# Patient Record
Sex: Female | Born: 1985 | Race: Black or African American | Hispanic: No | Smoking: Current some day smoker
Health system: Southern US, Community
[De-identification: ages and names within clinical notes are randomized; demographics above are authoritative.]

## PROBLEM LIST (undated history)

## (undated) ENCOUNTER — Inpatient Hospital Stay (HOSPITAL_COMMUNITY): Payer: Self-pay

## (undated) DIAGNOSIS — F32A Depression, unspecified: Secondary | ICD-10-CM

## (undated) DIAGNOSIS — K08409 Partial loss of teeth, unspecified cause, unspecified class: Secondary | ICD-10-CM

## (undated) DIAGNOSIS — K8020 Calculus of gallbladder without cholecystitis without obstruction: Secondary | ICD-10-CM

## (undated) DIAGNOSIS — I10 Essential (primary) hypertension: Secondary | ICD-10-CM

## (undated) DIAGNOSIS — G43909 Migraine, unspecified, not intractable, without status migrainosus: Secondary | ICD-10-CM

## (undated) DIAGNOSIS — T8859XA Other complications of anesthesia, initial encounter: Secondary | ICD-10-CM

## (undated) DIAGNOSIS — G932 Benign intracranial hypertension: Secondary | ICD-10-CM

## (undated) DIAGNOSIS — F419 Anxiety disorder, unspecified: Secondary | ICD-10-CM

## (undated) DIAGNOSIS — D649 Anemia, unspecified: Secondary | ICD-10-CM

## (undated) DIAGNOSIS — Z862 Personal history of diseases of the blood and blood-forming organs and certain disorders involving the immune mechanism: Secondary | ICD-10-CM

## (undated) HISTORY — PX: CHOLECYSTECTOMY: SHX55

## (undated) HISTORY — DX: Calculus of gallbladder without cholecystitis without obstruction: K80.20

## (undated) HISTORY — PX: WISDOM TOOTH EXTRACTION: SHX21

---
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
## 2006-10-22 ENCOUNTER — Inpatient Hospital Stay (HOSPITAL_COMMUNITY): Admission: AD | Admit: 2006-10-22 | Discharge: 2006-10-22 | Payer: Self-pay | Admitting: Obstetrics and Gynecology

## 2006-10-25 ENCOUNTER — Inpatient Hospital Stay (HOSPITAL_COMMUNITY): Admission: AD | Admit: 2006-10-25 | Discharge: 2006-10-25 | Payer: Self-pay | Admitting: Obstetrics and Gynecology

## 2006-12-11 ENCOUNTER — Ambulatory Visit (HOSPITAL_COMMUNITY): Admission: RE | Admit: 2006-12-11 | Discharge: 2006-12-11 | Payer: Self-pay | Admitting: Obstetrics & Gynecology

## 2007-02-20 ENCOUNTER — Inpatient Hospital Stay (HOSPITAL_COMMUNITY): Admission: AD | Admit: 2007-02-20 | Discharge: 2007-02-20 | Payer: Self-pay | Admitting: Obstetrics and Gynecology

## 2007-02-27 ENCOUNTER — Ambulatory Visit (HOSPITAL_COMMUNITY): Admission: RE | Admit: 2007-02-27 | Discharge: 2007-02-27 | Payer: Self-pay | Admitting: Obstetrics & Gynecology

## 2007-04-01 ENCOUNTER — Inpatient Hospital Stay (HOSPITAL_COMMUNITY): Admission: AD | Admit: 2007-04-01 | Discharge: 2007-04-01 | Payer: Self-pay | Admitting: Obstetrics & Gynecology

## 2007-04-18 ENCOUNTER — Inpatient Hospital Stay (HOSPITAL_COMMUNITY): Admission: AD | Admit: 2007-04-18 | Discharge: 2007-04-18 | Payer: Self-pay | Admitting: Obstetrics & Gynecology

## 2007-05-09 ENCOUNTER — Inpatient Hospital Stay (HOSPITAL_COMMUNITY): Admission: AD | Admit: 2007-05-09 | Discharge: 2007-05-12 | Payer: Self-pay | Admitting: Obstetrics

## 2007-12-25 ENCOUNTER — Emergency Department (HOSPITAL_COMMUNITY): Admission: EM | Admit: 2007-12-25 | Discharge: 2007-12-25 | Payer: Self-pay | Admitting: Emergency Medicine

## 2008-11-03 ENCOUNTER — Inpatient Hospital Stay (HOSPITAL_COMMUNITY): Admission: AD | Admit: 2008-11-03 | Discharge: 2008-11-03 | Payer: Self-pay | Admitting: Obstetrics & Gynecology

## 2009-03-13 ENCOUNTER — Inpatient Hospital Stay (HOSPITAL_COMMUNITY): Admission: AD | Admit: 2009-03-13 | Discharge: 2009-03-15 | Payer: Self-pay | Admitting: Obstetrics & Gynecology

## 2009-03-13 ENCOUNTER — Ambulatory Visit: Payer: Self-pay | Admitting: Obstetrics & Gynecology

## 2010-03-06 ENCOUNTER — Emergency Department (HOSPITAL_COMMUNITY): Admission: EM | Admit: 2010-03-06 | Discharge: 2010-03-07 | Payer: Self-pay | Admitting: Emergency Medicine

## 2010-04-07 ENCOUNTER — Emergency Department (HOSPITAL_COMMUNITY)
Admission: EM | Admit: 2010-04-07 | Discharge: 2010-04-07 | Payer: Self-pay | Source: Home / Self Care | Admitting: Emergency Medicine

## 2010-07-12 LAB — URINE CULTURE

## 2010-07-12 LAB — URINALYSIS, ROUTINE W REFLEX MICROSCOPIC
Ketones, ur: NEGATIVE mg/dL
Nitrite: NEGATIVE
Protein, ur: NEGATIVE mg/dL
Specific Gravity, Urine: 1.035 — ABNORMAL HIGH (ref 1.005–1.030)
pH: 6 (ref 5.0–8.0)

## 2010-07-12 LAB — URINE MICROSCOPIC-ADD ON

## 2010-07-19 ENCOUNTER — Emergency Department (HOSPITAL_COMMUNITY): Payer: Medicaid Other

## 2010-07-19 ENCOUNTER — Emergency Department (HOSPITAL_COMMUNITY)
Admission: EM | Admit: 2010-07-19 | Discharge: 2010-07-20 | Disposition: A | Discharge status: Home / Self Care | Payer: Medicaid Other | Attending: Emergency Medicine | Admitting: Emergency Medicine

## 2010-07-19 DIAGNOSIS — S81009A Unspecified open wound, unspecified knee, initial encounter: Secondary | ICD-10-CM | POA: Insufficient documentation

## 2010-07-19 DIAGNOSIS — L02419 Cutaneous abscess of limb, unspecified: Secondary | ICD-10-CM | POA: Insufficient documentation

## 2010-07-19 DIAGNOSIS — IMO0001 Reserved for inherently not codable concepts without codable children: Secondary | ICD-10-CM | POA: Insufficient documentation

## 2010-07-19 DIAGNOSIS — L03119 Cellulitis of unspecified part of limb: Secondary | ICD-10-CM | POA: Insufficient documentation

## 2010-07-19 DIAGNOSIS — Y9289 Other specified places as the place of occurrence of the external cause: Secondary | ICD-10-CM | POA: Insufficient documentation

## 2010-08-03 LAB — CBC
HCT: 30 % — ABNORMAL LOW (ref 36.0–46.0)
HCT: 32.4 % — ABNORMAL LOW (ref 36.0–46.0)
Hemoglobin: 9.9 g/dL — ABNORMAL LOW (ref 12.0–15.0)
MCV: 82 fL (ref 78.0–100.0)
MCV: 83 fL (ref 78.0–100.0)
RBC: 3.62 MIL/uL — ABNORMAL LOW (ref 3.87–5.11)
WBC: 10.7 10*3/uL — ABNORMAL HIGH (ref 4.0–10.5)
WBC: 9.1 10*3/uL (ref 4.0–10.5)

## 2010-08-03 LAB — CARDIAC PANEL(CRET KIN+CKTOT+MB+TROPI)
CK, MB: 2 ng/mL (ref 0.3–4.0)
Relative Index: 1.7 (ref 0.0–2.5)
Relative Index: 2.5 (ref 0.0–2.5)
Total CK: 117 U/L (ref 7–177)
Troponin I: 0.03 ng/mL (ref 0.00–0.06)
Troponin I: 0.06 ng/mL (ref 0.00–0.06)

## 2010-08-03 LAB — RPR: RPR Ser Ql: NONREACTIVE

## 2010-08-07 LAB — POCT PREGNANCY, URINE: Preg Test, Ur: POSITIVE

## 2010-08-07 LAB — WET PREP, GENITAL: Yeast Wet Prep HPF POC: NONE SEEN

## 2010-08-07 LAB — GC/CHLAMYDIA PROBE AMP, GENITAL: Chlamydia, DNA Probe: POSITIVE — AB

## 2010-08-26 ENCOUNTER — Observation Stay (HOSPITAL_COMMUNITY)
Admission: EM | Admit: 2010-08-26 | Discharge: 2010-08-27 | Disposition: A | Discharge status: Home / Self Care | Payer: Medicaid Other | Source: Ambulatory Visit | Attending: Emergency Medicine | Admitting: Emergency Medicine

## 2010-08-26 DIAGNOSIS — IMO0001 Reserved for inherently not codable concepts without codable children: Secondary | ICD-10-CM | POA: Insufficient documentation

## 2010-08-26 DIAGNOSIS — F329 Major depressive disorder, single episode, unspecified: Secondary | ICD-10-CM | POA: Insufficient documentation

## 2010-08-26 DIAGNOSIS — L02419 Cutaneous abscess of limb, unspecified: Principal | ICD-10-CM | POA: Insufficient documentation

## 2010-08-26 DIAGNOSIS — I10 Essential (primary) hypertension: Secondary | ICD-10-CM | POA: Insufficient documentation

## 2010-08-26 DIAGNOSIS — L03119 Cellulitis of unspecified part of limb: Secondary | ICD-10-CM | POA: Insufficient documentation

## 2010-08-26 DIAGNOSIS — F1010 Alcohol abuse, uncomplicated: Secondary | ICD-10-CM | POA: Insufficient documentation

## 2010-08-26 DIAGNOSIS — F3289 Other specified depressive episodes: Secondary | ICD-10-CM | POA: Insufficient documentation

## 2010-08-26 LAB — DIFFERENTIAL
Lymphs Abs: 3.7 10*3/uL (ref 0.7–4.0)
Monocytes Absolute: 0.6 10*3/uL (ref 0.1–1.0)
Neutro Abs: 6.5 10*3/uL (ref 1.7–7.7)
Neutrophils Relative %: 59 % (ref 43–77)

## 2010-08-26 LAB — POCT I-STAT, CHEM 8
Creatinine, Ser: 0.8 mg/dL (ref 0.4–1.2)
HCT: 34 % — ABNORMAL LOW (ref 36.0–46.0)
Hemoglobin: 11.6 g/dL — ABNORMAL LOW (ref 12.0–15.0)
Potassium: 3.6 mEq/L (ref 3.5–5.1)
TCO2: 25 mmol/L (ref 0–100)

## 2010-08-26 LAB — CBC
MCH: 27 pg (ref 26.0–34.0)
MCHC: 33.5 g/dL (ref 30.0–36.0)
MCV: 80.6 fL (ref 78.0–100.0)
Platelets: 254 10*3/uL (ref 150–400)
RDW: 13.9 % (ref 11.5–15.5)
WBC: 10.9 10*3/uL — ABNORMAL HIGH (ref 4.0–10.5)

## 2010-08-29 LAB — WOUND CULTURE: Gram Stain: NONE SEEN

## 2010-08-30 ENCOUNTER — Emergency Department (HOSPITAL_COMMUNITY)
Admission: EM | Admit: 2010-08-30 | Discharge: 2010-08-30 | Disposition: A | Discharge status: Home / Self Care | Payer: Medicaid Other | Attending: Emergency Medicine | Admitting: Emergency Medicine

## 2010-08-30 DIAGNOSIS — IMO0001 Reserved for inherently not codable concepts without codable children: Secondary | ICD-10-CM | POA: Insufficient documentation

## 2010-08-30 DIAGNOSIS — L089 Local infection of the skin and subcutaneous tissue, unspecified: Secondary | ICD-10-CM | POA: Insufficient documentation

## 2010-08-30 DIAGNOSIS — T148XXA Other injury of unspecified body region, initial encounter: Secondary | ICD-10-CM | POA: Insufficient documentation

## 2010-09-16 NOTE — Discharge Summary (Signed)
NAME:  Tabitha Holland, Tabitha Holland                      ACCOUNT NO.:  [id number]   MEDICAL RECORD NO.:  [id number]                   PATIENT TYPE:  INP   LOCATION:  0104                                 FACILITY:  [facility]   PHYSICIAN:  Cindie Crumbly, M.D.               DATE OF BIRTH:  January 28, 1986   DATE OF ADMISSION:  08/29/2002  DATE OF DISCHARGE:  09/05/2002                                 DISCHARGE SUMMARY   REASON FOR ADMISSION:  This 25 year old African-American female was admitted  for inpatient psychiatric stabilization because of increasing symptoms of  depression and threats to kill herself.  For further history of present  illness, please the patient's psychiatric admission assessment.   PHYSICAL EXAMINATION:  At the time of admission was significant for obesity.  She also has a history of being status post cholecystectomy.  She had an  otherwise unremarkable physical examination.   LABORATORY DATA:  The patient underwent a laboratory workup to rule out any  medical problems contributing to her symptomatology.  Hepatic panel showed  albumin 3.2 and was otherwise unremarkable.  A urine probe for gonorrhea and  chlamydia were negative.  Basic metabolic panel was within normal limits.  CBC showed RDW of 14.8% and was otherwise unremarkable.  Free T4 and TSH  were within normal limits and RPR was nonreactive.  GGT was within normal  limits.  Urine drug screen was positive for barbiturates.  Urine pregnancy  test was negative.  The patient received no x-rays, no special procedures,  no additional consultations.  She sustained no complications during the  course of this hospitalization.   HOSPITAL COURSE:  On admission, the patient was psychomotor agitated.  Affect and mood were depressed, irritable and angry.  She was oppositional  and defiant with poor impulse control.  She was begun on a trial of Effexor  XR and titrated up to a therapeutic dose.  She had shown no improvement on  Zoloft.  This was downward and discontinued.  At the time of discharge, she  denies any suicidal or homicidal ideation.  Affect and mood have improved.  Her concentration is increased.  She is actively participating in all  aspects of the therapeutic treatment program.  Consequently, it is felt she  has reached her maximum benefits of hospitalization and is ready for  discharge to a less restrictive alternative setting.   CONDITION ON DISCHARGE:  Improved.  She has displayed no evidence of thought  disorder throughout her hospital course.   DIAGNOSES (ACCORDING TO DSM-IV):   AXIS I:  Major depression, recurrent-type, severe without psychosis.   AXIS II:  None.   AXIS III:  Morbid obesity.   AXIS IV:  Current psychosocial stressors are severe.   AXIS V:  20 on admission; 30 on discharge.   FURTHER EVALUATION AND TREATMENT RECOMMENDATIONS:  1. The patient is discharged home.  2. She is discharged on an unrestricted level of activity  and a regular     diet.  3. She is discharged on Effexor XR 150 mg p.o. q.a.m.  4. She will follow up at the [facility] with her outpatient     psychiatrist, Dr. Kirtland Bouchard, for all further aspects of her psychiatric     care and, consequently, I will sign off on the case at this time.  She     will follow up with her primary care physician for all further aspects of     her medical care.                                               Cindie Crumbly, M.D.    TS/MEDQ  D:  09/05/2002  T:  09/05/2002  Job:  [id number]
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
## 2010-09-16 NOTE — Op Note (Signed)
[facility]  Patient:    Tabitha Holland, Tabitha Holland                   MRN: [id number] Proc. Date: 11/27/00 Adm. Date:  [id number] Attending:  Tempie Donning                           Operative Report  PREOPERATIVE DIAGNOSIS:  Cholecystitis, sludge, no stones on ultrasound.  POSTOPERATIVE DIAGNOSIS:  Cholecystitis, sludge, no stones on ultrasound.  OPERATION PERFORMED:  Laparoscopic cholecystectomy.  SURGEON:  Gita Kudo, M.D.  ASSISTANT:  Earna Coder, M.D.  ANESTHESIA:  General endotracheal.  INDICATIONS FOR PROCEDURE:  The patient is a 25 year old female with bouts of abdominal pain.  Gallbladder ultrasound showed stones and possible polyps. Her liver function studies are normal.  They do not know her family history because she is adopted.  OPERATIVE FINDINGS:  The gallbladder was thin-walled, had no palpable stones. Cystic duct and artery were normal in size and anatomy and cholangiogram not taken.  DESCRIPTION OF PROCEDURE:  Under satisfactory general endotracheal anesthesia, having received 1.0 gm Ancef preop, the patients abdomen was prepped and draped in standard fashion.  A transverse infraumbilical incision was made and carried down to the midline into the peritoneum.  Controlled with a figure-of-eight 0 Vicryl suture and operating Hasson inserted and secured with CO2 pneumoperitoneum established and camera placed.  Under direct vision, with Marcaine infiltrated skin sites, two #5 ports placed laterally and a second #10 port medially.  Graspers through the lateral port gave excellent exposure and operating through the medial port, I carefully dissected the gallbladder cystic duct junction.  When certain of the anatomy, having dissected with a wide angle clamp circumferentially, the duct was controlled with multiple metal clips and divided between the distal two.  The same procedure done on the cystic artery.  The gallbladder  then removed from below upward using coagulating spatula for hemostasis and dissection.  After the gallbladder was amputated, the liver bed was checked for hemostasis which was good and it was lavaged with saline and suctioned dry.  The camera moved to the upper port and large grasper placed through the umbilical port and used to retrieve the gallbladder intact, without spillage or complication.  The operative site was again checked and then trocars removed under direct vision and CO2 released.  Following this, the midline incision was infiltrated with Marcaine and then closed with a previously placed figure-of-eight suture as well as two extra interrupted 0 Vicryl sutures.  The subcutaneous was reapproximated with 4-0 Vicryl and skin with Steri-Strips.  Sterile absorbent dressings applied.  The patient was taken to the recovery room from the operating room in good condition without complication. DD:  11/27/00 TD:  11/27/00 Job: [id number]

## 2010-10-15 ENCOUNTER — Emergency Department (HOSPITAL_COMMUNITY)
Admission: EM | Admit: 2010-10-15 | Discharge: 2010-10-15 | Disposition: A | Discharge status: Home / Self Care | Payer: No Typology Code available for payment source | Attending: Emergency Medicine | Admitting: Emergency Medicine

## 2010-10-15 DIAGNOSIS — IMO0002 Reserved for concepts with insufficient information to code with codable children: Secondary | ICD-10-CM | POA: Insufficient documentation

## 2010-10-15 DIAGNOSIS — I10 Essential (primary) hypertension: Secondary | ICD-10-CM | POA: Insufficient documentation

## 2011-01-19 LAB — CBC
HCT: 32.7 — ABNORMAL LOW
Hemoglobin: 10.3 — ABNORMAL LOW
MCHC: 34.9
MCV: 86.8
Platelets: 212
RDW: 14.9
RDW: 15.2

## 2011-01-19 LAB — COMPREHENSIVE METABOLIC PANEL
Alkaline Phosphatase: 177 — ABNORMAL HIGH
BUN: 6
Calcium: 8.8
Creatinine, Ser: 0.52
Glucose, Bld: 89
Potassium: 3.3 — ABNORMAL LOW
Total Protein: 5.9 — ABNORMAL LOW

## 2011-01-19 LAB — LACTATE DEHYDROGENASE: LDH: 130

## 2011-01-19 LAB — URIC ACID: Uric Acid, Serum: 4.4

## 2011-01-28 ENCOUNTER — Emergency Department (HOSPITAL_COMMUNITY)
Admission: EM | Admit: 2011-01-28 | Discharge: 2011-01-28 | Disposition: A | Discharge status: Home / Self Care | Payer: Medicaid Other | Attending: Emergency Medicine | Admitting: Emergency Medicine

## 2011-01-28 ENCOUNTER — Emergency Department (HOSPITAL_COMMUNITY): Payer: Medicaid Other

## 2011-01-28 DIAGNOSIS — R0609 Other forms of dyspnea: Secondary | ICD-10-CM | POA: Insufficient documentation

## 2011-01-28 DIAGNOSIS — R079 Chest pain, unspecified: Secondary | ICD-10-CM | POA: Insufficient documentation

## 2011-01-28 DIAGNOSIS — J40 Bronchitis, not specified as acute or chronic: Secondary | ICD-10-CM | POA: Insufficient documentation

## 2011-01-28 DIAGNOSIS — R0989 Other specified symptoms and signs involving the circulatory and respiratory systems: Secondary | ICD-10-CM | POA: Insufficient documentation

## 2011-01-28 DIAGNOSIS — R05 Cough: Secondary | ICD-10-CM | POA: Insufficient documentation

## 2011-01-28 DIAGNOSIS — R197 Diarrhea, unspecified: Secondary | ICD-10-CM | POA: Insufficient documentation

## 2011-01-28 DIAGNOSIS — M940 Chondrocostal junction syndrome [Tietze]: Secondary | ICD-10-CM | POA: Insufficient documentation

## 2011-01-28 DIAGNOSIS — R509 Fever, unspecified: Secondary | ICD-10-CM | POA: Insufficient documentation

## 2011-01-28 DIAGNOSIS — R059 Cough, unspecified: Secondary | ICD-10-CM | POA: Insufficient documentation

## 2011-01-28 DIAGNOSIS — E669 Obesity, unspecified: Secondary | ICD-10-CM | POA: Insufficient documentation

## 2011-01-28 LAB — DIFFERENTIAL
Lymphocytes Relative: 33 % (ref 12–46)
Lymphs Abs: 2.8 10*3/uL (ref 0.7–4.0)
Neutrophils Relative %: 60 % (ref 43–77)

## 2011-01-28 LAB — POCT I-STAT, CHEM 8
Chloride: 103 mEq/L (ref 96–112)
Glucose, Bld: 91 mg/dL (ref 70–99)
HCT: 32 % — ABNORMAL LOW (ref 36.0–46.0)
Potassium: 3.6 mEq/L (ref 3.5–5.1)

## 2011-01-28 LAB — CBC
HCT: 30.6 % — ABNORMAL LOW (ref 36.0–46.0)
Hemoglobin: 10.2 g/dL — ABNORMAL LOW (ref 12.0–15.0)
MCV: 81.8 fL (ref 78.0–100.0)
RBC: 3.74 MIL/uL — ABNORMAL LOW (ref 3.87–5.11)
WBC: 8.3 10*3/uL (ref 4.0–10.5)

## 2011-02-06 LAB — URINE MICROSCOPIC-ADD ON

## 2011-02-06 LAB — URINALYSIS, ROUTINE W REFLEX MICROSCOPIC
Glucose, UA: NEGATIVE
Hgb urine dipstick: NEGATIVE
Specific Gravity, Urine: 1.02

## 2011-02-08 LAB — URINE CULTURE

## 2011-02-08 LAB — URINE MICROSCOPIC-ADD ON

## 2011-02-08 LAB — URINALYSIS, ROUTINE W REFLEX MICROSCOPIC
Bilirubin Urine: NEGATIVE
Glucose, UA: NEGATIVE
Ketones, ur: NEGATIVE
Nitrite: NEGATIVE
Protein, ur: NEGATIVE

## 2011-02-15 LAB — WET PREP, GENITAL: Trich, Wet Prep: NONE SEEN

## 2011-02-15 LAB — CBC
HCT: 34.8 — ABNORMAL LOW
Hemoglobin: 11.8 — ABNORMAL LOW
MCHC: 33.9
MCV: 83.1
Platelets: 230
RDW: 15.6 — ABNORMAL HIGH

## 2011-02-15 LAB — URINALYSIS, ROUTINE W REFLEX MICROSCOPIC
Bilirubin Urine: NEGATIVE
Glucose, UA: NEGATIVE
Ketones, ur: NEGATIVE
Protein, ur: NEGATIVE

## 2011-02-15 LAB — GC/CHLAMYDIA PROBE AMP, GENITAL
Chlamydia, DNA Probe: POSITIVE — AB
GC Probe Amp, Genital: NEGATIVE

## 2011-02-15 LAB — URINE MICROSCOPIC-ADD ON

## 2011-02-15 LAB — HCG, QUANTITATIVE, PREGNANCY: hCG, Beta Chain, Quant, S: 44382 — ABNORMAL HIGH

## 2011-08-27 ENCOUNTER — Emergency Department (HOSPITAL_COMMUNITY): Payer: No Typology Code available for payment source

## 2011-08-27 ENCOUNTER — Emergency Department (HOSPITAL_COMMUNITY)
Admission: EM | Admit: 2011-08-27 | Discharge: 2011-08-28 | Disposition: A | Discharge status: Home / Self Care | Payer: No Typology Code available for payment source | Attending: Emergency Medicine | Admitting: Emergency Medicine

## 2011-08-27 ENCOUNTER — Encounter (HOSPITAL_COMMUNITY): Payer: Self-pay

## 2011-08-27 DIAGNOSIS — M542 Cervicalgia: Secondary | ICD-10-CM | POA: Insufficient documentation

## 2011-08-27 DIAGNOSIS — T07XXXA Unspecified multiple injuries, initial encounter: Secondary | ICD-10-CM | POA: Insufficient documentation

## 2011-08-27 DIAGNOSIS — R079 Chest pain, unspecified: Secondary | ICD-10-CM | POA: Insufficient documentation

## 2011-08-27 DIAGNOSIS — M25552 Pain in left hip: Secondary | ICD-10-CM

## 2011-08-27 DIAGNOSIS — R109 Unspecified abdominal pain: Secondary | ICD-10-CM | POA: Insufficient documentation

## 2011-08-27 DIAGNOSIS — R0789 Other chest pain: Secondary | ICD-10-CM

## 2011-08-27 DIAGNOSIS — M25559 Pain in unspecified hip: Secondary | ICD-10-CM | POA: Insufficient documentation

## 2011-08-27 DIAGNOSIS — I10 Essential (primary) hypertension: Secondary | ICD-10-CM | POA: Insufficient documentation

## 2011-08-27 HISTORY — DX: Essential (primary) hypertension: I10

## 2011-08-27 LAB — COMPREHENSIVE METABOLIC PANEL
ALT: 29 U/L (ref 0–35)
AST: 20 U/L (ref 0–37)
Albumin: 3.4 g/dL — ABNORMAL LOW (ref 3.5–5.2)
Alkaline Phosphatase: 103 U/L (ref 39–117)
Calcium: 9.5 mg/dL (ref 8.4–10.5)
GFR calc Af Amer: >90 mL/min (ref >90)
Potassium: 3.6 mEq/L (ref 3.5–5.1)
Sodium: 137 mEq/L (ref 135–145)
Total Protein: 7.4 g/dL (ref 6.0–8.3)

## 2011-08-27 LAB — CBC
MCH: 27.6 pg (ref 26.0–34.0)
MCV: 80.6 fL (ref 78.0–100.0)
Platelets: 216 10*3/uL (ref 150–400)
RBC: 4.38 MIL/uL (ref 3.87–5.11)
RDW: 14.4 % (ref 11.5–15.5)
WBC: 11.8 10*3/uL — ABNORMAL HIGH (ref 4.0–10.5)

## 2011-08-27 LAB — DIFFERENTIAL
Basophils Absolute: 0 10*3/uL (ref 0.0–0.1)
Eosinophils Absolute: 0.1 10*3/uL (ref 0.0–0.7)
Eosinophils Relative: 1 % (ref 0–5)
Neutrophils Relative %: 68 % (ref 43–77)

## 2011-08-27 MED ORDER — HYDROMORPHONE HCL PF 1 MG/ML IJ SOLN
1.0000 mg | Freq: Once | INTRAMUSCULAR | Status: AC
  Administered 2011-08-27: 1 mg via INTRAVENOUS
  Filled 2011-08-27: qty 1

## 2011-08-27 MED ORDER — OXYCODONE-ACETAMINOPHEN 5-325 MG PO TABS
2.0000 | ORAL_TABLET | Freq: Once | ORAL | Status: AC
  Administered 2011-08-28: 2 via ORAL
  Filled 2011-08-27: qty 2

## 2011-08-27 MED ORDER — CYCLOBENZAPRINE HCL 10 MG PO TABS
5.0000 mg | ORAL_TABLET | Freq: Once | ORAL | Status: AC
  Administered 2011-08-28: 5 mg via ORAL
  Filled 2011-08-27: qty 1
  Filled 2011-08-27: qty 2

## 2011-08-27 MED ORDER — KETOROLAC TROMETHAMINE 30 MG/ML IJ SOLN
30.0000 mg | Freq: Once | INTRAMUSCULAR | Status: AC
  Administered 2011-08-28: 30 mg via INTRAVENOUS
  Filled 2011-08-27: qty 1

## 2011-08-27 MED ORDER — ONDANSETRON HCL 4 MG/2ML IJ SOLN
4.0000 mg | Freq: Once | INTRAMUSCULAR | Status: AC
  Administered 2011-08-27: 4 mg via INTRAVENOUS
  Filled 2011-08-27: qty 2

## 2011-08-27 MED ORDER — SODIUM CHLORIDE 0.9 % IV SOLN
Freq: Once | INTRAVENOUS | Status: AC
  Administered 2011-08-27: 23:00:00 via INTRAVENOUS
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
## 2011-08-28 MED ORDER — OXYCODONE-ACETAMINOPHEN 5-325 MG PO TABS: 2.0000 | ORAL_TABLET | Freq: Four times a day (QID) | ORAL | Status: DC | PRN

## 2011-08-28 MED ORDER — CYCLOBENZAPRINE HCL 5 MG PO TABS: 5.0000 mg | ORAL_TABLET | Freq: Once | ORAL | Status: DC

## 2011-08-28 NOTE — ED Notes (Signed)
Rx x2 given

## 2011-08-28 NOTE — ED Provider Notes (Signed)
Medical screening examination/treatment/procedure(s) were performed by non-physician practitioner and as supervising physician I was immediately available for consultation/collaboration.   Celene Kras, MD 08/28/11 [phone]

## 2011-08-28 NOTE — Discharge Instructions (Signed)
As discussed on your x-rays are negative.  Please try to ambulate and move around as much as possible over the next couple days

## 2011-08-31 ENCOUNTER — Emergency Department (HOSPITAL_BASED_OUTPATIENT_CLINIC_OR_DEPARTMENT_OTHER)
Admission: EM | Admit: 2011-08-31 | Discharge: 2011-08-31 | Disposition: A | Discharge status: Home / Self Care | Payer: No Typology Code available for payment source | Attending: Emergency Medicine | Admitting: Emergency Medicine

## 2011-08-31 ENCOUNTER — Emergency Department (INDEPENDENT_AMBULATORY_CARE_PROVIDER_SITE_OTHER): Payer: No Typology Code available for payment source

## 2011-08-31 ENCOUNTER — Encounter (HOSPITAL_BASED_OUTPATIENT_CLINIC_OR_DEPARTMENT_OTHER): Payer: Self-pay | Admitting: Emergency Medicine

## 2011-08-31 DIAGNOSIS — R42 Dizziness and giddiness: Secondary | ICD-10-CM | POA: Insufficient documentation

## 2011-08-31 DIAGNOSIS — R110 Nausea: Secondary | ICD-10-CM | POA: Insufficient documentation

## 2011-08-31 DIAGNOSIS — R55 Syncope and collapse: Secondary | ICD-10-CM | POA: Insufficient documentation

## 2011-08-31 DIAGNOSIS — S060X9A Concussion with loss of consciousness of unspecified duration, initial encounter: Secondary | ICD-10-CM | POA: Insufficient documentation

## 2011-08-31 DIAGNOSIS — I10 Essential (primary) hypertension: Secondary | ICD-10-CM | POA: Insufficient documentation

## 2011-08-31 DIAGNOSIS — S060XAA Concussion with loss of consciousness status unknown, initial encounter: Secondary | ICD-10-CM | POA: Insufficient documentation

## 2011-08-31 DIAGNOSIS — Y92410 Unspecified street and highway as the place of occurrence of the external cause: Secondary | ICD-10-CM | POA: Insufficient documentation

## 2011-08-31 DIAGNOSIS — R404 Transient alteration of awareness: Secondary | ICD-10-CM
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
## 2011-08-31 NOTE — Discharge Instructions (Signed)
Concussion and Brain Injury A blow or jolt to the head can disrupt the normal function of the brain. This type of brain injury is often called a "concussion" or a "closed head injury." Concussions are usually not life-threatening. Even so, the effects of a concussion can be serious.  CAUSES  A concussion is caused by a blunt blow to the head. The blow might be direct or indirect as described below.  Direct blow (running into another player during a soccer game, being hit in a fight, or hitting your head on a hard surface).   Indirect blow (when your head moves rapidly and violently back and forth like in a car crash).  SYMPTOMS  The brain is very complex. Every head injury is different. Some symptoms may appear right away. Other symptoms may not show up for days or weeks after the concussion. The signs of concussion can be hard to notice. Early on, problems may be missed by patients, family members, and caregivers. You may look fine even though you are acting or feeling differently.  These symptoms are usually temporary, but may last for days, weeks, or even longer. Symptoms include:  Mild headaches that will not go away.   Having more trouble than usual with:   Remembering things.   Paying attention or concentrating.   Organizing daily tasks.   Making decisions and solving problems.   Slowness in thinking, acting, speaking, or reading.   Getting lost or easily confused.   Feeling tired all the time or lacking energy (fatigue).   Feeling drowsy.   Sleep disturbances.   Sleeping more than usual.   Sleeping less than usual.   Trouble falling asleep.   Trouble sleeping (insomnia).   Loss of balance or feeling lightheaded or dizzy.   Nausea or vomiting.   Numbness or tingling.   Increased sensitivity to:   Sounds.   Lights.   Distractions.  Other symptoms might include:  Vision problems or eyes that tire easily.   Diminished sense of taste or smell.   Ringing  in the ears.   Mood changes such as feeling sad, anxious, or listless.   Becoming easily irritated or angry for little or no reason.   Lack of motivation.  DIAGNOSIS  Your caregiver can usually diagnose a concussion or mild brain injury based on your description of your injury and your symptoms.  Your evaluation might include:  A brain scan to look for signs of injury to the brain. Even if the test shows no injury, you may still have a concussion.   Blood tests to be sure other problems are not present.  TREATMENT   People with a concussion need to be examined and evaluated. Most people with concussions are treated in an emergency department, urgent care, or clinic. Some people must stay in the hospital overnight for further treatment.   Your caregiver will send you home with important instructions to follow. Be sure to carefully follow them.   Tell your caregiver if you are already taking any medicines (prescription, over-the-counter, or natural remedies), or if you are drinking alcohol or taking illegal drugs. Also, talk with your caregiver if you are taking blood thinners (anticoagulants) or aspirin. These drugs may increase your chances of complications. All of this is important information that may affect treatment.   Only take over-the-counter or prescription medicines for pain, discomfort, or fever as directed by your caregiver.  PROGNOSIS  How fast people recover from brain injury varies from person to person.   Although most people have a good recovery, how quickly they improve depends on many factors. These factors include how severe their concussion was, what part of the brain was injured, their age, and how healthy they were before the concussion.  Because all head injuries are different, so is recovery. Most people with mild injuries recover fully. Recovery can take time. In general, recovery is slower in older persons. Also, persons who have had a concussion in the past or have  other medical problems may find that it takes longer to recover from their current injury. Anxiety and depression may also make it harder to adjust to the symptoms of brain injury. HOME CARE INSTRUCTIONS  Return to your normal activities slowly, not all at once. You must give your body and brain enough time for recovery.  Get plenty of sleep at night, and rest during the day. Rest helps the brain to heal.   Avoid staying up late at night.   Keep the same bedtime hours on weekends and weekdays.   Take daytime naps or rest breaks when you feel tired.   Limit activities that require a lot of thought or concentration (brain or cognitive rest). This includes:   Homework or job-related work.   Watching TV.   Computer work.   Avoid activities that could lead to a second brain injury, such as contact or recreational sports, until your caregiver says it is okay. Even after your brain injury has healed, you should protect yourself from having another concussion.   Ask your caregiver when you can return to your normal activities such as driving, bicycling, or operating heavy equipment. Your ability to react may be slower after a brain injury.   Talk with your caregiver about when you can return to work or school.   Inform your teachers, school nurse, school counselor, coach, athletic trainer, or work manager about your injury, symptoms, and restrictions. They should be instructed to report:   Increased problems with attention or concentration.   Increased problems remembering or learning new information.   Increased time needed to complete tasks or assignments.   Increased irritability or decreased ability to cope with stress.   Increased symptoms.   Take only those medicines that your caregiver has approved.   Do not drink alcohol until your caregiver says you are well enough to do so. Alcohol and certain other drugs may slow your recovery and can put you at risk of further injury.    If it is harder than usual to remember things, write them down.   If you are easily distracted, try to do one thing at a time. For example, do not try to watch TV while fixing dinner.   Talk with family members or close friends when making important decisions.   Keep all follow-up appointments. Repeated evaluation of your symptoms is recommended for your recovery.  PREVENTION  Protect your head from future injury. It is very important to avoid another head or brain injury before you have recovered. In rare cases, another injury has lead to permanent brain damage, brain swelling, or death. Avoid injuries by using:  Seatbelts when riding in a car.   Alcohol only in moderation.   A helmet when biking, skiing, skateboarding, skating, or doing similar activities.   Safety measures in your home.   Remove clutter and tripping hazards from floors and stairways.   Use grab bars in bathrooms and handrails by stairs.   Place non-slip mats on floors and in bathtubs.     Improve lighting in dim areas.  SEEK MEDICAL CARE IF:  A head injury can cause lingering symptoms. You should seek medical care if you have any of the following symptoms for more than 3 weeks after your injury or are planning to return to sports:  Chronic headaches.   Dizziness or balance problems.   Nausea.   Vision problems.   Increased sensitivity to noise or light.   Depression or mood swings.   Anxiety or irritability.   Memory problems.   Difficulty concentrating or paying attention.   Sleep problems.   Feeling tired all the time.  SEEK IMMEDIATE MEDICAL CARE IF:  You have had a blow or jolt to the head and you (or your family or friends) notice:  Severe or worsening headaches.   Weakness (even if only in one hand or one leg or one part of the face), numbness, or decreased coordination.   Repeated vomiting.   Increased sleepiness or passing out.   One black center of the eye (pupil) is larger  than the other.   Convulsions (seizures).   Slurred speech.   Increasing confusion, restlessness, agitation, or irritability.   Lack of ability to recognize people or places.   Neck pain.   Difficulty being awakened.   Unusual behavior changes.   Loss of consciousness.  Older adults with a brain injury may have a higher risk of serious complications such as a blood clot on the brain. Headaches that get worse or an increase in confusion are signs of this complication. If these signs occur, see a caregiver right away. MAKE SURE YOU:   Understand these instructions.   Will watch your condition.   Will get help right away if you are not doing well or get worse.  FOR MORE INFORMATION  Several groups help people with brain injury and their families. They provide information and put people in touch with local resources. These include support groups, rehabilitation services, and a variety of health care professionals. Among these groups, the Brain Injury Association (BIA, www.biausa.org) has a national office that gathers scientific and educational information and works on a national level to help people with brain injury.  Document Released: 07/08/2003 Document Revised: 04/06/2011 Document Reviewed: 12/04/2007 [employer] Patient Information 2012 [employer]. 
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
## 2011-09-05 ENCOUNTER — Emergency Department (HOSPITAL_COMMUNITY)
Admission: EM | Admit: 2011-09-05 | Discharge: 2011-09-05 | Disposition: A | Discharge status: Home / Self Care | Payer: No Typology Code available for payment source | Attending: Emergency Medicine | Admitting: Emergency Medicine

## 2011-09-05 ENCOUNTER — Encounter (HOSPITAL_COMMUNITY): Payer: Self-pay | Admitting: *Deleted

## 2011-09-05 DIAGNOSIS — F17200 Nicotine dependence, unspecified, uncomplicated: Secondary | ICD-10-CM | POA: Insufficient documentation

## 2011-09-05 DIAGNOSIS — I10 Essential (primary) hypertension: Secondary | ICD-10-CM | POA: Insufficient documentation

## 2011-09-05 DIAGNOSIS — F0781 Postconcussional syndrome: Secondary | ICD-10-CM

## 2011-09-05 MED ORDER — HYDROCODONE-ACETAMINOPHEN 5-325 MG PO TABS: 1.0000 | ORAL_TABLET | ORAL | Status: AC | PRN

## 2011-09-05 MED ORDER — LORAZEPAM 1 MG PO TABS: 1.0000 mg | ORAL_TABLET | Freq: Three times a day (TID) | ORAL | Status: AC | PRN

## 2011-09-05 MED ORDER — MECLIZINE HCL 25 MG PO TABS: 25.0000 mg | ORAL_TABLET | Freq: Three times a day (TID) | ORAL | Status: AC | PRN
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
## 2011-09-05 NOTE — Discharge Instructions (Signed)
Concussion and Brain Injury A blow or jolt to the head can disrupt the normal function of the brain. This type of brain injury is often called a "concussion" or a "closed head injury." Concussions are usually not life-threatening. Even so, the effects of a concussion can be serious.  CAUSES  A concussion is caused by a blunt blow to the head. The blow might be direct or indirect as described below.  Direct blow (running into another player during a soccer game, being hit in a fight, or hitting your head on a hard surface).   Indirect blow (when your head moves rapidly and violently back and forth like in a car crash).  SYMPTOMS  The brain is very complex. Every head injury is different. Some symptoms may appear right away. Other symptoms may not show up for days or weeks after the concussion. The signs of concussion can be hard to notice. Early on, problems may be missed by patients, family members, and caregivers. You may look fine even though you are acting or feeling differently.  These symptoms are usually temporary, but may last for days, weeks, or even longer. Symptoms include:  Mild headaches that will not go away.   Having more trouble than usual with:   Remembering things.   Paying attention or concentrating.   Organizing daily tasks.   Making decisions and solving problems.   Slowness in thinking, acting, speaking, or reading.   Getting lost or easily confused.   Feeling tired all the time or lacking energy (fatigue).   Feeling drowsy.   Sleep disturbances.   Sleeping more than usual.   Sleeping less than usual.   Trouble falling asleep.   Trouble sleeping (insomnia).   Loss of balance or feeling lightheaded or dizzy.   Nausea or vomiting.   Numbness or tingling.   Increased sensitivity to:   Sounds.   Lights.   Distractions.  Other symptoms might include:  Vision problems or eyes that tire easily.   Diminished sense of taste or smell.   Ringing  in the ears.   Mood changes such as feeling sad, anxious, or listless.   Becoming easily irritated or angry for little or no reason.   Lack of motivation.  DIAGNOSIS  Your caregiver can usually diagnose a concussion or mild brain injury based on your description of your injury and your symptoms.  Your evaluation might include:  A brain scan to look for signs of injury to the brain. Even if the test shows no injury, you may still have a concussion.   Blood tests to be sure other problems are not present.  TREATMENT   People with a concussion need to be examined and evaluated. Most people with concussions are treated in an emergency department, urgent care, or clinic. Some people must stay in the hospital overnight for further treatment.   Your caregiver will send you home with important instructions to follow. Be sure to carefully follow them.   Tell your caregiver if you are already taking any medicines (prescription, over-the-counter, or natural remedies), or if you are drinking alcohol or taking illegal drugs. Also, talk with your caregiver if you are taking blood thinners (anticoagulants) or aspirin. These drugs may increase your chances of complications. All of this is important information that may affect treatment.   Only take over-the-counter or prescription medicines for pain, discomfort, or fever as directed by your caregiver.  PROGNOSIS  How fast people recover from brain injury varies from person to person.   Although most people have a good recovery, how quickly they improve depends on many factors. These factors include how severe their concussion was, what part of the brain was injured, their age, and how healthy they were before the concussion.  Because all head injuries are different, so is recovery. Most people with mild injuries recover fully. Recovery can take time. In general, recovery is slower in older persons. Also, persons who have had a concussion in the past or have  other medical problems may find that it takes longer to recover from their current injury. Anxiety and depression may also make it harder to adjust to the symptoms of brain injury. HOME CARE INSTRUCTIONS  Return to your normal activities slowly, not all at once. You must give your body and brain enough time for recovery.  Get plenty of sleep at night, and rest during the day. Rest helps the brain to heal.   Avoid staying up late at night.   Keep the same bedtime hours on weekends and weekdays.   Take daytime naps or rest breaks when you feel tired.   Limit activities that require a lot of thought or concentration (brain or cognitive rest). This includes:   Homework or job-related work.   Watching TV.   Computer work.   Avoid activities that could lead to a second brain injury, such as contact or recreational sports, until your caregiver says it is okay. Even after your brain injury has healed, you should protect yourself from having another concussion.   Ask your caregiver when you can return to your normal activities such as driving, bicycling, or operating heavy equipment. Your ability to react may be slower after a brain injury.   Talk with your caregiver about when you can return to work or school.   Inform your teachers, school nurse, school counselor, coach, athletic trainer, or work manager about your injury, symptoms, and restrictions. They should be instructed to report:   Increased problems with attention or concentration.   Increased problems remembering or learning new information.   Increased time needed to complete tasks or assignments.   Increased irritability or decreased ability to cope with stress.   Increased symptoms.   Take only those medicines that your caregiver has approved.   Do not drink alcohol until your caregiver says you are well enough to do so. Alcohol and certain other drugs may slow your recovery and can put you at risk of further injury.    If it is harder than usual to remember things, write them down.   If you are easily distracted, try to do one thing at a time. For example, do not try to watch TV while fixing dinner.   Talk with family members or close friends when making important decisions.   Keep all follow-up appointments. Repeated evaluation of your symptoms is recommended for your recovery.  PREVENTION  Protect your head from future injury. It is very important to avoid another head or brain injury before you have recovered. In rare cases, another injury has lead to permanent brain damage, brain swelling, or death. Avoid injuries by using:  Seatbelts when riding in a car.   Alcohol only in moderation.   A helmet when biking, skiing, skateboarding, skating, or doing similar activities.   Safety measures in your home.   Remove clutter and tripping hazards from floors and stairways.   Use grab bars in bathrooms and handrails by stairs.   Place non-slip mats on floors and in bathtubs.     Improve lighting in dim areas.  SEEK MEDICAL CARE IF:  A head injury can cause lingering symptoms. You should seek medical care if you have any of the following symptoms for more than 3 weeks after your injury or are planning to return to sports:  Chronic headaches.   Dizziness or balance problems.   Nausea.   Vision problems.   Increased sensitivity to noise or light.   Depression or mood swings.   Anxiety or irritability.   Memory problems.   Difficulty concentrating or paying attention.   Sleep problems.   Feeling tired all the time.  SEEK IMMEDIATE MEDICAL CARE IF:  You have had a blow or jolt to the head and you (or your family or friends) notice:  Severe or worsening headaches.   Weakness (even if only in one hand or one leg or one part of the face), numbness, or decreased coordination.   Repeated vomiting.   Increased sleepiness or passing out.   One black center of the eye (pupil) is larger  than the other.   Convulsions (seizures).   Slurred speech.   Increasing confusion, restlessness, agitation, or irritability.   Lack of ability to recognize people or places.   Neck pain.   Difficulty being awakened.   Unusual behavior changes.   Loss of consciousness.  Older adults with a brain injury may have a higher risk of serious complications such as a blood clot on the brain. Headaches that get worse or an increase in confusion are signs of this complication. If these signs occur, see a caregiver right away. MAKE SURE YOU:   Understand these instructions.   Will watch your condition.   Will get help right away if you are not doing well or get worse.  FOR MORE INFORMATION  Several groups help people with brain injury and their families. They provide information and put people in touch with local resources. These include support groups, rehabilitation services, and a variety of health care professionals. Among these groups, the Brain Injury Association (BIA, www.biausa.org) has a national office that gathers scientific and educational information and works on a national level to help people with brain injury.  Document Released: 07/08/2003 Document Revised: 04/06/2011 Document Reviewed: 12/04/2007 ExitCare Patient Information 2012 [employer].Head Injury, Adult You have had a head injury that does not appear serious at this time. A concussion is a state of changed mental ability, usually from a blow to the head. You should take clear liquids for the rest of the day and then resume your regular diet. You should not take sedatives or alcoholic beverages for as long as directed by your caregiver after discharge. After injuries such as yours, most problems occur within the first 24 hours. SYMPTOMS These minor symptoms may be experienced after discharge:  Memory difficulties.   Dizziness.   Headaches.   Double vision.   Hearing difficulties.   Depression.    Tiredness.   Weakness.   Difficulty with concentration.  If you experience any of these problems, you should not be alarmed. A concussion requires a few days for recovery. Many patients with head injuries frequently experience such symptoms. Usually, these problems disappear without medical care. If symptoms last for more than one day, notify your caregiver. See your caregiver sooner if symptoms are becoming worse rather than better. HOME CARE INSTRUCTIONS   During the next 24 hours you must stay with someone who can watch you for the warning signs listed below.  Although it is unlikely that   serious side effects will occur, you should be aware of signs and symptoms which may necessitate your return to this location. Side effects may occur up to 7 - 10 days following the injury. It is important for you to carefully monitor your condition and contact your caregiver or seek immediate medical attention if there is a change in your condition. SEEK IMMEDIATE MEDICAL CARE IF:   There is confusion or drowsiness.   You can not awaken the injured person.   There is nausea (feeling sick to your stomach) or continued, forceful vomiting.   You notice dizziness or unsteadiness which is getting worse, or inability to walk.   You have convulsions or unconsciousness.   You experience severe, persistent headaches not relieved by over-the-counter or prescription medicines for pain. (Do not take aspirin as this impairs clotting abilities). Take other pain medications only as directed.   You can not use arms or legs normally.   There is clear or bloody discharge from the nose or ears.  MAKE SURE YOU:   Understand these instructions.   Will watch your condition.   Will get help right away if you are not doing well or get worse.  Document Released: 04/17/2005 Document Revised: 04/06/2011 Document Reviewed: 03/05/2009 ExitCare Patient Information 2012 [employer]. 
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
## 2011-09-06 NOTE — ED Provider Notes (Signed)
Medical screening examination/treatment/procedure(s) were performed by non-physician practitioner and as supervising physician I was immediately available for consultation/collaboration.  Samadhi Mahurin, MD 09/06/11 0029 

## 2011-11-20 ENCOUNTER — Emergency Department (HOSPITAL_COMMUNITY): Payer: Worker's Compensation

## 2011-11-20 ENCOUNTER — Emergency Department (HOSPITAL_COMMUNITY)
Admission: EM | Admit: 2011-11-20 | Discharge: 2011-11-20 | Disposition: A | Discharge status: Home / Self Care | Payer: Worker's Compensation | Attending: Emergency Medicine | Admitting: Emergency Medicine

## 2011-11-20 ENCOUNTER — Encounter (HOSPITAL_COMMUNITY): Payer: Self-pay | Admitting: *Deleted

## 2011-11-20 DIAGNOSIS — I10 Essential (primary) hypertension: Secondary | ICD-10-CM | POA: Insufficient documentation

## 2011-11-20 DIAGNOSIS — Y9289 Other specified places as the place of occurrence of the external cause: Secondary | ICD-10-CM | POA: Insufficient documentation

## 2011-11-20 DIAGNOSIS — F17200 Nicotine dependence, unspecified, uncomplicated: Secondary | ICD-10-CM | POA: Insufficient documentation

## 2011-11-20 DIAGNOSIS — W010XXA Fall on same level from slipping, tripping and stumbling without subsequent striking against object, initial encounter: Secondary | ICD-10-CM | POA: Insufficient documentation

## 2011-11-20 DIAGNOSIS — S93409A Sprain of unspecified ligament of unspecified ankle, initial encounter: Secondary | ICD-10-CM | POA: Insufficient documentation

## 2011-11-20 MED ORDER — ONDANSETRON 8 MG PO TBDP
8.0000 mg | ORAL_TABLET | Freq: Once | ORAL | Status: AC
  Administered 2011-11-20: 8 mg via ORAL
  Filled 2011-11-20: qty 1

## 2011-11-20 MED ORDER — HYDROCODONE-ACETAMINOPHEN 5-325 MG PO TABS
2.0000 | ORAL_TABLET | Freq: Once | ORAL | Status: AC
  Administered 2011-11-20: 2 via ORAL
  Filled 2011-11-20: qty 2

## 2011-11-20 MED ORDER — IBUPROFEN 600 MG PO TABS: 600.0000 mg | ORAL_TABLET | Freq: Three times a day (TID) | ORAL | Status: AC | PRN

## 2011-11-20 NOTE — ED Notes (Signed)
Pt reports that she works at [employer] and was chasing a loose dog and slipped in mud. Felt a pop in her L ankle. Swelling present. No deformity noted.
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
## 2011-11-20 NOTE — ED Notes (Signed)
Patient transported to X-ray 

## 2011-11-20 NOTE — ED Notes (Signed)
Tabitha Holland, at bedside.

## 2011-11-20 NOTE — ED Notes (Signed)
Ortho tech notified of need for crutches and ASO ankle

## 2011-11-29 ENCOUNTER — Encounter (HOSPITAL_COMMUNITY): Payer: Self-pay | Admitting: Family Medicine

## 2011-11-29 ENCOUNTER — Emergency Department (HOSPITAL_COMMUNITY)
Admission: EM | Admit: 2011-11-29 | Discharge: 2011-11-29 | Disposition: A | Discharge status: Home / Self Care | Payer: Worker's Compensation | Attending: Emergency Medicine | Admitting: Emergency Medicine

## 2011-11-29 DIAGNOSIS — S93402A Sprain of unspecified ligament of left ankle, initial encounter: Secondary | ICD-10-CM

## 2011-11-29 DIAGNOSIS — I10 Essential (primary) hypertension: Secondary | ICD-10-CM | POA: Insufficient documentation

## 2011-11-29 DIAGNOSIS — X58XXXA Exposure to other specified factors, initial encounter: Secondary | ICD-10-CM | POA: Insufficient documentation

## 2011-11-29 DIAGNOSIS — S93409A Sprain of unspecified ligament of unspecified ankle, initial encounter: Secondary | ICD-10-CM | POA: Insufficient documentation

## 2011-11-29 DIAGNOSIS — F17200 Nicotine dependence, unspecified, uncomplicated: Secondary | ICD-10-CM | POA: Insufficient documentation

## 2011-11-29 NOTE — ED Provider Notes (Signed)
History     CSN: [id number]  Arrival date & time 11/29/11  1844   First MD Initiated Contact with Patient 11/29/11 2103      Chief Complaint  Patient presents with  . Ankle Pain    (Consider location/radiation/quality/duration/timing/severity/associated sxs/prior treatment) HPI Comments: Tabitha Holland is a 26 y.o. Female who presents for left ankle recheck. Pt was seen here 9 days ago, diagnosed with left ankle sprain. Pt was given crutches and ASO brace and had restricted at work. Pt is here because she needs her ankle re evaluated per job's request to determine if she can be off restrictions. 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 Pt states ankle is still swollen, still painful, but is able to walk on it without crutches. Denies any other complaints.    Past Medical History  Diagnosis Date  . Hypertension     History reviewed. No pertinent past surgical history.  No family history on file.  History  Substance Use Topics  . Smoking status: Current Everyday Smoker -- 0.2 packs/day    Types: Cigarettes  . Smokeless tobacco: Not on file  . Alcohol Use: Yes     ocassional    OB History    Grav Para Term Preterm Abortions TAB SAB Ect Mult Living                  Review of Systems  Constitutional: Negative for fever and chills.  Respiratory: Negative.   Cardiovascular: Negative.   Musculoskeletal: Positive for joint swelling and arthralgias.  Skin: Negative.   Neurological: Negative for weakness and numbness.    Allergies  Percocet  Home Medications   Current Outpatient Rx  Name Route Sig Dispense Refill  . IBUPROFEN 600 MG PO TABS Oral Take 1 tablet (600 mg total) by mouth every 8 (eight) hours as needed for pain. Take with food. 20 tablet 0    BP 153/115  Pulse 70  Temp 98.1 F (36.7 C) (Oral)  Resp 20  SpO2 100%  LMP 11/17/2011  Physical Exam  Nursing note and vitals  reviewed. Constitutional: She is oriented to person, place, and time. She appears well-developed and well-nourished. No distress.  HENT:  Head: Normocephalic.  Eyes: Conjunctivae are normal.  Musculoskeletal:       Left ankle swelling noted. Tender over lateral melleolus. Pain with dorsiflexion, plantarflexion, inversion. Joint stable. No bruising or erythema. Normal foot.  Neurological: She is alert and oriented to person, place, and time.  Skin: Skin is warm and dry.  Psychiatric: She has a normal mood and affect.    ED Course  Procedures (including critical care time)  Negative x-ray on prior visit. Ankle is healing but pt states she is still unable to be up on her feet for more than several hours. Pt unable to see orthopedist due to no insurance. Will d/c home with more restrictions for work, will restrict to few hrs up on her feet at a time for one week.   1. Sprain of ankle, left       MDM          Lottie Mussel, PA 11/30/11 [phone]

## 2011-11-29 NOTE — ED Notes (Signed)
Patient states she is here for ankle recheck. Was seen here last Monday for ankle injury.

## 2011-12-01 NOTE — ED Provider Notes (Signed)
Medical screening examination/treatment/procedure(s) were performed by non-physician practitioner and as supervising physician I was immediately available for consultation/collaboration.  Carline Dura, MD 12/01/11 1321 

## 2011-12-10 ENCOUNTER — Emergency Department (HOSPITAL_COMMUNITY)
Admission: EM | Admit: 2011-12-10 | Discharge: 2011-12-10 | Disposition: A | Discharge status: Home / Self Care | Payer: Self-pay | Attending: Emergency Medicine | Admitting: Emergency Medicine

## 2011-12-10 ENCOUNTER — Encounter (HOSPITAL_COMMUNITY): Payer: Self-pay | Admitting: Emergency Medicine

## 2011-12-10 ENCOUNTER — Emergency Department (HOSPITAL_COMMUNITY): Payer: Self-pay

## 2011-12-10 DIAGNOSIS — I10 Essential (primary) hypertension: Secondary | ICD-10-CM | POA: Insufficient documentation

## 2011-12-10 DIAGNOSIS — S93409A Sprain of unspecified ligament of unspecified ankle, initial encounter: Secondary | ICD-10-CM

## 2011-12-10 DIAGNOSIS — F17200 Nicotine dependence, unspecified, uncomplicated: Secondary | ICD-10-CM | POA: Insufficient documentation

## 2011-12-10 DIAGNOSIS — S93499A Sprain of other ligament of unspecified ankle, initial encounter: Secondary | ICD-10-CM | POA: Insufficient documentation

## 2011-12-10 DIAGNOSIS — X500XXA Overexertion from strenuous movement or load, initial encounter: Secondary | ICD-10-CM | POA: Insufficient documentation

## 2011-12-10 MED ORDER — IBUPROFEN 800 MG PO TABS: 800.0000 mg | ORAL_TABLET | Freq: Three times a day (TID) | ORAL | Status: AC

## 2011-12-10 NOTE — ED Provider Notes (Signed)
History     CSN: [id number]  Arrival date & time 12/10/11  1510   First MD Initiated Contact with Patient 12/10/11 1547      Chief Complaint  Patient presents with  . Ankle Pain    left ankle (workers comp)    (Consider location/radiation/quality/duration/timing/severity/associated sxs/prior treatment) HPI [name] is a 26 y.o. Female who presents for left ankle recheck. Pt was seen here on 7/22, diagnosed with left ankle sprain. Pt was given crutches and ASO brace and had restrictions at work. Pt states ankle is still swollen, still painful, but is able to walk on it without crutches. Continues to wear ASO for comfort. Ankle had mostly been painful over lat aspect but now painful over medial aspect over past few days. She states that she did hear a small "pop" over the area before at the medial pain started. She is still able to weight-bear. Denies any other complaints.   Past Medical History  Diagnosis Date  . Hypertension     No past surgical history on file.  No family history on file.  History  Substance Use Topics  . Smoking status: Current Everyday Smoker -- 0.2 packs/day    Types: Cigarettes  . Smokeless tobacco: Not on file  . Alcohol Use: Yes     ocassional    OB History    Grav Para Term Preterm Abortions TAB SAB Ect Mult Living                  Review of Systems  Constitutional: Negative.   Musculoskeletal: Positive for arthralgias and gait problem.  Skin: Negative for rash and wound.  Neurological: Negative for weakness and numbness.    Allergies  Percocet  Home Medications  No current outpatient prescriptions on file.  LMP 11/17/2011  Physical Exam  Nursing note and vitals reviewed. Constitutional: She appears well-developed and well-nourished. No distress.  HENT:  Head: Normocephalic and atraumatic.  Neck: Normal range of motion.  Cardiovascular: Normal rate.   Pulmonary/Chest: Effort normal.  Musculoskeletal: Normal range of  motion.       L ankle: mild diffuse swelling. No ttp to lat malleolus. Mildly ttp with small bruise seen to med malleolus. Pain w dorsi/plantar flex, inversion. Foot normal. Neurovascularly intact with sensory intact to light touch. DP/PT pulses 2+. Capillary refill less than 3 seconds.  Neurological: She is alert.  Skin: Skin is warm and dry. She is not diaphoretic.  Psychiatric: She has a normal mood and affect.    ED Course  Procedures (including critical care time)  Labs Reviewed - No data to display Dg Ankle Complete Left  12/10/2011  *RADIOLOGY REPORT*  Clinical Data: Ankle inversion injury.  Medial pain.  LEFT ANKLE COMPLETE - 3+ VIEW  Comparison: 11/20/2011  Findings: Abnormal soft tissue swelling overlies both the lateral and medial malleoli.  No malleolar fracture is observed.  The plafond and talar dome appear intact.  Plantar and Achilles calcaneal spurs are present.  IMPRESSION:  1.  Plantar and Achilles calcaneal spurs. 2.  Soft tissue swelling overlying both malleoli.  No underlying fracture observed.  Original Report Authenticated By: Dellia Cloud, M.D.   I personally reviewed the plain films   1. Ankle sprain       MDM  Patient presents for recheck of her left ankle pain. She states that she heard a "pop" yesterday and ankle and is now experiencing pain to the medial aspect which is new for her. On exam, she  has minimal tenderness over the medial malleolus with a small bruise. Pain with movement of the foot. Neurovascularly intact. Repeat x-rays are unremarkable. Likely sprain. Advised continued use of ASO and ibuprofen. RICE discussed. Patient advised of the importance of ortho followup should this not improve. Reasons to return discussed.        Grant Fontana, PA-C 12/11/11 2255

## 2011-12-10 NOTE — ED Notes (Signed)
Pt states that on July 22, she was at work and a dog got out and she was chasing it.  Pt stepped in a mud hole and rolled her lt ankle.  Ankle is swollen and painful.

## 2011-12-11 NOTE — ED Provider Notes (Signed)
Medical screening examination/treatment/procedure(s) were performed by non-physician practitioner and as supervising physician I was immediately available for consultation/collaboration. Devoria Albe, MD, [facility]   Ward Givens, MD 12/11/11 [phone]

## 2012-01-16 ENCOUNTER — Emergency Department (HOSPITAL_COMMUNITY)
Admission: EM | Admit: 2012-01-16 | Discharge: 2012-01-17 | Disposition: A | Discharge status: Home / Self Care | Payer: Self-pay | Attending: Emergency Medicine | Admitting: Emergency Medicine

## 2012-01-16 ENCOUNTER — Encounter (HOSPITAL_COMMUNITY): Payer: Self-pay | Admitting: *Deleted

## 2012-01-16 DIAGNOSIS — G43909 Migraine, unspecified, not intractable, without status migrainosus: Secondary | ICD-10-CM

## 2012-01-16 DIAGNOSIS — F17200 Nicotine dependence, unspecified, uncomplicated: Secondary | ICD-10-CM | POA: Insufficient documentation

## 2012-01-16 DIAGNOSIS — I10 Essential (primary) hypertension: Secondary | ICD-10-CM | POA: Insufficient documentation

## 2012-01-16 LAB — CBC WITH DIFFERENTIAL/PLATELET
Basophils Absolute: 0 10*3/uL (ref 0.0–0.1)
Eosinophils Relative: 1 % (ref 0–5)
HCT: 34.6 % — ABNORMAL LOW (ref 36.0–46.0)
Hemoglobin: 11.6 g/dL — ABNORMAL LOW (ref 12.0–15.0)
Lymphocytes Relative: 26 % (ref 12–46)
Lymphs Abs: 2.2 10*3/uL (ref 0.7–4.0)
MCV: 81 fL (ref 78.0–100.0)
Monocytes Absolute: 0.3 10*3/uL (ref 0.1–1.0)
Monocytes Relative: 4 % (ref 3–12)
Neutro Abs: 5.7 10*3/uL (ref 1.7–7.7)
RBC: 4.27 MIL/uL (ref 3.87–5.11)
RDW: 13.6 % (ref 11.5–15.5)
WBC: 8.4 10*3/uL (ref 4.0–10.5)

## 2012-01-16 LAB — URINALYSIS, ROUTINE W REFLEX MICROSCOPIC
Bilirubin Urine: NEGATIVE
Glucose, UA: NEGATIVE mg/dL
Nitrite: NEGATIVE
Specific Gravity, Urine: 1.02 (ref 1.005–1.030)
pH: 7 (ref 5.0–8.0)

## 2012-01-16 LAB — COMPREHENSIVE METABOLIC PANEL
AST: 13 U/L (ref 0–37)
BUN: 6 mg/dL (ref 6–23)
CO2: 27 mEq/L (ref 19–32)
Chloride: 100 mEq/L (ref 96–112)
Creatinine, Ser: 0.69 mg/dL (ref 0.50–1.10)
GFR calc Af Amer: >90 mL/min (ref >90)
GFR calc non Af Amer: >90 mL/min (ref >90)
Glucose, Bld: 95 mg/dL (ref 70–99)
Total Bilirubin: 0.4 mg/dL (ref 0.3–1.2)

## 2012-01-16 LAB — URINE MICROSCOPIC-ADD ON

## 2012-01-16 MED ORDER — ONDANSETRON 8 MG PO TBDP
8.0000 mg | ORAL_TABLET | Freq: Once | ORAL | Status: AC
  Administered 2012-01-16: 8 mg via ORAL
  Filled 2012-01-16: qty 1

## 2012-01-16 NOTE — ED Notes (Signed)
Per EMS pt c/o n/v/d since Friday; headache; denies abd pain

## 2012-01-16 NOTE — ED Notes (Signed)
Concepcion Living, RN notified of BP

## 2012-01-17 MED ORDER — DIPHENHYDRAMINE HCL 50 MG/ML IJ SOLN
12.5000 mg | Freq: Once | INTRAMUSCULAR | Status: AC
  Administered 2012-01-17: 12.5 mg via INTRAVENOUS
  Filled 2012-01-17: qty 1

## 2012-01-17 MED ORDER — KETOROLAC TROMETHAMINE 30 MG/ML IJ SOLN
30.0000 mg | Freq: Once | INTRAMUSCULAR | Status: DC
  Filled 2012-01-17: qty 1

## 2012-01-17 MED ORDER — KETOROLAC TROMETHAMINE 30 MG/ML IJ SOLN
30.0000 mg | Freq: Once | INTRAMUSCULAR | Status: AC
  Administered 2012-01-17: 30 mg via INTRAVENOUS

## 2012-01-17 MED ORDER — METOCLOPRAMIDE HCL 5 MG/ML IJ SOLN
10.0000 mg | Freq: Once | INTRAMUSCULAR | Status: AC
  Administered 2012-01-17: 10 mg via INTRAVENOUS
  Filled 2012-01-17: qty 2

## 2012-01-17 MED ORDER — METOCLOPRAMIDE HCL 10 MG PO TABS: 10.0000 mg | ORAL_TABLET | Freq: Four times a day (QID) | ORAL | Status: DC

## 2012-01-17 MED ORDER — SODIUM CHLORIDE 0.9 % IV BOLUS (SEPSIS)
1000.0000 mL | Freq: Once | INTRAVENOUS | Status: AC
  Administered 2012-01-17: 1000 mL via INTRAVENOUS

## 2012-01-17 NOTE — ED Notes (Signed)
Pt c/o migraine, headache, pain from back shooting down to legs.

## 2012-01-17 NOTE — ED Provider Notes (Signed)
History     CSN: [id number]  Arrival date & time 01/16/12  2044   First MD Initiated Contact with Patient 01/17/12 0121      Chief Complaint  Patient presents with  . Nausea  . Diarrhea    (Consider location/radiation/quality/duration/timing/severity/associated sxs/prior treatment) HPI Comments: Patient reports, that for the past 5-7, days.  She's had URI symptoms with nasal congestion, facial pressure, and headache, that encompasses her entire head and radiates down her spine into her pelvic region and down both legs.  This is typical for her migraine headaches, although her migraines are normally treated with over-the-counter Tylenol, and last 2-3 hours.  This headache has been consistent for the past week, associated with nasal congestion, and facial pressure.  She does report nausea, and photophobia, no blurry vision.  She has not taken anything for headache.  She is taking over-the-counter Claritin thinking that this was allergies  Patient is a 26 y.o. female presenting with diarrhea. The history is provided by the patient.  Diarrhea The primary symptoms include nausea. Primary symptoms do not include fever, abdominal pain, vomiting, diarrhea or dysuria. The illness began 6 to 7 days ago. The onset was gradual.  Nausea began 6 to 7 days ago.  The illness does not include chills.    Past Medical History  Diagnosis Date  . Hypertension     History reviewed. No pertinent past surgical history.  No family history on file.  History  Substance Use Topics  . Smoking status: Current Every Day Smoker -- 0.2 packs/day    Types: Cigarettes  . Smokeless tobacco: Not on file  . Alcohol Use: Yes     ocassional    OB History    Grav Para Term Preterm Abortions TAB SAB Ect Mult Living                  Review of Systems  Constitutional: Negative for fever and chills.  HENT: Positive for congestion. Negative for rhinorrhea, neck pain and ear discharge.   Eyes: Positive for  photophobia. Negative for visual disturbance.  Cardiovascular: Negative for chest pain.  Gastrointestinal: Positive for nausea. Negative for vomiting, abdominal pain and diarrhea.  Genitourinary: Negative for dysuria.  Neurological: Positive for headaches. Negative for dizziness and weakness.    Allergies  Percocet  Home Medications   Current Outpatient Rx  Name Route Sig Dispense Refill  . IBUPROFEN 200 MG PO TABS Oral Take 400 mg by mouth every 6 (six) hours as needed.    Marland Kitchen LORATADINE 10 MG PO TABS Oral Take 10 mg by mouth daily.    Marland Kitchen METOCLOPRAMIDE HCL 10 MG PO TABS Oral Take 1 tablet (10 mg total) by mouth every 6 (six) hours. 30 tablet 0    As needed for headache/nausea    BP 125/68  Pulse 65  Temp 98.6 F (37 C) (Oral)  Resp 18  SpO2 99%  LMP 01/16/2012  Physical Exam  Constitutional: She is oriented to person, place, and time. She appears well-developed and well-nourished.       Morbidly obese  HENT:  Head: Normocephalic.  Eyes: Pupils are equal, round, and reactive to light.  Neck: Normal range of motion.  Cardiovascular: Normal rate.   Pulmonary/Chest: Effort normal.  Musculoskeletal: Normal range of motion. She exhibits no tenderness.  Neurological: She is alert and oriented to person, place, and time.  Skin: Skin is warm. No rash noted. No erythema.    ED Course  Procedures (including critical care time)  Labs Reviewed  COMPREHENSIVE METABOLIC PANEL - Abnormal; Notable for the following:    Albumin 3.4 (*)     All other components within normal limits  CBC WITH DIFFERENTIAL - Abnormal; Notable for the following:    Hemoglobin 11.6 (*)     HCT 34.6 (*)     All other components within normal limits  URINALYSIS, ROUTINE W REFLEX MICROSCOPIC - Abnormal; Notable for the following:    Hgb urine dipstick LARGE (*)     Leukocytes, UA SMALL (*)     All other components within normal limits  URINE MICROSCOPIC-ADD ON - Abnormal; Notable for the following:     Squamous Epithelial / LPF FEW (*)     Bacteria, UA FEW (*)     All other components within normal limits  PREGNANCY, URINE   No results found.   1. Migraine headache       MDM  Will treat for migrain and reassess 4:35 AM.  Patient awakened to reassess headache, pain.  She states it is better.  Labs were reviewed all within normal range.  We'll discharge him with prescription for Reglan, and encourage her to follow up with her primary care provider        Arman Filter, NP 01/17/12 0438  Arman Filter, NP 01/17/12 9147  Arman Filter, NP 01/17/12 [phone]

## 2012-01-17 NOTE — ED Provider Notes (Signed)
Medical screening examination/treatment/procedure(s) were performed by non-physician practitioner and as supervising physician I was immediately available for consultation/collaboration.   Winton Offord, MD 01/17/12 0718 

## 2012-07-23 ENCOUNTER — Inpatient Hospital Stay (HOSPITAL_COMMUNITY)
Admission: AD | Admit: 2012-07-23 | Discharge: 2012-07-23 | Disposition: A | Discharge status: Home / Self Care | Payer: Self-pay | Source: Ambulatory Visit | Attending: Family Medicine | Admitting: Family Medicine

## 2012-07-23 ENCOUNTER — Encounter (HOSPITAL_COMMUNITY): Payer: Self-pay | Admitting: *Deleted

## 2012-07-23 DIAGNOSIS — Z3202 Encounter for pregnancy test, result negative: Secondary | ICD-10-CM | POA: Insufficient documentation

## 2012-07-23 DIAGNOSIS — R109 Unspecified abdominal pain: Secondary | ICD-10-CM | POA: Insufficient documentation

## 2012-07-23 DIAGNOSIS — N912 Amenorrhea, unspecified: Secondary | ICD-10-CM | POA: Insufficient documentation

## 2012-07-23 DIAGNOSIS — I10 Essential (primary) hypertension: Secondary | ICD-10-CM | POA: Insufficient documentation

## 2012-07-23 LAB — WET PREP, GENITAL
Trich, Wet Prep: NONE SEEN
Yeast Wet Prep HPF POC: NONE SEEN

## 2012-07-23 LAB — URINALYSIS, ROUTINE W REFLEX MICROSCOPIC
Glucose, UA: NEGATIVE mg/dL
Hgb urine dipstick: NEGATIVE
Ketones, ur: NEGATIVE mg/dL
Protein, ur: NEGATIVE mg/dL
Urobilinogen, UA: 0.2 mg/dL (ref 0.0–1.0)

## 2012-07-23 LAB — URINE MICROSCOPIC-ADD ON

## 2012-07-23 LAB — CBC
MCH: 27.1 pg (ref 26.0–34.0)
MCHC: 33.2 g/dL (ref 30.0–36.0)
Platelets: 188 10*3/uL (ref 150–400)

## 2012-07-23 LAB — HCG, QUANTITATIVE, PREGNANCY: hCG, Beta Chain, Quant, S: <1 m[IU]/mL (ref <5)

## 2012-07-23 LAB — ABO/RH: ABO/RH(D): O POS

## 2012-07-23 NOTE — MAU Note (Signed)
Pt states menstrual cycle usually comes every month "like clockwork" on the 8th. Had one positive and one negative upt at home.

## 2012-07-23 NOTE — MAU Provider Note (Signed)
History     CSN: [id number]  Arrival date and time: 07/23/12 6578   First Provider Initiated Contact with Patient 07/23/12 [phone]      Chief Complaint  Patient presents with  . Abdominal Cramping   Abdominal Cramping Associated symptoms include headaches (intermittent, occurring pre-pregnancy) and nausea (in the morning).  Pt presnts for pregnancy test. She reports that her period consistently on the 8th of every month and she has never missed a period before. She has been trying to get pregnant with her partner and did not have a period this month (today is March 25th). She has had two negative HPTs and one positive HPTs. She reports some intermittent abdominal cramping, not occurring right now. She would really like to know definitively if she is pregnant as she and her partner are very anxious.    Past Medical History  Diagnosis Date  . Hypertension     Past Surgical History  Procedure Laterality Date  . Cholecystectomy    . Wisdom tooth extraction      History reviewed. No pertinent family history.  History  Substance Use Topics  . Smoking status: Current Every Day Smoker -- 0.25 packs/day    Types: Cigarettes  . Smokeless tobacco: Not on file  . Alcohol Use: Yes     Comment: ocassional    Allergies:  Allergies  Allergen Reactions  . Percocet (Oxycodone-Acetaminophen)     migraines    No prescriptions prior to admission    Review of Systems  Constitutional: Positive for malaise/fatigue.  Gastrointestinal: Positive for nausea (in the morning) and abdominal pain (intermittent cramping).  Neurological: Positive for dizziness (intermittent, occurring before pregnancy) and headaches (intermittent, occurring pre-pregnancy).     Physical Exam   Blood pressure 144/84, pulse 58, temperature 97.9 F (36.6 C), temperature source Oral, resp. rate 18, height 5\' 10"  (1.778 m), weight 178.774 kg (394 lb 2 oz), last menstrual period 06/08/2012.  Physical Exam   Constitutional: She appears well-developed and well-nourished.  Obese  Neck: Normal range of motion.  Respiratory: Effort normal.  GI: Soft. Bowel sounds are normal.  Genitourinary: Cervix exhibits no motion tenderness, no discharge and no friability. Right adnexum displays no tenderness. Left adnexum displays no tenderness. Vaginal discharge (white) found.   Results for orders placed during the hospital encounter of 07/23/12 (from the past 24 hour(s))  URINALYSIS, ROUTINE W REFLEX MICROSCOPIC     Status: Abnormal   Collection Time    07/23/12  8:45 AM      Result Value Range   Color, Urine YELLOW  YELLOW   APPearance CLEAR  CLEAR   Specific Gravity, Urine 1.010  1.005 - 1.030   pH 6.5  5.0 - 8.0   Glucose, UA NEGATIVE  NEGATIVE mg/dL   Hgb urine dipstick NEGATIVE  NEGATIVE   Bilirubin Urine NEGATIVE  NEGATIVE   Ketones, ur NEGATIVE  NEGATIVE mg/dL   Protein, ur NEGATIVE  NEGATIVE mg/dL   Urobilinogen, UA 0.2  0.0 - 1.0 mg/dL   Nitrite NEGATIVE  NEGATIVE   Leukocytes, UA TRACE (*) NEGATIVE  URINE MICROSCOPIC-ADD ON     Status: None   Collection Time    07/23/12  8:45 AM      Result Value Range   Squamous Epithelial / LPF RARE  RARE   WBC, UA 3-6  <3 WBC/hpf   RBC / HPF 0-2  <3 RBC/hpf  POCT PREGNANCY, URINE     Status: None   Collection Time    07/23/12  8:51 AM      Result Value Range   Preg Test, Ur NEGATIVE  NEGATIVE  HCG, QUANTITATIVE, PREGNANCY     Status: None   Collection Time    07/23/12  9:15 AM      Result Value Range   hCG, Beta Chain, Quant, S <1  <5 mIU/mL  ABO/RH     Status: None   Collection Time    07/23/12  9:15 AM      Result Value Range   ABO/RH(D) O POS    CBC     Status: Abnormal   Collection Time    07/23/12  9:15 AM      Result Value Range   WBC 7.9  4.0 - 10.5 K/uL   RBC 4.13  3.87 - 5.11 MIL/uL   Hemoglobin 11.2 (*) 12.0 - 15.0 g/dL   HCT 09.8 (*) 11.9 - 14.7 %   MCV 81.6  78.0 - 100.0 fL   MCH 27.1  26.0 - 34.0 pg   MCHC 33.2  30.0 -  36.0 g/dL   RDW 82.9  56.2 - 13.0 %   Platelets 188  150 - 400 K/uL  WET PREP, GENITAL     Status: Abnormal   Collection Time    07/23/12  9:40 AM      Result Value Range   Yeast Wet Prep HPF POC NONE SEEN  NONE SEEN   Trich, Wet Prep NONE SEEN  NONE SEEN   Clue Cells Wet Prep HPF POC NONE SEEN  NONE SEEN   WBC, Wet Prep HPF POC MODERATE (*) NONE SEEN   MAU Course  Procedures   Assessment and Plan  27 y/o female with missed period, currently trying to get pregnant - UPT negative - serum quant negative - CG/Chlamydia - ABO/Rh  - Refer to GYN clinic for further evaluation  Lorna Dibble, PA-C 07/23/2012, 10:16 AM   MDM Tabitha Holland is a 27 y.o. morbidly obese A/A female with amenorrhea. She is ambulatory to MAU without difficulty. She appears comfortable. Her urine pregnancy test today is negative and her Bhcg is <1. She is having some cramping that may be dysmenorrhea.  She denies abdominal pain, only occasional cramping, fever, chills, vaginal bleeding or other problems today.  Abdomen soft without tenderness.   Pelvic exam = external genitalia without lesions, white discharge vaginal vault. Cervix long closed, no CMT, no adnexal tenderness, uterus without palpable enlargement.   Assessment: Amenorrhea    Plan:  Follow up in GYN clinic Discussed with the patient and all questioned fully answered.

## 2012-07-23 NOTE — MAU Provider Note (Signed)
Chart reviewed and agree with management and plan.  

## 2012-07-27 ENCOUNTER — Inpatient Hospital Stay (HOSPITAL_COMMUNITY)
Admission: AD | Admit: 2012-07-27 | Discharge: 2012-07-27 | Disposition: A | Discharge status: Home / Self Care | Payer: Self-pay | Source: Ambulatory Visit | Attending: Obstetrics & Gynecology | Admitting: Obstetrics & Gynecology

## 2012-07-27 LAB — URINE MICROSCOPIC-ADD ON

## 2012-07-27 LAB — URINALYSIS, ROUTINE W REFLEX MICROSCOPIC
Bilirubin Urine: NEGATIVE
Glucose, UA: NEGATIVE mg/dL
Hgb urine dipstick: NEGATIVE
Ketones, ur: NEGATIVE mg/dL
Protein, ur: NEGATIVE mg/dL

## 2012-07-27 NOTE — ED Notes (Signed)
Pt was called that she needed treatment for an STD came to [facility]. Informed her she needed to go to [facility]. Pt agreered.

## 2012-07-28 LAB — URINE CULTURE: Colony Count: 95000

## 2012-07-29 ENCOUNTER — Telehealth (HOSPITAL_COMMUNITY): Payer: Self-pay | Admitting: *Deleted

## 2013-04-10 ENCOUNTER — Encounter (HOSPITAL_COMMUNITY): Payer: Self-pay | Admitting: *Deleted

## 2013-04-10 ENCOUNTER — Inpatient Hospital Stay (HOSPITAL_COMMUNITY)
Admission: AD | Admit: 2013-04-10 | Discharge: 2013-04-10 | Disposition: A | Discharge status: Home / Self Care | Payer: BC Managed Care – PPO | Source: Ambulatory Visit | Attending: Obstetrics & Gynecology | Admitting: Obstetrics & Gynecology

## 2013-04-10 ENCOUNTER — Inpatient Hospital Stay (HOSPITAL_COMMUNITY): Payer: BC Managed Care – PPO

## 2013-04-10 DIAGNOSIS — O34519 Maternal care for incarceration of gravid uterus, unspecified trimester: Secondary | ICD-10-CM | POA: Insufficient documentation

## 2013-04-10 DIAGNOSIS — Z3200 Encounter for pregnancy test, result unknown: Secondary | ICD-10-CM

## 2013-04-10 DIAGNOSIS — O208 Other hemorrhage in early pregnancy: Secondary | ICD-10-CM | POA: Insufficient documentation

## 2013-04-10 HISTORY — DX: Morbid (severe) obesity due to excess calories: E66.01

## 2013-04-10 LAB — WET PREP, GENITAL
Clue Cells Wet Prep HPF POC: NONE SEEN
Trich, Wet Prep: NONE SEEN
Yeast Wet Prep HPF POC: NONE SEEN

## 2013-04-10 LAB — OB RESULTS CONSOLE GC/CHLAMYDIA
Chlamydia: NEGATIVE
Gonorrhea: NEGATIVE

## 2013-04-10 LAB — HCG, QUANTITATIVE, PREGNANCY: hCG, Beta Chain, Quant, S: 54214 m[IU]/mL — ABNORMAL HIGH (ref <5)

## 2013-04-10 MED ORDER — PROMETHAZINE HCL 25 MG PO TABS: 25.0000 mg | ORAL_TABLET | Freq: Four times a day (QID) | ORAL | Status: DC | PRN

## 2013-04-10 NOTE — MAU Note (Signed)
Sent from [facility] care center to confirm viability. Has retroverted uterus making it hard to see on abd [place].

## 2013-04-10 NOTE — MAU Provider Note (Signed)
History     CSN: [id number]  Arrival date and time: 04/10/13 1440   First Provider Initiated Contact with Patient 04/10/13 1543      Chief Complaint  Patient presents with  . confirm viability    HPI  Pt is [redacted]w[redacted]d G3P2002 who presents for confirmation of viability. Pt's LMP was 02/01/2013. Pt had positive HPT 03/24/2013. [facility] and unable confirm if embryo with abd ultrasound.  Pt denies spotting or bleeding or pain.  Pt has hx of chlamydia dx in [facility] 2014- pt and partner treated.    Past Medical History  Diagnosis Date  . Hypertension   . Morbid obesity     Past Surgical History  Procedure Laterality Date  . Cholecystectomy    . Wisdom tooth extraction      History reviewed. No pertinent family history.  History  Substance Use Topics  . Smoking status: Former Smoker -- 0.25 packs/day    Types: Cigarettes    Quit date: 03/27/2013  . Smokeless tobacco: Not on file  . Alcohol Use: Yes     Comment: ocassional    Allergies:  Allergies  Allergen Reactions  . Codeine Other (See Comments)    migraines  . Hydrocodone Other (See Comments)    migraines  . Percocet [Oxycodone-Acetaminophen] Other (See Comments)    migraines    Prescriptions prior to admission  Medication Sig Dispense Refill  . Prenatal Vit-Fe Fumarate-FA (PRENATAL MULTIVITAMIN) TABS tablet Take 1 tablet by mouth every morning.        Review of Systems  Constitutional: Negative for fever and chills.  Gastrointestinal: Negative for nausea, vomiting, abdominal pain, diarrhea and constipation.  Genitourinary: Negative for dysuria and urgency.   Physical Exam   Blood pressure 132/94, pulse 76, temperature 98.1 F (36.7 C), temperature source Oral, resp. rate 18, last menstrual period 02/01/2013.  Physical Exam  Nursing note and vitals reviewed. Constitutional: She is oriented to person, place, and time. She appears well-developed and well-nourished. No  distress.  HENT:  Head: Normocephalic.  Eyes: Pupils are equal, round, and reactive to light.  Neck: Normal range of motion. Neck supple.  Cardiovascular: Normal rate.   GI: Soft. She exhibits no distension. There is no tenderness. There is no rebound and no guarding.  Genitourinary:  Small amount of creamy yellow discharge in vault; cervix parous, NT; bimanual difficult to assess due to habitus- nontender  Musculoskeletal: Normal range of motion.  Neurological: She is alert and oriented to person, place, and time.  Skin: Skin is warm and dry.  Psychiatric: She has a normal mood and affect.    MAU Course  Procedures  Results for orders placed during the hospital encounter of 04/10/13 (from the past 24 hour(s))  POCT PREGNANCY, URINE     Status: Abnormal   Collection Time    04/10/13  3:13 PM      Result Value Range   Preg Test, Ur POSITIVE (*) NEGATIVE  HCG, QUANTITATIVE, PREGNANCY     Status: Abnormal   Collection Time    04/10/13  4:08 PM      Result Value Range   hCG, Beta Chain, Quant, [place] [address] (*) <5 mIU/mL  WET PREP, GENITAL     Status: Abnormal   Collection Time    04/10/13  4:20 PM      Result Value Range   Yeast Wet Prep HPF POC NONE SEEN  NONE SEEN   Trich, Wet Prep NONE SEEN  NONE SEEN   Clue Cells Wet Prep HPF POC NONE SEEN  NONE SEEN   WBC, Wet Prep HPF POC MANY (*) NONE SEEN   US Ob Comp Less 14 Wks  04/10/2013   CLINICAL DATA:  Inconclusive fetal viability. By LMP, the patient is 9 weeks 5 days. EDC by LMP is 11/08/2013. Known LMP to be 02/01/2013. Gravida 3 para 2.  EXAM: OBSTETRIC <14 WK ULTRASOUND  TECHNIQUE: Transabdominal ultrasound was performed for evaluation of the gestation as well as the maternal uterus and adnexal regions.  COMPARISON:  None.  FINDINGS: Intrauterine gestational sac: Present  Yolk sac:  Present  Embryo:  Present  Cardiac Activity: Present  Heart Rate: 170 bpm  CRL:   25.2  mm   9 w 2d                  [place] EDC: 11/11/2013  Maternal  uterus/adnexae: Small subchorionic hemorrhage is identified. The ovaries have a normal appearance. No free pelvic fluid identified.  IMPRESSION: 1. Single living intrauterine embryo at 9 weeks 2 days by ultrasound. The ultrasound dating correlates well if clinical dating. Confirmed EDC 11/08/2013. 2. Small subchorionic hemorrhage identified. 3. No evidence for adnexal mass.   Electronically Signed   By: Rosalie Gums M.D.   On: 04/10/2013 17:21   Assessment and Plan  Single living IUP [redacted]w[redacted]d  F/u with OB care- pt desires to go to [facility] clinic since has not applied for Medicaid Confirmation of pregnancy  Tabitha Holland 04/10/2013, 3:44 PM

## 2013-04-11 NOTE — MAU Provider Note (Signed)
Attestation of Attending Supervision of Advanced Practitioner (CNM/NP): Evaluation and management procedures were performed by the Advanced Practitioner under my supervision and collaboration. I have reviewed the Advanced Practitioner's note and chart, and I agree with the management and plan.  Lucia Mccreadie H. 7:38 AM   

## 2013-05-07 ENCOUNTER — Encounter: Payer: Self-pay | Admitting: Obstetrics and Gynecology

## 2013-05-12 ENCOUNTER — Encounter: Payer: Self-pay | Admitting: Obstetrics and Gynecology

## 2013-05-12 ENCOUNTER — Ambulatory Visit (INDEPENDENT_AMBULATORY_CARE_PROVIDER_SITE_OTHER): Payer: BC Managed Care – PPO | Admitting: Obstetrics and Gynecology

## 2013-05-12 VITALS — BP 145/92 | Temp 98.6°F | Wt 359.4 lb

## 2013-05-12 DIAGNOSIS — Z8759 Personal history of other complications of pregnancy, childbirth and the puerperium: Secondary | ICD-10-CM | POA: Insufficient documentation

## 2013-05-12 DIAGNOSIS — O99330 Smoking (tobacco) complicating pregnancy, unspecified trimester: Secondary | ICD-10-CM

## 2013-05-12 DIAGNOSIS — F17200 Nicotine dependence, unspecified, uncomplicated: Secondary | ICD-10-CM

## 2013-05-12 DIAGNOSIS — O99332 Smoking (tobacco) complicating pregnancy, second trimester: Secondary | ICD-10-CM

## 2013-05-12 DIAGNOSIS — O139 Gestational [pregnancy-induced] hypertension without significant proteinuria, unspecified trimester: Secondary | ICD-10-CM

## 2013-05-12 DIAGNOSIS — Z23 Encounter for immunization: Secondary | ICD-10-CM

## 2013-05-12 DIAGNOSIS — I10 Essential (primary) hypertension: Secondary | ICD-10-CM

## 2013-05-12 DIAGNOSIS — Z720 Tobacco use: Secondary | ICD-10-CM | POA: Insufficient documentation

## 2013-05-12 DIAGNOSIS — O162 Unspecified maternal hypertension, second trimester: Secondary | ICD-10-CM

## 2013-05-12 DIAGNOSIS — Z3482 Encounter for supervision of other normal pregnancy, second trimester: Secondary | ICD-10-CM

## 2013-05-12 DIAGNOSIS — Z3480 Encounter for supervision of other normal pregnancy, unspecified trimester: Secondary | ICD-10-CM | POA: Insufficient documentation

## 2013-05-12 LAB — POCT URINALYSIS DIP (DEVICE)
BILIRUBIN URINE: NEGATIVE
GLUCOSE, UA: NEGATIVE mg/dL
Hgb urine dipstick: NEGATIVE
Ketones, ur: NEGATIVE mg/dL
NITRITE: NEGATIVE
Protein, ur: NEGATIVE mg/dL
Specific Gravity, Urine: 1.02 (ref 1.005–1.030)
Urobilinogen, UA: 0.2 mg/dL (ref 0.0–1.0)
pH: 7 (ref 5.0–8.0)

## 2013-05-12 LAB — GLUCOSE TOLERANCE, 1 HOUR (50G) W/O FASTING: Glucose, 1 Hour GTT: 86 mg/dL (ref 70–140)

## 2013-05-12 LAB — HIV ANTIBODY (ROUTINE TESTING W REFLEX): HIV: NONREACTIVE

## 2013-05-12 NOTE — Progress Notes (Signed)
U/S scheduled 06/17/12 at 945 am.

## 2013-05-12 NOTE — Patient Instructions (Signed)
Second Trimester of Pregnancy The second trimester is from week 13 through week 28, months 4 through 6. The second trimester is often a time when you feel your best. Your body has also adjusted to being pregnant, and you begin to feel better physically. Usually, morning sickness has lessened or quit completely, you may have more energy, and you may have an increase in appetite. The second trimester is also a time when the fetus is growing rapidly. At the end of the sixth month, the fetus is about 9 inches long and weighs about 1 pounds. You will likely begin to feel the baby move (quickening) between 18 and 20 weeks of the pregnancy. BODY CHANGES Your body goes through many changes during pregnancy. The changes vary from woman to woman.   Your weight will continue to increase. You will notice your lower abdomen bulging out.  You may begin to get stretch marks on your hips, abdomen, and breasts.  You may develop headaches that can be relieved by medicines approved by your caregiver.  You may urinate more often because the fetus is pressing on your bladder.  You may develop or continue to have heartburn as a result of your pregnancy.  You may develop constipation because certain hormones are causing the muscles that push waste through your intestines to slow down.  You may develop hemorrhoids or swollen, bulging veins (varicose veins).  You may have back pain because of the weight gain and pregnancy hormones relaxing your joints between the bones in your pelvis and as a result of a shift in weight and the muscles that support your balance.  Your breasts will continue to grow and be tender.  Your gums may bleed and may be sensitive to brushing and flossing.  Dark spots or blotches (chloasma, mask of pregnancy) may develop on your face. This will likely fade after the baby is born.  A dark line from your belly button to the pubic area (linea nigra) may appear. This will likely fade after  the baby is born. WHAT TO EXPECT AT YOUR PRENATAL VISITS During a routine prenatal visit:  You will be weighed to make sure you and the fetus are growing normally.  Your blood pressure will be taken.  Your abdomen will be measured to track your baby's growth.  The fetal heartbeat will be listened to.  Any test results from the previous visit will be discussed. Your caregiver may ask you:  How you are feeling.  If you are feeling the baby move.  If you have had any abnormal symptoms, such as leaking fluid, bleeding, severe headaches, or abdominal cramping.  If you have any questions. Other tests that may be performed during your second trimester include:  Blood tests that check for:  Low iron levels (anemia).  Gestational diabetes (between 24 and 28 weeks).  Rh antibodies.  Urine tests to check for infections, diabetes, or protein in the urine.  An ultrasound to confirm the proper growth and development of the baby.  An amniocentesis to check for possible genetic problems.  Fetal screens for spina bifida and Down syndrome. HOME CARE INSTRUCTIONS   Avoid all smoking, herbs, alcohol, and unprescribed drugs. These chemicals affect the formation and growth of the baby.  Follow your caregiver's instructions regarding medicine use. There are medicines that are either safe or unsafe to take during pregnancy.  Exercise only as directed by your caregiver. Experiencing uterine cramps is a good sign to stop exercising.  Continue to eat regular,  healthy meals.  Wear a good support bra for breast tenderness.  Do not use hot tubs, steam rooms, or saunas.  Wear your seat belt at all times when driving.  Avoid raw meat, uncooked cheese, cat litter boxes, and soil used by cats. These carry germs that can cause birth defects in the baby.  Take your prenatal vitamins.  Try taking a stool softener (if your caregiver approves) if you develop constipation. Eat more high-fiber  foods, such as fresh vegetables or fruit and whole grains. Drink plenty of fluids to keep your urine clear or pale yellow.  Take warm sitz baths to soothe any pain or discomfort caused by hemorrhoids. Use hemorrhoid cream if your caregiver approves.  If you develop varicose veins, wear support hose. Elevate your feet for 15 minutes, 3 4 times a day. Limit salt in your diet.  Avoid heavy lifting, wear low heel shoes, and practice good posture.  Rest with your legs elevated if you have leg cramps or low back pain.  Visit your dentist if you have not gone yet during your pregnancy. Use a soft toothbrush to brush your teeth and be gentle when you floss.  A sexual relationship may be continued unless your caregiver directs you otherwise.  Continue to go to all your prenatal visits as directed by your caregiver. SEEK MEDICAL CARE IF:   You have dizziness.  You have mild pelvic cramps, pelvic pressure, or nagging pain in the abdominal area.  You have persistent nausea, vomiting, or diarrhea.  You have a bad smelling vaginal discharge.  You have pain with urination. SEEK IMMEDIATE MEDICAL CARE IF:   You have a fever.  You are leaking fluid from your vagina.  You have spotting or bleeding from your vagina.  You have severe abdominal cramping or pain.  You have rapid weight gain or loss.  You have shortness of breath with chest pain.  You notice sudden or extreme swelling of your face, hands, ankles, feet, or legs.  You have not felt your baby move in over an hour.  You have severe headaches that do not go away with medicine.  You have vision changes. Document Released: 04/11/2001 Document Revised: 12/18/2012 Document Reviewed: 06/18/2012 [facility] Patient Information 2014 [place].  Pregnancy and Smoking Smoking during pregnancy is very unhealthy for the mother and the developing fetus. The addictive drug in cigarettes (nicotine), carbon monoxide, and many other  poisons are inhaled from a cigarette and are carried through your bloodstream to your fetus. Cigarette smoke contains more than 2,500 chemicals. It is not known which of these chemicals are harmful to the developing fetus. However, both nicotine and carbon monoxide play a role in causing health problems in pregnancy. Effects on the fetus of smoking during pregnancy:  Decrease in blood flow and oxygen to the uterus, placenta, and your fetus.  Increased heart rate of the fetus.  Slowing of your fetus's growth in the uterus (intrauterine growth retardation).  Placental problems. Placenta may partially cover or completely cover the opening to the cervix (placenta previa), or the placenta may partially or completely separate from the uterus (placental abruption).  Increase risk of pregnancy outside of the uterus (tubal pregnancy).  Premature rupture membranes, causing the sac that holds the fetus to break too early, resulting in premature birth and increased health risks to the newborn.  Increased risk of birth defects, including heart defects.  Increased risk of miscarriage. Newborns born to women who smoke during pregnancy:  Are more likely  to be born too early (prematurely).  Are more likely to be at a low birth weight.  Are at risk for serious health problems, chronic or lifelong disabilities (cerebral palsy, mental retardation, learning problems), and possibly even death  Are at risk of Sudden Infant Death Syndrome (SIDS).  Have higher rates of miscarriage and stillbirth.  Have more lung and breathing (respiratory) problems. Long-term effects on a child's behavior: Some of the following trends are seen with children of smoking mothers:  Increased risk for drug abuse, behavior, and conduct disorders.  Increased risk for smoking in adolescent girls.  Increased risk for negative behavior in 25-year-olds.  Increase risk for asthma, colic, and childhood obesity, which can lead to  diabetes.  Increased risk for finger and toe disorders. Resources to stop smoking during pregnancy:  Counseling.  Psychological treatment.  Acupuncture.  Family intervention.  Hypnosis.  Medicines that are safe to take during pregnancy. Nicotine supplements have not been studied enough. They should only be considered when all other methods fail.  Telephone QUIT lines. Smoking and Breastfeeding:  Nicotine gets passed to the infant through a mother's breastmilk. This can cause nausea, colic, cramping, and diarrhea in the infant.  Smoking may reduce milk supply and interfere with the let-down response.  Even formula-fed infants of mothers who smoke have nicotine and cotinine (nicotine by-product) in their urine. Other resources to help stop smoking:  American Cancer Society: www.cancer.org  American Heart Association: www.americanheart.[place]: www.cancer.gov  Smoke Free Families: www.smokefreefamilies.[address] [place] Line): (365)682-2787 START Document Released: 08/29/2004 Document Revised: 07/10/2011 Document Reviewed: 01/27/2009 [facility] Patient Information 2014 [place], [place].  Contraception Choices Contraception (birth control) is the use of any methods or devices to prevent pregnancy. Below are some methods to help avoid pregnancy. HORMONAL METHODS   Contraceptive implant This is a thin, plastic tube containing progesterone hormone. It does not contain estrogen hormone. Your health care provider inserts the tube in the inner part of the upper arm. The tube can remain in place for up to 3 years. After 3 years, the implant must be removed. The implant prevents the ovaries from releasing an egg (ovulation), thickens the cervical mucus to prevent sperm from entering the uterus, and thins the lining of the inside of the uterus.  Progesterone-only injections These injections are given every 3 months by your health care provider to prevent pregnancy.  This synthetic progesterone hormone stops the ovaries from releasing eggs. It also thickens cervical mucus and changes the uterine lining. This makes it harder for sperm to survive in the uterus.  Birth control pills These pills contain estrogen and progesterone hormone. They work by preventing the ovaries from releasing eggs (ovulation). They also cause the cervical mucus to thicken, preventing the sperm from entering the uterus. Birth control pills are prescribed by a health care provider.Birth control pills can also be used to treat heavy periods.  Minipill This type of birth control pill contains only the progesterone hormone. They are taken every day of each month and must be prescribed by your health care provider.  Birth control patch The patch contains hormones similar to those in birth control pills. It must be changed once a week and is prescribed by a health care provider.  Vaginal ring The ring contains hormones similar to those in birth control pills. It is left in the vagina for 3 weeks, removed for 1 week, and then a new one is put back in place. The patient must be  comfortable inserting and removing the ring from the vagina.A health care provider's prescription is necessary.  Emergency contraception Emergency contraceptives prevent pregnancy after unprotected sexual intercourse. This pill can be taken right after sex or up to 5 days after unprotected sex. It is most effective the sooner you take the pills after having sexual intercourse. Most emergency contraceptive pills are available without a prescription. Check with your pharmacist. Do not use emergency contraception as your only form of birth control. BARRIER METHODS   Female condom This is a thin sheath (latex or rubber) that is worn over the penis during sexual intercourse. It can be used with spermicide to increase effectiveness.  Female condom. This is a soft, loose-fitting sheath that is put into the vagina before sexual  intercourse.  Diaphragm This is a soft, latex, dome-shaped barrier that must be fitted by a health care provider. It is inserted into the vagina, along with a spermicidal jelly. It is inserted before intercourse. The diaphragm should be left in the vagina for 6 to 8 hours after intercourse.  Cervical cap This is a round, soft, latex or plastic cup that fits over the cervix and must be fitted by a health care provider. The cap can be left in place for up to 48 hours after intercourse.  Sponge This is a soft, circular piece of polyurethane foam. The sponge has spermicide in it. It is inserted into the vagina after wetting it and before sexual intercourse.  Spermicides These are chemicals that kill or block sperm from entering the cervix and uterus. They come in the form of creams, jellies, suppositories, foam, or tablets. They do not require a prescription. They are inserted into the vagina with an applicator before having sexual intercourse. The process must be repeated every time you have sexual intercourse. INTRAUTERINE CONTRACEPTION  Intrauterine device (IUD) This is a T-shaped device that is put in a woman's uterus during a menstrual period to prevent pregnancy. There are 2 types:  Copper IUD This type of IUD is wrapped in copper wire and is placed inside the uterus. Copper makes the uterus and fallopian tubes produce a fluid that kills sperm. It can stay in place for 10 years.  Hormone IUD This type of IUD contains the hormone progestin (synthetic progesterone). The hormone thickens the cervical mucus and prevents sperm from entering the uterus, and it also thins the uterine lining to prevent implantation of a fertilized egg. The hormone can weaken or kill the sperm that get into the uterus. It can stay in place for 3 5 years, depending on which type of IUD is used. PERMANENT METHODS OF CONTRACEPTION  Female tubal ligation This is when the woman's fallopian tubes are surgically sealed, tied, or  blocked to prevent the egg from traveling to the uterus.  Hysteroscopic sterilization This involves placing a small coil or insert into each fallopian tube. Your doctor uses a technique called hysteroscopy to do the procedure. The device causes scar tissue to form. This results in permanent blockage of the fallopian tubes, so the sperm cannot fertilize the egg. It takes about 3 months after the procedure for the tubes to become blocked. You must use another form of birth control for these 3 months.  Female sterilization This is when the female has the tubes that carry sperm tied off (vasectomy).This blocks sperm from entering the vagina during sexual intercourse. After the procedure, the man can still ejaculate fluid (semen). NATURAL PLANNING METHODS  Natural family planning This is not having  sexual intercourse or using a barrier method (condom, diaphragm, cervical cap) on days the woman could become pregnant.  Calendar method This is keeping track of the length of each menstrual cycle and identifying when you are fertile.  Ovulation method This is avoiding sexual intercourse during ovulation.  Symptothermal method This is avoiding sexual intercourse during ovulation, using a thermometer and ovulation symptoms.  Post ovulation method This is timing sexual intercourse after you have ovulated. Regardless of which type or method of contraception you choose, it is important that you use condoms to protect against the transmission of sexually transmitted infections (STIs). Talk with your health care provider about which form of contraception is most appropriate for you. Document Released: 04/17/2005 Document Revised: 12/18/2012 Document Reviewed: 10/10/2012 [facility] Patient Information 2014 [place].  Breastfeeding Deciding to breastfeed is one of the best choices you can make for you and your baby. A change in hormones during pregnancy causes your breast tissue to grow and increases the number  and size of your milk ducts. These hormones also allow proteins, sugars, and fats from your blood supply to make breast milk in your milk-producing glands. Hormones prevent breast milk from being released before your baby is born as well as prompt milk flow after birth. Once breastfeeding has begun, thoughts of your baby, as well as his or her sucking or crying, can stimulate the release of milk from your milk-producing glands.  BENEFITS OF BREASTFEEDING For Your Baby  Your first milk (colostrum) helps your baby's digestive system function better.   There are antibodies in your milk that help your baby fight off infections.   Your baby has a lower incidence of asthma, allergies, and sudden infant death syndrome.   The nutrients in breast milk are better for your baby than infant formulas and are designed uniquely for your baby's needs.   Breast milk improves your baby's brain development.   Your baby is less likely to develop other conditions, such as childhood obesity, asthma, or type 2 diabetes mellitus.  For You   Breastfeeding helps to create a very special bond between you and your baby.   Breastfeeding is convenient. Breast milk is always available at the correct temperature and costs nothing.   Breastfeeding helps to burn calories and helps you lose the weight gained during pregnancy.   Breastfeeding makes your uterus contract to its prepregnancy size faster and slows bleeding (lochia) after you give birth.   Breastfeeding helps to lower your risk of developing type 2 diabetes mellitus, osteoporosis, and breast or ovarian cancer later in life. SIGNS THAT YOUR BABY IS HUNGRY Early Signs of Hunger  Increased alertness or activity.  Stretching.  Movement of the head from side to side.  Movement of the head and opening of the mouth when the corner of the mouth or cheek is stroked (rooting).  Increased sucking sounds, smacking lips, cooing, sighing, or  squeaking.  Hand-to-mouth movements.  Increased sucking of fingers or hands. Late Signs of Hunger  Fussing.  Intermittent crying. Extreme Signs of Hunger Signs of extreme hunger will require calming and consoling before your baby will be able to breastfeed successfully. Do not wait for the following signs of extreme hunger to occur before you initiate breastfeeding:   Restlessness.  A loud, strong cry.   Screaming. BREASTFEEDING BASICS Breastfeeding Initiation  Find a comfortable place to sit or lie down, with your neck and back well supported.  Place a pillow or rolled up blanket under your baby to  bring him or her to the level of your breast (if you are seated). Nursing pillows are specially designed to help support your arms and your baby while you breastfeed.  Make sure that your baby's abdomen is facing your abdomen.   Gently massage your breast. With your fingertips, massage from your chest wall toward your nipple in a circular motion. This encourages milk flow. You may need to continue this action during the feeding if your milk flows slowly.  Support your breast with 4 fingers underneath and your thumb above your nipple. Make sure your fingers are well away from your nipple and your baby's mouth.   Stroke your baby's lips gently with your finger or nipple.   When your baby's mouth is open wide enough, quickly bring your baby to your breast, placing your entire nipple and as much of the colored area around your nipple (areola) as possible into your baby's mouth.   More areola should be visible above your baby's upper lip than below the lower lip.   Your baby's tongue should be between his or her lower gum and your breast.   Ensure that your baby's mouth is correctly positioned around your nipple (latched). Your baby's lips should create a seal on your breast and be turned out (everted).  It is common for your baby to suck about 2 3 minutes in order to start the  flow of breast milk. Latching Teaching your baby how to latch on to your breast properly is very important. An improper latch can cause nipple pain and decreased milk supply for you and poor weight gain in your baby. Also, if your baby is not latched onto your nipple properly, he or she may swallow some air during feeding. This can make your baby fussy. Burping your baby when you switch breasts during the feeding can help to get rid of the air. However, teaching your baby to latch on properly is still the best way to prevent fussiness from swallowing air while breastfeeding. Signs that your baby has successfully latched on to your nipple:    Silent tugging or silent sucking, without causing you pain.   Swallowing heard between every 3 4 sucks.    Muscle movement above and in front of his or her ears while sucking.  Signs that your baby has not successfully latched on to nipple:   Sucking sounds or smacking sounds from your baby while breastfeeding.  Nipple pain. If you think your baby has not latched on correctly, slip your finger into the corner of your baby's mouth to break the suction and place it between your baby's gums. Attempt breastfeeding initiation again. Signs of Successful Breastfeeding Signs from your baby:   A gradual decrease in the number of sucks or complete cessation of sucking.   Falling asleep.   Relaxation of his or her body.   Retention of a small amount of milk in his or her mouth.   Letting go of your breast by himself or herself. Signs from you:  Breasts that have increased in firmness, weight, and size 1 3 hours after feeding.   Breasts that are softer immediately after breastfeeding.  Increased milk volume, as well as a change in milk consistency and color by the 5th day of breastfeeding.   Nipples that are not sore, cracked, or bleeding. Signs That Your [employer]el Books is Getting Enough Milk  Wetting at least 3 diapers in a 24-hour period. The urine  should be clear and pale yellow by age  5 days.  At least 3 stools in a 24-hour period by age 65 days. The stool should be soft and yellow.  At least 3 stools in a 24-hour period by age 61 days. The stool should be seedy and yellow.  No loss of weight greater than 10% of birth weight during the first 22 days of age.  Average weight gain of 4 7 ounces (120 210 mL) per week after age 54 days.  Consistent daily weight gain by age 613 days, without weight loss after the age of 2 weeks. After a feeding, your baby may spit up a small amount. This is common. BREASTFEEDING FREQUENCY AND DURATION Frequent feeding will help you make more milk and can prevent sore nipples and breast engorgement. Breastfeed when you feel the need to reduce the fullness of your breasts or when your baby shows signs of hunger. This is called "breastfeeding on demand." Avoid introducing a pacifier to your baby while you are working to establish breastfeeding (the first 4 6 weeks after your baby is born). After this time you may choose to use a pacifier. Research has shown that pacifier use during the first year of a baby's life decreases the risk of sudden infant death syndrome (SIDS). Allow your baby to feed on each breast as long as he or she wants. Breastfeed until your baby is finished feeding. When your baby unlatches or falls asleep while feeding from the first breast, offer the second breast. Because newborns are often sleepy in the first few weeks of life, you may need to awaken your baby to get him or her to feed. Breastfeeding times will vary from baby to baby. However, the following rules can serve as a guide to help you ensure that your baby is properly fed:  Newborns (babies 13 weeks of age or younger) may breastfeed every 1 3 hours.  Newborns should not go longer than 3 hours during the day or 5 hours during the night without breastfeeding.  You should breastfeed your baby a minimum of 8 times in a 24-hour period until  you begin to introduce solid foods to your baby at around 42 months of age. BREAST MILK PUMPING Pumping and storing breast milk allows you to ensure that your baby is exclusively fed your breast milk, even at times when you are unable to breastfeed. This is especially important if you are going back to work while you are still breastfeeding or when you are not able to be present during feedings. Your lactation consultant can give you guidelines on how long it is safe to store breast milk.  A breast pump is a machine that allows you to pump milk from your breast into a sterile bottle. The pumped breast milk can then be stored in a refrigerator or freezer. Some breast pumps are operated by hand, while others use electricity. Ask your lactation consultant which type will work best for you. Breast pumps can be purchased, but some hospitals and breastfeeding support groups lease breast pumps on a monthly basis. A lactation consultant can teach you how to hand express breast milk, if you prefer not to use a pump.  CARING FOR YOUR BREASTS WHILE YOU BREASTFEED Nipples can become dry, cracked, and sore while breastfeeding. The following recommendations can help keep your breasts moisturized and healthy:  Avoid using soap on your nipples.   Wear a supportive bra. Although not required, special nursing bras and tank tops are designed to allow access to your breasts for breastfeeding  without taking off your entire bra or top. Avoid wearing underwire style bras or extremely tight bras.  Air dry your nipples for 3 69minutes after each feeding.   Use only cotton bra pads to absorb leaked breast milk. Leaking of breast milk between feedings is normal.   Use lanolin on your nipples after breastfeeding. Lanolin helps to maintain your skin's normal moisture barrier. If you use pure lanolin you do not need to wash it off before feeding your baby again. Pure lanolin is not toxic to your baby. You may also hand express a  few drops of breast milk and gently massage that milk into your nipples and allow the milk to air dry. In the first few weeks after giving birth, some women experience extremely full breasts (engorgement). Engorgement can make your breasts feel heavy, warm, and tender to the touch. Engorgement peaks within 3 5 days after you give birth. The following recommendations can help ease engorgement:  Completely empty your breasts while breastfeeding or pumping. You may want to start by applying warm, moist heat (in the shower or with warm water-soaked hand towels) just before feeding or pumping. This increases circulation and helps the milk flow. If your baby does not completely empty your breasts while breastfeeding, pump any extra milk after he or she is finished.  Wear a snug bra (nursing or regular) or tank top for 1 2 days to signal your body to slightly decrease milk production.  Apply ice packs to your breasts, unless this is too uncomfortable for you.  Make sure that your baby is latched on and positioned properly while breastfeeding. If engorgement persists after 48 hours of following these recommendations, contact your health care provider or a Science writer. OVERALL HEALTH CARE RECOMMENDATIONS WHILE BREASTFEEDING  Eat healthy foods. Alternate between meals and snacks, eating 3 of each per day. Because what you eat affects your breast milk, some of the foods may make your baby more irritable than usual. Avoid eating these foods if you are sure that they are negatively affecting your baby.  Drink milk, fruit juice, and water to satisfy your thirst (about 10 glasses a day).   Rest often, relax, and continue to take your prenatal vitamins to prevent fatigue, stress, and anemia.  Continue breast self-awareness checks.  Avoid chewing and smoking tobacco.  Avoid alcohol and drug use. Some medicines that may be harmful to your baby can pass through breast milk. It is important to ask your  health care provider before taking any medicine, including all over-the-counter and prescription medicine as well as vitamin and herbal supplements. It is possible to become pregnant while breastfeeding. If birth control is desired, ask your health care provider about options that will be safe for your baby. SEEK MEDICAL CARE IF:   You feel like you want to stop breastfeeding or have become frustrated with breastfeeding.  You have painful breasts or nipples.  Your nipples are cracked or bleeding.  Your breasts are red, tender, or warm.  You have a swollen area on either breast.  You have a fever or chills.  You have nausea or vomiting.  You have drainage other than breast milk from your nipples.  Your breasts do not become full before feedings by the 5th day after you give birth.  You feel sad and depressed.  Your baby is too sleepy to eat well.  Your baby is having trouble sleeping.   Your baby is wetting less than 3 diapers in a 24-hour period.  Your baby has less than 3 stools in a 24-hour period.  Your baby's skin or the white part of his or her eyes becomes yellow.   Your baby is not gaining weight by 48 days of age. SEEK IMMEDIATE MEDICAL CARE IF:   Your baby is overly tired (lethargic) and does not want to wake up and feed.  Your baby develops an unexplained fever. Document Released: 04/17/2005 Document Revised: 12/18/2012 Document Reviewed: 10/09/2012 [facility] Patient Information 2014 [place].

## 2013-05-12 NOTE — Addendum Note (Signed)
Addended by: Michel Harrow on: 05/12/2013 10:21 AM   Modules accepted: Orders

## 2013-05-12 NOTE — Progress Notes (Signed)
   Subjective:    Tabitha Holland is a [id number] [redacted]w[redacted]d being seen today for her first obstetrical visit.  Her obstetrical history is significant for hypertension and former smoker. Patient denies ever taking antihypertensive. Patient does intend to breast feed. Pregnancy history fully reviewed.  Patient reports rectal pressure.  Filed Vitals:   05/12/13 0821  BP: 145/92  Temp: 98.6 F (37 C)  Weight: 359 lb 6.4 oz (163.023 kg)    HISTORY: OB History  Gravida Para Term Preterm AB SAB TAB Ectopic Multiple Living  3 2 2  0 0 0 0 0 0 2    # Outcome Date GA Lbr Len/2nd Weight Sex Delivery Anes PTL Lv  3 CUR           2 TRM 03/13/09 [redacted]w[redacted]d   F SVD EPI  Y     Comments: mom unsure of weight; child adopted   1 TRM 05/10/07 [redacted]w[redacted]d  6 lb 15 oz (3.147 kg) M SVD EPI  Y     Past Medical History  Diagnosis Date  . Hypertension   . Morbid obesity    Past Surgical History  Procedure Laterality Date  . Cholecystectomy    . Wisdom tooth extraction     History reviewed. No pertinent family history.   Exam    Uterus:     Pelvic Exam:    Perineum: Normal Perineum   Vulva: normal   Vagina:  normal mucosa, normal discharge   pH:    Cervix: closed and long   Adnexa: no mass, fullness, tenderness   Bony Pelvis: android  System: Breast:  normal appearance, no masses or tenderness   Skin: normal coloration and turgor, no rashes    Neurologic: oriented, no focal deficits   Extremities: normal strength, tone, and muscle mass   HEENT extra ocular movement intact   Mouth/Teeth mucous membranes moist, pharynx normal without lesions   Neck supple and no masses   Cardiovascular: regular rate and rhythm   Respiratory:  chest clear, no wheezing, crepitations, rhonchi, normal symmetric air entry   Abdomen: soft, non-tender; bowel sounds normal; no masses,  no organomegaly   Urinary:       Assessment:    Pregnancy: [id number] Patient Active Problem List   Diagnosis Date Noted  .  Supervision of other normal pregnancy 05/12/2013  . Active smoker 05/12/2013  . Tobacco smoking complicating pregnancy in second trimester 05/12/2013        Plan:     Initial labs drawn. Prenatal vitamins. Problem list reviewed and updated. Genetic Screening discussed Quad Screen: ordered.  Ultrasound discussed; fetal survey: requested. Baseline labs ordered  Follow up in 3 weeks. 50% of 30 min visit spent on counseling and coordination of care.     Koni Kannan 05/12/2013

## 2013-05-12 NOTE — Progress Notes (Signed)
Pulse- 71 Patient reports rectal pain/pressure, denies hemorrhoids

## 2013-05-13 ENCOUNTER — Encounter: Payer: Self-pay | Admitting: Obstetrics and Gynecology

## 2013-05-13 DIAGNOSIS — O99210 Obesity complicating pregnancy, unspecified trimester: Secondary | ICD-10-CM | POA: Insufficient documentation

## 2013-05-13 LAB — PRESCRIPTION MONITORING PROFILE (19 PANEL)
Amphetamine/Meth: NEGATIVE ng/mL
Barbiturate Screen, Urine: NEGATIVE ng/mL
Benzodiazepine Screen, Urine: NEGATIVE ng/mL
Buprenorphine, Urine: NEGATIVE ng/mL
CANNABINOID SCRN UR: NEGATIVE ng/mL
COCAINE METABOLITES: NEGATIVE ng/mL
CREATININE, URINE: 99.85 mg/dL (ref >20.0)
Carisoprodol, Urine: NEGATIVE ng/mL
ECSTASY: NEGATIVE ng/mL
FENTANYL URINE: NEGATIVE ng/mL
MEPERIDINE UR: NEGATIVE ng/mL
METHADONE SCREEN, URINE: NEGATIVE ng/mL
Methaqualone: NEGATIVE ng/mL
Nitrites, Initial: NEGATIVE ug/mL
Opiate Screen, Urine: NEGATIVE ng/mL
Oxycodone Screen, Ur: NEGATIVE ng/mL
Phencyclidine, Ur: NEGATIVE ng/mL
Propoxyphene: NEGATIVE ng/mL
Tapentadol, urine: NEGATIVE ng/mL
Tramadol Scrn, Ur: NEGATIVE ng/mL
Zolpidem, Urine: NEGATIVE ng/mL
pH, Initial: 7.7 pH (ref 4.5–8.9)

## 2013-05-13 LAB — OBSTETRIC PANEL
Antibody Screen: NEGATIVE
BASOS PCT: 0 % (ref 0–1)
Basophils Absolute: 0 10*3/uL (ref 0.0–0.1)
Eosinophils Absolute: 0 10*3/uL (ref 0.0–0.7)
Eosinophils Relative: 1 % (ref 0–5)
HCT: 32.1 % — ABNORMAL LOW (ref 36.0–46.0)
HEMOGLOBIN: 11.1 g/dL — AB (ref 12.0–15.0)
HEP B S AG: NEGATIVE
Lymphocytes Relative: 23 % (ref 12–46)
Lymphs Abs: 1.7 10*3/uL (ref 0.7–4.0)
MCH: 28.9 pg (ref 26.0–34.0)
MCHC: 34.6 g/dL (ref 30.0–36.0)
MCV: 83.6 fL (ref 78.0–100.0)
MONOS PCT: 6 % (ref 3–12)
Monocytes Absolute: 0.4 10*3/uL (ref 0.1–1.0)
NEUTROS ABS: 5.2 10*3/uL (ref 1.7–7.7)
NEUTROS PCT: 70 % (ref 43–77)
Platelets: 214 10*3/uL (ref 150–400)
RBC: 3.84 MIL/uL — AB (ref 3.87–5.11)
RDW: 14.3 % (ref 11.5–15.5)
Rh Type: POSITIVE
Rubella: 0.69 Index (ref <0.90)
WBC: 7.3 10*3/uL (ref 4.0–10.5)

## 2013-05-14 LAB — HEMOGLOBINOPATHY EVALUATION
HEMOGLOBIN OTHER: 0 %
HGB A2 QUANT: 2.6 % (ref 2.2–3.2)
Hgb A: 97.4 % (ref 96.8–97.8)
Hgb F Quant: 0 % (ref 0.0–2.0)
Hgb S Quant: 0 %

## 2013-05-14 LAB — AFP, QUAD SCREEN
AFP: 9.3 IU/mL
Curr Gest Age: 14.2 wks.days
Down Syndrome Scr Risk Est: 1:328 {titer}
HCG TOTAL: 32296 m[IU]/mL
INH: 162.6 pg/mL
Interpretation-AFP: NEGATIVE
MOM FOR HCG: 1.52
MOM FOR INH: 1.29
MoM for AFP: 0.73
OPEN SPINA BIFIDA: NEGATIVE
Osb Risk: <1:54600 {titer}
TRI 18 SCR RISK EST: NEGATIVE
Trisomy 18 (Edward) Syndrome Interp.: 1:10900 {titer}
uE3 Mom: 0.56
uE3 Value: <0.1 ng/mL

## 2013-05-15 ENCOUNTER — Encounter: Payer: Self-pay | Admitting: Obstetrics and Gynecology

## 2013-05-17 LAB — CULTURE, OB URINE: Colony Count: >=100000

## 2013-05-19 ENCOUNTER — Telehealth: Payer: Self-pay

## 2013-05-19 ENCOUNTER — Other Ambulatory Visit: Payer: Self-pay | Admitting: Obstetrics and Gynecology

## 2013-05-19 MED ORDER — CEPHALEXIN 500 MG PO CAPS: 500.0000 mg | ORAL_CAPSULE | Freq: Four times a day (QID) | ORAL | Status: DC

## 2013-05-19 NOTE — Telephone Encounter (Signed)
Called pt. And informed her of UTI and medication waiting for her at her pharmacy. Pt. Verbalized understanding and had no further questions or concerns.

## 2013-05-19 NOTE — Telephone Encounter (Signed)
Message copied by Geanie Logan on Mon May 19, 2013 12:58 PM ------      Message from: CONSTANT, [place]      Created: Mon May 19, 2013 12:14 PM       Please inform patient of positive UTI. Rx keflex e-prescribed            Thanks            Peggy ------

## 2013-05-30 ENCOUNTER — Inpatient Hospital Stay (HOSPITAL_COMMUNITY)
Admission: AD | Admit: 2013-05-30 | Discharge: 2013-05-30 | Disposition: A | Discharge status: Home / Self Care | Payer: BC Managed Care – PPO | Source: Ambulatory Visit | Attending: Obstetrics & Gynecology | Admitting: Obstetrics & Gynecology

## 2013-05-30 ENCOUNTER — Encounter (HOSPITAL_COMMUNITY): Payer: Self-pay

## 2013-05-30 DIAGNOSIS — O99330 Smoking (tobacco) complicating pregnancy, unspecified trimester: Secondary | ICD-10-CM | POA: Insufficient documentation

## 2013-05-30 DIAGNOSIS — O99891 Other specified diseases and conditions complicating pregnancy: Secondary | ICD-10-CM | POA: Insufficient documentation

## 2013-05-30 DIAGNOSIS — O9989 Other specified diseases and conditions complicating pregnancy, childbirth and the puerperium: Principal | ICD-10-CM

## 2013-05-30 DIAGNOSIS — E669 Obesity, unspecified: Secondary | ICD-10-CM | POA: Insufficient documentation

## 2013-05-30 DIAGNOSIS — N949 Unspecified condition associated with female genital organs and menstrual cycle: Secondary | ICD-10-CM

## 2013-05-30 DIAGNOSIS — O99210 Obesity complicating pregnancy, unspecified trimester: Secondary | ICD-10-CM

## 2013-05-30 DIAGNOSIS — E860 Dehydration: Secondary | ICD-10-CM

## 2013-05-30 DIAGNOSIS — O10019 Pre-existing essential hypertension complicating pregnancy, unspecified trimester: Secondary | ICD-10-CM | POA: Insufficient documentation

## 2013-05-30 DIAGNOSIS — R109 Unspecified abdominal pain: Secondary | ICD-10-CM | POA: Insufficient documentation

## 2013-05-30 LAB — URINALYSIS, ROUTINE W REFLEX MICROSCOPIC
Bilirubin Urine: NEGATIVE
GLUCOSE, UA: NEGATIVE mg/dL
Hgb urine dipstick: NEGATIVE
Nitrite: NEGATIVE
PROTEIN: 30 mg/dL — AB
Specific Gravity, Urine: >1.03 — ABNORMAL HIGH (ref 1.005–1.030)
Urobilinogen, UA: 1 mg/dL (ref 0.0–1.0)
pH: 6 (ref 5.0–8.0)

## 2013-05-30 LAB — URINE MICROSCOPIC-ADD ON

## 2013-05-30 MED ORDER — ACETAMINOPHEN 500 MG PO TABS
1000.0000 mg | ORAL_TABLET | Freq: Once | ORAL | Status: AC
  Administered 2013-05-30: 1000 mg via ORAL
  Filled 2013-05-30: qty 2

## 2013-05-30 NOTE — MAU Provider Note (Signed)
No chief complaint on file.   S: Tabitha Holland is a 28 y.o. G3P2002 at [redacted]w[redacted]d weeks presenting with several day hx of sharp crampy pain in bilateral lower abdomen and groin. The pain is exacerbated by walking and changing positions. Usually gets some relief from Tylenol but today at work in [employer] it was more persistent and associated with pelvic pressure. No dysuria, urgency or frequency. She denies contractions, vaginal bleeding or leakage of fluid. Fetus is active. No N/V. Last food was breakfast 8 hr ago.  States she drinks a lot.  ROS: Negative except as noted above.  Pregnancy course: CHTN, smoker, morbid obesity; care at [facility]; early OGTT nl  Problem list, past medical history, Ob/Gyn history, surgical history, family history and social history reviewed.  O: Filed Vitals:   05/30/13 1633  BP: 122/79  Pulse: 63  Temp: 98.1 F (36.7 C)  Resp: 18    Gen: NAD Abd: soft, obese, fundus at lower U, mildly tender in lower abd and groin Rt>Lt Back: neg CVAT VE: Cx L/C/H FHR: DT 158   Results for orders placed during the hospital encounter of 05/30/13 (from the past 24 hour(s))  URINALYSIS, ROUTINE W REFLEX MICROSCOPIC     Status: Abnormal   Collection Time    05/30/13  4:40 PM      Result Value Range   Color, Urine ORANGE (*) YELLOW   APPearance HAZY (*) CLEAR   Specific Gravity, Urine >1.030 (*) 1.005 - 1.030   pH 6.0  5.0 - 8.0   Glucose, UA NEGATIVE  NEGATIVE mg/dL   Hgb urine dipstick NEGATIVE  NEGATIVE   Bilirubin Urine NEGATIVE  NEGATIVE   Ketones, ur >80 (*) NEGATIVE mg/dL   Protein, ur 30 (*) NEGATIVE mg/dL   Urobilinogen, UA 1.0  0.0 - 1.0 mg/dL   Nitrite NEGATIVE  NEGATIVE   Leukocytes, UA SMALL (*) NEGATIVE  URINE MICROSCOPIC-ADD ON     Status: Abnormal   Collection Time    05/30/13  4:40 PM      Result Value Range   Squamous Epithelial / LPF MANY (*) RARE   WBC, UA 11-20  <3 WBC/hpf   Bacteria, UA FEW (*) RARE   Urine-Other MUCOUS PRESENT       MAU course: Acetaminophen 1000mg  po given with some relief Urine C&S sent  A: Round Ligament Pain Dehydration due to skipped meal G3P2002 at [redacted]w[redacted]d  P:  Reassurance given and general relief measures reviewed: avoidance of precipitating movements, instructions on abdominal tightening/pelvic rock exercises, abdominal binder, rest with hip flexion. Acetaminophen for pain not to exceed 4Gm/day Increase fluids and do not skip meals Follow-up Information   Follow up with [facility] On 06/17/2013.    Note for light work x 1 wk

## 2013-05-30 NOTE — MAU Note (Signed)
Pt states here for lower abdominal pain and vaginal pressure. Left work early and took warm shower which didn't help. Denies abnormal vaginal discharge or bleeding. Denies uti s/s. Pain is constant and feels differently from usual round ligament pain.

## 2013-06-01 LAB — URINE CULTURE

## 2013-06-02 ENCOUNTER — Encounter: Payer: Self-pay | Admitting: Obstetrics & Gynecology

## 2013-06-02 NOTE — MAU Provider Note (Signed)
Attestation of Attending Supervision of Advanced Practitioner (CNM/NP): Evaluation and management procedures were performed by the Advanced Practitioner under my supervision and collaboration. I have reviewed the Advanced Practitioner's note and chart, and I agree with the management and plan.  Cecilee Rosner H. 12:50 PM

## 2013-06-17 ENCOUNTER — Ambulatory Visit (HOSPITAL_COMMUNITY)
Admission: RE | Admit: 2013-06-17 | Discharge: 2013-06-17 | Disposition: A | Discharge status: Home / Self Care | Payer: BC Managed Care – PPO | Source: Ambulatory Visit | Attending: Obstetrics and Gynecology | Admitting: Obstetrics and Gynecology

## 2013-06-17 ENCOUNTER — Ambulatory Visit (INDEPENDENT_AMBULATORY_CARE_PROVIDER_SITE_OTHER): Payer: BC Managed Care – PPO | Admitting: Obstetrics and Gynecology

## 2013-06-17 ENCOUNTER — Encounter: Payer: Self-pay | Admitting: Obstetrics and Gynecology

## 2013-06-17 ENCOUNTER — Other Ambulatory Visit: Payer: Self-pay | Admitting: Obstetrics and Gynecology

## 2013-06-17 VITALS — BP 128/74 | Temp 96.9°F | Wt 354.1 lb

## 2013-06-17 DIAGNOSIS — O99212 Obesity complicating pregnancy, second trimester: Secondary | ICD-10-CM

## 2013-06-17 DIAGNOSIS — Z1389 Encounter for screening for other disorder: Secondary | ICD-10-CM | POA: Insufficient documentation

## 2013-06-17 DIAGNOSIS — E669 Obesity, unspecified: Secondary | ICD-10-CM

## 2013-06-17 DIAGNOSIS — O358XX0 Maternal care for other (suspected) fetal abnormality and damage, not applicable or unspecified: Secondary | ICD-10-CM | POA: Insufficient documentation

## 2013-06-17 DIAGNOSIS — Z363 Encounter for antenatal screening for malformations: Secondary | ICD-10-CM | POA: Insufficient documentation

## 2013-06-17 DIAGNOSIS — O99210 Obesity complicating pregnancy, unspecified trimester: Secondary | ICD-10-CM

## 2013-06-17 DIAGNOSIS — Z3480 Encounter for supervision of other normal pregnancy, unspecified trimester: Secondary | ICD-10-CM

## 2013-06-17 DIAGNOSIS — I10 Essential (primary) hypertension: Secondary | ICD-10-CM

## 2013-06-17 DIAGNOSIS — O169 Unspecified maternal hypertension, unspecified trimester: Secondary | ICD-10-CM

## 2013-06-17 DIAGNOSIS — O09299 Supervision of pregnancy with other poor reproductive or obstetric history, unspecified trimester: Secondary | ICD-10-CM | POA: Insufficient documentation

## 2013-06-17 LAB — POCT URINALYSIS DIP (DEVICE)
Bilirubin Urine: NEGATIVE
Glucose, UA: NEGATIVE mg/dL
HGB URINE DIPSTICK: NEGATIVE
KETONES UR: NEGATIVE mg/dL
Nitrite: NEGATIVE
PH: 7 (ref 5.0–8.0)
PROTEIN: NEGATIVE mg/dL
SPECIFIC GRAVITY, URINE: 1.025 (ref 1.005–1.030)
Urobilinogen, UA: 0.2 mg/dL (ref 0.0–1.0)

## 2013-06-17 NOTE — Progress Notes (Signed)
P=79 

## 2013-06-17 NOTE — Progress Notes (Signed)
Patient is doing well without complaints. Scheduled for anatomy ultrasound today. History again reviewed and patient denies h/o HTN or taking any medications for HTN. She was induced at 40 weeks for elevated BP but denies receiving Magnesium sulfate. Will obtain baseline labs today.

## 2013-06-19 ENCOUNTER — Encounter: Payer: Self-pay | Admitting: Obstetrics and Gynecology

## 2013-07-05 ENCOUNTER — Encounter: Payer: Self-pay | Admitting: *Deleted

## 2013-07-18 ENCOUNTER — Encounter: Payer: Self-pay | Admitting: Obstetrics and Gynecology

## 2013-07-18 ENCOUNTER — Ambulatory Visit (INDEPENDENT_AMBULATORY_CARE_PROVIDER_SITE_OTHER): Payer: BC Managed Care – PPO | Admitting: Obstetrics and Gynecology

## 2013-07-18 VITALS — BP 132/83 | Temp 98.2°F | Wt 358.7 lb

## 2013-07-18 DIAGNOSIS — E669 Obesity, unspecified: Secondary | ICD-10-CM

## 2013-07-18 DIAGNOSIS — IMO0002 Reserved for concepts with insufficient information to code with codable children: Secondary | ICD-10-CM

## 2013-07-18 DIAGNOSIS — O99210 Obesity complicating pregnancy, unspecified trimester: Secondary | ICD-10-CM

## 2013-07-18 DIAGNOSIS — O169 Unspecified maternal hypertension, unspecified trimester: Secondary | ICD-10-CM

## 2013-07-18 LAB — POCT URINALYSIS DIP (DEVICE)
Bilirubin Urine: NEGATIVE
Glucose, UA: NEGATIVE mg/dL
KETONES UR: NEGATIVE mg/dL
Nitrite: NEGATIVE
PH: 7 (ref 5.0–8.0)
PROTEIN: 30 mg/dL — AB
UROBILINOGEN UA: 0.2 mg/dL (ref 0.0–1.0)

## 2013-07-18 LAB — CBC
HCT: 29.7 % — ABNORMAL LOW (ref 36.0–46.0)
HEMOGLOBIN: 10.5 g/dL — AB (ref 12.0–15.0)
MCH: 29.8 pg (ref 26.0–34.0)
MCHC: 35.4 g/dL (ref 30.0–36.0)
MCV: 84.4 fL (ref 78.0–100.0)
Platelets: 218 10*3/uL (ref 150–400)
RBC: 3.52 MIL/uL — AB (ref 3.87–5.11)
RDW: 14.8 % (ref 11.5–15.5)
WBC: 8 10*3/uL (ref 4.0–10.5)

## 2013-07-18 LAB — COMPREHENSIVE METABOLIC PANEL
ALK PHOS: 90 U/L (ref 39–117)
ALT: 11 U/L (ref 0–35)
AST: 13 U/L (ref 0–37)
Albumin: 3.2 g/dL — ABNORMAL LOW (ref 3.5–5.2)
BILIRUBIN TOTAL: 0.4 mg/dL (ref 0.2–1.2)
BUN: 4 mg/dL — AB (ref 6–23)
CO2: 25 meq/L (ref 19–32)
CREATININE: 0.54 mg/dL (ref 0.50–1.10)
Calcium: 8.8 mg/dL (ref 8.4–10.5)
Chloride: 105 mEq/L (ref 96–112)
GLUCOSE: 76 mg/dL (ref 70–99)
Potassium: 3.8 mEq/L (ref 3.5–5.3)
Sodium: 137 mEq/L (ref 135–145)
Total Protein: 6.2 g/dL (ref 6.0–8.3)

## 2013-07-18 NOTE — Progress Notes (Signed)
Still smokes some> partner very motivated to have her quit. Tired. Works Medical laboratory scientific officer at [employer]. USfor F/U anatomy scheduled. MIld BP elevations pre pregnancy but never on antiHTN med. Reviewed CHTN protocols.

## 2013-07-18 NOTE — Patient Instructions (Signed)
Smoking Cessation Quitting smoking is important to your health and has many advantages. However, it is not always easy to quit since nicotine is a very addictive drug. Often times, people try 3 times or more before being able to quit. This document explains the best ways for you to prepare to quit smoking. Quitting takes hard work and a lot of effort, but you can do it. ADVANTAGES OF QUITTING SMOKING  You will live longer, feel better, and live better.  Your body will feel the impact of quitting smoking almost immediately.  Within 20 minutes, blood pressure decreases. Your pulse returns to its normal level.  After 8 hours, carbon monoxide levels in the blood return to normal. Your oxygen level increases.  After 24 hours, the chance of having a heart attack starts to decrease. Your breath, hair, and body stop smelling like smoke.  After 48 hours, damaged nerve endings begin to recover. Your sense of taste and smell improve.  After 72 hours, the body is virtually free of nicotine. Your bronchial tubes relax and breathing becomes easier.  After 2 to 12 weeks, lungs can hold more air. Exercise becomes easier and circulation improves.  The risk of having a heart attack, stroke, cancer, or lung disease is greatly reduced.  After 1 year, the risk of coronary heart disease is cut in half.  After 5 years, the risk of stroke falls to the same as a nonsmoker.  After 10 years, the risk of lung cancer is cut in half and the risk of other cancers decreases significantly.  After 15 years, the risk of coronary heart disease drops, usually to the level of a nonsmoker.  If you are pregnant, quitting smoking will improve your chances of having a healthy baby.  The people you live with, especially any children, will be healthier.  You will have extra money to spend on things other than cigarettes. QUESTIONS TO THINK ABOUT BEFORE ATTEMPTING TO QUIT You may want to talk about your answers with your  caregiver.  Why do you want to quit?  If you tried to quit in the past, what helped and what did not?  What will be the most difficult situations for you after you quit? How will you plan to handle them?  Who can help you through the tough times? Your family? Friends? A caregiver?  What pleasures do you get from smoking? What ways can you still get pleasure if you quit? Here are some questions to ask your caregiver:  How can you help me to be successful at quitting?  What medicine do you think would be best for me and how should I take it?  What should I do if I need more help?  What is smoking withdrawal like? How can I get information on withdrawal? GET READY  Set a quit date.  Change your environment by getting rid of all cigarettes, ashtrays, matches, and lighters in your home, car, or work. Do not let people smoke in your home.  Review your past attempts to quit. Think about what worked and what did not. GET SUPPORT AND ENCOURAGEMENT You have a better chance of being successful if you have help. You can get support in many ways.  Tell your family, friends, and co-workers that you are going to quit and need their support. Ask them not to smoke around you.  Get individual, group, or telephone counseling and support. Programs are available at local hospitals and health centers. Call your local health department for   information about programs in your area.  Spiritual beliefs and practices may help some smokers quit.  Download a "quit meter" on your computer to keep track of quit statistics, such as how long you have gone without smoking, cigarettes not smoked, and money saved.  Get a self-help book about quitting smoking and staying off of tobacco. LEARN NEW SKILLS AND BEHAVIORS  Distract yourself from urges to smoke. Talk to someone, go for a walk, or occupy your time with a task.  Change your normal routine. Take a different route to work. Drink tea instead of coffee.  Eat breakfast in a different place.  Reduce your stress. Take a hot bath, exercise, or read a book.  Plan something enjoyable to do every day. Reward yourself for not smoking.  Explore interactive web-based programs that specialize in helping you quit. GET MEDICINE AND USE IT CORRECTLY Medicines can help you stop smoking and decrease the urge to smoke. Combining medicine with the above behavioral methods and support can greatly increase your chances of successfully quitting smoking.  Nicotine replacement therapy helps deliver nicotine to your body without the negative effects and risks of smoking. Nicotine replacement therapy includes nicotine gum, lozenges, inhalers, nasal sprays, and skin patches. Some may be available over-the-counter and others require a prescription.  Antidepressant medicine helps people abstain from smoking, but how this works is unknown. This medicine is available by prescription.  Nicotinic receptor partial agonist medicine simulates the effect of nicotine in your brain. This medicine is available by prescription. Ask your caregiver for advice about which medicines to use and how to use them based on your health history. Your caregiver will tell you what side effects to look out for if you choose to be on a medicine or therapy. Carefully read the information on the package. Do not use any other product containing nicotine while using a nicotine replacement product.  RELAPSE OR DIFFICULT SITUATIONS Most relapses occur within the first 3 months after quitting. Do not be discouraged if you start smoking again. Remember, most people try several times before finally quitting. You may have symptoms of withdrawal because your body is used to nicotine. You may crave cigarettes, be irritable, feel very hungry, cough often, get headaches, or have difficulty concentrating. The withdrawal symptoms are only temporary. They are strongest when you first quit, but they will go away within  10 14 days. To reduce the chances of relapse, try to:  Avoid drinking alcohol. Drinking lowers your chances of successfully quitting.  Reduce the amount of caffeine you consume. Once you quit smoking, the amount of caffeine in your body increases and can give you symptoms, such as a rapid heartbeat, sweating, and anxiety.  Avoid smokers because they can make you want to smoke.  Do not let weight gain distract you. Many smokers will gain weight when they quit, usually less than 10 pounds. Eat a healthy diet and stay active. You can always lose the weight gained after you quit.  Find ways to improve your mood other than smoking. FOR MORE INFORMATION  www.smokefree.gov  Document Released: 04/11/2001 Document Revised: 10/17/2011 Document Reviewed: 07/27/2011 [employer] Patient Information 2014 [employer].  

## 2013-07-18 NOTE — Progress Notes (Signed)
P= 74 Pt. Dropped off 24hr urine. CBC and metabolic panel to be drawn.

## 2013-07-19 LAB — PROTEIN, URINE, 24 HOUR
PROTEIN, URINE: 6 mg/dL
Protein, 24H Urine: 57 mg/d (ref 50–100)

## 2013-08-08 ENCOUNTER — Encounter (HOSPITAL_COMMUNITY): Payer: Self-pay | Admitting: *Deleted

## 2013-08-08 ENCOUNTER — Encounter: Payer: Self-pay | Admitting: Family Medicine

## 2013-08-08 ENCOUNTER — Inpatient Hospital Stay (HOSPITAL_COMMUNITY)
Admission: AD | Admit: 2013-08-08 | Discharge: 2013-08-08 | Disposition: A | Discharge status: Home / Self Care | Payer: BC Managed Care – PPO | Source: Ambulatory Visit | Attending: Family Medicine | Admitting: Family Medicine

## 2013-08-08 DIAGNOSIS — R109 Unspecified abdominal pain: Secondary | ICD-10-CM

## 2013-08-08 DIAGNOSIS — M549 Dorsalgia, unspecified: Secondary | ICD-10-CM

## 2013-08-08 DIAGNOSIS — S29012A Strain of muscle and tendon of back wall of thorax, initial encounter: Secondary | ICD-10-CM

## 2013-08-08 DIAGNOSIS — Y93K9 Activity, other involving animal care: Secondary | ICD-10-CM | POA: Insufficient documentation

## 2013-08-08 DIAGNOSIS — X500XXA Overexertion from strenuous movement or load, initial encounter: Secondary | ICD-10-CM | POA: Insufficient documentation

## 2013-08-08 DIAGNOSIS — Y9229 Other specified public building as the place of occurrence of the external cause: Secondary | ICD-10-CM | POA: Insufficient documentation

## 2013-08-08 DIAGNOSIS — Z87891 Personal history of nicotine dependence: Secondary | ICD-10-CM

## 2013-08-08 DIAGNOSIS — O99891 Other specified diseases and conditions complicating pregnancy: Secondary | ICD-10-CM

## 2013-08-08 DIAGNOSIS — O9989 Other specified diseases and conditions complicating pregnancy, childbirth and the puerperium: Principal | ICD-10-CM

## 2013-08-08 LAB — URINALYSIS, ROUTINE W REFLEX MICROSCOPIC
BILIRUBIN URINE: NEGATIVE
Glucose, UA: NEGATIVE mg/dL
HGB URINE DIPSTICK: NEGATIVE
Ketones, ur: NEGATIVE mg/dL
NITRITE: NEGATIVE
PROTEIN: NEGATIVE mg/dL
SPECIFIC GRAVITY, URINE: 1.02 (ref 1.005–1.030)
Urobilinogen, UA: 1 mg/dL (ref 0.0–1.0)
pH: 6.5 (ref 5.0–8.0)

## 2013-08-08 LAB — URINE MICROSCOPIC-ADD ON

## 2013-08-08 MED ORDER — CYCLOBENZAPRINE HCL 5 MG PO TABS: 2.5000 mg | ORAL_TABLET | Freq: Two times a day (BID) | ORAL | Status: DC | PRN

## 2013-08-08 MED ORDER — ACETAMINOPHEN 500 MG PO TABS
1000.0000 mg | ORAL_TABLET | Freq: Once | ORAL | Status: AC
  Administered 2013-08-08: 1000 mg via ORAL
  Filled 2013-08-08: qty 2

## 2013-08-08 NOTE — MAU Provider Note (Signed)
@[email or web address] @  Chief Complaint:  Abdominal Pain   Tabitha Holland is  28 y.o. 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 No pain immediately, but felt some pain 15 min after incident, Right mid-back radiating across ribcage to RUQ abd, severe sharp pain 9/10, since pain has been mostly constant but does ease for about 2-3 hours, worsened by most movements (lifting, turning), some relief with Tylenol (1000mg  per dose x 1 daily, yesterday and today 12 hours ago), also improved with bed rest. Currently 5-6/10 pain.  Note allergy/interolance - Percocet, Hydrocodone - HA with upset stomach   She states none contractions are associated with none vaginal bleeding, intact membranes (denies LOF), along with active fetal movement.  ROS: Denies fever/chills, nausea/vomiting.  Obstetrical/Gynecological History: OB History   Grav Para Term Preterm Abortions TAB SAB Ect Mult Living   3 2 2  0 0 0 0 0 0 2     Past Medical History: Past Medical History  Diagnosis Date  . Hypertension   . Morbid obesity     Past Surgical History: Past Surgical History  Procedure Laterality Date  . Cholecystectomy    . Wisdom tooth extraction      Family History: History reviewed. No pertinent family history.  Social History: History  Substance Use Topics  . Smoking status: Former Smoker -- 0.25 packs/day    Types: Cigarettes    Quit date: 08/08/2013  . Smokeless tobacco: Not on file  . Alcohol Use: Yes     Comment: ocassional    Allergies:  Allergies  Allergen Reactions  . Codeine Other (See Comments)    migraines  . Hydrocodone Other (See Comments)    migraines  . Percocet [Oxycodone-Acetaminophen] Other (See Comments)    migraines    Meds:  Prescriptions prior to admission  Medication Sig Dispense Refill  . acetaminophen (TYLENOL) 500 MG tablet  Take 1,000 mg by mouth every 6 (six) hours as needed for mild pain or headache.       . Prenatal Vit-Fe Fumarate-FA (PRENATAL MULTIVITAMIN) TABS tablet Take 1 tablet by mouth every morning.        Physical Exam  Blood pressure 124/85, pulse 68, temperature 97.9 F (36.6 C), temperature source Oral, resp. rate 22, height 5\' 11"  (1.803 m), weight 163.386 kg (360 lb 3.2 oz), last menstrual period 02/01/2013, SpO2 100.00%. GENERAL: morbid obesity, well-appearing, conversational, comfortable, NAD  LUNGS: Clear to auscultation bilaterally.  HEART: Regular rate and rhythm. ABDOMEN: Soft, mild TTP RUQ, nondistended, gravid. MSK: No TTP midline spinous processes, right mid thoracic paraspinal and R-lateral intercostal muscles mild TTP, appropriate ROM with mild pain on rotation / extension   EXTREMITIES: Nontender, no edema, 2+ distal pulses. DTR's 2+ Pelvic Exam: Not performed FHT:  Baseline rate 140 bpm   Variability moderate  Accelerations absent   Decelerations none Contractions: none  Labs: Results for orders placed during the hospital encounter of 08/08/13 (from the past 24 hour(s))  URINALYSIS, ROUTINE W REFLEX MICROSCOPIC   Collection Time    08/08/13  8:19 AM      Result Value Ref Range   Color, Urine YELLOW  YELLOW   APPearance CLOUDY (*) CLEAR   Specific Gravity, Urine 1.020  1.005 - 1.030   pH 6.5  5.0 - 8.0   Glucose, UA NEGATIVE  NEGATIVE mg/dL   Hgb urine dipstick NEGATIVE  NEGATIVE   Bilirubin Urine NEGATIVE  NEGATIVE   Ketones, ur NEGATIVE  NEGATIVE mg/dL   Protein, ur NEGATIVE  NEGATIVE mg/dL   Urobilinogen, UA 1.0  0.0 - 1.0 mg/dL   Nitrite NEGATIVE  NEGATIVE   Leukocytes, UA LARGE (*) NEGATIVE  URINE MICROSCOPIC-ADD ON   Collection Time    08/08/13  8:19 AM      Result Value Ref Range   Squamous Epithelial / LPF MANY (*) RARE   WBC, UA 21-50  <3 WBC/hpf   Bacteria, UA FEW (*) RARE   Imaging Studies:  No results found.  Assessment: Tabitha Holland is  28  y.o. G3P2002 at 108w6d presented with acute onset R-mid back / flank pain following heavy lifting injury at work (4 days ago). Most likely MSK etiology given improvement with Tylenol and bedrest, exacerbated by turning / lifting. FHT reactive and reassuring, history not concerning for pregnancy concerns. Reviewed UA (suspect contamination, non-cath specimen), patient is asymptomatic.  UPDATE: 1100 - Pain improved s/p Tylenol to 3/10.  Plan: - Tylenol 1000mg  x 1 dose - Continue Tylenol on discharge 650 q 6 hr or 1000mg  TID, scheduled for 3 days then PRN - Rx Flexeril 2.5mg  (half 5mg  tabs) BID PRN - Try heating pad, relative rest, avoid heavy lifting, discussed proper lift techniques - Out of work today, return Monday - no antibiotics at this time, re-check UA at f/u clinic visit next Friday - Discharge to home  Nobie Putnam 4/10/201511:15 AM  Evaluation and management procedures were performed by Resident physician under my supervision/collaboration. Chart reviewed, patient examined by me and I agree with management and plan. Lorene Dy, CNM 08/08/2013 11:17 AM

## 2013-08-08 NOTE — MAU Note (Signed)
Patient states she works at the [employer] and lifted a heavy dog several days ago and had been having mid abdominal pain. Denies bleeding or leaking and reports feeling fetal movement.

## 2013-08-08 NOTE — Discharge Instructions (Signed)
Back Pain, Adult Low back pain is very common. About 1 in 5 people have back pain.The cause of low back pain is rarely dangerous. The pain often gets better over time.About half of people with a sudden onset of back pain feel better in just 2 weeks. About 8 in 10 people feel better by 6 weeks.  CAUSES Some common causes of back pain include:  Strain of the muscles or ligaments supporting the spine.  Wear and tear (degeneration) of the spinal discs.  Arthritis.  Direct injury to the back. DIAGNOSIS Most of the time, the direct cause of low back pain is not known.However, back pain can be treated effectively even when the exact cause of the pain is unknown.Answering your caregiver's questions about your overall health and symptoms is one of the most accurate ways to make sure the cause of your pain is not dangerous. If your caregiver needs more information, he or she may order lab work or imaging tests (X-rays or MRIs).However, even if imaging tests show changes in your back, this usually does not require surgery. HOME CARE INSTRUCTIONS For many people, back pain returns.Since low back pain is rarely dangerous, it is often a condition that people can learn to manageon their own.   Remain active. It is stressful on the back to sit or stand in one place. Do not sit, drive, or stand in one place for more than 30 minutes at a time. Take short walks on level surfaces as soon as pain allows.Try to increase the length of time you walk each day.  Do not stay in bed.Resting more than 1 or 2 days can delay your recovery.  Do not avoid exercise or work.Your body is made to move.It is not dangerous to be active, even though your back may hurt.Your back will likely heal faster if you return to being active before your pain is gone.  Pay attention to your body when you bend and lift. Many people have less discomfortwhen lifting if they bend their knees, keep the load close to their bodies,and  avoid twisting. Often, the most comfortable positions are those that put less stress on your recovering back.  Find a comfortable position to sleep. Use a firm mattress and lie on your side with your knees slightly bent. If you lie on your back, put a pillow under your knees.  Only take over-the-counter or prescription medicines as directed by your caregiver. Over-the-counter medicines to reduce pain and inflammation are often the most helpful.Your caregiver may prescribe muscle relaxant drugs.These medicines help dull your pain so you can more quickly return to your normal activities and healthy exercise.  Put ice on the injured area.  Put ice in a plastic bag.  Place a towel between your skin and the bag.  Leave the ice on for 15-20 minutes, 03-04 times a day for the first 2 to 3 days. After that, ice and heat may be alternated to reduce pain and spasms.  Ask your caregiver about trying back exercises and gentle massage. This may be of some benefit.  Avoid feeling anxious or stressed.Stress increases muscle tension and can worsen back pain.It is important to recognize when you are anxious or stressed and learn ways to manage it.Exercise is a great option. SEEK MEDICAL CARE IF:  You have pain that is not relieved with rest or medicine.  You have pain that does not improve in 1 week.  You have new symptoms.  You are generally not feeling well. SEEK   IMMEDIATE MEDICAL CARE IF:   You have pain that radiates from your back into your legs.  You develop new bowel or bladder control problems.  You have unusual weakness or numbness in your arms or legs.  You develop nausea or vomiting.  You develop abdominal pain.  You feel faint. Document Released: 04/17/2005 Document Revised: 10/17/2011 Document Reviewed: 09/05/2010 [employer] Patient Information 2014 [employer].  

## 2013-08-08 NOTE — MAU Provider Note (Signed)
Attestation of Attending Supervision of Advanced Practitioner (PA/CNM/NP): Evaluation and management procedures were performed by the Advanced Practitioner under my supervision and collaboration.  I have reviewed the Advanced Practitioner's note and chart, and I agree with the management and plan.  Cassaundra Rasch, DO Attending Physician Faculty Practice, [facility]  

## 2013-08-15 ENCOUNTER — Ambulatory Visit (HOSPITAL_COMMUNITY)
Admission: RE | Admit: 2013-08-15 | Discharge: 2013-08-15 | Disposition: A | Discharge status: Home / Self Care | Payer: BC Managed Care – PPO | Source: Ambulatory Visit | Attending: Obstetrics and Gynecology | Admitting: Obstetrics and Gynecology

## 2013-08-15 ENCOUNTER — Ambulatory Visit (INDEPENDENT_AMBULATORY_CARE_PROVIDER_SITE_OTHER): Payer: BC Managed Care – PPO | Admitting: Obstetrics and Gynecology

## 2013-08-15 VITALS — BP 128/84 | Temp 98.2°F | Wt 358.1 lb

## 2013-08-15 DIAGNOSIS — IMO0002 Reserved for concepts with insufficient information to code with codable children: Secondary | ICD-10-CM

## 2013-08-15 DIAGNOSIS — I10 Essential (primary) hypertension: Secondary | ICD-10-CM

## 2013-08-15 DIAGNOSIS — E669 Obesity, unspecified: Secondary | ICD-10-CM | POA: Insufficient documentation

## 2013-08-15 DIAGNOSIS — O09299 Supervision of pregnancy with other poor reproductive or obstetric history, unspecified trimester: Secondary | ICD-10-CM | POA: Insufficient documentation

## 2013-08-15 DIAGNOSIS — O169 Unspecified maternal hypertension, unspecified trimester: Secondary | ICD-10-CM

## 2013-08-15 DIAGNOSIS — Z3480 Encounter for supervision of other normal pregnancy, unspecified trimester: Secondary | ICD-10-CM

## 2013-08-15 DIAGNOSIS — Z3689 Encounter for other specified antenatal screening: Secondary | ICD-10-CM | POA: Insufficient documentation

## 2013-08-15 DIAGNOSIS — O99210 Obesity complicating pregnancy, unspecified trimester: Principal | ICD-10-CM

## 2013-08-15 LAB — CBC
HEMATOCRIT: 29.5 % — AB (ref 36.0–46.0)
HEMOGLOBIN: 10.4 g/dL — AB (ref 12.0–15.0)
MCH: 29.5 pg (ref 26.0–34.0)
MCHC: 35.3 g/dL (ref 30.0–36.0)
MCV: 83.8 fL (ref 78.0–100.0)
Platelets: 220 10*3/uL (ref 150–400)
RBC: 3.52 MIL/uL — AB (ref 3.87–5.11)
RDW: 14.3 % (ref 11.5–15.5)
WBC: 9.1 10*3/uL (ref 4.0–10.5)

## 2013-08-15 LAB — RPR

## 2013-08-15 LAB — POCT URINALYSIS DIP (DEVICE)
Bilirubin Urine: NEGATIVE
Glucose, UA: NEGATIVE mg/dL
HGB URINE DIPSTICK: NEGATIVE
Ketones, ur: 15 mg/dL — AB
Nitrite: NEGATIVE
Protein, ur: NEGATIVE mg/dL
SPECIFIC GRAVITY, URINE: 1.015 (ref 1.005–1.030)
Urobilinogen, UA: 0.2 mg/dL (ref 0.0–1.0)
pH: 7 (ref 5.0–8.0)

## 2013-08-15 LAB — HIV ANTIBODY (ROUTINE TESTING W REFLEX): HIV 1&2 Ab, 4th Generation: NONREACTIVE

## 2013-08-15 NOTE — Progress Notes (Signed)
P= 76 1hr GTT and 28 weeks labs today at 1131.

## 2013-08-15 NOTE — Progress Notes (Signed)
Just had US>result pending. If normal and BPs normal will repeat US at 34 weeks or sooner if MFM recommends. S>D d/t body habitus. Has job interview today. Doing well.

## 2013-08-15 NOTE — Patient Instructions (Signed)
Third Trimester of Pregnancy The third trimester is from week 29 through week 42, months 7 through 9. The third trimester is a time when the fetus is growing rapidly. At the end of the ninth month, the fetus is about 20 inches in length and weighs 6 10 pounds.  BODY CHANGES Your body goes through many changes during pregnancy. The changes vary from woman to woman.   Your weight will continue to increase. You can expect to gain 25 35 pounds (11 16 kg) by the end of the pregnancy.  You may begin to get stretch marks on your hips, abdomen, and breasts.  You may urinate more often because the fetus is moving lower into your pelvis and pressing on your bladder.  You may develop or continue to have heartburn as a result of your pregnancy.  You may develop constipation because certain hormones are causing the muscles that push waste through your intestines to slow down.  You may develop hemorrhoids or swollen, bulging veins (varicose veins).  You may have pelvic pain because of the weight gain and pregnancy hormones relaxing your joints between the bones in your pelvis. Back aches may result from over exertion of the muscles supporting your posture.  Your breasts will continue to grow and be tender. A yellow discharge may leak from your breasts called colostrum.  Your belly button may stick out.  You may feel short of breath because of your expanding uterus.  You may notice the fetus "dropping," or moving lower in your abdomen.  You may have a bloody mucus discharge. This usually occurs a few days to a week before labor begins.  Your cervix becomes thin and soft (effaced) near your due date. WHAT TO EXPECT AT YOUR PRENATAL EXAMS  You will have prenatal exams every 2 weeks until week 36. Then, you will have weekly prenatal exams. During a routine prenatal visit:  You will be weighed to make sure you and the fetus are growing normally.  Your blood pressure is taken.  Your abdomen will be  measured to track your baby's growth.  The fetal heartbeat will be listened to.  Any test results from the previous visit will be discussed.  You may have a cervical check near your due date to see if you have effaced. At around 36 weeks, your caregiver will check your cervix. At the same time, your caregiver will also perform a test on the secretions of the vaginal tissue. This test is to determine if a type of bacteria, Group B streptococcus, is present. Your caregiver will explain this further. Your caregiver may ask you:  What your birth plan is.  How you are feeling.  If you are feeling the baby move.  If you have had any abnormal symptoms, such as leaking fluid, bleeding, severe headaches, or abdominal cramping.  If you have any questions. Other tests or screenings that may be performed during your third trimester include:  Blood tests that check for low iron levels (anemia).  Fetal testing to check the health, activity level, and growth of the fetus. Testing is done if you have certain medical conditions or if there are problems during the pregnancy. FALSE LABOR You may feel small, irregular contractions that eventually go away. These are called Braxton Hicks contractions, or false labor. Contractions may last for hours, days, or even weeks before true labor sets in. If contractions come at regular intervals, intensify, or become painful, it is best to be seen by your caregiver.  SIGNS OF LABOR   Menstrual-like cramps.  Contractions that are 5 minutes apart or less.  Contractions that start on the top of the uterus and spread down to the lower abdomen and back.  A sense of increased pelvic pressure or back pain.  A watery or bloody mucus discharge that comes from the vagina. If you have any of these signs before the 37th week of pregnancy, call your caregiver right away. You need to go to the hospital to get checked immediately. HOME CARE INSTRUCTIONS   Avoid all  smoking, herbs, alcohol, and unprescribed drugs. These chemicals affect the formation and growth of the baby.  Follow your caregiver's instructions regarding medicine use. There are medicines that are either safe or unsafe to take during pregnancy.  Exercise only as directed by your caregiver. Experiencing uterine cramps is a good sign to stop exercising.  Continue to eat regular, healthy meals.  Wear a good support bra for breast tenderness.  Do not use hot tubs, steam rooms, or saunas.  Wear your seat belt at all times when driving.  Avoid raw meat, uncooked cheese, cat litter boxes, and soil used by cats. These carry germs that can cause birth defects in the baby.  Take your prenatal vitamins.  Try taking a stool softener (if your caregiver approves) if you develop constipation. Eat more high-fiber foods, such as fresh vegetables or fruit and whole grains. Drink plenty of fluids to keep your urine clear or pale yellow.  Take warm sitz baths to soothe any pain or discomfort caused by hemorrhoids. Use hemorrhoid cream if your caregiver approves.  If you develop varicose veins, wear support hose. Elevate your feet for 15 minutes, 3 4 times a day. Limit salt in your diet.  Avoid heavy lifting, wear low heal shoes, and practice good posture.  Rest a lot with your legs elevated if you have leg cramps or low back pain.  Visit your dentist if you have not gone during your pregnancy. Use a soft toothbrush to brush your teeth and be gentle when you floss.  A sexual relationship may be continued unless your caregiver directs you otherwise.  Do not travel far distances unless it is absolutely necessary and only with the approval of your caregiver.  Take prenatal classes to understand, practice, and ask questions about the labor and delivery.  Make a trial run to the hospital.  Pack your hospital bag.  Prepare the baby's nursery.  Continue to go to all your prenatal visits as directed  by your caregiver. SEEK MEDICAL CARE IF:  You are unsure if you are in labor or if your water has broken.  You have dizziness.  You have mild pelvic cramps, pelvic pressure, or nagging pain in your abdominal area.  You have persistent nausea, vomiting, or diarrhea.  You have a bad smelling vaginal discharge.  You have pain with urination. SEEK IMMEDIATE MEDICAL CARE IF:   You have a fever.  You are leaking fluid from your vagina.  You have spotting or bleeding from your vagina.  You have severe abdominal cramping or pain.  You have rapid weight loss or gain.  You have shortness of breath with chest pain.  You notice sudden or extreme swelling of your face, hands, ankles, feet, or legs.  You have not felt your baby move in over an hour.  You have severe headaches that do not go away with medicine.  You have vision changes. Document Released: 04/11/2001 Document Revised: 12/18/2012 Document Reviewed:   You have severe abdominal cramping or pain.   You have rapid weight loss or gain.   You have shortness of breath with chest pain.   You notice sudden or extreme swelling of your face, hands, ankles, feet, or legs.   You have not felt your baby move in over an hour.   You have severe headaches that do not go away with medicine.   You have vision changes.  Document Released: 04/11/2001 Document Revised: 12/18/2012 Document Reviewed: 06/18/2012  [employer] Patient Information 2014 [employer].

## 2013-08-16 LAB — GLUCOSE TOLERANCE, 1 HOUR (50G) W/O FASTING: GLUCOSE 1 HOUR GTT: 76 mg/dL (ref 70–140)

## 2013-09-12 ENCOUNTER — Ambulatory Visit (INDEPENDENT_AMBULATORY_CARE_PROVIDER_SITE_OTHER): Payer: BC Managed Care – PPO | Admitting: Obstetrics and Gynecology

## 2013-09-12 ENCOUNTER — Encounter: Payer: Self-pay | Admitting: Obstetrics and Gynecology

## 2013-09-12 VITALS — BP 137/90 | Temp 97.5°F | Wt 358.7 lb

## 2013-09-12 DIAGNOSIS — O169 Unspecified maternal hypertension, unspecified trimester: Secondary | ICD-10-CM

## 2013-09-12 DIAGNOSIS — IMO0002 Reserved for concepts with insufficient information to code with codable children: Secondary | ICD-10-CM

## 2013-09-12 LAB — POCT URINALYSIS DIP (DEVICE)
GLUCOSE, UA: NEGATIVE mg/dL
Nitrite: NEGATIVE
Protein, ur: 30 mg/dL — AB
Specific Gravity, Urine: >=1.03 (ref 1.005–1.030)
UROBILINOGEN UA: 1 mg/dL (ref 0.0–1.0)
pH: 6 (ref 5.0–8.0)

## 2013-09-12 NOTE — Progress Notes (Signed)
[place] from 28wks reviewed: AFI 9.08 and <4th %ile, 75th%ile growth, rec F/U 6-8 wks. Pt states again no hx CHTN, only GHTN/preE> essential HTN removed from PL. Good FM. No H/A visual disturbance. Has decreased her smoking. BP recheck: 124/84. F/U US 1 wk.

## 2013-09-12 NOTE — Progress Notes (Signed)
C/o of back strain and whole left side of body swollen due to heavy lifting at work; requests note to be put on light duty.

## 2013-09-12 NOTE — Patient Instructions (Signed)
Smoking Cessation Quitting smoking is important to your health and has many advantages. However, it is not always easy to quit since nicotine is a very addictive drug. Often times, people try 3 times or more before being able to quit. This document explains the best ways for you to prepare to quit smoking. Quitting takes hard work and a lot of effort, but you can do it. ADVANTAGES OF QUITTING SMOKING  You will live longer, feel better, and live better.  Your body will feel the impact of quitting smoking almost immediately.  Within 20 minutes, blood pressure decreases. Your pulse returns to its normal level.  After 8 hours, carbon monoxide levels in the blood return to normal. Your oxygen level increases.  After 24 hours, the chance of having a heart attack starts to decrease. Your breath, hair, and body stop smelling like smoke.  After 48 hours, damaged nerve endings begin to recover. Your sense of taste and smell improve.  After 72 hours, the body is virtually free of nicotine. Your bronchial tubes relax and breathing becomes easier.  After 2 to 12 weeks, lungs can hold more air. Exercise becomes easier and circulation improves.  The risk of having a heart attack, stroke, cancer, or lung disease is greatly reduced.  After 1 year, the risk of coronary heart disease is cut in half.  After 5 years, the risk of stroke falls to the same as a nonsmoker.  After 10 years, the risk of lung cancer is cut in half and the risk of other cancers decreases significantly.  After 15 years, the risk of coronary heart disease drops, usually to the level of a nonsmoker.  If you are pregnant, quitting smoking will improve your chances of having a healthy baby.  The people you live with, especially any children, will be healthier.  You will have extra money to spend on things other than cigarettes. QUESTIONS TO THINK ABOUT BEFORE ATTEMPTING TO QUIT You may want to talk about your answers with your  caregiver.  Why do you want to quit?  If you tried to quit in the past, what helped and what did not?  What will be the most difficult situations for you after you quit? How will you plan to handle them?  Who can help you through the tough times? Your family? Friends? A caregiver?  What pleasures do you get from smoking? What ways can you still get pleasure if you quit? Here are some questions to ask your caregiver:  How can you help me to be successful at quitting?  What medicine do you think would be best for me and how should I take it?  What should I do if I need more help?  What is smoking withdrawal like? How can I get information on withdrawal? GET READY  Set a quit date.  Change your environment by getting rid of all cigarettes, ashtrays, matches, and lighters in your home, car, or work. Do not let people smoke in your home.  Review your past attempts to quit. Think about what worked and what did not. GET SUPPORT AND ENCOURAGEMENT You have a better chance of being successful if you have help. You can get support in many ways.  Tell your family, friends, and co-workers that you are going to quit and need their support. Ask them not to smoke around you.  Get individual, group, or telephone counseling and support. Programs are available at local hospitals and health centers. Call your local health department for   information about programs in your area.  Spiritual beliefs and practices may help some smokers quit.  Download a "quit meter" on your computer to keep track of quit statistics, such as how long you have gone without smoking, cigarettes not smoked, and money saved.  Get a self-help book about quitting smoking and staying off of tobacco. LEARN NEW SKILLS AND BEHAVIORS  Distract yourself from urges to smoke. Talk to someone, go for a walk, or occupy your time with a task.  Change your normal routine. Take a different route to work. Drink tea instead of coffee.  Eat breakfast in a different place.  Reduce your stress. Take a hot bath, exercise, or read a book.  Plan something enjoyable to do every day. Reward yourself for not smoking.  Explore interactive web-based programs that specialize in helping you quit. GET MEDICINE AND USE IT CORRECTLY Medicines can help you stop smoking and decrease the urge to smoke. Combining medicine with the above behavioral methods and support can greatly increase your chances of successfully quitting smoking.  Nicotine replacement therapy helps deliver nicotine to your body without the negative effects and risks of smoking. Nicotine replacement therapy includes nicotine gum, lozenges, inhalers, nasal sprays, and skin patches. Some may be available over-the-counter and others require a prescription.  Antidepressant medicine helps people abstain from smoking, but how this works is unknown. This medicine is available by prescription.  Nicotinic receptor partial agonist medicine simulates the effect of nicotine in your brain. This medicine is available by prescription. Ask your caregiver for advice about which medicines to use and how to use them based on your health history. Your caregiver will tell you what side effects to look out for if you choose to be on a medicine or therapy. Carefully read the information on the package. Do not use any other product containing nicotine while using a nicotine replacement product.  RELAPSE OR DIFFICULT SITUATIONS Most relapses occur within the first 3 months after quitting. Do not be discouraged if you start smoking again. Remember, most people try several times before finally quitting. You may have symptoms of withdrawal because your body is used to nicotine. You may crave cigarettes, be irritable, feel very hungry, cough often, get headaches, or have difficulty concentrating. The withdrawal symptoms are only temporary. They are strongest when you first quit, but they will go away within  10 14 days. To reduce the chances of relapse, try to:  Avoid drinking alcohol. Drinking lowers your chances of successfully quitting.  Reduce the amount of caffeine you consume. Once you quit smoking, the amount of caffeine in your body increases and can give you symptoms, such as a rapid heartbeat, sweating, and anxiety.  Avoid smokers because they can make you want to smoke.  Do not let weight gain distract you. Many smokers will gain weight when they quit, usually less than 10 pounds. Eat a healthy diet and stay active. You can always lose the weight gained after you quit.  Find ways to improve your mood other than smoking. FOR MORE INFORMATION  www.smokefree.gov  Document Released: 04/11/2001 Document Revised: 10/17/2011 Document Reviewed: 07/27/2011 [employer] Patient Information 2014 [employer].  

## 2013-09-17 ENCOUNTER — Ambulatory Visit (HOSPITAL_COMMUNITY): Payer: MEDICAID

## 2013-09-17 ENCOUNTER — Ambulatory Visit (HOSPITAL_COMMUNITY)
Admission: RE | Admit: 2013-09-17 | Discharge: 2013-09-17 | Disposition: A | Discharge status: Home / Self Care | Payer: BC Managed Care – PPO | Source: Ambulatory Visit | Attending: Obstetrics and Gynecology | Admitting: Obstetrics and Gynecology

## 2013-09-17 DIAGNOSIS — Z3689 Encounter for other specified antenatal screening: Secondary | ICD-10-CM | POA: Insufficient documentation

## 2013-09-17 DIAGNOSIS — E669 Obesity, unspecified: Secondary | ICD-10-CM | POA: Insufficient documentation

## 2013-09-17 DIAGNOSIS — O99210 Obesity complicating pregnancy, unspecified trimester: Secondary | ICD-10-CM

## 2013-09-17 DIAGNOSIS — O169 Unspecified maternal hypertension, unspecified trimester: Secondary | ICD-10-CM

## 2013-09-17 DIAGNOSIS — O10019 Pre-existing essential hypertension complicating pregnancy, unspecified trimester: Secondary | ICD-10-CM | POA: Insufficient documentation

## 2013-09-25 ENCOUNTER — Ambulatory Visit (INDEPENDENT_AMBULATORY_CARE_PROVIDER_SITE_OTHER): Payer: BC Managed Care – PPO | Admitting: Obstetrics & Gynecology

## 2013-09-25 ENCOUNTER — Encounter: Payer: Self-pay | Admitting: Obstetrics & Gynecology

## 2013-09-25 VITALS — BP 123/71 | HR 55 | Temp 97.2°F | Wt 351.7 lb

## 2013-09-25 DIAGNOSIS — E669 Obesity, unspecified: Secondary | ICD-10-CM

## 2013-09-25 DIAGNOSIS — Z8742 Personal history of other diseases of the female genital tract: Secondary | ICD-10-CM

## 2013-09-25 DIAGNOSIS — O99210 Obesity complicating pregnancy, unspecified trimester: Secondary | ICD-10-CM

## 2013-09-25 DIAGNOSIS — Z3480 Encounter for supervision of other normal pregnancy, unspecified trimester: Secondary | ICD-10-CM

## 2013-09-25 DIAGNOSIS — R82998 Other abnormal findings in urine: Secondary | ICD-10-CM

## 2013-09-25 DIAGNOSIS — R8290 Unspecified abnormal findings in urine: Secondary | ICD-10-CM

## 2013-09-25 DIAGNOSIS — IMO0002 Reserved for concepts with insufficient information to code with codable children: Secondary | ICD-10-CM

## 2013-09-25 DIAGNOSIS — Z8759 Personal history of other complications of pregnancy, childbirth and the puerperium: Secondary | ICD-10-CM

## 2013-09-25 DIAGNOSIS — Z23 Encounter for immunization: Secondary | ICD-10-CM

## 2013-09-25 DIAGNOSIS — O99332 Smoking (tobacco) complicating pregnancy, second trimester: Secondary | ICD-10-CM

## 2013-09-25 DIAGNOSIS — O99330 Smoking (tobacco) complicating pregnancy, unspecified trimester: Secondary | ICD-10-CM

## 2013-09-25 LAB — POCT URINALYSIS DIP (DEVICE)
GLUCOSE, UA: NEGATIVE mg/dL
Hgb urine dipstick: NEGATIVE
Ketones, ur: NEGATIVE mg/dL
NITRITE: NEGATIVE
Protein, ur: 30 mg/dL — AB
Specific Gravity, Urine: 1.02 (ref 1.005–1.030)
Urobilinogen, UA: 1 mg/dL (ref 0.0–1.0)
pH: 7 (ref 5.0–8.0)

## 2013-09-25 MED ORDER — TETANUS-DIPHTH-ACELL PERTUSSIS 5-2.5-18.5 LF-MCG/0.5 IM SUSP: 0.5000 mL | Freq: Once | INTRAMUSCULAR | Status: DC

## 2013-09-25 NOTE — Progress Notes (Signed)
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  Tdap today.

## 2013-09-25 NOTE — Progress Notes (Signed)
Work restrictions letter given.  1+ protein and mod LE on UA, culture sent. No urinary symptoms. 5/20 EFW 5 lb /77%.  No other complaints or concerns.  Fetal movement and labor precautions reviewed.

## 2013-09-25 NOTE — Patient Instructions (Signed)
Return to clinic for any obstetric concerns or go to MAU for evaluation  

## 2013-09-27 LAB — CULTURE, OB URINE: Colony Count: >=100000

## 2013-09-29 ENCOUNTER — Encounter: Payer: Self-pay | Admitting: *Deleted

## 2013-09-29 ENCOUNTER — Telehealth: Payer: Self-pay | Admitting: *Deleted

## 2013-09-29 NOTE — Telephone Encounter (Signed)
Tabitha Holland left a message she was given a note for work restrictions for rest of pregnancy, but her job needs specific dates.  I called her and informed her I would correct letter- she will pick up in am.

## 2013-09-30 ENCOUNTER — Encounter: Payer: Self-pay | Admitting: Obstetrics & Gynecology

## 2013-10-09 ENCOUNTER — Ambulatory Visit (INDEPENDENT_AMBULATORY_CARE_PROVIDER_SITE_OTHER): Payer: BC Managed Care – PPO | Admitting: Obstetrics & Gynecology

## 2013-10-09 VITALS — BP 133/84 | HR 72 | Temp 97.8°F | Wt 353.7 lb

## 2013-10-09 DIAGNOSIS — Z3480 Encounter for supervision of other normal pregnancy, unspecified trimester: Secondary | ICD-10-CM

## 2013-10-09 LAB — POCT URINALYSIS DIP (DEVICE)
BILIRUBIN URINE: NEGATIVE
Glucose, UA: NEGATIVE mg/dL
Hgb urine dipstick: NEGATIVE
Ketones, ur: NEGATIVE mg/dL
NITRITE: NEGATIVE
PH: 7 (ref 5.0–8.0)
PROTEIN: NEGATIVE mg/dL
Specific Gravity, Urine: 1.015 (ref 1.005–1.030)
Urobilinogen, UA: 0.2 mg/dL (ref 0.0–1.0)

## 2013-10-09 LAB — OB RESULTS CONSOLE GBS: GBS: NEGATIVE

## 2013-10-09 NOTE — Progress Notes (Signed)
Reports pelvic pain, especially on right side-- reports when baby moves and presses down it is very painful (thinks a cyst may be present like in last pregnancy). Braxton hicks.  GBS and cultures today.  Educational packet with information for delivery/circumcision/postaprtum care given.

## 2013-10-09 NOTE — Patient Instructions (Signed)
Return to clinic for any obstetric concerns or go to MAU for evaluation  

## 2013-10-09 NOTE — Progress Notes (Signed)
Pelvic cultures done today.  No other complaints or concerns.  Fetal movement and labor precautions reviewed. May need repeat growth scan closer to 38 weeks, continue to follow.  09/17/13 scan was 5 lbs (77%).

## 2013-10-10 LAB — GC/CHLAMYDIA PROBE AMP
CT Probe RNA: NEGATIVE
GC PROBE AMP APTIMA: NEGATIVE

## 2013-10-13 LAB — CULTURE, BETA STREP (GROUP B ONLY)

## 2013-10-14 ENCOUNTER — Encounter: Payer: Self-pay | Admitting: Obstetrics & Gynecology

## 2013-10-15 ENCOUNTER — Encounter: Payer: Self-pay | Admitting: *Deleted

## 2013-10-27 ENCOUNTER — Inpatient Hospital Stay (HOSPITAL_COMMUNITY): Payer: BC Managed Care – PPO

## 2013-10-27 ENCOUNTER — Inpatient Hospital Stay (HOSPITAL_COMMUNITY)
Admission: AD | Admit: 2013-10-27 | Discharge: 2013-10-31 | DRG: 765 | Disposition: A | Discharge status: Home / Self Care | Payer: BC Managed Care – PPO | Source: Ambulatory Visit | Attending: Obstetrics & Gynecology | Admitting: Obstetrics & Gynecology

## 2013-10-27 ENCOUNTER — Encounter (HOSPITAL_COMMUNITY): Payer: Self-pay | Admitting: *Deleted

## 2013-10-27 DIAGNOSIS — Z6841 Body Mass Index (BMI) 40.0 and over, adult: Secondary | ICD-10-CM

## 2013-10-27 DIAGNOSIS — IMO0002 Reserved for concepts with insufficient information to code with codable children: Secondary | ICD-10-CM | POA: Diagnosis present

## 2013-10-27 DIAGNOSIS — O99334 Smoking (tobacco) complicating childbirth: Secondary | ICD-10-CM | POA: Diagnosis present

## 2013-10-27 DIAGNOSIS — Z8249 Family history of ischemic heart disease and other diseases of the circulatory system: Secondary | ICD-10-CM

## 2013-10-27 DIAGNOSIS — O1002 Pre-existing essential hypertension complicating childbirth: Secondary | ICD-10-CM | POA: Diagnosis present

## 2013-10-27 DIAGNOSIS — E669 Obesity, unspecified: Secondary | ICD-10-CM | POA: Diagnosis present

## 2013-10-27 DIAGNOSIS — Z98891 History of uterine scar from previous surgery: Secondary | ICD-10-CM

## 2013-10-27 DIAGNOSIS — Z8759 Personal history of other complications of pregnancy, childbirth and the puerperium: Secondary | ICD-10-CM

## 2013-10-27 DIAGNOSIS — O99214 Obesity complicating childbirth: Secondary | ICD-10-CM

## 2013-10-27 DIAGNOSIS — O99332 Smoking (tobacco) complicating pregnancy, second trimester: Secondary | ICD-10-CM

## 2013-10-27 LAB — URINALYSIS, ROUTINE W REFLEX MICROSCOPIC
BILIRUBIN URINE: NEGATIVE
GLUCOSE, UA: NEGATIVE mg/dL
Hgb urine dipstick: NEGATIVE
KETONES UR: NEGATIVE mg/dL
Leukocytes, UA: NEGATIVE
Nitrite: NEGATIVE
Protein, ur: NEGATIVE mg/dL
Specific Gravity, Urine: 1.01 (ref 1.005–1.030)
Urobilinogen, UA: 0.2 mg/dL (ref 0.0–1.0)
pH: 5.5 (ref 5.0–8.0)

## 2013-10-27 LAB — POCT PREGNANCY, URINE: Preg Test, Ur: POSITIVE — AB

## 2013-10-27 MED ORDER — DOCUSATE SODIUM 100 MG PO CAPS
100.0000 mg | ORAL_CAPSULE | Freq: Every day | ORAL | Status: DC
  Filled 2013-10-27: qty 1

## 2013-10-27 MED ORDER — CALCIUM CARBONATE ANTACID 500 MG PO CHEW
2.0000 | CHEWABLE_TABLET | ORAL | Status: DC | PRN
  Filled 2013-10-27: qty 2

## 2013-10-27 MED ORDER — LACTATED RINGERS IV SOLN
INTRAVENOUS | Status: DC
  Administered 2013-10-28 (×2): via INTRAVENOUS

## 2013-10-27 MED ORDER — ZOLPIDEM TARTRATE 5 MG PO TABS: 5.0000 mg | ORAL_TABLET | Freq: Every evening | ORAL | Status: DC | PRN

## 2013-10-27 MED ORDER — ACETAMINOPHEN 325 MG PO TABS
650.0000 mg | ORAL_TABLET | ORAL | Status: DC | PRN
  Administered 2013-10-28: 650 mg via ORAL
  Filled 2013-10-27: qty 2

## 2013-10-27 MED ORDER — PRENATAL MULTIVITAMIN CH
1.0000 | ORAL_TABLET | Freq: Every day | ORAL | Status: DC
  Filled 2013-10-27: qty 1

## 2013-10-27 NOTE — MAU Provider Note (Signed)
None     Chief Complaint:  Leg Swelling and Back Pain   NICHOL ATOR is  28 y.o. G3P2002 at [redacted]w[redacted]d presents complaining of Leg Swelling and Back Pain She states that she has been having swelling in her lower extremities that is equal, and resolves with elevation. Pt states that the right is more tender than the left. Pt also is having lower abdominal pressure that is worse when she stands. Pt denies contractions, states normal movement, no loss of fluid or blood. Pt has an appt on Thursday.   No other complaints at todays visit. No headaches, vision changes, no RUQ pain. No urinary or bowel symptoms.  Obstetrical/Gynecological History: OB History   Grav Para Term Preterm Abortions TAB SAB Ect Mult Living   3 2 2  0 0 0 0 0 0 2     Past Medical History: Past Medical History  Diagnosis Date  . Hypertension   . Morbid obesity     Past Surgical History: Past Surgical History  Procedure Laterality Date  . Cholecystectomy    . Wisdom tooth extraction      Family History: Family History  Problem Relation Age of Onset  . Hypertension Sister     Social History: History  Substance Use Topics  . Smoking status: Current Some Day Smoker -- 0.25 packs/day    Types: Cigarettes    Last Attempt to Quit: 08/08/2013  . Smokeless tobacco: Not on file  . Alcohol Use: Yes     Comment: ocassional    Allergies:  Allergies  Allergen Reactions  . Codeine Other (See Comments)    migraines  . Hydrocodone Other (See Comments)    migraines  . Percocet [Oxycodone-Acetaminophen] Other (See Comments)    migraines    Meds:  Facility-administered medications prior to admission  Medication Dose Route Frequency Tahje Borawski Last Rate Last Dose  . Tdap (BOOSTRIX) injection 0.5 mL  0.5 mL Intramuscular Once Osborne Oman, MD       Prescriptions prior to admission  Medication Sig Dispense Refill  . Prenatal Vit-Fe Fumarate-FA (PRENATAL MULTIVITAMIN) TABS tablet Take 1 tablet by mouth  every morning.        Review of Systems -   Review of Systems  See HPI   Physical Exam  Blood pressure 143/78, pulse 65, temperature 97.9 F (36.6 C), temperature source Oral, resp. rate 20, height 5\' 11"  (1.803 m), weight 165.223 kg (364 lb 4 oz), last menstrual period 02/01/2013. GENERAL: Well-developed, well-nourished female in no acute distress.  ABDOMEN: Soft, nontender, nondistended, gravid.  EXTREMITIES: Minimally tender RLQ, BIl 1+ edema, 50.5 and 50cm R/L 2cm below knee. 2+ distal pulses. DTR's 2+ Dilation: 1.5 Effacement (%): Thick Station: -3 Presentation: Undeterminable Exam by:: DCALLAWAY, RN  Presentation: cephalic FHT:  Baseline rate 130s bpm   Variability moderate  Accelerations multiple >15x15 accels   Decelerations 1 prolonged decel to 60s with slow recovery over 4 minutes Contractions: none   Labs: Results for orders placed during the hospital encounter of 10/27/13 (from the past 24 hour(s))  URINALYSIS, ROUTINE W REFLEX MICROSCOPIC   Collection Time    10/27/13  7:30 PM      Result Value Ref Range   Color, Urine YELLOW  YELLOW   APPearance CLEAR  CLEAR   Specific Gravity, Urine 1.010  1.005 - 1.030   pH 5.5  5.0 - 8.0   Glucose, UA NEGATIVE  NEGATIVE mg/dL   Hgb urine dipstick NEGATIVE  NEGATIVE   Bilirubin Urine  NEGATIVE  NEGATIVE   Ketones, ur NEGATIVE  NEGATIVE mg/dL   Protein, ur NEGATIVE  NEGATIVE mg/dL   Urobilinogen, UA 0.2  0.0 - 1.0 mg/dL   Nitrite NEGATIVE  NEGATIVE   Leukocytes, UA NEGATIVE  NEGATIVE  POCT PREGNANCY, URINE   Collection Time    10/27/13  7:42 PM      Result Value Ref Range   Preg Test, Ur POSITIVE (*) NEGATIVE   Imaging Studies:  No results found.  Assessment: SELEENA REIMERS is  28 y.o. G3P2002 at [redacted]w[redacted]d presents with swelling and lower abdominal pressure.  #Swelling: normotensive, neg protein in urine, Neg PreX symptoms. Resolves spontaneously, equal bilateral, low concern for DVT, Likely normal swelling of  pregnancy  #Abdominal Pressure: no contractions, no UTI on UA, likely pressure related to late term pregnancy  #Reoccurring Spontaneous Decel: BPP shows 8/10.  Discussed with Dr. Roselie Awkward. Will admit for observation overnight and consider IOL vs discharge after prolonged monitoring.   Fredrik Rigger 6/29/20159:16 PM

## 2013-10-27 NOTE — MAU Note (Signed)
C/o lower abdominal pressure for past 3 days; c/o swelling in lower legs and feet for past 2 weeks; c/o back pain for past 2 days;

## 2013-10-27 NOTE — H&P (Signed)
None     Chief Complaint:  Leg Swelling and Back Pain   JOZETTE CASTRELLON is  28 y.o. G3P2002 at [redacted]w[redacted]d presents complaining of Leg Swelling and Back Pain She states that she has been having swelling in her lower extremities that is equal, and resolves with elevation. Pt states that the right is more tender than the left. Pt also is having lower abdominal pressure that is worse when she stands. Pt denies contractions, states normal movement, no loss of fluid or blood. Pt has an appt on Thursday.   No other complaints at todays visit. No headaches, vision changes, no RUQ pain. No urinary or bowel symptoms.  Obstetrical/Gynecological History: OB History   Grav Para Term Preterm Abortions TAB SAB Ect Mult Living   3 2 2  0 0 0 0 0 0 2     Past Medical History: Past Medical History  Diagnosis Date  . Hypertension   . Morbid obesity     Past Surgical History: Past Surgical History  Procedure Laterality Date  . Cholecystectomy    . Wisdom tooth extraction      Family History: Family History  Problem Relation Age of Onset  . Hypertension Sister     Social History: History  Substance Use Topics  . Smoking status: Current Some Day Smoker -- 0.25 packs/day    Types: Cigarettes    Last Attempt to Quit: 08/08/2013  . Smokeless tobacco: Not on file  . Alcohol Use: Yes     Comment: ocassional    Allergies:  Allergies  Allergen Reactions  . Codeine Other (See Comments)    migraines  . Hydrocodone Other (See Comments)    migraines  . Percocet [Oxycodone-Acetaminophen] Other (See Comments)    migraines    Meds:  Facility-administered medications prior to admission  Medication Dose Route Frequency Provider Last Rate Last Dose  . Tdap (BOOSTRIX) injection 0.5 mL  0.5 mL Intramuscular Once Osborne Oman, MD       Prescriptions prior to admission  Medication Sig Dispense Refill  . Prenatal Vit-Fe Fumarate-FA (PRENATAL MULTIVITAMIN) TABS tablet Take 1 tablet by mouth  every morning.        Review of Systems -   Review of Systems  See HPI   Physical Exam  Blood pressure 143/78, pulse 65, temperature 97.9 F (36.6 C), temperature source Oral, resp. rate 20, height 5\' 11"  (1.803 m), weight 165.223 kg (364 lb 4 oz), last menstrual period 02/01/2013. GENERAL: Well-developed, well-nourished female in no acute distress.  ABDOMEN: Soft, nontender, nondistended, gravid.  EXTREMITIES: Minimally tender RLQ, BIl 1+ edema, 50.5 and 50cm R/L 2cm below knee. 2+ distal pulses. DTR's 2+ Dilation: 1.5 Effacement (%): Thick Station: -3 Presentation: Undeterminable Exam by:: DCALLAWAY, RN  Presentation: cephalic FHT:  Baseline rate 130s bpm   Variability moderate  Accelerations multiple >15x15 accels   Decelerations 1 prolonged decel to 60s with slow recovery over 4 minutes Contractions: none   Labs: Results for orders placed during the hospital encounter of 10/27/13 (from the past 24 hour(s))  URINALYSIS, ROUTINE W REFLEX MICROSCOPIC   Collection Time    10/27/13  7:30 PM      Result Value Ref Range   Color, Urine YELLOW  YELLOW   APPearance CLEAR  CLEAR   Specific Gravity, Urine 1.010  1.005 - 1.030   pH 5.5  5.0 - 8.0   Glucose, UA NEGATIVE  NEGATIVE mg/dL   Hgb urine dipstick NEGATIVE  NEGATIVE   Bilirubin Urine  NEGATIVE  NEGATIVE   Ketones, ur NEGATIVE  NEGATIVE mg/dL   Protein, ur NEGATIVE  NEGATIVE mg/dL   Urobilinogen, UA 0.2  0.0 - 1.0 mg/dL   Nitrite NEGATIVE  NEGATIVE   Leukocytes, UA NEGATIVE  NEGATIVE  POCT PREGNANCY, URINE   Collection Time    10/27/13  7:42 PM      Result Value Ref Range   Preg Test, Ur POSITIVE (*) NEGATIVE   Imaging Studies:  No results found.  Assessment: DEAIRA LECKEY is  28 y.o. G3P2002 at [redacted]w[redacted]d presents with swelling and lower abdominal pressure.  #Swelling: normotensive, neg protein in urine, Neg PreX symptoms. Resolves spontaneously, equal bilateral, low concern for DVT, Likely normal swelling of  pregnancy  #Abdominal Pressure: no contractions, no UTI on UA, likely pressure related to late term pregnancy  #Reoccurring Spontaneous Decel: BPP shows 8/10.  Discussed with Dr. Roselie Awkward. Will admit for observation overnight and consider IOL vs discharge after prolonged monitoring.   Fredrik Rigger 6/29/20159:16 PM

## 2013-10-27 NOTE — MAU Note (Signed)
PT  SAYS SHE HAS SWELLING IN FEET AND LEGS-  NOTICED MORE  SINCE LAST MON-  GOES DOWN SOME AT NIGHT   SHE TOLD CLINIC AT HER APPOINTMENT  2 WEEKS AGO-   HER NEXT APPOINTMENT - IS Thursday-  .  SHE FEELS LOWER ABD PRESSURE.    DENIES HSV AND MRSA.Marland Kitchen    VE- 1 CM.  GBS- NEG

## 2013-10-28 ENCOUNTER — Encounter (HOSPITAL_COMMUNITY)
Admission: AD | Disposition: A | Discharge status: Home / Self Care | Payer: Self-pay | Source: Ambulatory Visit | Attending: Obstetrics & Gynecology

## 2013-10-28 ENCOUNTER — Encounter (HOSPITAL_COMMUNITY): Payer: BC Managed Care – PPO | Admitting: Anesthesiology

## 2013-10-28 ENCOUNTER — Inpatient Hospital Stay (HOSPITAL_COMMUNITY): Payer: BC Managed Care – PPO | Admitting: Anesthesiology

## 2013-10-28 ENCOUNTER — Encounter (HOSPITAL_COMMUNITY): Payer: Self-pay

## 2013-10-28 DIAGNOSIS — Z6841 Body Mass Index (BMI) 40.0 and over, adult: Secondary | ICD-10-CM

## 2013-10-28 DIAGNOSIS — Z98891 History of uterine scar from previous surgery: Secondary | ICD-10-CM

## 2013-10-28 DIAGNOSIS — O1002 Pre-existing essential hypertension complicating childbirth: Secondary | ICD-10-CM

## 2013-10-28 DIAGNOSIS — O99214 Obesity complicating childbirth: Secondary | ICD-10-CM

## 2013-10-28 DIAGNOSIS — E669 Obesity, unspecified: Secondary | ICD-10-CM

## 2013-10-28 LAB — COMPREHENSIVE METABOLIC PANEL
ALT: 10 U/L (ref 0–35)
AST: 13 U/L (ref 0–37)
Albumin: 2.3 g/dL — ABNORMAL LOW (ref 3.5–5.2)
Alkaline Phosphatase: 144 U/L — ABNORMAL HIGH (ref 39–117)
BILIRUBIN TOTAL: 0.3 mg/dL (ref 0.3–1.2)
BUN: 5 mg/dL — ABNORMAL LOW (ref 6–23)
CHLORIDE: 102 meq/L (ref 96–112)
CO2: 22 meq/L (ref 19–32)
CREATININE: 0.53 mg/dL (ref 0.50–1.10)
Calcium: 8.6 mg/dL (ref 8.4–10.5)
GLUCOSE: 75 mg/dL (ref 70–99)
Potassium: 3.5 mEq/L — ABNORMAL LOW (ref 3.7–5.3)
Sodium: 137 mEq/L (ref 137–147)
Total Protein: 5.7 g/dL — ABNORMAL LOW (ref 6.0–8.3)

## 2013-10-28 LAB — TYPE AND SCREEN
ABO/RH(D): O POS
Antibody Screen: NEGATIVE

## 2013-10-28 LAB — CBC
HCT: 29.9 % — ABNORMAL LOW (ref 36.0–46.0)
HEMATOCRIT: 29.3 % — AB (ref 36.0–46.0)
HEMOGLOBIN: 10 g/dL — AB (ref 12.0–15.0)
Hemoglobin: 9.7 g/dL — ABNORMAL LOW (ref 12.0–15.0)
MCH: 27.9 pg (ref 26.0–34.0)
MCH: 28.2 pg (ref 26.0–34.0)
MCHC: 33.1 g/dL (ref 30.0–36.0)
MCHC: 33.4 g/dL (ref 30.0–36.0)
MCV: 84.2 fL (ref 78.0–100.0)
MCV: 84.5 fL (ref 78.0–100.0)
PLATELETS: 181 10*3/uL (ref 150–400)
PLATELETS: 175 10*3/uL (ref 150–400)
RBC: 3.48 MIL/uL — ABNORMAL LOW (ref 3.87–5.11)
RBC: 3.54 MIL/uL — AB (ref 3.87–5.11)
RDW: 13.8 % (ref 11.5–15.5)
RDW: 13.7 % (ref 11.5–15.5)
WBC: 9.7 10*3/uL (ref 4.0–10.5)
WBC: 9.8 10*3/uL (ref 4.0–10.5)

## 2013-10-28 LAB — PROTEIN / CREATININE RATIO, URINE
CREATININE, URINE: 91.92 mg/dL
Protein Creatinine Ratio: 0.1 (ref 0.00–0.15)
Total Protein, Urine: 8.8 mg/dL

## 2013-10-28 SURGERY — Surgical Case
Anesthesia: Epidural

## 2013-10-28 MED ORDER — KETOROLAC TROMETHAMINE 30 MG/ML IJ SOLN: 30.0000 mg | Freq: Four times a day (QID) | INTRAMUSCULAR | Status: AC | PRN

## 2013-10-28 MED ORDER — DIPHENHYDRAMINE HCL 25 MG PO CAPS
25.0000 mg | ORAL_CAPSULE | ORAL | Status: DC | PRN
  Administered 2013-10-28 – 2013-10-29 (×2): 25 mg via ORAL
  Filled 2013-10-28 (×3): qty 1

## 2013-10-28 MED ORDER — ONDANSETRON HCL 4 MG PO TABS: 4.0000 mg | ORAL_TABLET | ORAL | Status: DC | PRN

## 2013-10-28 MED ORDER — MEPERIDINE HCL 25 MG/ML IJ SOLN: 6.2500 mg | INTRAMUSCULAR | Status: DC | PRN

## 2013-10-28 MED ORDER — PHENYLEPHRINE 8 MG IN D5W 100 ML (0.08MG/ML) PREMIX OPTIME
INJECTION | INTRAVENOUS | Status: DC | PRN
  Administered 2013-10-28: 60 ug/min via INTRAVENOUS

## 2013-10-28 MED ORDER — METOCLOPRAMIDE HCL 5 MG/ML IJ SOLN
INTRAMUSCULAR | Status: AC
  Filled 2013-10-28: qty 2

## 2013-10-28 MED ORDER — FENTANYL CITRATE 0.05 MG/ML IJ SOLN
INTRAMUSCULAR | Status: AC
  Filled 2013-10-28: qty 2

## 2013-10-28 MED ORDER — FENTANYL CITRATE 0.05 MG/ML IJ SOLN
INTRAMUSCULAR | Status: AC
  Administered 2013-10-28: 30 ug via INTRAVENOUS
  Filled 2013-10-28: qty 2

## 2013-10-28 MED ORDER — WITCH HAZEL-GLYCERIN EX PADS: 1.0000 "application " | MEDICATED_PAD | CUTANEOUS | Status: DC | PRN

## 2013-10-28 MED ORDER — IBUPROFEN 600 MG PO TABS
600.0000 mg | ORAL_TABLET | Freq: Four times a day (QID) | ORAL | Status: DC | PRN
  Administered 2013-10-31: 600 mg via ORAL
  Filled 2013-10-28: qty 1

## 2013-10-28 MED ORDER — MISOPROSTOL 25 MCG QUARTER TABLET
25.0000 ug | ORAL_TABLET | ORAL | Status: DC | PRN
  Administered 2013-10-28: 25 ug via VAGINAL
  Filled 2013-10-28: qty 0.25

## 2013-10-28 MED ORDER — LACTATED RINGERS IV SOLN
INTRAVENOUS | Status: DC
  Administered 2013-10-28: 23:00:00 via INTRAVENOUS

## 2013-10-28 MED ORDER — SENNOSIDES-DOCUSATE SODIUM 8.6-50 MG PO TABS
2.0000 | ORAL_TABLET | ORAL | Status: DC
  Administered 2013-10-29 – 2013-10-30 (×3): 2 via ORAL
  Filled 2013-10-28 (×3): qty 2

## 2013-10-28 MED ORDER — DEXTROSE 5 % IV SOLN
3.0000 g | INTRAVENOUS | Status: DC | PRN
  Administered 2013-10-28: 3 g via INTRAVENOUS

## 2013-10-28 MED ORDER — LANOLIN HYDROUS EX OINT: 1.0000 "application " | TOPICAL_OINTMENT | CUTANEOUS | Status: DC | PRN

## 2013-10-28 MED ORDER — PRENATAL MULTIVITAMIN CH
1.0000 | ORAL_TABLET | Freq: Every day | ORAL | Status: DC
  Administered 2013-10-29 – 2013-10-31 (×3): 1 via ORAL
  Filled 2013-10-28 (×3): qty 1

## 2013-10-28 MED ORDER — ONDANSETRON HCL 4 MG/2ML IJ SOLN: 4.0000 mg | Freq: Four times a day (QID) | INTRAMUSCULAR | Status: DC | PRN

## 2013-10-28 MED ORDER — DEXTROSE 5 % IV SOLN
1.0000 ug/kg/h | INTRAVENOUS | Status: DC | PRN
Start: 2013-10-28 — End: 2013-10-31
  Filled 2013-10-28: qty 2

## 2013-10-28 MED ORDER — ZOLPIDEM TARTRATE 5 MG PO TABS: 5.0000 mg | ORAL_TABLET | Freq: Every evening | ORAL | Status: DC | PRN

## 2013-10-28 MED ORDER — BUPIVACAINE IN DEXTROSE 0.75-8.25 % IT SOLN
INTRATHECAL | Status: DC | PRN
  Administered 2013-10-28: 1.6 mL via INTRATHECAL

## 2013-10-28 MED ORDER — MAGNESIUM HYDROXIDE 400 MG/5ML PO SUSP
30.0000 mL | ORAL | Status: DC | PRN
  Administered 2013-10-31: 30 mL via ORAL
  Filled 2013-10-28: qty 30

## 2013-10-28 MED ORDER — MORPHINE SULFATE (PF) 0.5 MG/ML IJ SOLN
INTRAMUSCULAR | Status: DC | PRN
  Administered 2013-10-28: .15 mg via INTRATHECAL

## 2013-10-28 MED ORDER — NALBUPHINE HCL 10 MG/ML IJ SOLN
INTRAMUSCULAR | Status: AC
  Administered 2013-10-28: 10 mg via SUBCUTANEOUS
  Filled 2013-10-28: qty 1

## 2013-10-28 MED ORDER — PHENYLEPHRINE HCL 10 MG/ML IJ SOLN
INTRAMUSCULAR | Status: AC
  Filled 2013-10-28: qty 1

## 2013-10-28 MED ORDER — NALOXONE HCL 0.4 MG/ML IJ SOLN: 0.4000 mg | INTRAMUSCULAR | Status: DC | PRN

## 2013-10-28 MED ORDER — MIDAZOLAM HCL 2 MG/2ML IJ SOLN: 0.5000 mg | Freq: Once | INTRAMUSCULAR | Status: DC | PRN

## 2013-10-28 MED ORDER — OXYTOCIN 40 UNITS IN LACTATED RINGERS INFUSION - SIMPLE MED
INTRAVENOUS | Status: DC | PRN
  Administered 2013-10-28: 40 [IU] via INTRAVENOUS

## 2013-10-28 MED ORDER — SCOPOLAMINE 1 MG/3DAYS TD PT72
MEDICATED_PATCH | TRANSDERMAL | Status: AC
  Administered 2013-10-28: 1.5 mg via TRANSDERMAL
  Filled 2013-10-28: qty 1

## 2013-10-28 MED ORDER — SIMETHICONE 80 MG PO CHEW
80.0000 mg | CHEWABLE_TABLET | ORAL | Status: DC
  Administered 2013-10-29 – 2013-10-30 (×3): 80 mg via ORAL
  Filled 2013-10-28 (×3): qty 1

## 2013-10-28 MED ORDER — SIMETHICONE 80 MG PO CHEW
80.0000 mg | CHEWABLE_TABLET | Freq: Three times a day (TID) | ORAL | Status: DC
  Administered 2013-10-28 – 2013-10-31 (×7): 80 mg via ORAL
  Filled 2013-10-28 (×5): qty 1

## 2013-10-28 MED ORDER — LACTATED RINGERS IV SOLN: INTRAVENOUS | Status: DC

## 2013-10-28 MED ORDER — DIPHENHYDRAMINE HCL 25 MG PO CAPS: 25.0000 mg | ORAL_CAPSULE | Freq: Four times a day (QID) | ORAL | Status: DC | PRN

## 2013-10-28 MED ORDER — MENTHOL 3 MG MT LOZG: 1.0000 | LOZENGE | OROMUCOSAL | Status: DC | PRN

## 2013-10-28 MED ORDER — NALBUPHINE HCL 10 MG/ML IJ SOLN
5.0000 mg | INTRAMUSCULAR | Status: DC | PRN
  Administered 2013-10-28: 10 mg via SUBCUTANEOUS
  Filled 2013-10-28: qty 1

## 2013-10-28 MED ORDER — ONDANSETRON HCL 4 MG/2ML IJ SOLN: 4.0000 mg | INTRAMUSCULAR | Status: DC | PRN

## 2013-10-28 MED ORDER — DIBUCAINE 1 % RE OINT: 1.0000 "application " | TOPICAL_OINTMENT | RECTAL | Status: DC | PRN

## 2013-10-28 MED ORDER — SODIUM CHLORIDE 0.9 % IR SOLN
Status: DC | PRN
  Administered 2013-10-28: 1000 mL

## 2013-10-28 MED ORDER — OXYTOCIN BOLUS FROM INFUSION: 500.0000 mL | INTRAVENOUS | Status: DC

## 2013-10-28 MED ORDER — FENTANYL CITRATE 0.05 MG/ML IJ SOLN: 100.0000 ug | INTRAMUSCULAR | Status: DC | PRN

## 2013-10-28 MED ORDER — PROMETHAZINE HCL 25 MG/ML IJ SOLN
6.2500 mg | INTRAMUSCULAR | Status: DC | PRN
  Administered 2013-10-28: 12.5 mg via INTRAVENOUS

## 2013-10-28 MED ORDER — OXYTOCIN 40 UNITS IN LACTATED RINGERS INFUSION - SIMPLE MED: 62.5000 mL/h | INTRAVENOUS | Status: DC

## 2013-10-28 MED ORDER — DIPHENHYDRAMINE HCL 50 MG/ML IJ SOLN: 25.0000 mg | INTRAMUSCULAR | Status: DC | PRN

## 2013-10-28 MED ORDER — DIPHENHYDRAMINE HCL 50 MG/ML IJ SOLN: 12.5000 mg | INTRAMUSCULAR | Status: DC | PRN

## 2013-10-28 MED ORDER — TERBUTALINE SULFATE 1 MG/ML IJ SOLN: 0.2500 mg | Freq: Once | INTRAMUSCULAR | Status: DC | PRN

## 2013-10-28 MED ORDER — IBUPROFEN 600 MG PO TABS: 600.0000 mg | ORAL_TABLET | Freq: Four times a day (QID) | ORAL | Status: DC | PRN

## 2013-10-28 MED ORDER — KETOROLAC TROMETHAMINE 30 MG/ML IJ SOLN
INTRAMUSCULAR | Status: AC
  Administered 2013-10-28: 30 mg via INTRAMUSCULAR
  Filled 2013-10-28: qty 1

## 2013-10-28 MED ORDER — FENTANYL CITRATE 0.05 MG/ML IJ SOLN
INTRAMUSCULAR | Status: DC | PRN
  Administered 2013-10-28: 25 ug via INTRATHECAL

## 2013-10-28 MED ORDER — METOCLOPRAMIDE HCL 5 MG/ML IJ SOLN
INTRAMUSCULAR | Status: DC | PRN
  Administered 2013-10-28 (×2): 5 mg via INTRAVENOUS

## 2013-10-28 MED ORDER — ACETAMINOPHEN 325 MG PO TABS: 650.0000 mg | ORAL_TABLET | ORAL | Status: DC | PRN

## 2013-10-28 MED ORDER — MORPHINE SULFATE 0.5 MG/ML IJ SOLN
INTRAMUSCULAR | Status: AC
  Filled 2013-10-28: qty 10

## 2013-10-28 MED ORDER — SIMETHICONE 80 MG PO CHEW
80.0000 mg | CHEWABLE_TABLET | ORAL | Status: DC | PRN
  Filled 2013-10-28: qty 1

## 2013-10-28 MED ORDER — LACTATED RINGERS IV SOLN
INTRAVENOUS | Status: DC | PRN
  Administered 2013-10-28: 10:00:00 via INTRAVENOUS

## 2013-10-28 MED ORDER — SCOPOLAMINE 1 MG/3DAYS TD PT72
1.0000 | MEDICATED_PATCH | Freq: Once | TRANSDERMAL | Status: AC
  Administered 2013-10-28: 1.5 mg via TRANSDERMAL

## 2013-10-28 MED ORDER — FENTANYL CITRATE 0.05 MG/ML IJ SOLN
25.0000 ug | INTRAMUSCULAR | Status: DC | PRN
  Administered 2013-10-28: 30 ug via INTRAVENOUS

## 2013-10-28 MED ORDER — LIDOCAINE HCL (PF) 1 % IJ SOLN: 30.0000 mL | INTRAMUSCULAR | Status: DC | PRN

## 2013-10-28 MED ORDER — PROMETHAZINE HCL 25 MG/ML IJ SOLN
INTRAMUSCULAR | Status: AC
  Administered 2013-10-28: 12.5 mg via INTRAVENOUS
  Filled 2013-10-28: qty 1

## 2013-10-28 MED ORDER — ONDANSETRON HCL 4 MG/2ML IJ SOLN
4.0000 mg | Freq: Three times a day (TID) | INTRAMUSCULAR | Status: DC | PRN
  Filled 2013-10-28: qty 2

## 2013-10-28 MED ORDER — OXYTOCIN 40 UNITS IN LACTATED RINGERS INFUSION - SIMPLE MED: 62.5000 mL/h | INTRAVENOUS | Status: AC

## 2013-10-28 MED ORDER — OXYTOCIN 10 UNIT/ML IJ SOLN
INTRAMUSCULAR | Status: AC
  Filled 2013-10-28: qty 4

## 2013-10-28 MED ORDER — LACTATED RINGERS IV SOLN
INTRAVENOUS | Status: DC | PRN
  Administered 2013-10-28 (×2): via INTRAVENOUS

## 2013-10-28 MED ORDER — CITRIC ACID-SODIUM CITRATE 334-500 MG/5ML PO SOLN
30.0000 mL | ORAL | Status: DC | PRN
  Administered 2013-10-28: 30 mL via ORAL
  Filled 2013-10-28: qty 15

## 2013-10-28 MED ORDER — KETOROLAC TROMETHAMINE 30 MG/ML IJ SOLN
30.0000 mg | Freq: Four times a day (QID) | INTRAMUSCULAR | Status: AC | PRN
  Administered 2013-10-28: 30 mg via INTRAMUSCULAR

## 2013-10-28 MED ORDER — HYDRALAZINE HCL 20 MG/ML IJ SOLN
10.0000 mg | Freq: Once | INTRAMUSCULAR | Status: AC
  Administered 2013-10-28: 13:00:00 via INTRAVENOUS
  Filled 2013-10-28: qty 1

## 2013-10-28 MED ORDER — SODIUM CHLORIDE 0.9 % IJ SOLN: 3.0000 mL | INTRAMUSCULAR | Status: DC | PRN

## 2013-10-28 MED ORDER — ONDANSETRON HCL 4 MG/2ML IJ SOLN
INTRAMUSCULAR | Status: AC
  Filled 2013-10-28: qty 2

## 2013-10-28 MED ORDER — TETANUS-DIPHTH-ACELL PERTUSSIS 5-2.5-18.5 LF-MCG/0.5 IM SUSP: 0.5000 mL | Freq: Once | INTRAMUSCULAR | Status: DC

## 2013-10-28 MED ORDER — BUPIVACAINE HCL (PF) 0.5 % IJ SOLN
INTRAMUSCULAR | Status: AC
  Filled 2013-10-28: qty 30

## 2013-10-28 MED ORDER — NALBUPHINE HCL 10 MG/ML IJ SOLN: 5.0000 mg | INTRAMUSCULAR | Status: DC | PRN

## 2013-10-28 MED ORDER — MEASLES, MUMPS & RUBELLA VAC ~~LOC~~ INJ
0.5000 mL | INJECTION | Freq: Once | SUBCUTANEOUS | Status: AC
  Administered 2013-10-31: 0.5 mL via SUBCUTANEOUS
  Filled 2013-10-28 (×2): qty 0.5

## 2013-10-28 MED ORDER — IBUPROFEN 600 MG PO TABS
600.0000 mg | ORAL_TABLET | Freq: Four times a day (QID) | ORAL | Status: DC
  Administered 2013-10-28 – 2013-10-31 (×11): 600 mg via ORAL
  Filled 2013-10-28 (×12): qty 1

## 2013-10-28 MED ORDER — METOCLOPRAMIDE HCL 5 MG/ML IJ SOLN: 10.0000 mg | Freq: Three times a day (TID) | INTRAMUSCULAR | Status: DC | PRN

## 2013-10-28 MED ORDER — TRAMADOL HCL 50 MG PO TABS
50.0000 mg | ORAL_TABLET | Freq: Four times a day (QID) | ORAL | Status: DC | PRN
  Administered 2013-10-29 (×4): 50 mg via ORAL
  Filled 2013-10-28 (×4): qty 1

## 2013-10-28 MED ORDER — ACETAMINOPHEN 500 MG PO TABS
1000.0000 mg | ORAL_TABLET | Freq: Four times a day (QID) | ORAL | Status: AC
  Administered 2013-10-28: 1000 mg via ORAL
  Filled 2013-10-28 (×2): qty 2

## 2013-10-28 MED ORDER — BUPIVACAINE HCL (PF) 0.5 % IJ SOLN
INTRAMUSCULAR | Status: DC | PRN
  Administered 2013-10-28: 30 mL

## 2013-10-28 MED ORDER — LACTATED RINGERS IV SOLN: 500.0000 mL | INTRAVENOUS | Status: DC | PRN

## 2013-10-28 SURGICAL SUPPLY — 38 items
BARRIER ADHS 3X4 INTERCEED (GAUZE/BANDAGES/DRESSINGS) IMPLANT
CLAMP CORD UMBIL (MISCELLANEOUS) IMPLANT
CLOTH BEACON ORANGE TIMEOUT ST (SAFETY) ×3 IMPLANT
CONTAINER PREFILL 10% NBF 15ML (MISCELLANEOUS) IMPLANT
DRAPE LG THREE QUARTER DISP (DRAPES) IMPLANT
DRSG OPSITE POSTOP 4X10 (GAUZE/BANDAGES/DRESSINGS) ×3 IMPLANT
DURAPREP 26ML APPLICATOR (WOUND CARE) ×3 IMPLANT
ELECT REM PT RETURN 9FT ADLT (ELECTROSURGICAL) ×3
ELECTRODE REM PT RTRN 9FT ADLT (ELECTROSURGICAL) ×1 IMPLANT
EXTRACTOR VACUUM KIWI (MISCELLANEOUS) IMPLANT
GLOVE BIO SURGEON STRL SZ 6.5 (GLOVE) ×2 IMPLANT
GLOVE BIO SURGEONS STRL SZ 6.5 (GLOVE) ×1
GOWN STRL REUS W/TWL LRG LVL3 (GOWN DISPOSABLE) ×6 IMPLANT
KIT ABG SYR 3ML LUER SLIP (SYRINGE) IMPLANT
NEEDLE HYPO 25X5/8 SAFETYGLIDE (NEEDLE) IMPLANT
NEEDLE SPNL 18GX3.5 QUINCKE PK (NEEDLE) ×3 IMPLANT
NS IRRIG 1000ML POUR BTL (IV SOLUTION) ×3 IMPLANT
PACK C SECTION WH (CUSTOM PROCEDURE TRAY) ×3 IMPLANT
PAD ABD 7.5X8 STRL (GAUZE/BANDAGES/DRESSINGS) ×3 IMPLANT
PAD OB MATERNITY 4.3X12.25 (PERSONAL CARE ITEMS) ×3 IMPLANT
SPONGE SURGIFOAM ABS GEL 12-7 (HEMOSTASIS) ×3 IMPLANT
SUT PDS AB 0 CTX 60 (SUTURE) IMPLANT
SUT VIC AB 0 CT1 27 (SUTURE)
SUT VIC AB 0 CT1 27XBRD ANBCTR (SUTURE) IMPLANT
SUT VIC AB 0 CT1 36 (SUTURE) IMPLANT
SUT VIC AB 2-0 CT1 27 (SUTURE) ×2
SUT VIC AB 2-0 CT1 TAPERPNT 27 (SUTURE) ×1 IMPLANT
SUT VIC AB 2-0 CTX 36 (SUTURE) ×6 IMPLANT
SUT VIC AB 3-0 CT1 27 (SUTURE) ×2
SUT VIC AB 3-0 CT1 TAPERPNT 27 (SUTURE) ×1 IMPLANT
SUT VIC AB 3-0 SH 27 (SUTURE)
SUT VIC AB 3-0 SH 27X BRD (SUTURE) IMPLANT
SUT VIC AB 4-0 KS 27 (SUTURE) ×6 IMPLANT
SYR 30ML LL (SYRINGE) ×3 IMPLANT
TAPE CLOTH SURG 4X10 WHT LF (GAUZE/BANDAGES/DRESSINGS) ×3 IMPLANT
TOWEL OR 17X24 6PK STRL BLUE (TOWEL DISPOSABLE) ×3 IMPLANT
TRAY FOLEY CATH 14FR (SET/KITS/TRAYS/PACK) ×3 IMPLANT
WATER STERILE IRR 1000ML POUR (IV SOLUTION) ×3 IMPLANT

## 2013-10-28 NOTE — Progress Notes (Addendum)
Tabitha Holland is a 28 y.o. G3P2002 at [redacted]w[redacted]d by 9wk admitted for non reassuring fetal heart tracing (recurrent spontaneous prolonged decels - multiple during overnight observation)  Subjective:   Objective: BP 161/92  Pulse 43  Temp(Src) 97.6 F (36.4 C) (Oral)  Resp 16  Ht 5\' 11"  (1.803 m)  Wt 165.109 kg (364 lb)  BMI 50.79 kg/m2  SpO2 100%  LMP 02/01/2013      FHT:  FHR: 140s bpm, variability: moderate,  accelerations:  Present,  decelerations:  Present occassional prolonged decels UC:   none SVE:   Dilation: 1.5 Effacement (%): Thick Station: -3 Exam by:: DCALLAWAY, RN  Labs: Lab Results  Component Value Date   WBC 9.7 10/28/2013   HGB 9.7* 10/28/2013   HCT 29.3* 10/28/2013   MCV 84.2 10/28/2013   PLT 181 10/28/2013    Clinic [place]  Dating LMP/Ultrasound: 9  weeks        Ultrasound consistent with LMP: Yes  Genetic Screen  Quad: negative                 NIPS:  Anatomic [place]  Normal but limited view of face and heart- follow up recommended in mid to end of March  GTT Early:  86             Third trimester: 76  TDaP vaccine 09/25/2013  GBS Negative  Baby Food  breast  Contraception  undecided  Circumcision  Yes  Pediatrician [facility]  Support Person:FOB  Assessment / Plan: Given recurrent prolonged decel will start induction of labor.  Labor: Unable to place foley bulb, start with cytotec 25 PV Preeclampsia:  no signs or symptoms of toxicity Fetal Wellbeing:  Category I Pain Control:  Epidural and Fentanyl PRN I/D:  GBS neg Anticipated MOD:  NSVD  ODOM, MICHAEL RYAN 10/28/2013, 7:02 AM

## 2013-10-28 NOTE — Lactation Note (Signed)
This note was copied from the chart of [place]. Lactation Consultation Note Initial visit at 34 hours of age.  [facility] LC resources given and discussed.  Encouraged to feed with early cues on demand.  Early newborn behavior discussed.  Hand expression demonstrated with colostrum visible.  Assisted with football hold on right breast.  Mom has very large pendulous breasts.  Baby latches well with wide flanged mouth.  Mom to call for assist as needed.   Patient Name: Tabitha Holland RSWNI'O Date: 10/28/2013 Reason for consult: Initial assessment   Maternal Data Has patient been taught Hand Expression?: Yes Does the patient have breastfeeding experience prior to this delivery?: Yes  Feeding Feeding Type: Breast Fed Length of feed:  (few mintues)  LATCH Score/Interventions Latch: Grasps breast easily, tongue down, lips flanged, rhythmical sucking.  Audible Swallowing: A few with stimulation  Type of Nipple: Everted at rest and after stimulation  Comfort (Breast/Nipple): Soft / non-tender     Hold (Positioning): Assistance needed to correctly position infant at breast and maintain latch. Intervention(s): Breastfeeding basics reviewed;Support Pillows;Position options;Skin to skin  LATCH Score: 8  Lactation Tools Discussed/Used     Consult Status Consult Status: Follow-up Date: 10/29/13 Follow-up type: In-patient    Shoptaw, Justine Null 10/28/2013, 11:22 PM

## 2013-10-28 NOTE — Anesthesia Preprocedure Evaluation (Signed)
Anesthesia Evaluation  Patient identified by MRN, date of birth, ID band Patient awake    Reviewed: Allergy & Precautions, H&P , Patient's Chart, lab work & pertinent test results  Airway Mallampati: III TM Distance: >3 FB Neck ROM: full    Dental   Pulmonary Current Smoker,  breath sounds clear to auscultation        Cardiovascular hypertension, Rhythm:regular Rate:Normal     Neuro/Psych    GI/Hepatic   Endo/Other  Morbid obesity  Renal/GU      Musculoskeletal   Abdominal   Peds  Hematology   Anesthesia Other Findings   Reproductive/Obstetrics (+) Pregnancy                           Anesthesia Physical Anesthesia Plan  ASA: III  Anesthesia Plan: Epidural   Post-op Pain Management:    Induction:   Airway Management Planned:   Additional Equipment:   Intra-op Plan:   Post-operative Plan:   Informed Consent: I have reviewed the patients History and Physical, chart, labs and discussed the procedure including the risks, benefits and alternatives for the proposed anesthesia with the patient or authorized representative who has indicated his/her understanding and acceptance.     Plan Discussed with:   Anesthesia Plan Comments:         Anesthesia Quick Evaluation

## 2013-10-28 NOTE — Anesthesia Postprocedure Evaluation (Signed)
Anesthesia Post Note  Patient: Tabitha Holland  Procedure(s) Performed: Procedure(s) (LRB): CESAREAN SECTION (N/A)  Anesthesia type: Spinal  Patient location: PACU  Post pain: Pain level controlled  Post assessment: Post-op Vital signs reviewed  Last Vitals:  Filed Vitals:   10/28/13 1130  BP: 144/76  Pulse: 60  Temp:   Resp: 24    Post vital signs: Reviewed  Level of consciousness: awake  Complications: No apparent anesthesia complications

## 2013-10-28 NOTE — Consult Note (Signed)
Neonatology Note:   Attendance at C-section:    I was asked by Dr. Hulan Fray to attend this Stat primary C/S at term due to fetal bradycardia. The mother is a G3P2 O pos, Rubella non-immune, GBS neg with morbid obesity and HTN. ROM at delivery, fluid with moderate meconium. CAN times 2 loosely. Infant vigorous with good spontaneous cry and tone. Needed only bulb suctioning for a small amount of thin, green fluid. Ap 9/9. Lungs clear to ausc in DR. To CN to care of Pediatrician.   Real Cons, MD

## 2013-10-28 NOTE — Anesthesia Procedure Notes (Signed)
Spinal  Patient location during procedure: OR Start time: 10/28/2013 10:08 AM Staffing Anesthesiologist: Rudean Curt Performed by: anesthesiologist  Preanesthetic Checklist Completed: patient identified, site marked, surgical consent, pre-op evaluation, timeout performed, IV checked, risks and benefits discussed and monitors and equipment checked Spinal Block Patient position: sitting Prep: DuraPrep Patient monitoring: heart rate, cardiac monitor, continuous pulse ox and blood pressure Approach: midline Location: L3-4 Injection technique: single-shot Needle Needle type: Sprotte  Needle gauge: 24 G Needle length: 9 cm Assessment Sensory level: T4 Additional Notes Patient identified.  Risk benefits discussed including failed block, incomplete pain control, headache, nerve damage, paralysis, blood pressure changes, nausea, vomiting, reactions to medication both toxic or allergic, and postpartum back pain.  Patient expressed understanding and wished to proceed.  All questions were answered.  Sterile technique used throughout procedure.  CSF was clear.  No parasthesia or other complications.  Please see nursing notes for vital signs.

## 2013-10-28 NOTE — Op Note (Signed)
[name] PROCEDURE DATE: 10/27/2013 - 10/28/2013  PREOPERATIVE DIAGNOSES: Intrauterine pregnancy at  [redacted]w[redacted]d weeks gestation; non-reassuring fetal status; fetal intolerance to labor  POSTOPERATIVE DIAGNOSES: The same  PROCEDURE: Primary Low Transverse Cesarean Section  SURGEON:  Dr. Clovia Cuff  ASSISTANT:  Dr. Ebbie Latus  ANESTHESIOLOGIST: Dr. Rudean Curt  INDICATIONS: Tabitha Holland is a 28 y.o. [id number] at [redacted]w[redacted]d here for cesarean section secondary to the indications listed under preoperative diagnoses; please see preoperative note for further details.  Pt was admitted for several prolonged decels overnight and started induction this AM with cytotec.  Within 2 hours, baby's HR dropped to the 60s for 6 min again. Being that the pt was closed and a long way from vaginal delivery, the decision was made to proceed with cesarean section. By the time we were in the OR, the HR had improved to 130 so we proceeded in urgent fashion. The risks of cesarean section were discussed with the patient including but were not limited to: bleeding which may require transfusion or reoperation; infection which may require antibiotics; injury to bowel, bladder, ureters or other surrounding organs; injury to the fetus; need for additional procedures including hysterectomy in the event of a life-threatening hemorrhage; placental abnormalities wth subsequent pregnancies, incisional problems, thromboembolic phenomenon and other postoperative/anesthesia complications.   The patient concurred with the proposed plan, giving informed written consent for the procedure.    FINDINGS:  Viable female infant in cephalic presentation.  Apgars 9 and 9.  Meconium stained amniotic fluid and stained membranes.  Tight nuchal cord x2.  Intact placenta, three vessel cord.  Normal uterus, fallopian tubes and ovaries bilaterally.  ANESTHESIA: Spinal INTRAVENOUS FLUIDS: 1600 ml ESTIMATED BLOOD LOSS: 800 ml URINE OUTPUT:  150  ml SPECIMENS: Placenta sent to L&D COMPLICATIONS: None immediate  PROCEDURE IN DETAIL:  The patient preoperatively received intravenous antibiotics and had sequential compression devices applied to her lower extremities.  She was then taken to the operating room where spinal anesthesia was administered and was found to be adequate. She was then placed in a dorsal supine position with a leftward tilt, and prepped and draped in a sterile manner.  A foley catheter was placed into her bladder and attached to constant gravity.   Timeout procedure was done. After adequate anesthesia was assured 30 mL for 0.5% Marcaine was injected into the subcutaneous tissue about 2 cm above the symphysis pubis. An incision was made there. The incision was carried down through the subcutaneous tissue to the fascia. The fascia was scored the midline and extended bilaterally. The middle 20% of the rectus muscles were separated in a transverse fashion using electrosurgical technique. Excellent hemostasis was maintained. The peritoneum was entered with hemostats. Peritoneal incision was extended bilaterally with the Bovie. The bladder blade was placed. A transverse incision was made on the well-developed lower uterine segment. The uterine incision was extended with traction on each side. Amniotomy was performed with a hemostat. Meconium stained fluid was noted and membranes were stained with meconium. The baby was delivered from a vertex presentation with a tight nuchal cord x2. The baby's cord was clamped and cut, and he was transferred to the NICU personnel for routine care. The placenta was delivered intact with traction. The uterus was left in situ and the interior was cleaned with a dry lap sponge. The uterine incision was closed with 2-0 Vicryl running locking suture in two layers, the second imbricating the first. Excellent hemostasis was noted. By tilting the uterus each  side was able to visualize the adnexa, and they were  normal. The rectus fascia rectus muscles were noted be hemostatic as well. The fascia was closed with a 0 Vicryl suture in a running nonlocking fashion. No defects were palpable. The subcutaneous tissue was irrigated, clean, and dried. 2-0 plain was used to close the subcutaneous space. A subcuticular closure was done with a 3-0 Vicryl suture. Steri-Strips are placed. Excellent cosmetic results were obtained. She was taken to the recovery room in stable condition. She tolerated the procedure well. Sponge and instrument counts correct x2.   Tabitha Holland, Tabitha Pancoast, MD

## 2013-10-28 NOTE — Progress Notes (Signed)
Patient ID: Tabitha Holland, female   DOB: 13-Feb-1986, 28 y.o.   MRN: [id number] HR down for now 6 min and pt still closed on cytotec. Discussed due to fetal intolerance to labor need for c/s.    The risks of cesarean section discussed with the patient included but were not limited to: bleeding which may require transfusion or reoperation; infection which may require antibiotics; injury to bowel, bladder, ureters or other surrounding organs; injury to the fetus; need for additional procedures including hysterectomy in the event of a life-threatening hemorrhage; placental abnormalities wth subsequent pregnancies, incisional problems, thromboembolic phenomenon and other postoperative/anesthesia complications. The patient concurred with the proposed plan, giving informed written consent for the procedure.   Patient has been NPO since last night  she will remain NPO for procedure. Anesthesia and OR aware. Preoperative prophylactic antibiotics and SCDs ordered on call to the OR.  To OR when ready.

## 2013-10-28 NOTE — Anesthesia Postprocedure Evaluation (Signed)
  Anesthesia Post-op Note  Patient: Tabitha Holland  Procedure(s) Performed: Procedure(s): CESAREAN SECTION (N/A)  Patient Location: Mother/Baby  Anesthesia Type:Spinal  Level of Consciousness: awake, alert  and oriented  Airway and Oxygen Therapy: Patient Spontanous Breathing  Post-op Pain: none  Post-op Assessment: Post-op Vital signs reviewed, Patient's Cardiovascular Status Stable, Respiratory Function Stable, No headache, No backache, No residual numbness and No residual motor weakness  Post-op Vital Signs: Reviewed and stable  Last Vitals:  Filed Vitals:   10/28/13 1533  BP: 157/89  Pulse: 59  Temp: 36.8 C  Resp:     Complications: No apparent anesthesia complications

## 2013-10-28 NOTE — Addendum Note (Signed)
Addendum created 10/28/13 1746 by Jonna Munro, CRNA   Modules edited: Notes Section   Notes Section:  File: [id number]

## 2013-10-28 NOTE — Transfer of Care (Signed)
Immediate Anesthesia Transfer of Care Note  Patient: Tabitha Holland  Procedure(s) Performed: Procedure(s): CESAREAN SECTION (N/A)  Patient Location: PACU  Anesthesia Type:Spinal  Level of Consciousness: awake, alert  and oriented  Airway & Oxygen Therapy: Patient Spontanous Breathing  Post-op Assessment: Report given to PACU RN and Post -op Vital signs reviewed and stable  Post vital signs: Reviewed and stable  Complications: No apparent anesthesia complications

## 2013-10-29 ENCOUNTER — Encounter (HOSPITAL_COMMUNITY): Payer: Self-pay | Admitting: Obstetrics & Gynecology

## 2013-10-29 LAB — BIRTH TISSUE RECOVERY COLLECTION (PLACENTA DONATION)

## 2013-10-29 LAB — CBC
HEMATOCRIT: 26.9 % — AB (ref 36.0–46.0)
HEMOGLOBIN: 9 g/dL — AB (ref 12.0–15.0)
MCH: 28.5 pg (ref 26.0–34.0)
MCHC: 33.5 g/dL (ref 30.0–36.0)
MCV: 85.1 fL (ref 78.0–100.0)
Platelets: 175 10*3/uL (ref 150–400)
RBC: 3.16 MIL/uL — ABNORMAL LOW (ref 3.87–5.11)
RDW: 13.9 % (ref 11.5–15.5)
WBC: 8.9 10*3/uL (ref 4.0–10.5)

## 2013-10-29 LAB — RPR

## 2013-10-29 NOTE — Progress Notes (Signed)
Subjective: Postpartum Day 1: Cesarean Delivery Patient reports tolerating PO and no problems voiding.  Has not passed flatus yet. Pain controlled with current regimen. Breastfeeding is going well. Discussed birth control this am. Patient states that she will consider her options and let us know her decision prior to discharge. She reports minimal vaginal discharg.  No other complaints.   Objective: Vital signs in last 24 hours: Temp:  [97.9 F (36.6 C)-98.8 F (37.1 C)] 97.9 F (36.6 C) (07/01 0556) Pulse Rate:  [42-85] 56 (07/01 0556) Resp:  [17-29] 20 (07/01 0556) BP: (123-186)/(65-103) 142/74 mmHg (07/01 0556) SpO2:  [96 %-100 %] 98 % (07/01 0556)  Physical Exam:  General: cooperative Lochia: appropriate Uterine Fundus: firm Incision: no significant drainage, no dehiscence, no significant erythema DVT Evaluation: No cords or calf tenderness. No significant calf/ankle edema.   Recent Labs  10/28/13 0934 10/29/13 0605  HGB 10.0* 9.0*  HCT 29.9* 26.9*    Assessment/Plan: Status post Cesarean section. Doing well postoperatively.  Continue current care.  1. Birth control - currently undecided. Pt. Will inform us of her decision prior to discharge.   2. Plan to remove pressure dressing after 48 hours.   3. Lactation today. Pt. Is breastfeeding.   Ambriella Kitt G 10/29/2013, 7:54 AM

## 2013-10-29 NOTE — Progress Notes (Signed)
CSW acknowledges consult for hx of THC use.  CSW reviewed PNR, which does not state THC use.  CSW notes in Nursing Admission Summary that there is a note stating "Marijuana use 1 year ago-not since pregnancy".  CSW is screening out referral at this time.  Please contact CSW if current concerns arise or by patient's request.

## 2013-10-29 NOTE — Anesthesia Postprocedure Evaluation (Signed)
  Anesthesia Post-op Note  Patient: Tabitha Holland  Procedure(s) Performed: Procedure(s): CESAREAN SECTION (N/A)  Patient Location: PACU and Mother/Baby  Anesthesia Type:Spinal  Level of Consciousness: awake, alert  and oriented  Airway and Oxygen Therapy: Patient Spontanous Breathing  Post-op Pain: mild  Post-op Assessment: Post-op Vital signs reviewed, Patient's Cardiovascular Status Stable, Respiratory Function Stable, No signs of Nausea or vomiting, Adequate PO intake and Pain level controlled  Post-op Vital Signs: Reviewed and stable  Last Vitals:  Filed Vitals:   10/29/13 0556  BP: 142/74  Pulse: 56  Temp: 36.6 C  Resp: 20    Complications: No apparent anesthesia complications

## 2013-10-29 NOTE — Progress Notes (Signed)
I have seen and examined this patient and agree with above documentation in the resident's note.   Ebbie Latus, M.D. [facility] Fellow 10/29/2013 8:12 AM

## 2013-10-29 NOTE — Addendum Note (Signed)
Addendum created 10/29/13 0818 by Elenore Paddy, CRNA   Modules edited: Notes Section   Notes Section:  File: [id number]

## 2013-10-29 NOTE — Progress Notes (Signed)
Subjective: Postpartum Day 1: Cesarean Delivery Patient reports tolerating PO and no problems voiding. She is breast feeding and would like her son circumcised.  Objective: Vital signs in last 24 hours: Temp:  [97.9 F (36.6 C)-98.8 F (37.1 C)] 97.9 F (36.6 C) (07/01 0556) Pulse Rate:  [42-85] 56 (07/01 0556) Resp:  [17-29] 20 (07/01 0556) BP: (123-186)/(65-103) 142/74 mmHg (07/01 0556) SpO2:  [96 %-100 %] 98 % (07/01 0556)  Physical Exam:  General: alert Lochia: appropriate Uterine Fundus: firm Incision: dressing c/d/i DVT Evaluation: No evidence of DVT seen on physical exam.   Recent Labs  10/28/13 0934 10/29/13 0605  HGB 10.0* 9.0*  HCT 29.9* 26.9*    Assessment/Plan: Status post Cesarean section. Doing well postoperatively.  Continue current care.  Nikki Glanzer C. 10/29/2013, 7:55 AM

## 2013-10-30 ENCOUNTER — Encounter: Payer: Self-pay | Admitting: Obstetrics & Gynecology

## 2013-10-30 MED ORDER — HYDROCHLOROTHIAZIDE 25 MG PO TABS
25.0000 mg | ORAL_TABLET | Freq: Every day | ORAL | Status: DC
  Administered 2013-10-30 – 2013-10-31 (×2): 25 mg via ORAL
  Filled 2013-10-30 (×4): qty 1

## 2013-10-30 MED ORDER — HYDROMORPHONE HCL 2 MG PO TABS
2.0000 mg | ORAL_TABLET | ORAL | Status: DC | PRN
  Administered 2013-10-30 (×2): 2 mg via ORAL
  Administered 2013-10-30 – 2013-10-31 (×5): 4 mg via ORAL
  Filled 2013-10-30 (×5): qty 2
  Filled 2013-10-30 (×2): qty 1

## 2013-10-30 NOTE — Progress Notes (Signed)
Dr Skeet Simmer called and given report of patients elevated B/P. Patient denies any headache, blurred vision, or any epigastric discomfort at this time. Patient did complain of poor pain control. Orders received to start patient on Dilaudid and to reevaluate B/P. To call Dr Skeet Simmer if pressures remain elevated.

## 2013-10-30 NOTE — Progress Notes (Signed)
Subjective: Postpartum Day 2: Cesarean Delivery Patient reports incisional pain, tolerating PO, + flatus and no problems voiding.  Incisional pain difficult to decrease.  Denies headache, vision changes, or epigastric pain. Desires Mirena for family planning.   Objective: Vital signs in last 24 hours: Temp:  [97.3 F (36.3 C)-98.3 F (36.8 C)] 98.2 F (36.8 C) (07/02 0531) Pulse Rate:  [51-83] 51 (07/02 0531) Resp:  [18] 18 (07/02 0531) BP: (120-169)/(69-105) 169/105 mmHg (07/02 0531) SpO2:  [97 %] 97 % (07/01 0945) Filed Vitals:   10/29/13 0556 10/29/13 0945 10/29/13 1743 10/30/13 0531  BP: 142/74 120/80 139/69 169/105  Pulse: 56 83 60 51  Temp: 97.9 F (36.6 C) 98.3 F (36.8 C) 97.3 F (36.3 C) 98.2 F (36.8 C)  TempSrc: Oral Oral Oral Oral  Resp: 20 18 18 18   Height:      Weight:      SpO2: 98% 97%       Physical Exam:  General: alert, cooperative and appears stated age CVS:  RRR, without murmur, gallops, or rubs Lungs:  CTA bilat ABD:  +BSx4, normal Lochia: appropriate Uterine Fundus: firm Incision: no significant drainage, dressing clean, dry, and intact DVT Evaluation: No evidence of DVT seen on physical exam. Negative Homan's sign.   Recent Labs  10/28/13 0934 10/29/13 0605  HGB 10.0* 9.0*  HCT 29.9* 26.9*    Assessment/Plan: Status post Cesarean section. Postoperative course complicated by elevated blood pressure, may be pain related.  Recheck after meds.  Continue current care. Plan for discharge tomorrow.    [facility] 10/30/2013, 6:44 AM

## 2013-10-31 MED ORDER — HYDROCHLOROTHIAZIDE 25 MG PO TABS: 25.0000 mg | ORAL_TABLET | Freq: Every day | ORAL | Status: DC

## 2013-10-31 MED ORDER — IBUPROFEN 600 MG PO TABS: 600.0000 mg | ORAL_TABLET | Freq: Four times a day (QID) | ORAL | Status: DC | PRN

## 2013-10-31 MED ORDER — HYDROMORPHONE HCL 2 MG PO TABS: 2.0000 mg | ORAL_TABLET | ORAL | Status: DC | PRN

## 2013-10-31 NOTE — Progress Notes (Signed)
CSW acknowledges consult for MOB tearful overnight and feeling overwhelmed.  CSW is not able to meet with MOB at this time and understands that she has a discharge written.  CSW contacted Valetta Fuller C./Chaplain to see if she could meet with MOB for emotional support prior to discharge this morning and contact CSW if there are social issues.  She agreed.

## 2013-10-31 NOTE — Progress Notes (Signed)
Tabitha Holland is facing some difficult family dynamics and some difficult decisions.  She has a history of some trauma and was adopted at the age of 44.  She has some support from her adoptive family, but she still has limited support resources.  I feel that she would benefit from  counseling and helped her find a list of therapists who take her insurance.  She denies suicidal ideation or any thoughts of hurting herself.  I have her permission to follow up by phone next week.  [employer] Pager, [phone] 2:01 PM   10/31/13 1300  Clinical Encounter Type  Visited With Patient  Visit Type Spiritual support  Referral From Social work  Spiritual Encounters  Spiritual Needs Emotional  Stress Factors  Patient Stress Factors Family relationships;Financial concerns;Exhausted

## 2013-10-31 NOTE — Discharge Summary (Signed)
Attestation of Attending Supervision of Advanced Practitioner (PA/CNM/NP): Evaluation and management procedures were performed by the Advanced Practitioner under my supervision and collaboration.  I have reviewed the Advanced Practitioner's note and chart, and I agree with the management and plan.  Skye Plamondon, MD, FACOG Attending Obstetrician & Gynecologist Faculty Practice, [facility]   

## 2013-10-31 NOTE — Discharge Summary (Signed)
Obstetric Discharge Summary Reason for Admission: Hypertension and swelling Prenatal Procedures: NST Intrapartum Procedures: cesarean: low cervical, transverse Postpartum Procedures: none Complications-Operative and Postpartum: none Hemoglobin  Date Value Ref Range Status  10/29/2013 9.0* 12.0 - 15.0 g/dL Final     HCT  Date Value Ref Range Status  10/29/2013 26.9* 36.0 - 46.0 % Final  Hospital Course: None  Chief Complaint: Leg Swelling and Back Pain  Tabitha Holland is 28 y.o. G3P2002 at [redacted]w[redacted]d presents complaining of Leg Swelling and Back Pain  She states that she has been having swelling in her lower extremities that is equal, and resolves with elevation. Pt states that the right is more tender than the left. Pt also is having lower abdominal pressure that is worse when she stands. Pt denies contractions, states normal movement, no loss of fluid or blood. Pt has an appt on Thursday.  PROCEDURE DATE: 10/27/2013 - 10/28/2013  PREOPERATIVE DIAGNOSES: Intrauterine pregnancy at [redacted]w[redacted]d weeks gestation; non-reassuring fetal status; fetal intolerance to labor  POSTOPERATIVE DIAGNOSES: The same  PROCEDURE: Primary Low Transverse Cesarean Section  SURGEON: Dr. Clovia Cuff  ASSISTANT: Dr. Ebbie Latus  ANESTHESIOLOGIST: Dr. Rudean Curt   FINDINGS: Viable female infant in cephalic presentation. Apgars 9 and 9. Meconium stained amniotic fluid and stained membranes. Tight nuchal cord x2. Intact placenta, three vessel cord. Normal uterus, fallopian tubes and ovaries bilaterally.   SHe has done well postoperatively except for labile hypertension. Labs were normal as was Pr/Cr ratrio. She has elevated BP when her partner is in the room. Will send her home on HCTZ.  Physical Exam:  General: alert, cooperative and no distress Lochia: appropriate Uterine Fundus: firm Incision: healing well, no significant drainage DVT Evaluation: No evidence of DVT seen on physical exam.  Discharge Diagnoses: Term  Pregnancy-delivered  Discharge Information: Date: 10/31/2013 Activity: unrestricted and pelvic rest Diet: routine Medications: PNV, Ibuprofen and Dilaudid Condition: stable and improved Instructions: refer to practice specific booklet Discharge to: home Follow-up Information   Follow up with [facility] In 2 weeks. (For BP check, Someone from clinic will call with appointment)    Contact information:   [place] [address] [phone]      Newborn Data: Live born female  Birth Weight: 6 lb 15.6 oz (3165 g) APGAR: 9, 9  Home with mother.  [facility] 10/31/2013, 7:33 AM

## 2013-11-05 ENCOUNTER — Telehealth: Payer: Self-pay | Admitting: *Deleted

## 2013-11-05 NOTE — Telephone Encounter (Signed)
Caller requested information related to date of and method of delivery as well as the time of her restrictions. I called back and left message of the requested information.

## 2013-12-01 ENCOUNTER — Encounter: Payer: Self-pay | Admitting: Obstetrics & Gynecology

## 2013-12-01 ENCOUNTER — Ambulatory Visit (INDEPENDENT_AMBULATORY_CARE_PROVIDER_SITE_OTHER): Payer: BC Managed Care – PPO | Admitting: Obstetrics & Gynecology

## 2013-12-01 NOTE — Patient Instructions (Signed)
Etonogestrel implant What is this medicine? ETONOGESTREL (et oh noe JES trel) is a contraceptive (birth control) device. It is used to prevent pregnancy. It can be used for up to 3 years. This medicine may be used for other purposes; ask your health care provider or pharmacist if you have questions. COMMON BRAND NAME(S): Implanon, Nexplanon What should I tell my health care provider before I take this medicine? They need to know if you have any of these conditions: -abnormal vaginal bleeding -blood vessel disease or blood clots -cancer of the breast, cervix, or liver -depression -diabetes -gallbladder disease -headaches -heart disease or recent heart attack -high blood pressure -high cholesterol -kidney disease -liver disease -renal disease -seizures -tobacco smoker -an unusual or allergic reaction to etonogestrel, other hormones, anesthetics or antiseptics, medicines, foods, dyes, or preservatives -pregnant or trying to get pregnant -breast-feeding How should I use this medicine? This device is inserted just under the skin on the inner side of your upper arm by a health care professional. Talk to your pediatrician regarding the use of this medicine in children. Special care may be needed. Overdosage: If you think you've taken too much of this medicine contact a poison control center or emergency room at once. Overdosage: If you think you have taken too much of this medicine contact a poison control center or emergency room at once. NOTE: This medicine is only for you. Do not share this medicine with others. What if I miss a dose? This does not apply. What may interact with this medicine? Do not take this medicine with any of the following medications: -amprenavir -bosentan -fosamprenavir This medicine may also interact with the following medications: -barbiturate medicines for inducing sleep or treating seizures -certain medicines for fungal infections like ketoconazole and  itraconazole -griseofulvin -medicines to treat seizures like carbamazepine, felbamate, oxcarbazepine, phenytoin, topiramate -modafinil -phenylbutazone -rifampin -some medicines to treat HIV infection like atazanavir, indinavir, lopinavir, nelfinavir, tipranavir, ritonavir -St. John's wort This list may not describe all possible interactions. Give your health care provider a list of all the medicines, herbs, non-prescription drugs, or dietary supplements you use. Also tell them if you smoke, drink alcohol, or use illegal drugs. Some items may interact with your medicine. What should I watch for while using this medicine? This product does not protect you against HIV infection (AIDS) or other sexually transmitted diseases. You should be able to feel the implant by pressing your fingertips over the skin where it was inserted. Tell your doctor if you cannot feel the implant. What side effects may I notice from receiving this medicine? Side effects that you should report to your doctor or health care professional as soon as possible: -allergic reactions like skin rash, itching or hives, swelling of the face, lips, or tongue -breast lumps -changes in vision -confusion, trouble speaking or understanding -dark urine -depressed mood -general ill feeling or flu-like symptoms -light-colored stools -loss of appetite, nausea -right upper belly pain -severe headaches -severe pain, swelling, or tenderness in the abdomen -shortness of breath, chest pain, swelling in a leg -signs of pregnancy -sudden numbness or weakness of the face, arm or leg -trouble walking, dizziness, loss of balance or coordination -unusual vaginal bleeding, discharge -unusually weak or tired -yellowing of the eyes or skin Side effects that usually do not require medical attention (Report these to your doctor or health care professional if they continue or are bothersome.): -acne -breast pain -changes in  weight -cough -fever or chills -headache -irregular menstrual bleeding -itching, burning, and   vaginal discharge -pain or difficulty passing urine -sore throat This list may not describe all possible side effects. Call your doctor for medical advice about side effects. You may report side effects to FDA at 1-800-FDA-1088. Where should I keep my medicine? This drug is given in a hospital or clinic and will not be stored at home. NOTE: This sheet is a summary. It may not cover all possible information. If you have questions about this medicine, talk to your doctor, pharmacist, or health care provider.  2015, Elsevier/Gold Standard. (2011-10-23 15:37:45) Intrauterine Device Information An intrauterine device (IUD) is inserted into your uterus to prevent pregnancy. There are two types of IUDs available:   Copper IUD--This type of IUD is wrapped in copper wire and is placed inside the uterus. Copper makes the uterus and fallopian tubes produce a fluid that kills sperm. The copper IUD can stay in place for 10 years.  Hormone IUD--This type of IUD contains the hormone progestin (synthetic progesterone). The hormone thickens the cervical mucus and prevents sperm from entering the uterus. It also thins the uterine lining to prevent implantation of a fertilized egg. The hormone can weaken or kill the sperm that get into the uterus. One type of hormone IUD can stay in place for 5 years, and another type can stay in place for 3 years. Your health care provider will make sure you are a good candidate for a contraceptive IUD. Discuss with your health care provider the possible side effects.  ADVANTAGES OF AN INTRAUTERINE DEVICE  IUDs are highly effective, reversible, long acting, and low maintenance.   There are no estrogen-related side effects.   An IUD can be used when breastfeeding.   IUDs are not associated with weight gain.   The copper IUD works immediately after insertion.   The hormone  IUD works right away if inserted within 7 days of your period starting. You will need to use a backup method of birth control for 7 days if the hormone IUD is inserted at any other time in your cycle.  The copper IUD does not interfere with your female hormones.   The hormone IUD can make heavy menstrual periods lighter and decrease cramping.   The hormone IUD can be used for 3 or 5 years.   The copper IUD can be used for 10 years. DISADVANTAGES OF AN INTRAUTERINE DEVICE  The hormone IUD can be associated with irregular bleeding patterns.   The copper IUD can make your menstrual flow heavier and more painful.   You may experience cramping and vaginal bleeding after insertion.  Document Released: 03/21/2004 Document Revised: 12/18/2012 Document Reviewed: 10/06/2012 [facility] Patient Information 2015 [place], [place]. This information is not intended to replace advice given to you by your health care provider. Make sure you discuss any questions you have with your health care provider. Levonorgestrel intrauterine device (IUD) What is this medicine? LEVONORGESTREL IUD (LEE voe nor jes trel) is a contraceptive (birth control) device. The device is placed inside the uterus by a healthcare professional. It is used to prevent pregnancy and can also be used to treat heavy bleeding that occurs during your period. Depending on the device, it can be used for 3 to 5 years. This medicine may be used for other purposes; ask your health care provider or pharmacist if you have questions. COMMON BRAND NAME(S): Verda Cumins What should I tell my health care provider before I take this medicine? They need to know if you have any of these  conditions: -abnormal Pap smear -cancer of the breast, uterus, or cervix -diabetes -endometritis -genital or pelvic infection now or in the past -have more than one sexual partner or your partner has more than one partner -heart disease -history of an  ectopic or tubal pregnancy -immune system problems -IUD in place -liver disease or tumor -problems with blood clots or take blood-thinners -use intravenous drugs -uterus of unusual shape -vaginal bleeding that has not been explained -an unusual or allergic reaction to levonorgestrel, other hormones, silicone, or polyethylene, medicines, foods, dyes, or preservatives -pregnant or trying to get pregnant -breast-feeding How should I use this medicine? This device is placed inside the uterus by a health care professional. Talk to your pediatrician regarding the use of this medicine in children. Special care may be needed. Overdosage: If you think you have taken too much of this medicine contact a poison control center or emergency room at once. NOTE: This medicine is only for you. Do not share this medicine with others. What if I miss a dose? This does not apply. What may interact with this medicine? Do not take this medicine with any of the following medications: -amprenavir -bosentan -fosamprenavir This medicine may also interact with the following medications: -aprepitant -barbiturate medicines for inducing sleep or treating seizures -bexarotene -griseofulvin -medicines to treat seizures like carbamazepine, ethotoin, felbamate, oxcarbazepine, phenytoin, topiramate -modafinil -pioglitazone -rifabutin -rifampin -rifapentine -some medicines to treat HIV infection like atazanavir, indinavir, lopinavir, nelfinavir, tipranavir, ritonavir -St. John's wort -warfarin This list may not describe all possible interactions. Give your health care provider a list of all the medicines, herbs, non-prescription drugs, or dietary supplements you use. Also tell them if you smoke, drink alcohol, or use illegal drugs. Some items may interact with your medicine. What should I watch for while using this medicine? Visit your doctor or health care professional for regular check ups. See your doctor if  you or your partner has sexual contact with others, becomes HIV positive, or gets a sexual transmitted disease. This product does not protect you against HIV infection (AIDS) or other sexually transmitted diseases. You can check the placement of the IUD yourself by reaching up to the top of your vagina with clean fingers to feel the threads. Do not pull on the threads. It is a good habit to check placement after each menstrual period. Call your doctor right away if you feel more of the IUD than just the threads or if you cannot feel the threads at all. The IUD may come out by itself. You may become pregnant if the device comes out. If you notice that the IUD has come out use a backup birth control method like condoms and call your health care provider. Using tampons will not change the position of the IUD and are okay to use during your period. What side effects may I notice from receiving this medicine? Side effects that you should report to your doctor or health care professional as soon as possible: -allergic reactions like skin rash, itching or hives, swelling of the face, lips, or tongue -fever, flu-like symptoms -genital sores -high blood pressure -no menstrual period for 6 weeks during use -pain, swelling, warmth in the leg -pelvic pain or tenderness -severe or sudden headache -signs of pregnancy -stomach cramping -sudden shortness of breath -trouble with balance, talking, or walking -unusual vaginal bleeding, discharge -yellowing of the eyes or skin Side effects that usually do not require medical attention (report to your doctor or health care professional if they  continue or are bothersome): -acne -breast pain -change in sex drive or performance -changes in weight -cramping, dizziness, or faintness while the device is being inserted -headache -irregular menstrual bleeding within first 3 to 6 months of use -nausea This list may not describe all possible side effects. Call your  doctor for medical advice about side effects. You may report side effects to FDA at 1-800-FDA-1088. Where should I keep my medicine? This does not apply. NOTE: This sheet is a summary. It may not cover all possible information. If you have questions about this medicine, talk to your doctor, pharmacist, or health care provider.  2015, Elsevier/Gold Standard. (2011-05-18 13:54:04)

## 2013-12-01 NOTE — Progress Notes (Signed)
Patient not ready for birth control today, she is undecided on what she wants.

## 2013-12-01 NOTE — Progress Notes (Signed)
Patient ID: Tabitha Holland, female   DOB: 11-04-1985, 28 y.o.   MRN: [id number] Subjective:some wound drainage     Tabitha Holland is a 28 y.o. female who presents for a postpartum visit. She is 6 weeks postpartum following a low cervical transverse Cesarean section. I have fully reviewed the prenatal and intrapartum course. The delivery was at 38.3 gestational weeks. Outcome: primary cesarean section, low transverse incision. Anesthesia: spinal. Postpartum course has been good. Baby's course has been good. Baby is feeding by breast. Bleeding no bleeding. Bowel function is normal. Bladder function is normal. Patient is not sexually active. Contraception method is IUD, Nexplanon and considering options. Postpartum depression screening: negative.  The following portions of the patient's history were reviewed and updated as appropriate: allergies, current medications, past family history, past medical history, past social history, past surgical history and problem list.  Review of Systems Pertinent items are noted in HPI.   Objective:    BP 160/84  Pulse 57  Ht 5\' 11"  (1.803 m)  Wt 325 lb (147.419 kg)  BMI 45.35 kg/m2  Breastfeeding? Yes  General:  alert and cooperative   Breasts:     Lungs:    Heart:     Abdomen: soft, non-tender; bowel sounds normal; no masses,  no organomegaly and incision intact except for 2 small superficial sepations with subcutaneous suture visible   Vulva:  not evaluated  Vagina: not evaluated  Cervix:     Corpus: not examined  Adnexa:  not evaluated  Rectal Exam: Not performed.        Assessment:     normal  postpartum exam. Pap smear not done at today's visit.   Plan:    1. Contraception: IUD and Nexplanon considering 2. RTC for BCM 3. Follow up as needed.   Woodroe Mode, MD 12/01/2013

## 2014-03-02 ENCOUNTER — Encounter: Payer: Self-pay | Admitting: Obstetrics & Gynecology

## 2014-04-24 ENCOUNTER — Emergency Department (HOSPITAL_COMMUNITY): Payer: BC Managed Care – PPO

## 2014-04-24 ENCOUNTER — Emergency Department (HOSPITAL_COMMUNITY)
Admission: EM | Admit: 2014-04-24 | Discharge: 2014-04-24 | Disposition: A | Discharge status: Home / Self Care | Payer: BC Managed Care – PPO | Attending: Emergency Medicine | Admitting: Emergency Medicine

## 2014-04-24 ENCOUNTER — Encounter (HOSPITAL_COMMUNITY): Payer: Self-pay | Admitting: Emergency Medicine

## 2014-04-24 DIAGNOSIS — I10 Essential (primary) hypertension: Secondary | ICD-10-CM | POA: Insufficient documentation

## 2014-04-24 DIAGNOSIS — S0592XA Unspecified injury of left eye and orbit, initial encounter: Secondary | ICD-10-CM | POA: Insufficient documentation

## 2014-04-24 DIAGNOSIS — S01401A Unspecified open wound of right cheek and temporomandibular area, initial encounter: Secondary | ICD-10-CM | POA: Insufficient documentation

## 2014-04-24 DIAGNOSIS — Y998 Other external cause status: Secondary | ICD-10-CM | POA: Insufficient documentation

## 2014-04-24 DIAGNOSIS — Z720 Tobacco use: Secondary | ICD-10-CM | POA: Insufficient documentation

## 2014-04-24 DIAGNOSIS — H539 Unspecified visual disturbance: Secondary | ICD-10-CM

## 2014-04-24 DIAGNOSIS — S40811A Abrasion of right upper arm, initial encounter: Secondary | ICD-10-CM | POA: Insufficient documentation

## 2014-04-24 DIAGNOSIS — Y9289 Other specified places as the place of occurrence of the external cause: Secondary | ICD-10-CM | POA: Insufficient documentation

## 2014-04-24 DIAGNOSIS — Z79899 Other long term (current) drug therapy: Secondary | ICD-10-CM | POA: Insufficient documentation

## 2014-04-24 DIAGNOSIS — Y9389 Activity, other specified: Secondary | ICD-10-CM | POA: Insufficient documentation

## 2014-04-24 MED ORDER — FLUORESCEIN SODIUM 1 MG OP STRP
1.0000 | ORAL_STRIP | Freq: Once | OPHTHALMIC | Status: AC
  Administered 2014-04-24: 1 via OPHTHALMIC
  Filled 2014-04-24: qty 1

## 2014-04-24 MED ORDER — TETRACAINE HCL 0.5 % OP SOLN
1.0000 [drp] | Freq: Once | OPHTHALMIC | Status: AC
  Administered 2014-04-24: 1 [drp] via OPHTHALMIC
  Filled 2014-04-24: qty 2

## 2014-04-24 MED ORDER — TETANUS-DIPHTH-ACELL PERTUSSIS 5-2.5-18.5 LF-MCG/0.5 IM SUSP: 0.5000 mL | Freq: Once | INTRAMUSCULAR | Status: DC

## 2014-04-24 MED ORDER — KETOROLAC TROMETHAMINE 30 MG/ML IJ SOLN
30.0000 mg | Freq: Once | INTRAMUSCULAR | Status: AC
  Administered 2014-04-24: 30 mg via INTRAMUSCULAR
  Filled 2014-04-24: qty 1

## 2014-04-24 MED ORDER — TRAMADOL HCL 50 MG PO TABS
50.0000 mg | ORAL_TABLET | Freq: Four times a day (QID) | ORAL | Status: DC | PRN
Start: 2014-04-24 — End: 2015-09-15

## 2014-04-24 MED ORDER — ERYTHROMYCIN 5 MG/GM OP OINT
1.0000 "application " | TOPICAL_OINTMENT | Freq: Once | OPHTHALMIC | Status: AC
  Administered 2014-04-24: 1 via OPHTHALMIC
  Filled 2014-04-24: qty 3.5
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
## 2014-04-24 NOTE — ED Notes (Signed)
Edema and redness noted to left eye.
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
## 2014-04-24 NOTE — ED Notes (Signed)
Decreased L lateral field of vision.
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
## 2014-06-03 IMAGING — CR DG ANKLE COMPLETE 3+V*L*
4 series · 4 of 4 positions shown · non-contrast
Comparison: 11/20/2011

CLINICAL DATA: Ankle inversion injury.  Medial pain.

LEFT ANKLE COMPLETE - 3+ VIEW

[x ankle ap left]
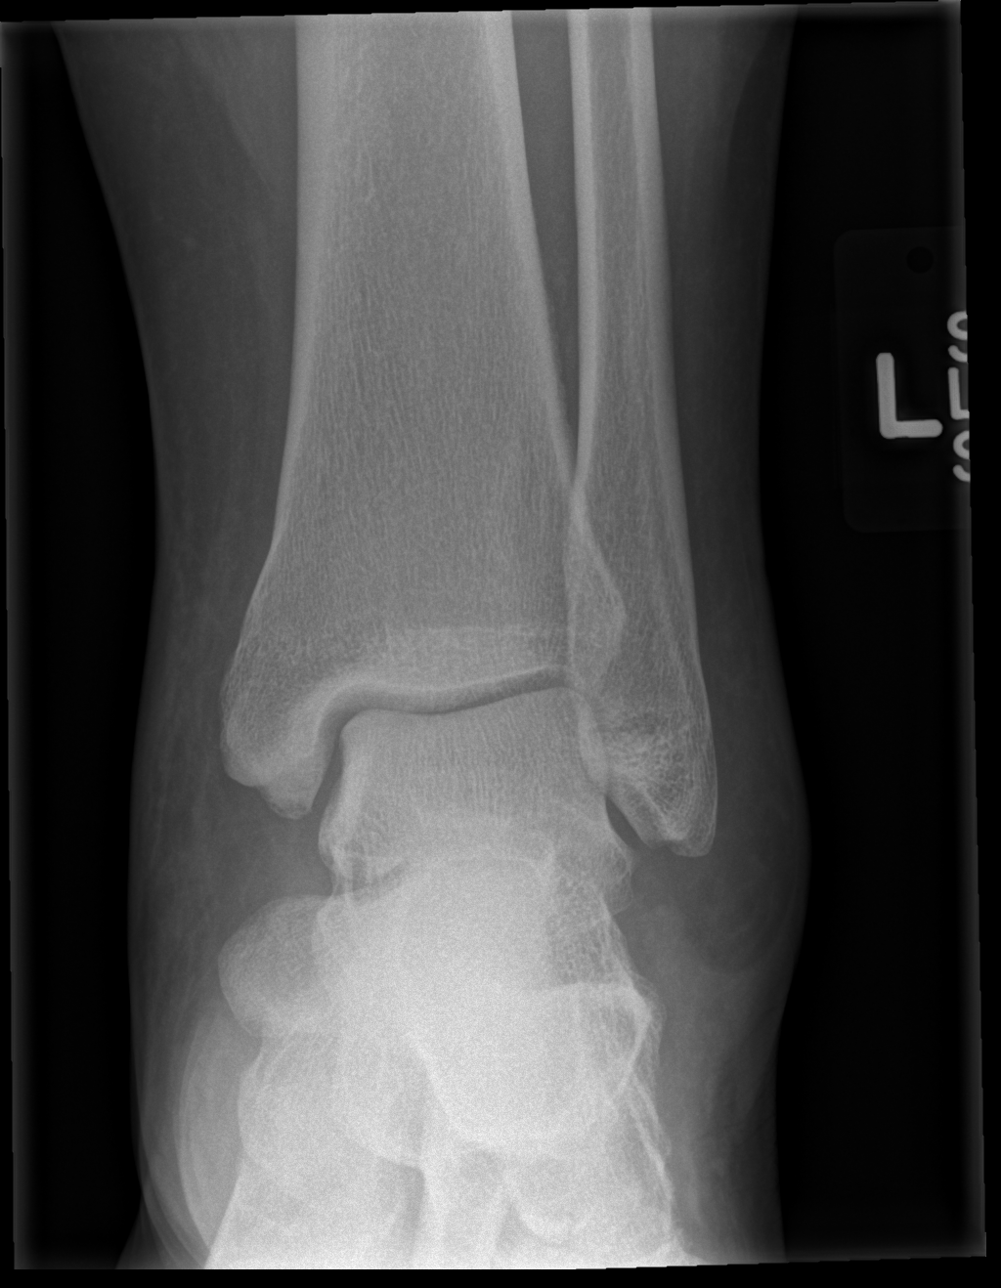

[x ankle obl left (1 of 2)]
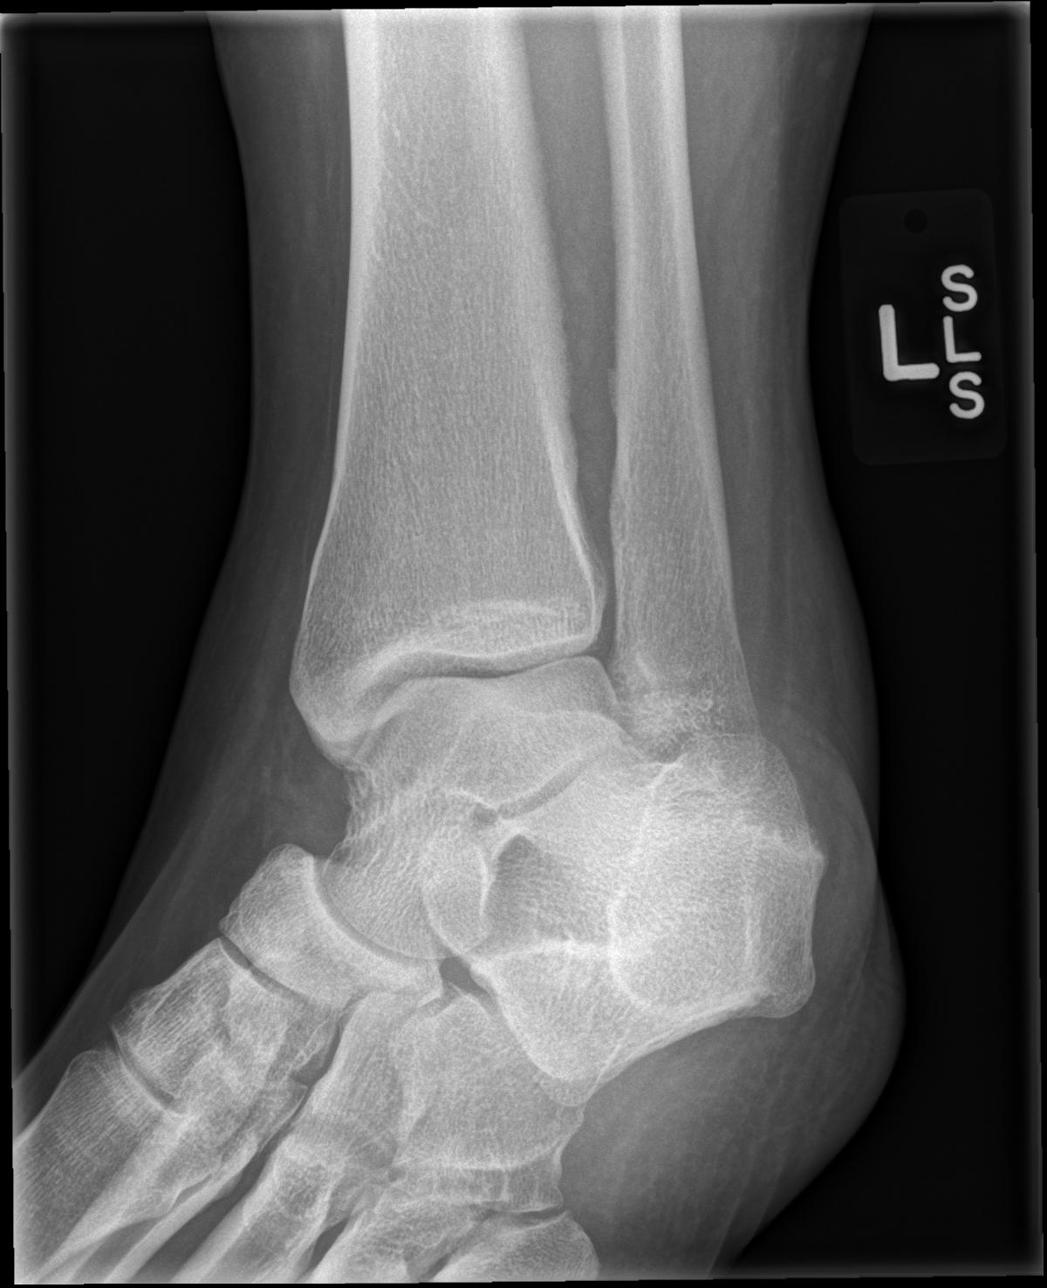

[x ankle obl left (2 of 2)]
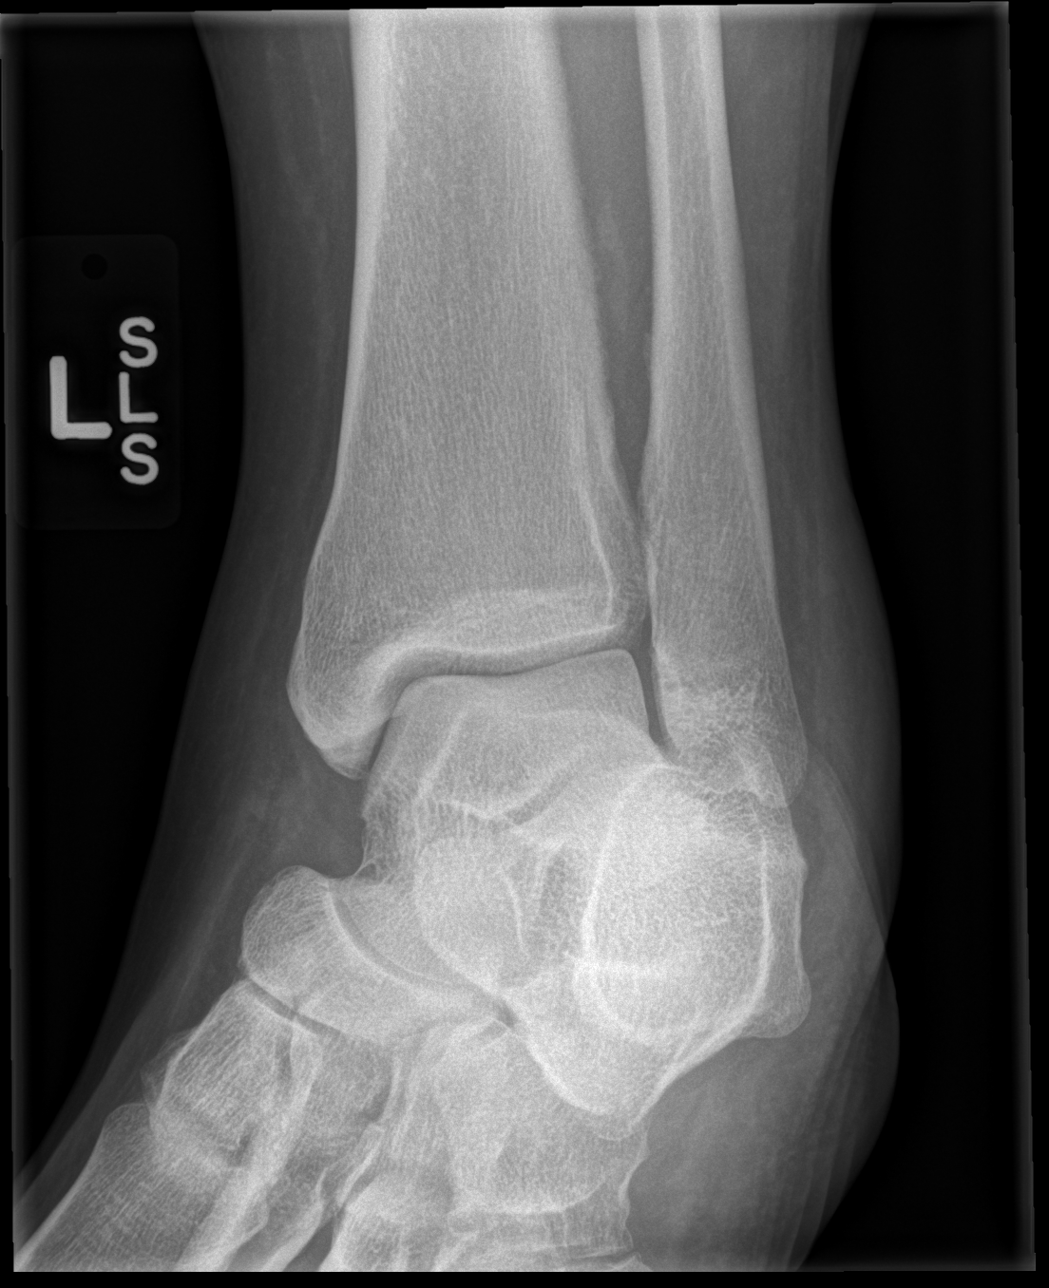

[x ankle lat left]
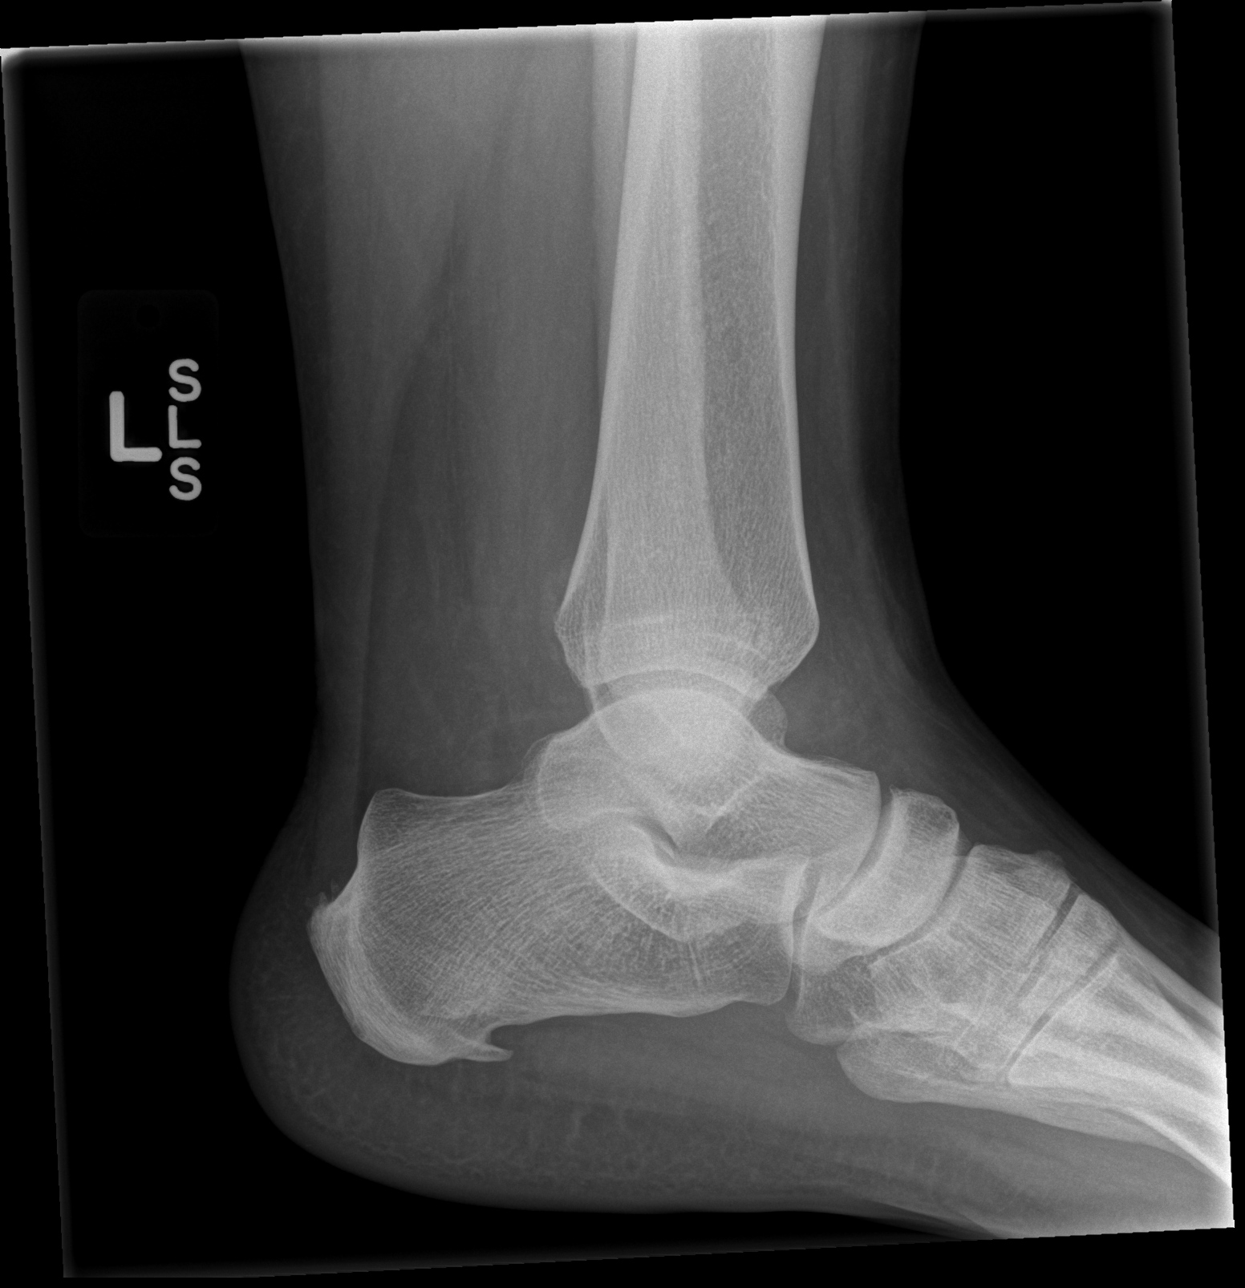

[4 of 4 positions shown; findings below may reference images not displayed]

FINDINGS: Abnormal soft tissue swelling overlies both the lateral
and medial malleoli.

No malleolar fracture is observed.  The plafond and talar dome
appear intact.

Plantar and Achilles calcaneal spurs are present.
IMPRESSION: 1.  Plantar and Achilles calcaneal spurs.
2.  Soft tissue swelling overlying both malleoli.  No underlying
fracture observed.

## 2014-08-12 ENCOUNTER — Emergency Department (HOSPITAL_COMMUNITY)
Admission: EM | Admit: 2014-08-12 | Discharge: 2014-08-12 | Disposition: A | Discharge status: Home / Self Care | Payer: 59 | Attending: Emergency Medicine | Admitting: Emergency Medicine

## 2014-08-12 ENCOUNTER — Encounter (HOSPITAL_COMMUNITY): Payer: Self-pay | Admitting: Emergency Medicine

## 2014-08-12 DIAGNOSIS — Z79899 Other long term (current) drug therapy: Secondary | ICD-10-CM | POA: Insufficient documentation

## 2014-08-12 DIAGNOSIS — R51 Headache: Secondary | ICD-10-CM | POA: Insufficient documentation

## 2014-08-12 DIAGNOSIS — Z3202 Encounter for pregnancy test, result negative: Secondary | ICD-10-CM | POA: Insufficient documentation

## 2014-08-12 DIAGNOSIS — R110 Nausea: Secondary | ICD-10-CM | POA: Diagnosis not present

## 2014-08-12 DIAGNOSIS — I10 Essential (primary) hypertension: Secondary | ICD-10-CM | POA: Insufficient documentation

## 2014-08-12 DIAGNOSIS — Z720 Tobacco use: Secondary | ICD-10-CM | POA: Insufficient documentation

## 2014-08-12 DIAGNOSIS — R519 Headache, unspecified: Secondary | ICD-10-CM

## 2014-08-12 LAB — COMPREHENSIVE METABOLIC PANEL
ALT: 30 U/L (ref 0–35)
AST: 22 U/L (ref 0–37)
Albumin: 3.7 g/dL (ref 3.5–5.2)
Alkaline Phosphatase: 89 U/L (ref 39–117)
Anion gap: 6 (ref 5–15)
BUN: 13 mg/dL (ref 6–23)
CALCIUM: 8.6 mg/dL (ref 8.4–10.5)
CO2: 26 mmol/L (ref 19–32)
CREATININE: 0.66 mg/dL (ref 0.50–1.10)
Chloride: 107 mmol/L (ref 96–112)
GFR calc non Af Amer: >90 mL/min (ref >90)
Glucose, Bld: 86 mg/dL (ref 70–99)
Potassium: 3.8 mmol/L (ref 3.5–5.1)
Sodium: 139 mmol/L (ref 135–145)
Total Bilirubin: 0.3 mg/dL (ref 0.3–1.2)
Total Protein: 7.5 g/dL (ref 6.0–8.3)

## 2014-08-12 LAB — CBC WITH DIFFERENTIAL/PLATELET
Basophils Absolute: 0 10*3/uL (ref 0.0–0.1)
Basophils Relative: 0 % (ref 0–1)
EOS PCT: 1 % (ref 0–5)
Eosinophils Absolute: 0.1 10*3/uL (ref 0.0–0.7)
HEMATOCRIT: 33.5 % — AB (ref 36.0–46.0)
Hemoglobin: 10.7 g/dL — ABNORMAL LOW (ref 12.0–15.0)
Lymphocytes Relative: 29 % (ref 12–46)
Lymphs Abs: 2.4 10*3/uL (ref 0.7–4.0)
MCH: 25.8 pg — ABNORMAL LOW (ref 26.0–34.0)
MCHC: 31.9 g/dL (ref 30.0–36.0)
MCV: 80.9 fL (ref 78.0–100.0)
MONO ABS: 0.3 10*3/uL (ref 0.1–1.0)
Monocytes Relative: 3 % (ref 3–12)
Neutro Abs: 5.5 10*3/uL (ref 1.7–7.7)
Neutrophils Relative %: 67 % (ref 43–77)
Platelets: 244 10*3/uL (ref 150–400)
RBC: 4.14 MIL/uL (ref 3.87–5.11)
RDW: 14.3 % (ref 11.5–15.5)
WBC: 8.3 10*3/uL (ref 4.0–10.5)

## 2014-08-12 LAB — URINALYSIS, ROUTINE W REFLEX MICROSCOPIC
BILIRUBIN URINE: NEGATIVE
GLUCOSE, UA: NEGATIVE mg/dL
KETONES UR: NEGATIVE mg/dL
Nitrite: NEGATIVE
Protein, ur: NEGATIVE mg/dL
SPECIFIC GRAVITY, URINE: 1.014 (ref 1.005–1.030)
Urobilinogen, UA: 0.2 mg/dL (ref 0.0–1.0)
pH: 6.5 (ref 5.0–8.0)

## 2014-08-12 LAB — URINE MICROSCOPIC-ADD ON

## 2014-08-12 LAB — LIPASE, BLOOD: Lipase: 17 U/L (ref 11–59)

## 2014-08-12 LAB — POC URINE PREG, ED: Preg Test, Ur: NEGATIVE

## 2014-08-12 MED ORDER — METOCLOPRAMIDE HCL 10 MG PO TABS: 10.0000 mg | ORAL_TABLET | Freq: Four times a day (QID) | ORAL | Status: DC

## 2014-08-12 MED ORDER — PROCHLORPERAZINE EDISYLATE 5 MG/ML IJ SOLN
10.0000 mg | Freq: Once | INTRAMUSCULAR | Status: AC
  Administered 2014-08-12: 10 mg via INTRAVENOUS
  Filled 2014-08-12: qty 2

## 2014-08-12 MED ORDER — SODIUM CHLORIDE 0.9 % IV BOLUS (SEPSIS)
1000.0000 mL | Freq: Once | INTRAVENOUS | Status: AC
  Administered 2014-08-12: 1000 mL via INTRAVENOUS

## 2014-08-12 MED ORDER — BUTALBITAL-APAP-CAFFEINE 50-325-40 MG PO TABS: 1.0000 | ORAL_TABLET | Freq: Four times a day (QID) | ORAL | Status: DC | PRN

## 2014-08-12 MED ORDER — DIPHENHYDRAMINE HCL 50 MG/ML IJ SOLN
25.0000 mg | Freq: Once | INTRAMUSCULAR | Status: AC
  Administered 2014-08-12: 25 mg via INTRAVENOUS
  Filled 2014-08-12: qty 1

## 2014-08-12 NOTE — Discharge Instructions (Signed)
Do not hesitate to return to the emergency room for any new, worsening or concerning symptoms. ° °Please obtain primary care using resource guide below. But the minute you were seen in the emergency room and that they will need to obtain records for further outpatient management. ° ° ° °Emergency Department Resource Guide °1) Find a Doctor and Pay Out of Pocket °Although you won't have to find out who is covered by your insurance plan, it is a good idea to ask around and get recommendations. You will then need to call the office and see if the doctor you have chosen will accept you as a new patient and what types of options they offer for patients who are self-pay. Some doctors offer discounts or will set up payment plans for their patients who do not have insurance, but you will need to ask so you aren't surprised when you get to your appointment. ° °2) Contact Your Local Health Department °Not all health departments have doctors that can see patients for sick visits, but many do, so it is worth a call to see if yours does. If you don't know where your local health department is, you can check in your phone book. The CDC also has a tool to help you locate your state's health department, and many state websites also have listings of all of their local health departments. ° °3) Find a Walk-in Clinic °If your illness is not likely to be very severe or complicated, you may want to try a walk in clinic. These are popping up all over the country in pharmacies, drugstores, and shopping centers. They're usually staffed by nurse practitioners or physician assistants that have been trained to treat common illnesses and complaints. They're usually fairly quick and inexpensive. However, if you have serious medical issues or chronic medical problems, these are probably not your best option. ° °No Primary Care Doctor: °- Call [facility] at  832-8000 - they can help you locate a primary care doctor that  accepts your  insurance, provides certain services, etc. °- Physician Referral Service- 1-800-533-3463 ° °Chronic Pain Problems: °Organization         Address  Phone   Notes  ° [facility]  (336) 297-2271 Patients need to be referred by their primary care doctor.  ° °Medication Assistance: °Organization         Address  Phone   Notes  °[place] [facility] [address] °[place], [place] [address] (336) 641-8030 --Must be a resident of [place] °-- Must have NO insurance coverage whatsoever (no Medicaid/ Medicare, etc.) °-- The pt. MUST have a primary care doctor that directs their care regularly and follows them in the community °  °[facility]  (866) 331-1348   °[employer]  (888) 892-1162   ° °Agencies that provide inexpensive medical care: °Organization         Address  Phone   Notes  °[facility]  (336) 832-8035   °[facility]    (336) 832-7272   °[facility] [address] °[place], [place] [address] (336) 832-4777   °[facility] [address], °[place] (336) 271-4999   °[facility]    (336) 373-0678   °[facility]    (336) 272-1050   °[facility] ° [address], [place] Phone:  (336) 832-4444, Fax:  (336) 832-4440 Hours of Operation:  9 am -   6 pm, M-F.  Also accepts Medicaid/Medicare and self-pay.  °[facility] ° [address], [place] Phone: (336) 832-3150, Fax: (336) [phone]. Hours of Operation:  8:30 am - 5:30 pm, M-F.  Also accepts Medicaid and self-pay.  °[facility] [place] [address], [place] Phone: (336) 878-6027   °[facility] [address], [place], [place] (336)723-1848, Ext. 123 Mondays & Thursdays: 7-9 AM.  First 15 patients are seen on a first come, first serve basis. °  ° °Medicaid-accepting [place] Providers: ° °Organization          Address  Phone   Notes  °[facility] [address], [place] (336) 641-2100 Also accepts self-pay patients.  °[facility] [address], [place] ° (336) 856-9996   °[facility] [address], [place] (336) 288-8857   °[facility] [address] [place] [address], [place] (336) 299-7000   °Veita Bland [address], [place]  ° (336) 373-1557 Only accepts [place] Access Medicaid patients after they have their name applied to their card.  ° °Self-Pay (no insurance) in [place]: ° °Organization         Address  Phone   Notes  °Sickle Cell Patients, [facility] [address], [place] (336) 832-1970   °[facility] [address], [place] (336) 832-4400   °[facility] ° [address], [place] (336) 992-4800   °[facility]/Dr. Osei-Bonsu ° [address] [place] [address], [place] or [address], [place] (336) [phone] Phone number for both [place] and [place] locations is the same.  °[facility] [address], [place] (336) 299-0000   °[facility] [address] [place] [address], [place] or [address] (336) 852-7530 °(336) 878-2260   °[facility] [address], [place] (336) 350-1642, phone; (336) 294-5005, fax Sees patients 1st and 3rd Saturday of every month.  Must not qualify for public or private insurance (i.e. Medicaid, Medicare, [place] Health Choice, Veterans' Benefits) • Household income should be no more than 200% of the poverty level •The clinic cannot treat you if you are pregnant or think you are pregnant • Sexually transmitted diseases are not treated at the clinic.  ° ° °Dental Care: °Organization         Address  Phone  Notes  °[facility] [address],  [place] (336) 641-6152 Accepts children up to age 21 who are enrolled in Medicaid or [place] Health Choice; pregnant women with a Medicaid card; and children who have applied for Medicaid or [place] Health Choice, but were declined, whose parents can pay a reduced fee at time of service.  °[facility] [place]  [address], [place] (336) 641-7733 Accepts children up to age 21 who are enrolled in Medicaid or [place] Health Choice; pregnant women with a Medicaid card; and children who have applied for Medicaid or [place] Health Choice, but were declined, whose parents can pay a reduced fee at time of service.  °[facility] ° [address], [place] (336) 641-4533 Patients are seen by appointment only. Walk-ins are not accepted. [facility] will see patients 18 years of age and   older. °Monday - Tuesday (8am-5pm) °Most Wednesdays (8:30-5pm) °$30 per visit, cash only  °[facility] ° [address], [place] (336) 641-4533 Patients are seen by appointment only. Walk-ins are not accepted. [facility] will see patients 18 years of age and older. °One Wednesday Evening (Monthly: Volunteer Based).  $30 per visit, cash only  °[facility]  (919) 537-3737 for adults; Children under age 4, call Graduate Pediatric Dentistry at ([phone]) [phone]. Children aged 4-14, please call ([phone]) [phone] to request a pediatric application. ° Dental services are provided in all areas of dental care including fillings, crowns and bridges, complete and partial dentures, implants, gum treatment, root canals, and extractions. Preventive care is also provided. Treatment is provided to both adults and children. °Patients are selected via a lottery and there is often a waiting list. °  °[facility] [address], °[place] ° (336) 763-8833 [email or web address] °  °[facility] [address], [place], [place]  (336)723-1848, Ext. 123 Second and Fourth Thursday of each month, opens at 6:30 AM; Clinic ends at 9 AM.  Patients are seen on a first-come first-served basis, and a limited number are seen during each clinic.  ° °[facility] ° [address], [place], [place] (336) 723-7904   Eligibility Requirements °You must have lived in [place], [place], or [place] counties for at least the last three months. °  You cannot be eligible for state or federal sponsored healthcare insurance, including Veterans Administration, Medicaid, or Medicare. °  You generally cannot be eligible for healthcare insurance through your employer.  °  How to apply: °Eligibility screenings are held every Tuesday and Wednesday afternoon from 1:00 pm until 4:00 pm. You do not need an appointment for the interview!  °[facility] [address], [place], [place] 336-631-2330   °[facility]  336-342-8273   °[facility]  336-703-3100   °[facility]  336-570-6415   ° °Behavioral Health Resources in the Community: °Intensive Outpatient Programs °Organization         Address  Phone  Notes  °[facility] [address], [place], [place] 336-878-6098   °[facility] [address], [place], [place] 336-832-9800   °[facility] [address], [place], [place] ° 336-882-2125   °[facility] [address],  °[place], [place] 1-800-853-5163 or 336-641-4981   °Substance Abuse Resources °Organization         Address  Phone  Notes  °[facility]  336-882-2125   °[facility]  336-784-9470   °[facility]  336-285-9073   °[facility]  336-845-3988   °Residential & Outpatient Substance Abuse Program  1-800-659-3381   °Psychological Services °Organization         Address  Phone  Notes  °[facility]  336- 832-9600   °[facility]  336- 378-7881    °[facility] [address], [place] 1-800-853-5163 or 336-641-4981   ° °Mobile Crisis Teams °Organization         Address  Phone  Notes  °[facility], Mobile Crisis Care Unit  1-877-626-1772   °Assertive °[facility] ° [address] [place], [place] 336-834-9664   °Sharon DeEsch [address] °[place] 336-554-5454   ° °Self-Help/Support Groups °Organization         Address    Phone             Notes  °[facility] - variety of support groups  336- 373-1402 Call for more information  °Narcotics Anonymous (NA), [facility] [address], °[place] [facility]  2 meetings at this location  ° °Residential Treatment Programs °Organization         Address  Phone  Notes  °[facility] [address],    °[place]  1-866-801-8205   °[facility] ° [address], [place], [place] 704-293-8524   °[facility] [address], [place] 336-845-3988 Admissions: 8am-3pm M-F  °[facility] [address]    °[place], [place] 336-841-1104   °[facility] [address], [place], [place] 336-379-7146   °[facility] [address]  °[place], [place] 336-285-9073   °[facility] Intensive Outpatient [address], [place], [place] 336-852-3033   °[facility] [address]  °[place], [place] 1-877-615-2722 or 336-784-9470   °[facility] [address] [place], [place] 336-227-7417 Accepts Medicaid  °[facility] [address]  °[place] 1-800-659-3381 Substance Abuse/Addiction Treatment  ° °[place] Behavioral Health Resources °Organization         Address  Phone  Notes  °[facility]  (888) 581-9988   °Julie Brannon, PhD [address] [place], [place]   (336) 349-5553 or (336) 951-0000   °[facility]   [address] °[place], [place] (336) 349-4454   °[facility] [address], [place], [place] (336) 342-8316 Insurance/Medicaid/sponsorship through [facility]  °[facility] [address]                                    [place], [place] (336) 342-8316 Therapy/tele-psych/case  °[facility] [address]  ° [place], [place] (336) 349-2233    °Dr. Arfeen  (336) 349-4544   °[facility] [place]  [employer] [place] [facility] 1) [address], [place] °2) [address], [place] °3)  [address], [place] (336) 349-3220 °(336) 342-7768 ° °(336) 342-8140   °[place] [facility] (336) 342-1394 or (336) 342-3537 (After Hours)    ° ° ° °

## 2014-08-12 NOTE — ED Provider Notes (Signed)
CSN: [id number]     Arrival date & time 08/12/14  1113 History   First MD Initiated Contact with Patient 08/12/14 1127    CC: nausea, HA   (Consider location/radiation/quality/duration/timing/severity/associated sxs/prior Treatment) HPI   Tabitha Holland is a 29 y.o. female with past medical history significant for hypertension and morbid obesity complaining of intermittent vomiting for the last 3 months. Patient states that she vomits approximately 4 times a week, she denies abdominal pain, change in bowel or bladder habits, fever, chills. She's had intermittent diarrhea with this. Says that she always had vomiting and diarrhea with her menstruation, it is worse when she menstruates but states that she has at all times over the last 3 months. Patient given Versed 9 months ago. She states that the migraine she gets for about 30 minutes at a time is bitemporal, it is 8 out of 10 at worst, 5 out of 10 right now and associated with photophobia and phonophobia.HA. Pt denies fever, rash, confusion, cervicalgia, LOC/syncope, change in vision, N/V, numbness, weakness, dysarthria, ataxia, thunderclap onset, exacerbation with exertion or valsalva, exacerbation in morning, CP, SOB, abdominal pain.   Past Medical History  Diagnosis Date  . Hypertension   . Morbid obesity    Past Surgical History  Procedure Laterality Date  . Cholecystectomy    . Wisdom tooth extraction    . Cesarean section N/A 10/28/2013    Procedure: CESAREAN SECTION;  Surgeon: Emily Filbert, MD;  Location: [facility];  Service: Obstetrics;  Laterality: N/A;   Family History  Problem Relation Age of Onset  . Hypertension Sister    History  Substance Use Topics  . Smoking status: Current Some Day Smoker -- 0.25 packs/day    Types: Cigarettes    Last Attempt to Quit: 08/08/2013  . Smokeless tobacco: Never Used  . Alcohol Use: Yes     Comment: ocassional (non during pregnancy)   OB History    Gravida Para Term Preterm AB TAB SAB  Ectopic Multiple Living   3 3 3  0 0 0 0 0 0 3     Review of Systems  10 systems reviewed and found to be negative, except as noted in the HPI.   Allergies  Codeine; Hydrocodone; and Percocet  Home Medications   Prior to Admission medications   Medication Sig Start Date End Date Taking? Authorizing Provider  BIOTIN PO Take 2 tablets by mouth daily.   Yes Historical Provider, MD  butalbital-acetaminophen-caffeine (FIORICET) 50-325-40 MG per tablet Take 1 tablet by mouth every 6 (six) hours as needed for headache. 08/12/14   Elmyra Ricks Ryshawn Sanzone, PA-C  hydrochlorothiazide (HYDRODIURIL) 25 MG tablet Take 1 tablet (25 mg total) by mouth daily. Patient not taking: Reported on 04/24/2014 10/31/13   Seabron Spates, CNM  HYDROmorphone (DILAUDID) 2 MG tablet Take 1 tablet (2 mg total) by mouth every 4 (four) hours as needed for severe pain. Patient not taking: Reported on 04/24/2014 10/31/13   Seabron Spates, CNM  ibuprofen (ADVIL,MOTRIN) 600 MG tablet Take 1 tablet (600 mg total) by mouth every 6 (six) hours as needed for mild pain. Patient not taking: Reported on 04/24/2014 10/31/13   Seabron Spates, CNM  metoCLOPramide (REGLAN) 10 MG tablet Take 1 tablet (10 mg total) by mouth every 6 (six) hours. 08/12/14   Jimia Gentles, PA-C  traMADol (ULTRAM) 50 MG tablet Take 1 tablet (50 mg total) by mouth every 6 (six) hours as needed. 04/24/14   Carmin Muskrat, MD  BP 163/103 mmHg  Pulse 58  Temp(Src) 97.6 F (36.4 C) (Oral)  Resp 15  SpO2 100%  LMP 08/10/2014  Breastfeeding? No Physical Exam  Constitutional: She is oriented to person, place, and time. She appears well-developed and well-nourished. No distress.  HENT:  Head: Normocephalic.  Mouth/Throat: Oropharynx is clear and moist.  Eyes: Conjunctivae and EOM are normal. Pupils are equal, round, and reactive to light.  Neck: Normal range of motion. Neck supple.  FROM to C-spine. Pt can touch chin to chest without discomfort. No TTP of  midline cervical spine.   Cardiovascular: Normal rate, regular rhythm and intact distal pulses.   Pulmonary/Chest: Effort normal and breath sounds normal. No stridor. No respiratory distress. She has no wheezes. She has no rales. She exhibits no tenderness.  Abdominal: Soft. Bowel sounds are normal. She exhibits no distension and no mass. There is no tenderness. There is no rebound and no guarding.  Musculoskeletal: Normal range of motion.  Neurological: She is alert and oriented to person, place, and time. No cranial nerve deficit.  II-Visual fields grossly intact. III/IV/VI-Extraocular movements intact.  Pupils reactive bilaterally. V/VII-Smile symmetric, equal eyebrow raise,  facial sensation intact VIII- Hearing grossly intact IX/X-Normal gag XI-bilateral shoulder shrug XII-midline tongue extension Motor: 5/5 bilaterally with normal tone and bulk Cerebellar: Normal finger-to-nose  and normal heel-to-shin test.   Romberg negative Ambulates with a coordinated gait   Psychiatric: She has a normal mood and affect.  Nursing note and vitals reviewed.   ED Course  Procedures (including critical care time) Labs Review Labs Reviewed  CBC WITH DIFFERENTIAL/PLATELET - Abnormal; Notable for the following:    Hemoglobin 10.7 (*)    HCT 33.5 (*)    MCH 25.8 (*)    All other components within normal limits  URINALYSIS, ROUTINE W REFLEX MICROSCOPIC - Abnormal; Notable for the following:    Color, Urine RED (*)    APPearance CLOUDY (*)    Hgb urine dipstick LARGE (*)    Leukocytes, UA TRACE (*)    All other components within normal limits  COMPREHENSIVE METABOLIC PANEL  LIPASE, BLOOD  URINE MICROSCOPIC-ADD ON  POC URINE PREG, ED    Imaging Review No results found.   EKG Interpretation None      MDM   Final diagnoses:  Nonintractable headache, unspecified chronicity pattern, unspecified headache type  Nausea    Filed Vitals:   08/12/14 1120 08/12/14 1241 08/12/14 1447   BP: 160/110 166/87 163/103  Pulse: 74 66 58  Temp: 97.6 F (36.4 C)    TempSrc: Oral    Resp: 16 17 15   SpO2: 100% 100% 100%    Medications  sodium chloride 0.9 % bolus 1,000 mL (0 mLs Intravenous Stopped 08/12/14 1440)  prochlorperazine (COMPAZINE) injection 10 mg (10 mg Intravenous Given 08/12/14 1238)  diphenhydrAMINE (BENADRYL) injection 25 mg (25 mg Intravenous Given 08/12/14 1238)    Tabitha Holland is a pleasant 29 y.o. female presenting with intermittent nausea with vomiting proximally 5 times a week for over 3 months. Worsens around her menstruation. Also with frontal headache. Neuro exam is nonfocal. Patient's blood work with no significant abnormalities. Hemoglobin the urine is from her menses. Patient is tolerating by mouth and has improvement in her headache. Given her GI referral. Will start her on Reglan. Extensive discussion of return precautions.  Evaluation does not show pathology that would require ongoing emergent intervention or inpatient treatment. Pt is hemodynamically stable and mentating appropriately. Discussed findings and plan with  patient/guardian, who agrees with care plan. All questions answered. Return precautions discussed and outpatient follow up given.   Discharge Medication List as of 08/12/2014  2:49 PM    START taking these medications   Details  butalbital-acetaminophen-caffeine (FIORICET) 50-325-40 MG per tablet Take 1 tablet by mouth every 6 (six) hours as needed for headache., Starting 08/12/2014, Until Discontinued, Print    metoCLOPramide (REGLAN) 10 MG tablet Take 1 tablet (10 mg total) by mouth every 6 (six) hours., Starting 08/12/2014, Until Discontinued, Print             Monico Blitz, PA-C 08/13/14 6599  Milton Ferguson, MD 08/13/14 [phone]

## 2014-08-12 NOTE — ED Notes (Signed)
PA at bedside.

## 2014-08-12 NOTE — ED Notes (Signed)
Pt c/o intermittent nausea x 3.5 months, worsening around menstruation. Pt also c/o intermittent migraines x 1.5 months, up to 3 times per day, and chronic body pains, and diarrhea. Pt states she has had difficulty eating. Denies emesis today.

## 2015-03-12 ENCOUNTER — Encounter (HOSPITAL_COMMUNITY): Payer: Self-pay | Admitting: Emergency Medicine

## 2015-03-12 ENCOUNTER — Emergency Department (HOSPITAL_COMMUNITY)
Admission: EM | Admit: 2015-03-12 | Discharge: 2015-03-13 | Disposition: A | Discharge status: Home / Self Care | Payer: 59 | Attending: Emergency Medicine | Admitting: Emergency Medicine

## 2015-03-12 DIAGNOSIS — R51 Headache: Secondary | ICD-10-CM

## 2015-03-12 DIAGNOSIS — Z720 Tobacco use: Secondary | ICD-10-CM | POA: Insufficient documentation

## 2015-03-12 DIAGNOSIS — R519 Headache, unspecified: Secondary | ICD-10-CM

## 2015-03-12 DIAGNOSIS — G43909 Migraine, unspecified, not intractable, without status migrainosus: Secondary | ICD-10-CM | POA: Insufficient documentation

## 2015-03-12 DIAGNOSIS — I10 Essential (primary) hypertension: Secondary | ICD-10-CM | POA: Insufficient documentation

## 2015-03-12 MED ORDER — METOCLOPRAMIDE HCL 5 MG/ML IJ SOLN
10.0000 mg | Freq: Once | INTRAMUSCULAR | Status: AC
  Administered 2015-03-12: 10 mg via INTRAVENOUS
  Filled 2015-03-12: qty 2

## 2015-03-12 MED ORDER — DEXAMETHASONE SODIUM PHOSPHATE 10 MG/ML IJ SOLN
10.0000 mg | Freq: Once | INTRAMUSCULAR | Status: AC
  Administered 2015-03-12: 10 mg via INTRAVENOUS
  Filled 2015-03-12: qty 1

## 2015-03-12 MED ORDER — KETOROLAC TROMETHAMINE 30 MG/ML IJ SOLN
30.0000 mg | Freq: Once | INTRAMUSCULAR | Status: AC
  Administered 2015-03-12: 30 mg via INTRAVENOUS
  Filled 2015-03-12: qty 1

## 2015-03-12 MED ORDER — DIPHENHYDRAMINE HCL 50 MG/ML IJ SOLN
25.0000 mg | Freq: Once | INTRAMUSCULAR | Status: AC
  Administered 2015-03-12: 25 mg via INTRAVENOUS
  Filled 2015-03-12: qty 1

## 2015-03-12 NOTE — ED Notes (Signed)
Pt states migraine since Sunday.  Hx of same.  States that she has been having the pain that radiates down back.  Pain makes her nauseated.  States that she is having bruises that she cannot account for. Pt does not have nuchal rigidity.

## 2015-03-12 NOTE — ED Notes (Signed)
Collected blood during IV insertion; samples sent to lab to save tubes.

## 2015-03-13 MED ORDER — IBUPROFEN 600 MG PO TABS: 600.0000 mg | ORAL_TABLET | Freq: Four times a day (QID) | ORAL | Status: DC | PRN

## 2015-03-13 NOTE — ED Provider Notes (Signed)
CSN: [id number]     Arrival date & time 03/12/15  1720 History   First MD Initiated Contact with Patient 03/12/15 2145     Chief Complaint  Patient presents with  . Migraine     (Consider location/radiation/quality/duration/timing/severity/associated sxs/prior Treatment) HPI Comments:  Tabitha Holland is a 29 y.o. female who complains of migraine headache for 5 day(s). She has a well established history of recurrent migraines.  Description of pain: throbbing pain, sharp pain, global. Associated symptoms: intermittent blurry vision x 2 episodes lasting few seconds. Patient has already taken excedrin for this headache without relief.  There are no associated abnormal neurological symptoms such as TIA's, loss of balance, loss of vision or speech, numbness or weakness on review. Past neurological history: negative for stroke, MS, epilepsy, or brain tumor.   OBJECTIVE:  Patient appears in pain, preferring to lie in a darkened room. Her vitals are normal. Alert and oriented x 3.  Ears and throat normal. Neck fully supple without nodes. Sinuses non tender. Cranial nerves are normal.  Fundi are normal with sharp disc margins, no papilledema, hemorrhages or exudates noted. DTR's normal and symmetric. Babinski sign absent.  Mental status normal. Cerebellar function normal.      Patient is a 29 y.o. female presenting with migraines. The history is provided by the patient.  Migraine Associated symptoms include headaches. Pertinent negatives include no chest pain, no abdominal pain and no shortness of breath.    Past Medical History  Diagnosis Date  . Hypertension   . Morbid obesity [facility])    Past Surgical History  Procedure Laterality Date  . Cholecystectomy    . Wisdom tooth extraction    . Cesarean section N/A 10/28/2013    Procedure: CESAREAN SECTION;  Surgeon: Emily Filbert, MD;  Location: [facility];  Service: Obstetrics;  Laterality: N/A;   Family History  Problem Relation Age of  Onset  . Hypertension Sister    Social History  Substance Use Topics  . Smoking status: Current Some Day Smoker -- 0.25 packs/day    Types: Cigarettes    Last Attempt to Quit: 08/08/2013  . Smokeless tobacco: Never Used  . Alcohol Use: Yes     Comment: ocassional (non during pregnancy)   OB History    Gravida Para Term Preterm AB TAB SAB Ectopic Multiple Living   3 3 3  0 0 0 0 0 0 3     Review of Systems  Constitutional: Positive for activity change.  Eyes: Positive for photophobia and visual disturbance.  Respiratory: Negative for shortness of breath.   Cardiovascular: Negative for chest pain.  Gastrointestinal: Negative for nausea, vomiting and abdominal pain.  Genitourinary: Negative for dysuria.  Musculoskeletal: Negative for neck pain.  Neurological: Positive for headaches. Negative for dizziness, tremors, seizures, syncope, facial asymmetry, speech difficulty, weakness and light-headedness.  All other systems reviewed and are negative.     Allergies  Codeine; Hydrocodone; and Percocet  Home Medications   Prior to Admission medications   Medication Sig Start Date End Date Taking? Authorizing Provider  acetaminophen (TYLENOL) 325 MG tablet Take 650 mg by mouth every 6 (six) hours as needed for moderate pain.   Yes Historical Provider, MD  aspirin-acetaminophen-caffeine (EXCEDRIN MIGRAINE) 5611753351 MG tablet Take 1 tablet by mouth every 6 (six) hours as needed for headache.   Yes Historical Provider, MD  hydrochlorothiazide (HYDRODIURIL) 25 MG tablet Take 1 tablet (25 mg total) by mouth daily. Patient not taking: Reported on 04/24/2014 10/31/13  Seabron Spates, CNM  HYDROmorphone (DILAUDID) 2 MG tablet Take 1 tablet (2 mg total) by mouth every 4 (four) hours as needed for severe pain. Patient not taking: Reported on 04/24/2014 10/31/13   Seabron Spates, CNM  ibuprofen (ADVIL,MOTRIN) 600 MG tablet Take 1 tablet (600 mg total) by mouth every 6 (six) hours as needed.  03/13/15   Varney Biles, MD  metoCLOPramide (REGLAN) 10 MG tablet Take 1 tablet (10 mg total) by mouth every 6 (six) hours. Patient not taking: Reported on 03/12/2015 08/12/14   Elmyra Ricks Pisciotta, PA-C  traMADol (ULTRAM) 50 MG tablet Take 1 tablet (50 mg total) by mouth every 6 (six) hours as needed. Patient not taking: Reported on 03/12/2015 04/24/14   Carmin Muskrat, MD   BP 120/94 mmHg  Pulse 101  Temp(Src) 98.2 F (36.8 C) (Oral)  Resp 16  SpO2 100% Physical Exam  Constitutional: She is oriented to person, place, and time. She appears well-developed and well-nourished.  HENT:  Head: Normocephalic and atraumatic.  Eyes: EOM are normal. Pupils are equal, round, and reactive to light.  Neck: Neck supple.  Cardiovascular: Normal rate, regular rhythm and normal heart sounds.   No murmur heard. Pulmonary/Chest: Effort normal. No respiratory distress.  Abdominal: Soft. She exhibits no distension. There is no tenderness. There is no rebound and no guarding.  Neurological: She is alert and oriented to person, place, and time. No cranial nerve deficit. Coordination normal.  Cerebellar exam is normal (finger to nose) Sensory exam normal for bilateral upper and lower extremities - and patient is able to discriminate between sharp and dull. Motor exam is 4+/5   Skin: Skin is warm and dry.    ED Course  Procedures (including critical care time) Labs Review Labs Reviewed - No data to display  Imaging Review No results found. I have personally reviewed and evaluated these images and lab results as part of my medical decision-making.   EKG Interpretation None      @12 :15 Upon reassessment, patient reports that the headache has resolved. She continued to have no neurologic complains. Strict return precautions discussed, pt will return to the ER if there is visual complains, seizures, altered mental status, loss of consciousness, dizziness, new focal weakness, or numbness.    MDM    Final diagnoses:  Bad headache  Acute nonintractable headache, unspecified headache type      DDX includes: Primary headaches - including migrainous headaches, cluster headaches, tension headaches. ICH Carotid dissection thrombosis Tumor Psuedotumor cerebri Vascular headaches AV malformation Brain aneurysm Muscular headaches  ASSESSMENT:  Migraine headache or other complex headache  Headache not positional and she has had similar headache in the past. No objective signs of elecated ICP - vision is normal, headache not worse when supine. Pt has no risk for thrombosis. No clinical concerns for infection, bleed.   PLAN:  Treatment today - with toradol, reglan, decadron. If not better, will consider CT.      Varney Biles, MD 03/13/15 [phone]

## 2015-03-13 NOTE — Discharge Instructions (Signed)
We saw you in the ER for headaches. All the labs and imaging are normal. We are not sure what is causing your headaches, however, there appears to be no evidence of infection, bleeds or tumors based on our exam and results.  Please take motrin round the clock for the next 6 hours, and take other meds prescribed only for break through pain. See your doctor if the pain persists, as you might need better medications or a specialist.  RESOURCE GUIDE  Chronic Pain Problems: Contact [facility]  [phone] Patients need to be referred by their primary care doctor.  Insufficient Money for Medicine: Contact [employer]:  call "211."   No Primary Care Doctor: - Call Health Connect  [phone] - can help you locate a primary care doctor that  accepts your insurance, provides certain services, etc. - Physician Referral Service- [phone]  Agencies that provide inexpensive medical care: - [facility]  [facility]  [phone] - [facility]  [phone] - [facility]  [phone] - Planned [facility]  [facility]  [phone]  [place] Providers: - [facility]- [address], [address]  [phone], Mon-Fri 9am-7pm, Sat 9am-1pm - [facility]- [place], [address] [place]  [place], [address] [place]  [place]- [address]  [place], [address], [phone]  Only accepts [place] patients after they have their name  applied to their card  Self Pay (no insurance) in [place]: - Sickle Cell Patients: Dr Kevan Ny, [facility]  [place], [facility]- [place]  [name]- [id number] [place], [facility]- see information above (Speak to [employer] if you do not have insurance)       -  [facility]- [place],  [place], [place]  Dr Vista Lawman-  [address], [place], [place], [place]       -  [facility] - [address] [phone]       -  [facility]- [address], [place], also [address] [id number]       -    [facility]- [place], [place], 1st & 3rd Saturday        every month, 10am-1pm  [facility] [address] [place], [place] [address] [phone]  [facility] [place]. [place], [place] [address] [phone]  1) Find a Doctor and Pay Out of Pocket Although you won't have to find out who is covered by your insurance plan, it is a good idea to ask around and get recommendations. You will then need to call the office and see if the doctor you have chosen will accept you as a new patient and what types of options they offer for patients who are self-pay. Some doctors offer discounts or will set up payment plans  for their patients who do not have insurance, but you will need to ask so you aren't surprised when you get to your appointment.  2) Contact Your Local Health Department Not all health departments have doctors that can see patients for sick visits, but many do, so it is worth a call to see if yours does. If you don't know where your local health department is, you can check in your phone book. The CDC also has a tool to help you locate your state's health department, and many state websites also have listings of all of their local health departments.  3) Find a [facility] If your illness is not likely to be very severe or complicated, you may want to try a walk in clinic. These are popping up all over the country in pharmacies,  drugstores, and shopping centers. They're usually staffed by nurse practitioners or physician assistants that have been trained to treat common illnesses and complaints. They're usually fairly quick and inexpensive. However, if you have serious medical issues or chronic medical problems, these are probably not your best option  STD Testing - [place], [facility], [address], [place], phone [phone] or [phone].  Monday - Friday, call for an appointment. - [place], [facility], [address], [place], phone [phone] or [phone].  Monday - Friday, call for an appointment.  Abuse/Neglect: - [place] [phone] - [place] [phone] (After Hours)  Emergency Shelter:  [facility] [phone]  Maternity Homes: - [facility] [phone] - [place] [phone]  MRSA Hotline #:   (413)739-7347  Dental Assistance If unable to pay or uninsured, contact:  [facility]. to become qualified for the adult dental clinic.  Patients with Medicaid: [facility] [phone] [address], [place] [address], [phone]  If unable to pay, or uninsured, contact [facility] [phone] in [place], [place] in [facility]) to become qualified for the adult dental clinic  [facility] [address] [place], [place] [address] [phone] [email or web address]  Other [place]: - [facility] [place], [place], [place], [address], [place], 2nd and 4th Thursday of the month at 6:30am.  10 clients each day by appointment, can sometimes see walk-in patients if someone does not show for an appointment. [facility]- [address] [place]ard Danker [place],  [place], [address], [place], [place], [place], [address], [place] Department- [place] [phone]           Migraine Headache A migraine headache is an intense, throbbing pain on one or both sides of your head. A migraine can last for 30 minutes to several hours. CAUSES  The exact cause of a migraine headache is not always known. However, a migraine may be caused when nerves in the brain become irritated and release chemicals that cause inflammation. This causes pain. Certain things may also trigger migraines, such as:  Alcohol.  Smoking.  Stress.  Menstruation.  Aged cheeses.  Foods or drinks that contain nitrates, glutamate, aspartame, or tyramine.  Lack of sleep.  Chocolate.  Caffeine.  Hunger.  Physical exertion.  Fatigue.  Medicines used to treat chest pain (  nitroglycerine), birth control pills, estrogen, and some blood pressure medicines. SIGNS AND SYMPTOMS  Pain on one or both sides of your head.  Pulsating or throbbing pain.  Severe pain that prevents daily activities.  Pain that is aggravated by any physical activity.  Nausea, vomiting, or both.  Dizziness.  Pain with exposure to bright lights, loud noises, or activity.  General sensitivity to bright lights, loud noises, or smells. Before you get a migraine, you may get warning signs that a migraine is coming (aura). An aura may include:  Seeing flashing lights.  Seeing bright spots, halos, or zigzag lines.  Having tunnel vision or blurred vision.  Having feelings of numbness or tingling.  Having trouble talking.  Having muscle weakness. DIAGNOSIS  A migraine headache is often diagnosed based on:  Symptoms.  Physical exam.  A CT scan or MRI of your head. These imaging tests cannot diagnose migraines, but they can help rule out other  causes of headaches. TREATMENT Medicines may be given for pain and nausea. Medicines can also be given to help prevent recurrent migraines.  HOME CARE INSTRUCTIONS  Only take over-the-counter or prescription medicines for pain or discomfort as directed by your health care provider. The use of long-term narcotics is not recommended.  Lie down in a dark, quiet room when you have a migraine.  Keep a journal to find out what may trigger your migraine headaches. For example, write down:  What you eat and drink.  How much sleep you get.  Any change to your diet or medicines.  Limit alcohol consumption.  Quit smoking if you smoke.  Get 7-9 hours of sleep, or as recommended by your health care provider.  Limit stress.  Keep lights dim if bright lights bother you and make your migraines worse. SEEK IMMEDIATE MEDICAL CARE IF:   Your migraine becomes severe.  You have a fever.  You have a stiff neck.  You have vision loss.  You have muscular weakness or loss of muscle control.  You start losing your balance or have trouble walking.  You feel faint or pass out.  You have severe symptoms that are different from your first symptoms. MAKE SURE YOU:   Understand these instructions.  Will watch your condition.  Will get help right away if you are not doing well or get worse.   This information is not intended to replace advice given to you by your health care provider. Make sure you discuss any questions you have with your health care provider.   Document Released: 04/17/2005 Document Revised: 05/08/2014 Document Reviewed: 12/23/2012 Elsevier Interactive Patient Education 2016 [place] Headache Without Cause A headache is pain or discomfort felt around the head or neck area. The specific cause of a headache may not be found. There are many causes and types of headaches. A few common ones are:  Tension headaches.  Migraine headaches.  Cluster  headaches.  Chronic daily headaches. HOME CARE INSTRUCTIONS  Watch your condition for any changes. Take these steps to help with your condition: Managing Pain  Take over-the-counter and prescription medicines only as told by your health care provider.  Lie down in a dark, quiet room when you have a headache.  If directed, apply ice to the head and neck area:  Put ice in a plastic bag.  Place a towel between your skin and the bag.  Leave the ice on for 20 minutes, 2-3 times per day.  Use a heating pad or hot shower  to apply heat to the head and neck area as told by your health care provider.  Keep lights dim if bright lights bother you or make your headaches worse. Eating and Drinking  Eat meals on a regular schedule.  Limit alcohol use.  Decrease the amount of caffeine you drink, or stop drinking caffeine. General Instructions  Keep all follow-up visits as told by your health care provider. This is important.  Keep a headache journal to help find out what may trigger your headaches. For example, write down:  What you eat and drink.  How much sleep you get.  Any change to your diet or medicines.  Try massage or other relaxation techniques.  Limit stress.  Sit up straight, and do not tense your muscles.  Do not use tobacco products, including cigarettes, chewing tobacco, or e-cigarettes. If you need help quitting, ask your health care provider.  Exercise regularly as told by your health care provider.  Sleep on a regular schedule. Get 7-9 hours of sleep, or the amount recommended by your health care provider. SEEK MEDICAL CARE IF:   Your symptoms are not helped by medicine.  You have a headache that is different from the usual headache.  You have nausea or you vomit.  You have a fever. SEEK IMMEDIATE MEDICAL CARE IF:   Your headache becomes severe.  You have repeated vomiting.  You have a stiff neck.  You have a loss of vision.  You have problems  with speech.  You have pain in the eye or ear.  You have muscular weakness or loss of muscle control.  You lose your balance or have trouble walking.  You feel faint or pass out.  You have confusion.   This information is not intended to replace advice given to you by your health care provider. Make sure you discuss any questions you have with your health care provider.   Document Released: 04/17/2005 Document Revised: 01/06/2015 Document Reviewed: 08/10/2014 Elsevier Interactive Patient Education [employer].

## 2015-09-15 ENCOUNTER — Inpatient Hospital Stay (HOSPITAL_COMMUNITY): Payer: Self-pay | Admitting: Anesthesiology

## 2015-09-15 ENCOUNTER — Encounter (HOSPITAL_COMMUNITY)
Admission: AD | Disposition: A | Discharge status: Home / Self Care | Payer: Self-pay | Source: Ambulatory Visit | Attending: Obstetrics and Gynecology

## 2015-09-15 ENCOUNTER — Encounter (HOSPITAL_COMMUNITY): Payer: Self-pay | Admitting: *Deleted

## 2015-09-15 ENCOUNTER — Inpatient Hospital Stay (HOSPITAL_COMMUNITY): Payer: Self-pay

## 2015-09-15 ENCOUNTER — Inpatient Hospital Stay (HOSPITAL_COMMUNITY)
Admission: AD | Admit: 2015-09-15 | Discharge: 2015-09-17 | DRG: 765 | Disposition: A | Discharge status: Home / Self Care | Payer: Self-pay | Source: Ambulatory Visit | Attending: Obstetrics and Gynecology | Admitting: Obstetrics and Gynecology

## 2015-09-15 DIAGNOSIS — O1002 Pre-existing essential hypertension complicating childbirth: Principal | ICD-10-CM | POA: Diagnosis present

## 2015-09-15 DIAGNOSIS — R52 Pain, unspecified: Secondary | ICD-10-CM

## 2015-09-15 DIAGNOSIS — Z3A38 38 weeks gestation of pregnancy: Secondary | ICD-10-CM

## 2015-09-15 DIAGNOSIS — Z8249 Family history of ischemic heart disease and other diseases of the circulatory system: Secondary | ICD-10-CM

## 2015-09-15 DIAGNOSIS — O99334 Smoking (tobacco) complicating childbirth: Secondary | ICD-10-CM | POA: Diagnosis present

## 2015-09-15 DIAGNOSIS — O99332 Smoking (tobacco) complicating pregnancy, second trimester: Secondary | ICD-10-CM | POA: Diagnosis present

## 2015-09-15 DIAGNOSIS — O4103X0 Oligohydramnios, third trimester, not applicable or unspecified: Secondary | ICD-10-CM

## 2015-09-15 DIAGNOSIS — Z98891 History of uterine scar from previous surgery: Secondary | ICD-10-CM

## 2015-09-15 DIAGNOSIS — O9989 Other specified diseases and conditions complicating pregnancy, childbirth and the puerperium: Secondary | ICD-10-CM

## 2015-09-15 DIAGNOSIS — O10913 Unspecified pre-existing hypertension complicating pregnancy, third trimester: Secondary | ICD-10-CM | POA: Diagnosis present

## 2015-09-15 DIAGNOSIS — Z720 Tobacco use: Secondary | ICD-10-CM | POA: Diagnosis present

## 2015-09-15 DIAGNOSIS — O34211 Maternal care for low transverse scar from previous cesarean delivery: Secondary | ICD-10-CM

## 2015-09-15 DIAGNOSIS — O99210 Obesity complicating pregnancy, unspecified trimester: Secondary | ICD-10-CM | POA: Diagnosis present

## 2015-09-15 DIAGNOSIS — O99214 Obesity complicating childbirth: Secondary | ICD-10-CM

## 2015-09-15 DIAGNOSIS — F17210 Nicotine dependence, cigarettes, uncomplicated: Secondary | ICD-10-CM | POA: Diagnosis present

## 2015-09-15 DIAGNOSIS — Z6841 Body Mass Index (BMI) 40.0 and over, adult: Secondary | ICD-10-CM

## 2015-09-15 LAB — COMPREHENSIVE METABOLIC PANEL
ALK PHOS: 178 U/L — AB (ref 38–126)
ALT: 12 U/L — AB (ref 14–54)
AST: 15 U/L (ref 15–41)
Albumin: 2.8 g/dL — ABNORMAL LOW (ref 3.5–5.0)
Anion gap: 8 (ref 5–15)
BUN: 6 mg/dL (ref 6–20)
CALCIUM: 8.9 mg/dL (ref 8.9–10.3)
CO2: 21 mmol/L — AB (ref 22–32)
CREATININE: 0.5 mg/dL (ref 0.44–1.00)
Chloride: 108 mmol/L (ref 101–111)
GFR calc Af Amer: >60 mL/min (ref >60)
Glucose, Bld: 89 mg/dL (ref 65–99)
Potassium: 3.8 mmol/L (ref 3.5–5.1)
SODIUM: 137 mmol/L (ref 135–145)
Total Bilirubin: 0.5 mg/dL (ref 0.3–1.2)
Total Protein: 6.6 g/dL (ref 6.5–8.1)

## 2015-09-15 LAB — PROTIME-INR
INR: 1 (ref 0.00–1.49)
PROTHROMBIN TIME: 13.4 s (ref 11.6–15.2)

## 2015-09-15 LAB — TYPE AND SCREEN
ABO/RH(D): O POS
Antibody Screen: NEGATIVE

## 2015-09-15 LAB — CBC
HEMATOCRIT: 29.8 % — AB (ref 36.0–46.0)
Hemoglobin: 9.4 g/dL — ABNORMAL LOW (ref 12.0–15.0)
MCH: 25 pg — ABNORMAL LOW (ref 26.0–34.0)
MCHC: 31.5 g/dL (ref 30.0–36.0)
MCV: 79.3 fL (ref 78.0–100.0)
Platelets: 256 10*3/uL (ref 150–400)
RBC: 3.76 MIL/uL — ABNORMAL LOW (ref 3.87–5.11)
RDW: 15.1 % (ref 11.5–15.5)
WBC: 10.4 10*3/uL (ref 4.0–10.5)

## 2015-09-15 LAB — PROTEIN / CREATININE RATIO, URINE
Creatinine, Urine: 149 mg/dL
PROTEIN CREATININE RATIO: 0.17 mg/mg{creat} — AB (ref 0.00–0.15)
Total Protein, Urine: 26 mg/dL

## 2015-09-15 LAB — RAPID HIV SCREEN (HIV 1/2 AB+AG)
HIV 1/2 ANTIBODIES: NONREACTIVE
HIV-1 P24 ANTIGEN - HIV24: NONREACTIVE

## 2015-09-15 LAB — HEPATITIS B SURFACE ANTIGEN: Hepatitis B Surface Ag: NEGATIVE

## 2015-09-15 SURGERY — Surgical Case
Anesthesia: Spinal

## 2015-09-15 MED ORDER — ZOLPIDEM TARTRATE 5 MG PO TABS: 5.0000 mg | ORAL_TABLET | Freq: Every evening | ORAL | Status: DC | PRN

## 2015-09-15 MED ORDER — OXYTOCIN 10 UNIT/ML IJ SOLN
40.0000 [IU] | INTRAVENOUS | Status: DC | PRN
  Administered 2015-09-15: 40 [IU] via INTRAVENOUS

## 2015-09-15 MED ORDER — SODIUM CHLORIDE 0.9% FLUSH: 3.0000 mL | INTRAVENOUS | Status: DC | PRN

## 2015-09-15 MED ORDER — SCOPOLAMINE 1 MG/3DAYS TD PT72
1.0000 | MEDICATED_PATCH | Freq: Once | TRANSDERMAL | Status: DC
  Filled 2015-09-15: qty 1

## 2015-09-15 MED ORDER — KETOROLAC TROMETHAMINE 30 MG/ML IJ SOLN
INTRAMUSCULAR | Status: AC
  Filled 2015-09-15: qty 1

## 2015-09-15 MED ORDER — DIBUCAINE 1 % RE OINT: 1.0000 "application " | TOPICAL_OINTMENT | RECTAL | Status: DC | PRN

## 2015-09-15 MED ORDER — ONDANSETRON HCL 4 MG/2ML IJ SOLN
INTRAMUSCULAR | Status: DC | PRN
  Administered 2015-09-15: 4 mg via INTRAVENOUS

## 2015-09-15 MED ORDER — ACETAMINOPHEN 325 MG PO TABS
650.0000 mg | ORAL_TABLET | ORAL | Status: DC | PRN
  Administered 2015-09-16: 650 mg via ORAL
  Filled 2015-09-15: qty 2

## 2015-09-15 MED ORDER — NALBUPHINE HCL 10 MG/ML IJ SOLN: 5.0000 mg | Freq: Once | INTRAMUSCULAR | Status: DC | PRN

## 2015-09-15 MED ORDER — FAMOTIDINE IN NACL 20-0.9 MG/50ML-% IV SOLN
20.0000 mg | Freq: Once | INTRAVENOUS | Status: AC
  Administered 2015-09-15: 20 mg via INTRAVENOUS
  Filled 2015-09-15: qty 50

## 2015-09-15 MED ORDER — MORPHINE SULFATE (PF) 0.5 MG/ML IJ SOLN
INTRAMUSCULAR | Status: DC | PRN
  Administered 2015-09-15: .2 mg via EPIDURAL

## 2015-09-15 MED ORDER — LACTATED RINGERS IV SOLN
INTRAVENOUS | Status: DC
  Administered 2015-09-15 (×4): via INTRAVENOUS

## 2015-09-15 MED ORDER — MORPHINE SULFATE (PF) 0.5 MG/ML IJ SOLN
INTRAMUSCULAR | Status: AC
  Filled 2015-09-15: qty 10

## 2015-09-15 MED ORDER — NALBUPHINE HCL 10 MG/ML IJ SOLN
5.0000 mg | INTRAMUSCULAR | Status: DC | PRN
  Administered 2015-09-16: 5 mg via INTRAVENOUS
  Filled 2015-09-15: qty 1

## 2015-09-15 MED ORDER — LACTATED RINGERS IV SOLN
INTRAVENOUS | Status: DC | PRN
  Administered 2015-09-15: 17:00:00 via INTRAVENOUS

## 2015-09-15 MED ORDER — NALBUPHINE HCL 10 MG/ML IJ SOLN: 5.0000 mg | INTRAMUSCULAR | Status: DC | PRN

## 2015-09-15 MED ORDER — SIMETHICONE 80 MG PO CHEW
80.0000 mg | CHEWABLE_TABLET | Freq: Three times a day (TID) | ORAL | Status: DC
  Administered 2015-09-16 (×3): 80 mg via ORAL
  Filled 2015-09-15 (×3): qty 1

## 2015-09-15 MED ORDER — PHENYLEPHRINE 8 MG IN D5W 100 ML (0.08MG/ML) PREMIX OPTIME
INJECTION | INTRAVENOUS | Status: DC | PRN
  Administered 2015-09-15: 20 ug/min via INTRAVENOUS

## 2015-09-15 MED ORDER — NALOXONE HCL 0.4 MG/ML IJ SOLN: 0.4000 mg | INTRAMUSCULAR | Status: DC | PRN

## 2015-09-15 MED ORDER — PHENYLEPHRINE 8 MG IN D5W 100 ML (0.08MG/ML) PREMIX OPTIME
INJECTION | INTRAVENOUS | Status: AC
  Filled 2015-09-15: qty 100

## 2015-09-15 MED ORDER — FENTANYL CITRATE (PF) 100 MCG/2ML IJ SOLN
INTRAMUSCULAR | Status: DC | PRN
  Administered 2015-09-15 (×2): 25 ug via INTRAVENOUS
  Administered 2015-09-15: 10 ug via INTRATHECAL

## 2015-09-15 MED ORDER — TETANUS-DIPHTH-ACELL PERTUSSIS 5-2.5-18.5 LF-MCG/0.5 IM SUSP: 0.5000 mL | Freq: Once | INTRAMUSCULAR | Status: DC

## 2015-09-15 MED ORDER — COCONUT OIL OIL: 1.0000 "application " | TOPICAL_OIL | Status: DC | PRN

## 2015-09-15 MED ORDER — DIPHENHYDRAMINE HCL 50 MG/ML IJ SOLN
12.5000 mg | INTRAMUSCULAR | Status: DC | PRN
  Administered 2015-09-15: 12.5 mg via INTRAVENOUS
  Filled 2015-09-15: qty 1

## 2015-09-15 MED ORDER — IBUPROFEN 600 MG PO TABS
600.0000 mg | ORAL_TABLET | Freq: Four times a day (QID) | ORAL | Status: DC
  Administered 2015-09-16 – 2015-09-17 (×5): 600 mg via ORAL
  Filled 2015-09-15 (×6): qty 1

## 2015-09-15 MED ORDER — CEFAZOLIN SODIUM-DEXTROSE 2-3 GM-% IV SOLR
INTRAVENOUS | Status: DC | PRN
  Administered 2015-09-15: 2 g via INTRAVENOUS

## 2015-09-15 MED ORDER — KETAMINE HCL 10 MG/ML IJ SOLN
INTRAMUSCULAR | Status: AC
  Filled 2015-09-15: qty 1

## 2015-09-15 MED ORDER — CITRIC ACID-SODIUM CITRATE 334-500 MG/5ML PO SOLN
30.0000 mL | Freq: Once | ORAL | Status: AC
  Administered 2015-09-15: 30 mL via ORAL
  Filled 2015-09-15: qty 15

## 2015-09-15 MED ORDER — PRENATAL MULTIVITAMIN CH
1.0000 | ORAL_TABLET | Freq: Every day | ORAL | Status: DC
  Administered 2015-09-16 – 2015-09-17 (×2): 1 via ORAL
  Filled 2015-09-15 (×2): qty 1

## 2015-09-15 MED ORDER — KETOROLAC TROMETHAMINE 30 MG/ML IJ SOLN
30.0000 mg | Freq: Four times a day (QID) | INTRAMUSCULAR | Status: AC | PRN
  Administered 2015-09-16: 30 mg via INTRAVENOUS
  Filled 2015-09-15: qty 1

## 2015-09-15 MED ORDER — SIMETHICONE 80 MG PO CHEW: 80.0000 mg | CHEWABLE_TABLET | ORAL | Status: DC | PRN

## 2015-09-15 MED ORDER — NALBUPHINE HCL 10 MG/ML IJ SOLN
10.0000 mg | INTRAMUSCULAR | Status: DC | PRN
  Filled 2015-09-15: qty 1

## 2015-09-15 MED ORDER — CEFAZOLIN SODIUM-DEXTROSE 2-4 GM/100ML-% IV SOLN
INTRAVENOUS | Status: AC
  Filled 2015-09-15: qty 100

## 2015-09-15 MED ORDER — HYDROCHLOROTHIAZIDE 25 MG PO TABS
25.0000 mg | ORAL_TABLET | Freq: Every day | ORAL | Status: DC
  Administered 2015-09-15 – 2015-09-17 (×3): 25 mg via ORAL
  Filled 2015-09-15 (×3): qty 1

## 2015-09-15 MED ORDER — ONDANSETRON HCL 4 MG/2ML IJ SOLN
INTRAMUSCULAR | Status: AC
  Filled 2015-09-15: qty 2

## 2015-09-15 MED ORDER — WITCH HAZEL-GLYCERIN EX PADS: 1.0000 "application " | MEDICATED_PAD | CUTANEOUS | Status: DC | PRN

## 2015-09-15 MED ORDER — KETOROLAC TROMETHAMINE 30 MG/ML IJ SOLN
30.0000 mg | Freq: Four times a day (QID) | INTRAMUSCULAR | Status: AC | PRN
  Administered 2015-09-15: 30 mg via INTRAMUSCULAR

## 2015-09-15 MED ORDER — FENTANYL CITRATE (PF) 100 MCG/2ML IJ SOLN
INTRAMUSCULAR | Status: AC
  Filled 2015-09-15: qty 2

## 2015-09-15 MED ORDER — NALOXONE HCL 2 MG/2ML IJ SOSY
1.0000 ug/kg/h | PREFILLED_SYRINGE | INTRAVENOUS | Status: DC | PRN
  Filled 2015-09-15: qty 2

## 2015-09-15 MED ORDER — ACETAMINOPHEN 500 MG PO TABS
1000.0000 mg | ORAL_TABLET | Freq: Four times a day (QID) | ORAL | Status: AC
  Administered 2015-09-15 – 2015-09-16 (×3): 1000 mg via ORAL
  Filled 2015-09-15 (×3): qty 2

## 2015-09-15 MED ORDER — FENTANYL CITRATE (PF) 100 MCG/2ML IJ SOLN
25.0000 ug | INTRAMUSCULAR | Status: DC | PRN
  Administered 2015-09-15: 50 ug via INTRAVENOUS

## 2015-09-15 MED ORDER — BUPIVACAINE IN DEXTROSE 0.75-8.25 % IT SOLN
INTRATHECAL | Status: DC | PRN
  Administered 2015-09-15: 1.6 mg via INTRATHECAL

## 2015-09-15 MED ORDER — OXYTOCIN 40 UNITS IN LACTATED RINGERS INFUSION - SIMPLE MED: 2.5000 [IU]/h | INTRAVENOUS | Status: AC

## 2015-09-15 MED ORDER — SENNOSIDES-DOCUSATE SODIUM 8.6-50 MG PO TABS
2.0000 | ORAL_TABLET | ORAL | Status: DC
  Administered 2015-09-16 (×2): 2 via ORAL
  Filled 2015-09-15 (×2): qty 2

## 2015-09-15 MED ORDER — OXYCODONE HCL 5 MG PO TABS
10.0000 mg | ORAL_TABLET | ORAL | Status: DC | PRN
  Administered 2015-09-17: 10 mg via ORAL
  Filled 2015-09-15: qty 2

## 2015-09-15 MED ORDER — LABETALOL HCL 5 MG/ML IV SOLN
10.0000 mg | Freq: Once | INTRAVENOUS | Status: AC
  Administered 2015-09-15: 10 mg via INTRAVENOUS
  Filled 2015-09-15: qty 4

## 2015-09-15 MED ORDER — SIMETHICONE 80 MG PO CHEW
80.0000 mg | CHEWABLE_TABLET | ORAL | Status: DC
  Administered 2015-09-16 (×2): 80 mg via ORAL
  Filled 2015-09-15 (×2): qty 1

## 2015-09-15 MED ORDER — OXYCODONE HCL 5 MG PO TABS
5.0000 mg | ORAL_TABLET | ORAL | Status: DC | PRN
  Administered 2015-09-16: 5 mg via ORAL
  Filled 2015-09-15 (×3): qty 1

## 2015-09-15 MED ORDER — DIPHENHYDRAMINE HCL 25 MG PO CAPS: 25.0000 mg | ORAL_CAPSULE | Freq: Four times a day (QID) | ORAL | Status: DC | PRN

## 2015-09-15 MED ORDER — OXYTOCIN 10 UNIT/ML IJ SOLN
INTRAMUSCULAR | Status: AC
  Filled 2015-09-15: qty 4

## 2015-09-15 MED ORDER — SODIUM CHLORIDE 0.9 % IR SOLN
Status: DC | PRN
  Administered 2015-09-15: 1000 mL

## 2015-09-15 MED ORDER — DIPHENHYDRAMINE HCL 25 MG PO CAPS
25.0000 mg | ORAL_CAPSULE | ORAL | Status: DC | PRN
  Administered 2015-09-16 (×4): 25 mg via ORAL
  Filled 2015-09-15 (×4): qty 1

## 2015-09-15 MED ORDER — LACTATED RINGERS IV SOLN
INTRAVENOUS | Status: DC
  Administered 2015-09-16: 01:00:00 via INTRAVENOUS

## 2015-09-15 MED ORDER — MENTHOL 3 MG MT LOZG: 1.0000 | LOZENGE | OROMUCOSAL | Status: DC | PRN

## 2015-09-15 MED ORDER — ONDANSETRON HCL 4 MG/2ML IJ SOLN: 4.0000 mg | Freq: Three times a day (TID) | INTRAMUSCULAR | Status: DC | PRN

## 2015-09-15 SURGICAL SUPPLY — 32 items
CLAMP CORD UMBIL (MISCELLANEOUS) IMPLANT
CONTAINER PREFILL 10% NBF 15ML (MISCELLANEOUS) IMPLANT
DRESSING DISP NPWT PICO 4X12 (MISCELLANEOUS) ×3 IMPLANT
DRSG OPSITE POSTOP 4X10 (GAUZE/BANDAGES/DRESSINGS) ×3 IMPLANT
DURAPREP 26ML APPLICATOR (WOUND CARE) ×3 IMPLANT
ELECT REM PT RETURN 9FT ADLT (ELECTROSURGICAL) ×3
ELECTRODE REM PT RTRN 9FT ADLT (ELECTROSURGICAL) ×1 IMPLANT
EXTRACTOR VACUUM M CUP 4 TUBE (SUCTIONS) ×2 IMPLANT
EXTRACTOR VACUUM M CUP 4' TUBE (SUCTIONS) ×1
GLOVE BIOGEL PI IND STRL 6.5 (GLOVE) ×1 IMPLANT
GLOVE BIOGEL PI IND STRL 7.0 (GLOVE) ×1 IMPLANT
GLOVE BIOGEL PI INDICATOR 6.5 (GLOVE) ×2
GLOVE BIOGEL PI INDICATOR 7.0 (GLOVE) ×2
GLOVE SURG SS PI 6.0 STRL IVOR (GLOVE) ×3 IMPLANT
GOWN STRL REUS W/TWL LRG LVL3 (GOWN DISPOSABLE) ×6 IMPLANT
HOVERMATT SINGLE USE (MISCELLANEOUS) ×3 IMPLANT
KIT ABG SYR 3ML LUER SLIP (SYRINGE) IMPLANT
NEEDLE HYPO 25X5/8 SAFETYGLIDE (NEEDLE) IMPLANT
NS IRRIG 1000ML POUR BTL (IV SOLUTION) ×3 IMPLANT
PACK C SECTION WH (CUSTOM PROCEDURE TRAY) ×3 IMPLANT
PAD OB MATERNITY 4.3X12.25 (PERSONAL CARE ITEMS) ×3 IMPLANT
PENCIL SMOKE EVAC W/HOLSTER (ELECTROSURGICAL) ×3 IMPLANT
RETRACTOR TRAXI PANNICULUS (MISCELLANEOUS) ×1 IMPLANT
RTRCTR C-SECT PINK 25CM LRG (MISCELLANEOUS) IMPLANT
SEPRAFILM MEMBRANE 5X6 (MISCELLANEOUS) IMPLANT
SUT PLAIN 0 NONE (SUTURE) IMPLANT
SUT PLAIN 2 0 XLH (SUTURE) ×3 IMPLANT
SUT VIC AB 0 CT1 36 (SUTURE) ×12 IMPLANT
SUT VIC AB 4-0 KS 27 (SUTURE) ×3 IMPLANT
TOWEL OR 17X24 6PK STRL BLUE (TOWEL DISPOSABLE) ×3 IMPLANT
TRAXI PANNICULUS RETRACTOR (MISCELLANEOUS) ×2
TRAY FOLEY CATH SILVER 14FR (SET/KITS/TRAYS/PACK) ×3 IMPLANT

## 2015-09-15 NOTE — Anesthesia Procedure Notes (Signed)
Spinal Patient location during procedure: OR Staffing Anesthesiologist: Anav Lammert Performed by: anesthesiologist  Preanesthetic Checklist Completed: patient identified, site marked, surgical consent, pre-op evaluation, timeout performed, IV checked, risks and benefits discussed and monitors and equipment checked Spinal Block Patient position: sitting Prep: DuraPrep Patient monitoring: continuous pulse ox, blood pressure and heart rate Approach: midline Location: L3-4 Injection technique: single-shot Needle Needle type: Pencan  Needle gauge: 24 G Needle length: 9 cm Additional Notes Functioning IV was confirmed and monitors were applied. Sterile prep and drape, including hand hygiene, mask and sterile gloves were used. The patient was positioned and the spine was prepped. The skin was anesthetized with lidocaine.  Free flow of clear CSF was obtained prior to injecting local anesthetic into the CSF.  The spinal needle aspirated freely following injection.  The needle was carefully withdrawn.  The patient tolerated the procedure well. Consent was obtained prior to procedure with all questions answered and concerns addressed. Risks including but not limited to bleeding, infection, nerve damage, paralysis, failed block, inadequate analgesia, allergic reaction, high spinal, itching and headache were discussed and the patient wished to proceed.   Lauretta Grill, MD

## 2015-09-15 NOTE — Progress Notes (Addendum)
Evaluated pt. [place] read 38wk5d w oligo. Discussed need to move forward with delivery. Discussed vbac vs csection. Pt prefers repeat c/s so will move to OR for c/s 2/2 oligohydramnios. Dr. Elly Modena made aware. Ancef, SCDs ordered. Labs already resulted. Category 1 strip. Pt plans to place pt up for adoption, SW made aware.

## 2015-09-15 NOTE — Anesthesia Preprocedure Evaluation (Signed)
Anesthesia Evaluation  Patient identified by MRN, date of birth, ID band Patient awake    Reviewed: Allergy & Precautions, NPO status , Patient's Chart, lab work & pertinent test results  History of Anesthesia Complications Negative for: history of anesthetic complications  Airway Mallampati: II  TM Distance: >3 FB Neck ROM: Full    Dental no notable dental hx. (+) Dental Advisory Given   Pulmonary Current Smoker,    Pulmonary exam normal breath sounds clear to auscultation       Cardiovascular hypertension, negative cardio ROS Normal cardiovascular exam Rhythm:Regular Rate:Normal     Neuro/Psych negative neurological ROS  negative psych ROS   GI/Hepatic negative GI ROS, Neg liver ROS,   Endo/Other  Morbid obesity  Renal/GU negative Renal ROS  negative genitourinary   Musculoskeletal negative musculoskeletal ROS (+)   Abdominal   Peds negative pediatric ROS (+)  Hematology negative hematology ROS (+)   Anesthesia Other Findings   Reproductive/Obstetrics (+) Pregnancy                             Anesthesia Physical Anesthesia Plan  ASA: III  Anesthesia Plan: Spinal   Post-op Pain Management:    Induction:   Airway Management Planned:   Additional Equipment:   Intra-op Plan:   Post-operative Plan:   Informed Consent: I have reviewed the patients History and Physical, chart, labs and discussed the procedure including the risks, benefits and alternatives for the proposed anesthesia with the patient or authorized representative who has indicated his/her understanding and acceptance.   Dental advisory given  Plan Discussed with: CRNA  Anesthesia Plan Comments:         Anesthesia Quick Evaluation
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
## 2015-09-15 NOTE — Op Note (Addendum)
Tabitha Holland PROCEDURE DATE: 09/15/2015  PREOPERATIVE DIAGNOSIS: Intrauterine pregnancy at  [redacted]w[redacted]d weeks gestation; oligohydramnios and patient declines vag del attempt  POSTOPERATIVE DIAGNOSIS: The same  PROCEDURE:     Cesarean Section  SURGEON:  Dr. Mora Bellman  ASSISTANT: Dr. Si Raider  INDICATIONS: Tabitha Holland is a 30 y.o. [id number] at [redacted]w[redacted]d scheduled for cesarean section secondary to oligohydramnios and patient declines vag del attempt.  The risks of cesarean section discussed with the patient included but were not limited to: bleeding which may require transfusion or reoperation; infection which may require antibiotics; injury to bowel, bladder, ureters or other surrounding organs; injury to the fetus; need for additional procedures including hysterectomy in the event of a life-threatening hemorrhage; placental abnormalities wth subsequent pregnancies, incisional problems, thromboembolic phenomenon and other postoperative/anesthesia complications. The patient concurred with the proposed plan, giving informed written consent for the procedure.    FINDINGS:  Viable female infant in cephalic presentation.  Apgars 9 and 9.  Clear amniotic fluid.  Intact placenta, three vessel cord.  Normal uterus, fallopian tubes and ovaries bilaterally.  ANESTHESIA:    Spinal INTRAVENOUS FLUIDS:3000 ml ESTIMATED BLOOD LOSS: 700 ml URINE OUTPUT:  200 ml SPECIMENS: Placenta sent to pathology COMPLICATIONS: None immediate  PROCEDURE IN DETAIL:  The patient received intravenous antibiotics and had sequential compression devices applied to her lower extremities while in the preoperative area.  She was then taken to the operating room where anesthesia was induced and was found to be adequate. A foley catheter was placed into her bladder and attached to Tabitha Holland gravity. She was then placed in a dorsal supine position with a leftward tilt, and prepped and draped in a sterile manner. After an adequate  timeout was performed, a Pfannenstiel skin incision was made with scalpel and carried through to the underlying layer of fascia. The fascia was incised in the midline and this incision was extended bilaterally using the Mayo scissors. Kocher clamps were applied to the superior aspect of the fascial incision and the underlying rectus muscles were dissected off bluntly. A similar process was carried out on the inferior aspect of the facial incision. The rectus muscles were separated in the midline bluntly and the peritoneum was entered bluntly. The Alexis self-retaining retractor was introduced into the abdominal cavity. Attention was turned to the lower uterine segment where a transverse hysterotomy was made with a scalpel and extended bilaterally bluntly. Due to the patient's large BMI and the inability to adequately feel the fundus, the infant was successfully delivered with the assistance of vacuum.with one pull. The cord was clamped and cut and infant was handed over to awaiting neonatology team. Uterine massage was then administered and the placenta delivered intact with three-vessel cord. The uterus was cleared of clot and debris.  The hysterotomy was closed with 0 Vicryl in a running locked fashion, and an imbricating layer was also placed with a 0 Vicryl. Overall, excellent hemostasis was noted. The pelvis copiously irrigated and cleared of all clot and debris. Hemostasis was confirmed on all surfaces.  The peritoneum and the muscles were reapproximated using 0 vicryl interrupted stitches. The fascia was then closed using 0 Vicryl in a running fashion.  The subcutaneous layer was reapproximated with plain gut and the skin was closed in a subcuticular fashion using 3.0 Vicryl. Due to the patient's high BMI, a PICO dressing was applied over the incision. The patient tolerated the procedure well. Sponge, lap, instrument and needle counts were correct x 2. She was taken  to the recovery room in stable condition.     Tabitha Holland,PEGGYMD  09/15/2015 5:36 PM

## 2015-09-15 NOTE — Transfer of Care (Signed)
Immediate Anesthesia Transfer of Care Note  Patient: Tabitha Holland  Procedure(s) Performed: Procedure(s): CESAREAN SECTION (N/A)  Patient Location: PACU  Anesthesia Type:Spinal  Level of Consciousness: awake  Airway & Oxygen Therapy: Patient Spontanous Breathing  Post-op Assessment: Report given to RN  Post vital signs: Reviewed and stable  Last Vitals:  Filed Vitals:   09/15/15 1508 09/15/15 1517  BP: 186/103 156/84  Pulse: 76 57    Last Pain:  Filed Vitals:   09/15/15 1711  PainSc: 5          Complications: No apparent anesthesia complications
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
## 2015-09-15 NOTE — Progress Notes (Signed)
30 yo [id number] at [redacted]w[redacted]d based on today's ultrasound being admitted for elective repeat cesarean section secondary to oligohydramnios. Patient without prenatal care. She denies leakage of fluid or abnormal vaginal discharge. Risks of TOLAC vs RCS were reviewed with the patient. She opted for repeat cesarean section. Risks of bleeding, infection and damage to adjacent organs were reviewed and explained including but not limited to risks of bleeding, infection and damage to adjacent organs. Patient verbalized understanding and all questions were answered. Patient is planning on placing the infant for adoption. She voiced that she does not want to perform skin to skin or see the infant.
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
## 2015-09-16 DIAGNOSIS — O1002 Pre-existing essential hypertension complicating childbirth: Secondary | ICD-10-CM | POA: Diagnosis not present

## 2015-09-16 DIAGNOSIS — O4103X0 Oligohydramnios, third trimester, not applicable or unspecified: Secondary | ICD-10-CM | POA: Diagnosis not present

## 2015-09-16 DIAGNOSIS — O99334 Smoking (tobacco) complicating childbirth: Secondary | ICD-10-CM | POA: Diagnosis not present

## 2015-09-16 DIAGNOSIS — O34211 Maternal care for low transverse scar from previous cesarean delivery: Secondary | ICD-10-CM | POA: Diagnosis not present

## 2015-09-16 LAB — CBC
HCT: 27.3 % — ABNORMAL LOW (ref 36.0–46.0)
Hemoglobin: 8.7 g/dL — ABNORMAL LOW (ref 12.0–15.0)
MCH: 25.4 pg — ABNORMAL LOW (ref 26.0–34.0)
MCHC: 31.9 g/dL (ref 30.0–36.0)
MCV: 79.6 fL (ref 78.0–100.0)
PLATELETS: 210 10*3/uL (ref 150–400)
RBC: 3.43 MIL/uL — ABNORMAL LOW (ref 3.87–5.11)
RDW: 15.1 % (ref 11.5–15.5)
WBC: 9.4 10*3/uL (ref 4.0–10.5)

## 2015-09-16 LAB — RUBELLA SCREEN: Rubella: 2.33 index (ref >0.99)

## 2015-09-16 LAB — RPR: RPR Ser Ql: NONREACTIVE

## 2015-09-16 LAB — PROTEIN / CREATININE RATIO, URINE
CREATININE, URINE: 440 mg/dL
PROTEIN CREATININE RATIO: 0.22 mg/mg{creat} — AB (ref 0.00–0.15)
Total Protein, Urine: 98 mg/dL

## 2015-09-16 LAB — HEPATITIS B SURFACE ANTIGEN: HEP B S AG: NEGATIVE

## 2015-09-16 LAB — RAPID URINE DRUG SCREEN, HOSP PERFORMED
Amphetamines: NOT DETECTED
BARBITURATES: NOT DETECTED
BENZODIAZEPINES: NOT DETECTED
COCAINE: NOT DETECTED
Opiates: POSITIVE — AB
TETRAHYDROCANNABINOL: POSITIVE — AB

## 2015-09-16 MED ORDER — AMLODIPINE BESYLATE 5 MG PO TABS
5.0000 mg | ORAL_TABLET | Freq: Every day | ORAL | Status: DC
  Administered 2015-09-17: 5 mg via ORAL
  Filled 2015-09-16 (×2): qty 1

## 2015-09-16 MED ORDER — OXYCODONE-ACETAMINOPHEN 5-325 MG PO TABS
1.0000 | ORAL_TABLET | ORAL | Status: DC | PRN
  Administered 2015-09-16 (×2): 1 via ORAL
  Filled 2015-09-16 (×3): qty 1

## 2015-09-16 MED ORDER — LABETALOL HCL 200 MG PO TABS
200.0000 mg | ORAL_TABLET | Freq: Once | ORAL | Status: AC
  Administered 2015-09-16: 200 mg via ORAL
  Filled 2015-09-16: qty 1

## 2015-09-16 NOTE — Progress Notes (Signed)
MOB has signed relinquishment of parental rights paperwork and therefore, [employer] now has custody of the baby.  Paperwork placed on baby's chart.  CAS staff state adoptive parents will be here in the morning.

## 2015-09-16 NOTE — Anesthesia Postprocedure Evaluation (Signed)
Anesthesia Post Note  Patient: Tabitha Holland A XXXThornton  Procedure(s) Performed: Procedure(s) (LRB): CESAREAN SECTION (N/A)  Patient location during evaluation: [facility] Anesthesia Type: Spinal Level of consciousness: oriented and awake and alert Pain management: pain level controlled Vital Signs Assessment: post-procedure vital signs reviewed and stable Respiratory status: spontaneous breathing and nonlabored ventilation Cardiovascular status: stable Postop Assessment: patient able to bend at knees, no signs of nausea or vomiting and adequate PO intake Anesthetic complications: no     Last Vitals:  Filed Vitals:   09/16/15 0559 09/16/15 0643  BP: 157/97 148/81  Pulse: 65 60  Temp: 36.8 C   Resp: 20 20    Last Pain:  Filed Vitals:   09/16/15 0643  PainSc: 6    Pain Goal: Patients Stated Pain Goal: 3 (09/16/15 0623)               Jabier Mutton

## 2015-09-16 NOTE — Progress Notes (Signed)
Attempted follow up visit to offer additional support, but patient was sleeping.  Please page as further needs arise.  Donald Prose. Elyn Peers, M.Div. [facility] Chaplain Pager [phone] Office [phone]     09/16/15 1633  Clinical Encounter Type  Visited With Patient;Patient not available  Visit Type Spiritual support  Referral From Chaplain

## 2015-09-16 NOTE — Progress Notes (Signed)
Spoke with Dr. Venia Carbon  At x [phone] regarding oxy IR issue and possible allergy/adverse reactions to such.  Dr. Venia Carbon is looking into alternate pain relief measures.  Orders forthcoming.

## 2015-09-16 NOTE — Progress Notes (Signed)
Subjective: Postpartum Day 1: Cesarean Delivery Patient reports feeling well. Her pain is well controlled with the pain medication. She ambulated in the room without complications. She denies feeling dizzy or lightheaded. She is voiding well and tolerating po intake without nausea and emesis. Patient does not want family members to know that she was pregnant.  Objective: Vital signs in last 24 hours: Temp:  [97.6 F (36.4 C)-98.7 F (37.1 C)] 98.3 F (36.8 C) (05/18 0559) Pulse Rate:  [47-95] 60 (05/18 0643) Resp:  [4-26] 20 (05/18 0643) BP: (132-186)/(68-113) 148/81 mmHg (05/18 0643) SpO2:  [90 %-100 %] 96 % (05/18 0559) Weight:  [324 lb 15.3 oz (147.4 kg)] 324 lb 15.3 oz (147.4 kg) (05/17 1902)  Physical Exam:  General: alert, cooperative, no distress and morbidly obese Lochia: appropriate Uterine Fundus: soft Incision: PICO dressing intact DVT Evaluation: No evidence of DVT seen on physical exam.   Recent Labs  09/15/15 1200 09/16/15 0517  HGB 9.4* 8.7*  HCT 29.8* 27.3*    Assessment/Plan: Status post Cesarean section. Doing well postoperatively.  Continue current care Encouraged ambulation Social work consult today.  Donyea Beverlin 09/16/2015, 7:09 AM

## 2015-09-16 NOTE — Progress Notes (Signed)
Filed Vitals:   09/16/15 2157 09/16/15 2210  BP: 184/91 168/90  Pulse: 60   Temp: 98.5 F (36.9 C)   Resp: 20    Had HCTZ this am.  Will give PO labetalol 200mg  now ( now IV) and start Norvasc in the am

## 2015-09-16 NOTE — Progress Notes (Signed)
CSW received call from Devine D./CAS stating she and M. Hancock are here to meet with MOB.  CSW called MOB's room to ensure that she did not have any visitors.  She states she does not.  CSW introduced MOB to CAS representatives and asked her to call if she needs anything.
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
## 2015-09-17 DIAGNOSIS — O1002 Pre-existing essential hypertension complicating childbirth: Secondary | ICD-10-CM | POA: Diagnosis not present

## 2015-09-17 DIAGNOSIS — O34211 Maternal care for low transverse scar from previous cesarean delivery: Secondary | ICD-10-CM | POA: Diagnosis not present

## 2015-09-17 DIAGNOSIS — O99334 Smoking (tobacco) complicating childbirth: Secondary | ICD-10-CM | POA: Diagnosis not present

## 2015-09-17 DIAGNOSIS — Z98891 History of uterine scar from previous surgery: Secondary | ICD-10-CM

## 2015-09-17 DIAGNOSIS — O4103X0 Oligohydramnios, third trimester, not applicable or unspecified: Secondary | ICD-10-CM | POA: Diagnosis not present

## 2015-09-17 MED ORDER — AMLODIPINE BESYLATE 5 MG PO TABS: 5.0000 mg | ORAL_TABLET | Freq: Every day | ORAL | Status: DC

## 2015-09-17 MED ORDER — SENNOSIDES-DOCUSATE SODIUM 8.6-50 MG PO TABS: 2.0000 | ORAL_TABLET | ORAL | Status: DC

## 2015-09-17 MED ORDER — PRENATAL MULTIVITAMIN CH: 1.0000 | ORAL_TABLET | Freq: Every day | ORAL | Status: DC

## 2015-09-17 MED ORDER — HYDROCHLOROTHIAZIDE 25 MG PO TABS: 25.0000 mg | ORAL_TABLET | Freq: Every day | ORAL | Status: DC

## 2015-09-17 MED ORDER — IBUPROFEN 600 MG PO TABS: 600.0000 mg | ORAL_TABLET | Freq: Four times a day (QID) | ORAL | Status: DC

## 2015-09-17 MED ORDER — OXYCODONE-ACETAMINOPHEN 5-325 MG PO TABS: 1.0000 | ORAL_TABLET | ORAL | Status: DC | PRN

## 2015-09-17 NOTE — Progress Notes (Signed)
Pt. Is discharged in the care of friend per ambulatory,with N.T. Escort. Denies any pain or discomfort.Spirits are sad. Emotional support  Was given. Pt. Was allowed to verbalize fear and anxiety  About situations.  No equipment needed for home use. Stable.

## 2015-09-17 NOTE — Progress Notes (Signed)
CSW has followed up with the [facility] and confirmed that they will accept a referral since Tabitha Holland has a young child in the home.  Referral maCSW met with Tabitha Holland to provide ongoing support.  Tabitha Holland appeared to be in good spirits and states she is feeling good about her decision to make adoption plan.  de to [employer].   CSW met with Tabitha Holland to provide ongoing support.  Tabitha Holland appeared to be in good spirits and states she is feeling good about her decision to make adoption plan.  She states she plans to meet the adoptive parents this morning and see baby one more time before she is discharged.  CSW informed Tabitha Holland of referral made to [facility], which Tabitha Holland agreed to when we met yesterday.  Tabitha Holland thanked CSW and states no further questions or needs at this time.
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
## 2015-09-21 ENCOUNTER — Other Ambulatory Visit: Payer: Self-pay | Admitting: Family Medicine

## 2015-09-21 MED ORDER — HYDROCHLOROTHIAZIDE 25 MG PO TABS: 25.0000 mg | ORAL_TABLET | Freq: Every day | ORAL | Status: DC

## 2015-09-21 MED ORDER — AMLODIPINE BESYLATE 5 MG PO TABS: 5.0000 mg | ORAL_TABLET | Freq: Every day | ORAL | Status: DC

## 2015-10-27 ENCOUNTER — Ambulatory Visit: Payer: Self-pay | Admitting: Family

## 2016-01-14 LAB — GLUCOSE, POCT (MANUAL RESULT ENTRY): POC Glucose: 92 mg/dl (ref 70–99)

## 2016-05-23 ENCOUNTER — Emergency Department (HOSPITAL_COMMUNITY)
Admission: EM | Admit: 2016-05-23 | Discharge: 2016-05-23 | Disposition: A | Discharge status: Home / Self Care | Payer: 59 | Attending: Dermatology | Admitting: Dermatology

## 2016-05-23 ENCOUNTER — Encounter (HOSPITAL_COMMUNITY): Payer: Self-pay | Admitting: Emergency Medicine

## 2016-05-23 DIAGNOSIS — R51 Headache: Secondary | ICD-10-CM | POA: Insufficient documentation

## 2016-05-23 DIAGNOSIS — Z5321 Procedure and treatment not carried out due to patient leaving prior to being seen by health care provider: Secondary | ICD-10-CM | POA: Insufficient documentation

## 2016-05-23 NOTE — ED Notes (Signed)
Pt called from lobby with no response x3 

## 2016-05-23 NOTE — ED Provider Notes (Signed)
Patient left without being seen, I did not see or evaluate them.    Leo Grosser, MD 05/23/16 2200

## 2016-05-23 NOTE — ED Triage Notes (Signed)
Per EMS, patient from home, c/o headache since waking up this morning. Hx migraines. Ambulatory with EMS. Denies blurred vision, N/V/D, SOB, chest pain and abdominal pain.

## 2016-05-24 ENCOUNTER — Encounter (HOSPITAL_COMMUNITY): Payer: Self-pay

## 2016-05-24 ENCOUNTER — Emergency Department (HOSPITAL_COMMUNITY)
Admission: EM | Admit: 2016-05-24 | Discharge: 2016-05-24 | Disposition: A | Discharge status: Home / Self Care | Payer: 59 | Attending: Dermatology | Admitting: Dermatology

## 2016-05-24 DIAGNOSIS — Z5321 Procedure and treatment not carried out due to patient leaving prior to being seen by health care provider: Secondary | ICD-10-CM | POA: Insufficient documentation

## 2016-05-24 DIAGNOSIS — R51 Headache: Secondary | ICD-10-CM | POA: Insufficient documentation

## 2016-05-24 DIAGNOSIS — Z79899 Other long term (current) drug therapy: Secondary | ICD-10-CM | POA: Insufficient documentation

## 2016-05-24 DIAGNOSIS — F17210 Nicotine dependence, cigarettes, uncomplicated: Secondary | ICD-10-CM | POA: Insufficient documentation

## 2016-05-24 DIAGNOSIS — I10 Essential (primary) hypertension: Secondary | ICD-10-CM | POA: Insufficient documentation

## 2016-05-24 NOTE — ED Notes (Signed)
Pt stated she was leaving the hospital due to the wait. Ed Tech tried to convince pt otherwise.

## 2016-05-24 NOTE — ED Triage Notes (Signed)
Pt endorses headache that began this morning with 1 episode of vomiting. Pt went to [facility] ED and left after triage because "I felt like they were too many sick people there and I don't want the flu" Neuro intact, pt ambulatory.

## 2016-05-24 NOTE — ED Notes (Signed)
Called pt name for vitals. No response.

## 2017-11-26 ENCOUNTER — Encounter (HOSPITAL_COMMUNITY): Payer: Self-pay | Admitting: Emergency Medicine

## 2017-11-26 ENCOUNTER — Encounter (HOSPITAL_COMMUNITY): Payer: Self-pay | Admitting: Radiology

## 2017-11-26 ENCOUNTER — Other Ambulatory Visit: Payer: Self-pay

## 2017-11-26 ENCOUNTER — Emergency Department (HOSPITAL_COMMUNITY): Payer: Self-pay

## 2017-11-26 ENCOUNTER — Encounter (HOSPITAL_COMMUNITY): Payer: Self-pay

## 2017-11-26 ENCOUNTER — Emergency Department (HOSPITAL_COMMUNITY)
Admission: EM | Admit: 2017-11-26 | Discharge: 2017-11-26 | Disposition: A | Discharge status: Home / Self Care | Payer: Self-pay | Attending: Emergency Medicine | Admitting: Emergency Medicine

## 2017-11-26 ENCOUNTER — Emergency Department (HOSPITAL_COMMUNITY)
Admission: EM | Admit: 2017-11-26 | Discharge: 2017-11-27 | Disposition: A | Discharge status: Home / Self Care | Payer: Self-pay | Attending: Emergency Medicine | Admitting: Emergency Medicine

## 2017-11-26 DIAGNOSIS — O468X2 Other antepartum hemorrhage, second trimester: Secondary | ICD-10-CM | POA: Insufficient documentation

## 2017-11-26 DIAGNOSIS — R1031 Right lower quadrant pain: Secondary | ICD-10-CM | POA: Insufficient documentation

## 2017-11-26 DIAGNOSIS — R102 Pelvic and perineal pain: Secondary | ICD-10-CM | POA: Insufficient documentation

## 2017-11-26 DIAGNOSIS — Z3201 Encounter for pregnancy test, result positive: Secondary | ICD-10-CM

## 2017-11-26 DIAGNOSIS — F17210 Nicotine dependence, cigarettes, uncomplicated: Secondary | ICD-10-CM | POA: Insufficient documentation

## 2017-11-26 DIAGNOSIS — Z3A16 16 weeks gestation of pregnancy: Secondary | ICD-10-CM | POA: Insufficient documentation

## 2017-11-26 DIAGNOSIS — I10 Essential (primary) hypertension: Secondary | ICD-10-CM | POA: Insufficient documentation

## 2017-11-26 DIAGNOSIS — O418X20 Other specified disorders of amniotic fluid and membranes, second trimester, not applicable or unspecified: Secondary | ICD-10-CM | POA: Insufficient documentation

## 2017-11-26 LAB — URINALYSIS, ROUTINE W REFLEX MICROSCOPIC
Bilirubin Urine: NEGATIVE
GLUCOSE, UA: NEGATIVE mg/dL
HGB URINE DIPSTICK: NEGATIVE
Ketones, ur: NEGATIVE mg/dL
NITRITE: NEGATIVE
PH: 7 (ref 5.0–8.0)
PROTEIN: 30 mg/dL — AB
Specific Gravity, Urine: 1.024 (ref 1.005–1.030)

## 2017-11-26 LAB — COMPREHENSIVE METABOLIC PANEL
ALT: 17 U/L (ref 0–44)
ALT: 14 U/L (ref 0–44)
AST: 15 U/L (ref 15–41)
AST: 15 U/L (ref 15–41)
Albumin: 3.2 g/dL — ABNORMAL LOW (ref 3.5–5.0)
Albumin: 3 g/dL — ABNORMAL LOW (ref 3.5–5.0)
Alkaline Phosphatase: 85 U/L (ref 38–126)
Alkaline Phosphatase: 76 U/L (ref 38–126)
Anion gap: 8 (ref 5–15)
Anion gap: 8 (ref 5–15)
BILIRUBIN TOTAL: 0.5 mg/dL (ref 0.3–1.2)
BILIRUBIN TOTAL: 0.8 mg/dL (ref 0.3–1.2)
BUN: <5 mg/dL — ABNORMAL LOW (ref 6–20)
BUN: <5 mg/dL — ABNORMAL LOW (ref 6–20)
CALCIUM: 9 mg/dL (ref 8.9–10.3)
CO2: 21 mmol/L — ABNORMAL LOW (ref 22–32)
CO2: 22 mmol/L (ref 22–32)
Calcium: 8.8 mg/dL — ABNORMAL LOW (ref 8.9–10.3)
Chloride: 107 mmol/L (ref 98–111)
Chloride: 106 mmol/L (ref 98–111)
Creatinine, Ser: 0.53 mg/dL (ref 0.44–1.00)
Creatinine, Ser: 0.55 mg/dL (ref 0.44–1.00)
GFR calc Af Amer: >60 mL/min (ref >60)
GFR calc non Af Amer: >60 mL/min (ref >60)
Glucose, Bld: 97 mg/dL (ref 70–99)
Glucose, Bld: 83 mg/dL (ref 70–99)
POTASSIUM: 3.4 mmol/L — AB (ref 3.5–5.1)
Potassium: 3.5 mmol/L (ref 3.5–5.1)
Sodium: 136 mmol/L (ref 135–145)
Sodium: 136 mmol/L (ref 135–145)
TOTAL PROTEIN: 7 g/dL (ref 6.5–8.1)
TOTAL PROTEIN: 6.3 g/dL — AB (ref 6.5–8.1)

## 2017-11-26 LAB — GC/CHLAMYDIA PROBE AMP (~~LOC~~) NOT AT ARMC
Chlamydia: NEGATIVE
Neisseria Gonorrhea: NEGATIVE

## 2017-11-26 LAB — LIPASE, BLOOD
Lipase: 26 U/L (ref 11–51)
Lipase: 27 U/L (ref 11–51)

## 2017-11-26 LAB — WET PREP, GENITAL
Clue Cells Wet Prep HPF POC: NONE SEEN
Sperm: NONE SEEN
Trich, Wet Prep: NONE SEEN
Yeast Wet Prep HPF POC: NONE SEEN

## 2017-11-26 LAB — CBC WITH DIFFERENTIAL/PLATELET
ABS IMMATURE GRANULOCYTES: 0 10*3/uL (ref 0.0–0.1)
Basophils Absolute: 0 10*3/uL (ref 0.0–0.1)
Basophils Relative: 0 %
Eosinophils Absolute: 0.1 10*3/uL (ref 0.0–0.7)
Eosinophils Relative: 1 %
HEMATOCRIT: 31.5 % — AB (ref 36.0–46.0)
HEMOGLOBIN: 10.1 g/dL — AB (ref 12.0–15.0)
Immature Granulocytes: 1 %
LYMPHS ABS: 2.4 10*3/uL (ref 0.7–4.0)
Lymphocytes Relative: 31 %
MCH: 26.7 pg (ref 26.0–34.0)
MCHC: 32.1 g/dL (ref 30.0–36.0)
MCV: 83.3 fL (ref 78.0–100.0)
Monocytes Absolute: 0.5 10*3/uL (ref 0.1–1.0)
Monocytes Relative: 6 %
NEUTROS ABS: 4.8 10*3/uL (ref 1.7–7.7)
NEUTROS PCT: 61 %
Platelets: 187 10*3/uL (ref 150–400)
RBC: 3.78 MIL/uL — ABNORMAL LOW (ref 3.87–5.11)
RDW: 17 % — ABNORMAL HIGH (ref 11.5–15.5)
WBC: 7.8 10*3/uL (ref 4.0–10.5)

## 2017-11-26 LAB — HCG, QUANTITATIVE, PREGNANCY: hCG, Beta Chain, Quant, S: 24789 m[IU]/mL — ABNORMAL HIGH (ref <5)

## 2017-11-26 LAB — HIV ANTIBODY (ROUTINE TESTING W REFLEX): HIV Screen 4th Generation wRfx: NONREACTIVE

## 2017-11-26 LAB — CBC
HCT: 34.3 % — ABNORMAL LOW (ref 36.0–46.0)
HEMOGLOBIN: 11.1 g/dL — AB (ref 12.0–15.0)
MCH: 26.2 pg (ref 26.0–34.0)
MCHC: 32.4 g/dL (ref 30.0–36.0)
MCV: 80.9 fL (ref 78.0–100.0)
Platelets: 232 10*3/uL (ref 150–400)
RBC: 4.24 MIL/uL (ref 3.87–5.11)
RDW: 17.1 % — ABNORMAL HIGH (ref 11.5–15.5)
WBC: 10.1 10*3/uL (ref 4.0–10.5)

## 2017-11-26 LAB — ABO/RH: ABO/RH(D): O POS

## 2017-11-26 LAB — I-STAT BETA HCG BLOOD, ED (MC, WL, AP ONLY): I-stat hCG, quantitative: >2000 m[IU]/mL — ABNORMAL HIGH (ref <5)

## 2017-11-26 LAB — RPR: RPR Ser Ql: NONREACTIVE

## 2017-11-26 MED ORDER — ACETAMINOPHEN 500 MG PO TABS
1000.0000 mg | ORAL_TABLET | Freq: Once | ORAL | Status: AC
  Administered 2017-11-26: 1000 mg via ORAL
  Filled 2017-11-26: qty 2

## 2017-11-26 MED ORDER — CEPHALEXIN 500 MG PO CAPS: 500.0000 mg | ORAL_CAPSULE | Freq: Two times a day (BID) | ORAL | 0 refills | Status: DC

## 2017-11-26 MED ORDER — MORPHINE SULFATE (PF) 4 MG/ML IV SOLN
4.0000 mg | Freq: Once | INTRAVENOUS | Status: AC
  Administered 2017-11-26: 4 mg via INTRAVENOUS
  Filled 2017-11-26: qty 1

## 2017-11-26 MED ORDER — ONDANSETRON HCL 4 MG/2ML IJ SOLN: 4.0000 mg | Freq: Once | INTRAMUSCULAR | Status: DC

## 2017-11-26 MED ORDER — ONDANSETRON 4 MG PO TBDP
4.0000 mg | ORAL_TABLET | Freq: Once | ORAL | Status: AC
  Administered 2017-11-26: 4 mg via ORAL
  Filled 2017-11-26: qty 1

## 2017-11-26 MED ORDER — ACETAMINOPHEN 325 MG PO TABS
650.0000 mg | ORAL_TABLET | Freq: Once | ORAL | Status: DC
  Filled 2017-11-26: qty 2

## 2017-11-26 MED ORDER — CEFTRIAXONE SODIUM 250 MG IJ SOLR
250.0000 mg | Freq: Once | INTRAMUSCULAR | Status: AC
  Administered 2017-11-26: 250 mg via INTRAMUSCULAR
  Filled 2017-11-26: qty 250

## 2017-11-26 MED ORDER — CEPHALEXIN 500 MG PO CAPS: 500.0000 mg | ORAL_CAPSULE | Freq: Four times a day (QID) | ORAL | 0 refills | Status: AC

## 2017-11-26 MED ORDER — ONDANSETRON HCL 4 MG/2ML IJ SOLN
4.0000 mg | Freq: Once | INTRAMUSCULAR | Status: AC
  Administered 2017-11-26: 4 mg via INTRAVENOUS
  Filled 2017-11-26: qty 2

## 2017-11-26 MED ORDER — STERILE WATER FOR INJECTION IJ SOLN
INTRAMUSCULAR | Status: AC
  Filled 2017-11-26: qty 10

## 2017-11-26 MED ORDER — MORPHINE SULFATE (PF) 4 MG/ML IV SOLN
4.0000 mg | Freq: Once | INTRAVENOUS | Status: AC
  Administered 2017-11-26: 4 mg via INTRAMUSCULAR
  Filled 2017-11-26: qty 1

## 2017-11-26 MED ORDER — METOCLOPRAMIDE HCL 10 MG PO TABS: 10.0000 mg | ORAL_TABLET | Freq: Four times a day (QID) | ORAL | 0 refills | Status: DC

## 2017-11-26 NOTE — ED Provider Notes (Signed)
Tabitha Holland is a 32 y.o. female, presenting to the ED with right lower back pain radiating into the right lower quadrant of the abdomen.  HPI from Armstead Peaks, PA-C: "Tabitha Holland is a 32 y.o. 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  She reports the pain became severe at 5 PM yesterday.  She reports she has had a dull pain in that area for the past few weeks.  She has had nausea and vomiting today.  She reports some abnormal vaginal discharge for the past couple weeks.  She reports she has had regular periods for the past few months.  She was last sexually active in April 2019.  She denies any fevers, chest pain, shortness of breath, urinary symptoms."  Physical Exam  BP (!) 154/111 (BP Location: Right Arm)   Pulse (!) 103   Temp 99.1 F (37.3 C) (Oral)   Resp 17   LMP 11/12/2017   SpO2 100%   Physical Exam  Constitutional: She appears well-developed and well-nourished. No distress.  HENT:  Head: Normocephalic and atraumatic.  Eyes: Conjunctivae are normal.  Neck: Neck supple.  Cardiovascular: Normal rate, regular rhythm, normal heart sounds and intact distal pulses.  Pulmonary/Chest: Effort normal and breath sounds normal. No respiratory distress.  Abdominal: Soft. There is tenderness in the right lower quadrant. There is no guarding.  Musculoskeletal: She exhibits no edema.  Lymphadenopathy:    She has no cervical adenopathy.  Neurological: She is alert.  Skin: Skin is warm and dry. She is not diaphoretic.  Psychiatric: She has a normal mood and affect. Her behavior is normal.  Nursing note and vitals reviewed.   ED Course/Procedures   Clinical Course as of Nov 26 1629  Mon Nov 26, 2017  5956 Spoke with [place] tech. States patient refused transvaginal US.   [SJ]  [id number] Ultrasound tech just call this.  They report that on transabdominal ultrasound, they were  unable to visualize the ovaries.  Patient has declined transvaginal ultrasound. She is now an MRI.  MRI tech called [place] and stated that patient will not tolerate MRI, therefore they are sending her back to the ER.  We will talk to the patient about risk and benefits of the MRI test and the risk of giving her IV opioids and-or benzos.    [AN]  [id number] Spoke with the patient about MRI results. We also discussed the limitations of the abdominal ultrasound.  She states she is willing to try the transvaginal ultrasound again now that she has had morphine.   [SJ]  [phone] Spoke with [place] tech. States she will put patient back on the list to have transvaginal US performed. Assured I placed the correct order for patient's circumstance.   [SJ]  [id number] Currently rates her pain 5/10. Still has some tenderness in RLQ.   [SJ]  2951 MR ABDOMEN WO CONTRAST [NL]    Clinical Course User Index [AN] Varney Biles, MD [NL] Junious Dresser, Student-PA [SJ] Tanor Glaspy C, PA-C    Procedures  Results for orders placed or performed during the hospital encounter of 11/26/17  Wet prep, genital  Result Value Ref Range   Yeast Wet Prep HPF POC NONE SEEN NONE SEEN   Trich, Wet Prep NONE SEEN NONE SEEN   Clue Cells Wet Prep HPF POC NONE SEEN NONE SEEN   WBC, Wet Prep HPF POC MANY (A) NONE SEEN  Sperm NONE SEEN   Lipase, blood  Result Value Ref Range   Lipase 26 11 - 51 U/L  Comprehensive metabolic panel  Result Value Ref Range   Sodium 136 135 - 145 mmol/L   Potassium 3.5 3.5 - 5.1 mmol/L   Chloride 107 98 - 111 mmol/L   CO2 21 (L) 22 - 32 mmol/L   Glucose, Bld 97 70 - 99 mg/dL   BUN <5 (L) 6 - 20 mg/dL   Creatinine, Ser 0.53 0.44 - 1.00 mg/dL   Calcium 9.0 8.9 - 10.3 mg/dL   Total Protein 7.0 6.5 - 8.1 g/dL   Albumin 3.2 (L) 3.5 - 5.0 g/dL   AST 15 15 - 41 U/L   ALT 17 0 - 44 U/L   Alkaline Phosphatase 85 38 - 126 U/L   Total Bilirubin 0.5 0.3 - 1.2 mg/dL   GFR calc non Af Amer >60 >60 mL/min   GFR calc  Af Amer >60 >60 mL/min   Anion gap 8 5 - 15  CBC  Result Value Ref Range   WBC 10.1 4.0 - 10.5 K/uL   RBC 4.24 3.87 - 5.11 MIL/uL   Hemoglobin 11.1 (L) 12.0 - 15.0 g/dL   HCT 34.3 (L) 36.0 - 46.0 %   MCV 80.9 78.0 - 100.0 fL   MCH 26.2 26.0 - 34.0 pg   MCHC 32.4 30.0 - 36.0 g/dL   RDW 17.1 (H) 11.5 - 15.5 %   Platelets 232 150 - 400 K/uL  Urinalysis, Routine w reflex microscopic  Result Value Ref Range   Color, Urine AMBER (A) YELLOW   APPearance HAZY (A) CLEAR   Specific Gravity, Urine 1.024 1.005 - 1.030   pH 7.0 5.0 - 8.0   Glucose, UA NEGATIVE NEGATIVE mg/dL   Hgb urine dipstick NEGATIVE NEGATIVE   Bilirubin Urine NEGATIVE NEGATIVE   Ketones, ur NEGATIVE NEGATIVE mg/dL   Protein, ur 30 (A) NEGATIVE mg/dL   Nitrite NEGATIVE NEGATIVE   Leukocytes, UA MODERATE (A) NEGATIVE   RBC / HPF 6-10 0 - 5 RBC/hpf   WBC, UA >50 (H) 0 - 5 WBC/hpf   Bacteria, UA RARE (A) NONE SEEN   Squamous Epithelial / LPF 6-10 0 - 5   Mucus PRESENT   hCG, quantitative, pregnancy  Result Value Ref Range   hCG, Beta Chain, Quant, S 24,789 (H) <5 mIU/mL  RPR  Result Value Ref Range   RPR Ser Ql Non Reactive Non Reactive  I-Stat beta hCG blood, ED  Result Value Ref Range   I-stat hCG, quantitative >2,000.0 (H) <5 mIU/mL   Comment 3          ABO/Rh  Result Value Ref Range   ABO/RH(D) O POS    No rh immune globuloin      NOT A RH IMMUNE GLOBULIN CANDIDATE, PT RH POSITIVE Performed at [facility], [address] [place], [place] [address]    Mr Pelvis Wo Contrast  Result Date: 11/26/2017 CLINICAL DATA:  Right lower quadrant pain in the setting of pregnancy. EXAM: MRI ABDOMEN AND PELVIS WITHOUT CONTRAST TECHNIQUE: Multiplanar multisequence MR imaging of the abdomen and pelvis was performed. No intravenous contrast was administered. COMPARISON:  CT scan from 08/01/2005. FINDINGS: COMBINED FINDINGS FOR BOTH MR ABDOMEN AND PELVIS Lower chest: Unremarkable. Hepatobiliary: Liver unremarkable.   Gallbladder surgically absent. Pancreas: No focal mass lesion. No dilatation of the main duct. No intraparenchymal cyst. No peripancreatic edema. Spleen:  No splenomegaly. No focal mass  lesion. Adrenals/Urinary Tract: No adrenal nodule or mass. Kidneys unremarkable. Specifically, no evidence for hydronephrosis. No evidence for hydroureter. Bladder is incompletely visualized. Stomach/Bowel: Stomach is nondistended. No gastric wall thickening. No evidence of outlet obstruction. Duodenum is normally positioned as is the ligament of Treitz. No small bowel wall thickening. No small bowel dilatation. Cecum is located in the right lower quadrant, as suspected. Neither the terminal ileum nor the appendix are discretely identified. No gross edema or inflammation identified around the cecum. Vascular/Lymphatic: No abdominal aortic aneurysm. No abdominal lymphadenopathy. No pelvic lymphadenopathy Reproductive: Single intrauterine gestation identified. Other: A trace amount of free fluid is seen in the right adnexal region and in the cul-de-sac. Musculoskeletal: No abnormal marrow signal within the visualized bony anatomy. IMPRESSION: 1. Appendix is not discretely identified on the current study. Trace amount of fluid is identified in the right adnexal space/cecal tip and in the cul-de-sac, but no gross edema or inflammation evident in the right lower quadrant by MRI. Electronically Signed   By: Misty Stanley M.D.   On: 11/26/2017 08:15   Mr Abdomen Wo Contrast  Result Date: 11/26/2017 CLINICAL DATA:  Right lower quadrant pain in the setting of pregnancy. EXAM: MRI ABDOMEN AND PELVIS WITHOUT CONTRAST TECHNIQUE: Multiplanar multisequence MR imaging of the abdomen and pelvis was performed. No intravenous contrast was administered. COMPARISON:  CT scan from 08/01/2005. FINDINGS: COMBINED FINDINGS FOR BOTH MR ABDOMEN AND PELVIS Lower chest: Unremarkable. Hepatobiliary: Liver unremarkable.  Gallbladder surgically absent.  Pancreas: No focal mass lesion. No dilatation of the main duct. No intraparenchymal cyst. No peripancreatic edema. Spleen:  No splenomegaly. No focal mass lesion. Adrenals/Urinary Tract: No adrenal nodule or mass. Kidneys unremarkable. Specifically, no evidence for hydronephrosis. No evidence for hydroureter. Bladder is incompletely visualized. Stomach/Bowel: Stomach is nondistended. No gastric wall thickening. No evidence of outlet obstruction. Duodenum is normally positioned as is the ligament of Treitz. No small bowel wall thickening. No small bowel dilatation. Cecum is located in the right lower quadrant, as suspected. Neither the terminal ileum nor the appendix are discretely identified. No gross edema or inflammation identified around the cecum. Vascular/Lymphatic: No abdominal aortic aneurysm. No abdominal lymphadenopathy. No pelvic lymphadenopathy Reproductive: Single intrauterine gestation identified. Other: A trace amount of free fluid is seen in the right adnexal region and in the cul-de-sac. Musculoskeletal: No abnormal marrow signal within the visualized bony anatomy. IMPRESSION: 1. Appendix is not discretely identified on the current study. Trace amount of fluid is identified in the right adnexal space/cecal tip and in the cul-de-sac, but no gross edema or inflammation evident in the right lower quadrant by MRI. Electronically Signed   By: Misty Stanley M.D.   On: 11/26/2017 08:15   US Ob Limited  Result Date: 11/26/2017 CLINICAL DATA:  Pregnant patient with right lower quadrant pain. EXAM: LIMITED OBSTETRIC ULTRASOUND FINDINGS: Number of Fetuses: 1 Heart Rate:  131 bpm Movement: Yes Presentation: Variable, very active fetus. Placental Location: Anterior and fundal. Previa: No. Amniotic Fluid (Subjective):  Within normal limits. BPD: 3.2 cm 16 w  0 d MATERNAL FINDINGS: Cervix:  Appears closed. Uterus/Adnexae: There is an avascular 2 x 2.1 x 2.2 cm area within the placenta that may represent  chorionic bleed/hematoma. Neither ovary is visualized, no adnexal mass. IMPRESSION: 1. Single live intrauterine pregnancy estimated gestational age [redacted] weeks 0 days for [facility] 05/13/2018. 2. Rounded avascular 2 cm structure within the placenta may represent a perigestational. Recommend attention to this at follow-up. This exam is performed on an emergent  basis and does not comprehensively evaluate fetal size, dating, or anatomy; follow-up complete OB US should be considered if further fetal assessment is warranted. Electronically Signed   By: Jeb Levering M.D.   On: 11/26/2017 04:43   US Transvaginal Non-ob  Result Date: 11/26/2017 CLINICAL DATA:  Right pelvic pain. EXAM: ULTRASOUND PELVIS TRANSVAGINAL TECHNIQUE: Transvaginal ultrasound examination of the pelvis was performed including evaluation of the uterus, ovaries, adnexal regions, and pelvic cul-de-sac. COMPARISON:  Ultrasound exams from earlier today. FINDINGS: Focused exam was performed in the adnexal regions. Right ovary Measurements: Not visualized.  No adnexal mass. Left ovary Measurements: Not visualized.  No adnexal mass. Other findings:  No abnormal free fluid IMPRESSION: Ovaries could not be identified on endovaginal scanning. Electronically Signed   By: Misty Stanley M.D.   On: 11/26/2017 10:40   US Pelvis Limited (transabdominal Only)  Result Date: 11/26/2017 CLINICAL DATA:  Pregnant patient at [redacted] weeks gestation with severe right pelvic pain, clinical concern for torsion. EXAM: LIMITED ULTRASOUND OF PELVIS TECHNIQUE: Limited transabdominal ultrasound examination of the pelvis was performed to evaluate the ovaries and adnexa regions only. Patient declined transvaginal evaluation. COMPARISON:  Obstetric ultrasound performed earlier this day. FINDINGS: Right ovary Not visualized.  No adnexal mass. Left ovary Not visualized.  No adnexal mass. Doppler evaluation not performed as the ovaries are not identified. IMPRESSION: Ovaries not identified.   No adnexal mass. Electronically Signed   By: Jeb Levering M.D.   On: 11/26/2017 06:53    MDM      Took over patient care from Armstead Peaks, PA-C. Plan: Continue evaluation of right lower quadrant pain.  MRI pending.  Patient is nontoxic appearing, afebrile, not tachycardic on my exams, not tachypneic, not hypotensive, maintains excellent SPO2 on room air, and is in no apparent distress.   Patient initially refused transvaginal ultrasound.  She agreed once analgesics were administered, however, ovaries were not able to be visualized using any of the techniques available.  There was a suspected subchorionic hemorrhage noted on the ultrasound, which may very well be what is causing the patient's pain.   We will treat the patient's urine abnormalities with coverage for possible pyelonephritis. Appendix not clearly seen on MRI.  Because of this, patient will return in 24 hours for repeat abdominal exam.   Pain well controlled at discharge. Strict return precautions were discussed.    Findings and plan of care discussed with Duffy Bruce, MD.   Vitals:   11/26/17 1115 11/26/17 1130 11/26/17 1145 11/26/17 1200  BP:  110/62  117/66  Pulse: (!) 52 (!) 53 (!) 55 (!) 55  Resp:    18  Temp:      TempSrc:      SpO2: 100% 98% 99% 100%      Lorayne Bender, PA-C 11/26/17 1634    Duffy Bruce, MD 11/26/17 1652

## 2017-11-26 NOTE — Discharge Instructions (Addendum)
There were key structures that were unable to be visualized on the imaging studies today.  There was evidence of what appears to be a subchorionic hemorrhage that could be the cause of your pain.  We recommend returning in 24 hours to the emergency department for reexamination.  Should pain or other symptoms significantly worsen, please return sooner.  May take Tylenol for pain.  Reglan for nausea/vomiting.  There is evidence of possible infection in the urine. Please take all of your antibiotics until finished!   You may develop abdominal discomfort or diarrhea from the antibiotic.  You may help offset this with probiotics which you can buy or get in yogurt. Do not eat or take the probiotics until 2 hours after your antibiotic.

## 2017-11-26 NOTE — ED Notes (Signed)
ED PA at bedside

## 2017-11-26 NOTE — ED Provider Notes (Addendum)
[place] EMERGENCY DEPARTMENT Provider Note   CSN: [id number] Arrival date & time: 11/26/17  0031     History   Chief Complaint Chief Complaint  Patient presents with  . Abdominal Pain    HPI Tabitha Holland is a 32 y.o. 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  She reports the pain became severe at 5 PM yesterday.  She reports she has had a dull pain in that area for the past few weeks.  She has had nausea and vomiting today.  She reports some abnormal vaginal discharge for the past couple weeks.  She reports she has had regular periods for the past few months.  She was last sexually active in April 2019.  She denies any fevers, chest pain, shortness of breath, urinary symptoms.  HPI  Past Medical History:  Diagnosis Date  . Hypertension   . Morbid obesity [facility])     Patient Active Problem List   Diagnosis Date Noted  . S/P repeat low transverse C-section 09/17/2015  . Chronic hypertension in obstetric context in third trimester 09/15/2015  . S/P cesarean section 09/15/2015  . S/P C-section 10/28/2013  . Abnormal fetal heart rate or rhythm (FHR) 10/27/2013  . Obesity in pregnancy, antepartum 05/13/2013  . Supervision of other normal pregnancy 05/12/2013  . Active smoker 05/12/2013  . Tobacco smoking complicating pregnancy in second trimester 05/12/2013  . History of gestational hypertension in previous pregnancy 05/12/2013    Past Surgical History:  Procedure Laterality Date  . CESAREAN SECTION N/A 10/28/2013   Procedure: CESAREAN SECTION;  Surgeon: Emily Filbert, MD;  Location: [facility];  Service: Obstetrics;  Laterality: N/A;  . CESAREAN SECTION N/A 09/15/2015   Procedure: CESAREAN SECTION;  Surgeon: Mora Bellman, MD;  Location: [place];  Service: Obstetrics;  Laterality: N/A;  . CHOLECYSTECTOMY    . WISDOM TOOTH EXTRACTION        OB History    Gravida  5   Para  4   Term  4   Preterm  0   AB  0   Living  4     SAB  0   TAB  0   Ectopic  0   Multiple  0   Live Births  4            Home Medications    Prior to Admission medications   Medication Sig Start Date End Date Taking? Authorizing Provider  amLODipine (NORVASC) 5 MG tablet Take 1 tablet (5 mg total) by mouth daily. Patient not taking: Reported on 11/26/2017 09/21/15   [name], MD  cephALEXin (KEFLEX) 500 MG capsule Take 1 capsule (500 mg total) by mouth 4 (four) times daily for 10 days. Patient not taking: Reported on 11/27/2017 11/26/17 12/06/17  Lorayne Bender, PA-C  hydrochlorothiazide (HYDRODIURIL) 25 MG tablet Take 1 tablet (25 mg total) by mouth daily. Patient not taking: Reported on 11/26/2017 09/21/15   [name], MD  ibuprofen (ADVIL,MOTRIN) 600 MG tablet Take 1 tablet (600 mg total) by mouth every 6 (six) hours. Patient not taking: Reported on 11/26/2017 09/17/15   Mercy Riding, MD  metoCLOPramide (REGLAN) 10 MG tablet Take 1 tablet (10 mg total) by mouth every 6 (six) hours. Patient not taking: Reported on 11/27/2017 11/26/17   Lorayne Bender, PA-C  oxyCODONE-acetaminophen (PERCOCET/ROXICET) 5-325 MG tablet Take 1 tablet  by mouth every 4 (four) hours as needed for moderate pain. Patient not taking: Reported on 11/26/2017 09/17/15   Mercy Riding, MD  Prenatal Vit-Fe Fumarate-FA (PRENATAL MULTIVITAMIN) TABS tablet Take 1 tablet by mouth daily at 12 noon. Patient not taking: Reported on 11/26/2017 09/17/15   Mercy Riding, MD  senna-docusate (SENOKOT-S) 8.6-50 MG tablet Take 2 tablets by mouth daily. Patient not taking: Reported on 11/26/2017 09/17/15   Mercy Riding, MD    Family History Family History  Problem Relation Age of Onset  . Hypertension Sister     Social History Social History   Tobacco Use  . Smoking status: Current Every Day Smoker    Packs/day: 0.25    Types: Cigarettes  . Smokeless tobacco: Never  Used  Substance Use Topics  . Alcohol use: Yes  . Drug use: Yes    Types: Marijuana     Allergies   Codeine; Hydrocodone; and Percocet [oxycodone-acetaminophen]   Review of Systems Review of Systems  Constitutional: Negative for chills and fever.  HENT: Negative for facial swelling and sore throat.   Respiratory: Negative for shortness of breath.   Cardiovascular: Negative for chest pain.  Gastrointestinal: Positive for abdominal pain, nausea and vomiting.  Genitourinary: Positive for vaginal discharge. Negative for dysuria.  Musculoskeletal: Positive for back pain.  Skin: Negative for rash and wound.  Neurological: Negative for headaches.  Psychiatric/Behavioral: The patient is not nervous/anxious.      Physical Exam Updated Vital Signs BP 117/66 (BP Location: Right Arm)   Pulse (!) 55   Temp 99 F (37.2 C) (Oral)   Resp 18   LMP 11/12/2017   SpO2 100%   Physical Exam  Constitutional: She appears well-developed and well-nourished. No distress.  HENT:  Head: Normocephalic and atraumatic.  Mouth/Throat: Oropharynx is clear and moist. No oropharyngeal exudate.  Eyes: Pupils are equal, round, and reactive to light. Conjunctivae are normal. Right eye exhibits no discharge. Left eye exhibits no discharge. No scleral icterus.  Neck: Normal range of motion. Neck supple. No thyromegaly present.  Cardiovascular: Normal rate, regular rhythm, normal heart sounds and intact distal pulses. Exam reveals no gallop and no friction rub.  No murmur heard. Pulmonary/Chest: Effort normal and breath sounds normal. No stridor. No respiratory distress. She has no wheezes. She has no rales.  Abdominal: Soft. Bowel sounds are normal. She exhibits no distension. There is tenderness in the right lower quadrant and suprapubic area. There is guarding. There is no rebound and no CVA tenderness.  Genitourinary: Uterus is not tender. Cervix exhibits no motion tenderness. Right adnexum displays no  tenderness. Left adnexum displays no tenderness. Vaginal discharge (white/yellow) found.  Musculoskeletal: She exhibits no edema.  Lymphadenopathy:    She has no cervical adenopathy.  Neurological: She is alert. Coordination normal.  Skin: Skin is warm and dry. No rash noted. She is not diaphoretic. No pallor.  Psychiatric: She has a normal mood and affect.  Nursing note and vitals reviewed.    ED Treatments / Results  Labs (all labs ordered are listed, but only abnormal results are displayed) Labs Reviewed  WET PREP, GENITAL - Abnormal; Notable for the following components:      Result Value   WBC, Wet Prep HPF POC MANY (*)    All other components within normal limits  COMPREHENSIVE METABOLIC PANEL - Abnormal; Notable for the following components:   CO2 21 (*)    BUN <5 (*)    Albumin 3.2 (*)  All other components within normal limits  CBC - Abnormal; Notable for the following components:   Hemoglobin 11.1 (*)    HCT 34.3 (*)    RDW 17.1 (*)    All other components within normal limits  URINALYSIS, ROUTINE W REFLEX MICROSCOPIC - Abnormal; Notable for the following components:   Color, Urine AMBER (*)    APPearance HAZY (*)    Protein, ur 30 (*)    Leukocytes, UA MODERATE (*)    WBC, UA >50 (*)    Bacteria, UA RARE (*)    All other components within normal limits  HCG, QUANTITATIVE, PREGNANCY - Abnormal; Notable for the following components:   hCG, Beta Chain, Quant, S 24,789 (*)    All other components within normal limits  I-STAT BETA HCG BLOOD, ED ([facility], [facility], [facility] ONLY) - Abnormal; Notable for the following components:   I-stat hCG, quantitative >2,000.0 (*)    All other components within normal limits  URINE CULTURE  LIPASE, BLOOD  RPR  HIV ANTIBODY (ROUTINE TESTING)  ABO/RH  GC/CHLAMYDIA PROBE AMP ([place]) NOT AT [facility]    EKG None  Radiology Mr Pelvis Wo Contrast  Result Date: 11/26/2017 CLINICAL DATA:  Right lower quadrant pain in the setting of  pregnancy. EXAM: MRI ABDOMEN AND PELVIS WITHOUT CONTRAST TECHNIQUE: Multiplanar multisequence MR imaging of the abdomen and pelvis was performed. No intravenous contrast was administered. COMPARISON:  CT scan from 08/01/2005. FINDINGS: COMBINED FINDINGS FOR BOTH MR ABDOMEN AND PELVIS Lower chest: Unremarkable. Hepatobiliary: Liver unremarkable.  Gallbladder surgically absent. Pancreas: No focal mass lesion. No dilatation of the main duct. No intraparenchymal cyst. No peripancreatic edema. Spleen:  No splenomegaly. No focal mass lesion. Adrenals/Urinary Tract: No adrenal nodule or mass. Kidneys unremarkable. Specifically, no evidence for hydronephrosis. No evidence for hydroureter. Bladder is incompletely visualized. Stomach/Bowel: Stomach is nondistended. No gastric wall thickening. No evidence of outlet obstruction. Duodenum is normally positioned as is the ligament of Treitz. No small bowel wall thickening. No small bowel dilatation. Cecum is located in the right lower quadrant, as suspected. Neither the terminal ileum nor the appendix are discretely identified. No gross edema or inflammation identified around the cecum. Vascular/Lymphatic: No abdominal aortic aneurysm. No abdominal lymphadenopathy. No pelvic lymphadenopathy Reproductive: Single intrauterine gestation identified. Other: A trace amount of free fluid is seen in the right adnexal region and in the cul-de-sac. Musculoskeletal: No abnormal marrow signal within the visualized bony anatomy. IMPRESSION: 1. Appendix is not discretely identified on the current study. Trace amount of fluid is identified in the right adnexal space/cecal tip and in the cul-de-sac, but no gross edema or inflammation evident in the right lower quadrant by MRI. Electronically Signed   By: Misty Stanley M.D.   On: 11/26/2017 08:15   Mr Abdomen Wo Contrast  Result Date: 11/26/2017 CLINICAL DATA:  Right lower quadrant pain in the setting of pregnancy. EXAM: MRI ABDOMEN AND  PELVIS WITHOUT CONTRAST TECHNIQUE: Multiplanar multisequence MR imaging of the abdomen and pelvis was performed. No intravenous contrast was administered. COMPARISON:  CT scan from 08/01/2005. FINDINGS: COMBINED FINDINGS FOR BOTH MR ABDOMEN AND PELVIS Lower chest: Unremarkable. Hepatobiliary: Liver unremarkable.  Gallbladder surgically absent. Pancreas: No focal mass lesion. No dilatation of the main duct. No intraparenchymal cyst. No peripancreatic edema. Spleen:  No splenomegaly. No focal mass lesion. Adrenals/Urinary Tract: No adrenal nodule or mass. Kidneys unremarkable. Specifically, no evidence for hydronephrosis. No evidence for hydroureter. Bladder is incompletely visualized. Stomach/Bowel: Stomach is nondistended. No gastric wall thickening.  No evidence of outlet obstruction. Duodenum is normally positioned as is the ligament of Treitz. No small bowel wall thickening. No small bowel dilatation. Cecum is located in the right lower quadrant, as suspected. Neither the terminal ileum nor the appendix are discretely identified. No gross edema or inflammation identified around the cecum. Vascular/Lymphatic: No abdominal aortic aneurysm. No abdominal lymphadenopathy. No pelvic lymphadenopathy Reproductive: Single intrauterine gestation identified. Other: A trace amount of free fluid is seen in the right adnexal region and in the cul-de-sac. Musculoskeletal: No abnormal marrow signal within the visualized bony anatomy. IMPRESSION: 1. Appendix is not discretely identified on the current study. Trace amount of fluid is identified in the right adnexal space/cecal tip and in the cul-de-sac, but no gross edema or inflammation evident in the right lower quadrant by MRI. Electronically Signed   By: Misty Stanley M.D.   On: 11/26/2017 08:15   US Ob Limited  Result Date: 11/26/2017 CLINICAL DATA:  Pregnant patient with right lower quadrant pain. EXAM: LIMITED OBSTETRIC ULTRASOUND FINDINGS: Number of Fetuses: 1 Heart  Rate:  131 bpm Movement: Yes Presentation: Variable, very active fetus. Placental Location: Anterior and fundal. Previa: No. Amniotic Fluid (Subjective):  Within normal limits. BPD: 3.2 cm 16 w  0 d MATERNAL FINDINGS: Cervix:  Appears closed. Uterus/Adnexae: There is an avascular 2 x 2.1 x 2.2 cm area within the placenta that may represent chorionic bleed/hematoma. Neither ovary is visualized, no adnexal mass. IMPRESSION: 1. Single live intrauterine pregnancy estimated gestational age [redacted] weeks 0 days for [facility] 05/13/2018. 2. Rounded avascular 2 cm structure within the placenta may represent a perigestational. Recommend attention to this at follow-up. This exam is performed on an emergent basis and does not comprehensively evaluate fetal size, dating, or anatomy; follow-up complete OB US should be considered if further fetal assessment is warranted. Electronically Signed   By: Jeb Levering M.D.   On: 11/26/2017 04:43   US Transvaginal Non-ob  Result Date: 11/26/2017 CLINICAL DATA:  Right pelvic pain. EXAM: ULTRASOUND PELVIS TRANSVAGINAL TECHNIQUE: Transvaginal ultrasound examination of the pelvis was performed including evaluation of the uterus, ovaries, adnexal regions, and pelvic cul-de-sac. COMPARISON:  Ultrasound exams from earlier today. FINDINGS: Focused exam was performed in the adnexal regions. Right ovary Measurements: Not visualized.  No adnexal mass. Left ovary Measurements: Not visualized.  No adnexal mass. Other findings:  No abnormal free fluid IMPRESSION: Ovaries could not be identified on endovaginal scanning. Electronically Signed   By: Misty Stanley M.D.   On: 11/26/2017 10:40   US Pelvis Limited (transabdominal Only)  Result Date: 11/26/2017 CLINICAL DATA:  Pregnant patient at [redacted] weeks gestation with severe right pelvic pain, clinical concern for torsion. EXAM: LIMITED ULTRASOUND OF PELVIS TECHNIQUE: Limited transabdominal ultrasound examination of the pelvis was performed to evaluate the  ovaries and adnexa regions only. Patient declined transvaginal evaluation. COMPARISON:  Obstetric ultrasound performed earlier this day. FINDINGS: Right ovary Not visualized.  No adnexal mass. Left ovary Not visualized.  No adnexal mass. Doppler evaluation not performed as the ovaries are not identified. IMPRESSION: Ovaries not identified.  No adnexal mass. Electronically Signed   By: Jeb Levering M.D.   On: 11/26/2017 06:53    Procedures Procedures (including critical care time)  Medications Ordered in ED Medications  ondansetron (ZOFRAN-ODT) disintegrating tablet 4 mg (4 mg Oral Given 11/26/17 0320)  acetaminophen (TYLENOL) tablet 1,000 mg (1,000 mg Oral Given 11/26/17 0550)  morphine 4 MG/ML injection 4 mg (4 mg Intramuscular Given 11/26/17 0832)  cefTRIAXone (  ROCEPHIN) injection 250 mg (250 mg Intramuscular Given 11/26/17 1225)     Initial Impression / Assessment and Plan / ED Course  I have reviewed the triage vital signs and the nursing notes.  Pertinent labs & imaging results that were available during my care of the patient were reviewed by me and considered in my medical decision making (see chart for details).  Clinical Course as of Nov 27 409  Mon Nov 26, 2017  0658 Spoke with [place] tech. States patient refused transvaginal US.   [SJ]  [phone] Ultrasound tech just call this.  They report that on transabdominal ultrasound, they were unable to visualize the ovaries.  Patient has declined transvaginal ultrasound. She is now an MRI.  MRI tech called [place] and stated that patient will not tolerate MRI, therefore they are sending her back to the ER.  We will talk to the patient about risk and benefits of the MRI test and the risk of giving her IV opioids and-or benzos.    [AN]  [id number] Spoke with the patient about MRI results. We also discussed the limitations of the abdominal ultrasound.  She states she is willing to try the transvaginal ultrasound again now that she has had morphine.    [SJ]  [phone] Spoke with [place] tech. States she will put patient back on the list to have transvaginal US performed. Assured I placed the correct order for patient's circumstance.   [SJ]  [phone] Currently rates her pain 5/10. Still has some tenderness in RLQ.   [SJ]  [id number] MR ABDOMEN WO CONTRAST [NL]    Clinical Course User Index [AN] Varney Biles, MD [NL] Junious Dresser, Student-PA [SJ] Joy, Helane Gunther, PA-C    Patient with severe right lower quadrant pain that began at 5 PM yesterday.  Patient found to have elevated hCG and found to be [redacted] weeks pregnant on ultrasound.  hCG quant 76,734.  GC/chlamydia, HIV, RPR sent.  Wet prep shows many WBCs only.  Patient has unremarkable labs otherwise.  UA shows leukocytes and WBCs, however patient denies urinary symptoms.  Urine culture sent.  Ovaries not visualized and patient refused transvaginal ultrasound secondary to pain.  Considering severe right lower quadrant tenderness, and more of the abdomen was ordered. At shift change, patient care transferred to [facility], PA-C for continued evaluation, follow up of MRI and determination of disposition.  If discharged, patient to follow-up with OB/GYN for prenatal care and begin taking prenatal vitamins.  Patient also evaluated by Dr. Kathrynn Humble who guided the patient's management and agrees with plan.  Final Clinical Impressions(s) / ED Diagnoses   Final diagnoses:  RLQ abdominal pain  Pelvic pain  [redacted] weeks gestation of pregnancy  Subchorionic hemorrhage of placenta in second trimester, single or unspecified fetus    ED Discharge Orders        Ordered    cephALEXin (KEFLEX) 500 MG capsule  2 times daily,   Status:  Discontinued     11/26/17 1154    metoCLOPramide (REGLAN) 10 MG tablet  Every 6 hours     11/26/17 1154    cephALEXin (KEFLEX) 500 MG capsule  4 times daily     11/26/17 1157       Estefan Pattison, [place], PA-C 11/26/17 0744    Frederica Kuster, PA-C 11/27/17 0411    Varney Biles,  MD 11/28/17 [phone]

## 2017-11-26 NOTE — ED Triage Notes (Signed)
C/o pain to lower back that wraps around to abd and is worse on RLQ with cramping since 5:30pm.  Also reports nausea and vomiting.  Denies diarrhea.  Denies urinary complaints.

## 2017-11-26 NOTE — ED Provider Notes (Signed)
[facility] EMERGENCY DEPARTMENT Provider Note   CSN: [id number] Arrival date & time: 11/26/17  1903     History   Chief Complaint Chief Complaint  Patient presents with  . Abdominal Pain    HPI Tabitha Holland is a 32 y.o. female past medical history of hypertension, G5, P4 who is currently [redacted] weeks pregnant who presents for evaluation of worsening right lower quadrant abdominal pain.  Patient was seen here in the ED earlier today for evaluation of symptoms.  Patient had an ultrasound showed that she was approximately [redacted] weeks pregnant with a subchorionic hemorrhage.  Additionally, given patient's complaints of pain, an MRI was ordered for evaluation of acute appendicitis.  At that time, MRI was unable to visualize the appendix.  Patient had no fever, no evidence of leukocytosis.  Her pain was reasonably controlled at that time.  Patient was discharged home with plans for follow-up in 24 hours for repeat abdominal exam.  Patient was discharged home about 1 PM this afternoon.  Patient reports that she went home and continue to have worsening right lower quadrant abdominal pain.  Patient also reports that she started having episodes of vomiting.  She reports 4 episodes of nonbloody, nonbilious vomiting.  She has not been able to eat or drink anything secondary to the pain.  Patient has not had any loss of fluids, vaginal bleeding, chest pain, difficulty breathing.  The history is provided by the patient.    Past Medical History:  Diagnosis Date  . Hypertension   . Morbid obesity [facility])     Patient Active Problem List   Diagnosis Date Noted  . S/P repeat low transverse C-section 09/17/2015  . Chronic hypertension in obstetric context in third trimester 09/15/2015  . S/P cesarean section 09/15/2015  . S/P C-section 10/28/2013  . Abnormal fetal heart rate or rhythm (FHR) 10/27/2013  . Obesity in pregnancy, antepartum 05/13/2013  . Supervision of other normal  pregnancy 05/12/2013  . Active smoker 05/12/2013  . Tobacco smoking complicating pregnancy in second trimester 05/12/2013  . History of gestational hypertension in previous pregnancy 05/12/2013    Past Surgical History:  Procedure Laterality Date  . CESAREAN SECTION N/A 10/28/2013   Procedure: CESAREAN SECTION;  Surgeon: Emily Filbert, MD;  Location: [facility];  Service: Obstetrics;  Laterality: N/A;  . CESAREAN SECTION N/A 09/15/2015   Procedure: CESAREAN SECTION;  Surgeon: Mora Bellman, MD;  Location: [facility];  Service: Obstetrics;  Laterality: N/A;  . CHOLECYSTECTOMY    . WISDOM TOOTH EXTRACTION       OB History    Gravida  5   Para  4   Term  4   Preterm  0   AB  0   Living  4     SAB  0   TAB  0   Ectopic  0   Multiple  0   Live Births  4            Home Medications    Prior to Admission medications   Medication Sig Start Date End Date Taking? Authorizing Provider  amLODipine (NORVASC) 5 MG tablet Take 1 tablet (5 mg total) by mouth daily. Patient not taking: Reported on 11/26/2017 09/21/15   [name], MD  cephALEXin (KEFLEX) 500 MG capsule Take 1 capsule (500 mg total) by mouth 4 (four) times daily for 10 days. Patient not taking: Reported on 11/27/2017 11/26/17 12/06/17  Arlean Hopping C, PA-C  hydrochlorothiazide (HYDRODIURIL)  25 MG tablet Take 1 tablet (25 mg total) by mouth daily. Patient not taking: Reported on 11/26/2017 09/21/15   [name], MD  ibuprofen (ADVIL,MOTRIN) 600 MG tablet Take 1 tablet (600 mg total) by mouth every 6 (six) hours. Patient not taking: Reported on 11/26/2017 09/17/15   Mercy Riding, MD  metoCLOPramide (REGLAN) 10 MG tablet Take 1 tablet (10 mg total) by mouth every 6 (six) hours. Patient not taking: Reported on 11/27/2017 11/26/17   Lorayne Bender, PA-C  oxyCODONE-acetaminophen (PERCOCET/ROXICET) 5-325 MG tablet Take 1 tablet by mouth every 4 (four) hours as needed for moderate pain. Patient not taking: Reported on  11/26/2017 09/17/15   Mercy Riding, MD  Prenatal Vit-Fe Fumarate-FA (PRENATAL MULTIVITAMIN) TABS tablet Take 1 tablet by mouth daily at 12 noon. Patient not taking: Reported on 11/26/2017 09/17/15   Mercy Riding, MD  senna-docusate (SENOKOT-S) 8.6-50 MG tablet Take 2 tablets by mouth daily. Patient not taking: Reported on 11/26/2017 09/17/15   Mercy Riding, MD    Family History Family History  Problem Relation Age of Onset  . Hypertension Sister     Social History Social History   Tobacco Use  . Smoking status: Current Every Day Smoker    Packs/day: 0.25    Types: Cigarettes  . Smokeless tobacco: Never Used  Substance Use Topics  . Alcohol use: Yes  . Drug use: Yes    Types: Marijuana     Allergies   Codeine; Hydrocodone; and Percocet [oxycodone-acetaminophen]   Review of Systems Review of Systems  Constitutional: Negative for fever.  Respiratory: Negative for cough and shortness of breath.   Cardiovascular: Negative for chest pain.  Gastrointestinal: Positive for abdominal pain, nausea and vomiting.  Genitourinary: Negative for dysuria, hematuria and vaginal bleeding.  Neurological: Negative for headaches.  All other systems reviewed and are negative.    Physical Exam Updated Vital Signs BP 112/89   Pulse (!) 56   Temp 97.9 F (36.6 C) (Oral)   Resp 19   Ht 6\' 3"  (1.905 m)   Wt (!) 158.8 kg (350 lb)   LMP  (LMP Unknown) Comment: appox 16wks  SpO2 96%   BMI 43.75 kg/m   Physical Exam  Constitutional: She is oriented to person, place, and time. She appears well-developed and well-nourished.  Appears uncomfortable but no acute distress  HENT:  Head: Normocephalic and atraumatic.  Mouth/Throat: Oropharynx is clear and moist and mucous membranes are normal.  Eyes: Pupils are equal, round, and reactive to light. Conjunctivae, EOM and lids are normal.  Neck: Full passive range of motion without pain.  Cardiovascular: Normal rate, regular rhythm, normal heart  sounds and normal pulses. Exam reveals no gallop and no friction rub.  No murmur heard. Pulmonary/Chest: Effort normal and breath sounds normal.  Abdominal: Soft. Normal appearance. There is tenderness in the right lower quadrant and suprapubic area. There is CVA tenderness (right). There is no rigidity and no guarding.  Abdomen is soft, nondistended.  No rigidity, guarding.  No peritoneal signs.  Patient with tenderness noted to the right lower quadrant suprapubic region.  Negative Rovsing's, rebounding.   Musculoskeletal: Normal range of motion.  Neurological: She is alert and oriented to person, place, and time.  Skin: Skin is warm and dry. Capillary refill takes less than 2 seconds.  Psychiatric: She has a normal mood and affect. Her speech is normal.  Nursing note and vitals reviewed.    ED Treatments / Results  Labs (all  labs ordered are listed, but only abnormal results are displayed) Labs Reviewed  COMPREHENSIVE METABOLIC PANEL - Abnormal; Notable for the following components:      Result Value   Potassium 3.4 (*)    BUN <5 (*)    Calcium 8.8 (*)    Total Protein 6.3 (*)    Albumin 3.0 (*)    All other components within normal limits  CBC WITH DIFFERENTIAL/PLATELET - Abnormal; Notable for the following components:   RBC 3.78 (*)    Hemoglobin 10.1 (*)    HCT 31.5 (*)    RDW 17.0 (*)    All other components within normal limits  LIPASE, BLOOD    EKG None  Radiology Mr Pelvis Wo Contrast  Result Date: 11/26/2017 CLINICAL DATA:  Right lower quadrant pain in the setting of pregnancy. EXAM: MRI ABDOMEN AND PELVIS WITHOUT CONTRAST TECHNIQUE: Multiplanar multisequence MR imaging of the abdomen and pelvis was performed. No intravenous contrast was administered. COMPARISON:  CT scan from 08/01/2005. FINDINGS: COMBINED FINDINGS FOR BOTH MR ABDOMEN AND PELVIS Lower chest: Unremarkable. Hepatobiliary: Liver unremarkable.  Gallbladder surgically absent. Pancreas: No focal mass  lesion. No dilatation of the main duct. No intraparenchymal cyst. No peripancreatic edema. Spleen:  No splenomegaly. No focal mass lesion. Adrenals/Urinary Tract: No adrenal nodule or mass. Kidneys unremarkable. Specifically, no evidence for hydronephrosis. No evidence for hydroureter. Bladder is incompletely visualized. Stomach/Bowel: Stomach is nondistended. No gastric wall thickening. No evidence of outlet obstruction. Duodenum is normally positioned as is the ligament of Treitz. No small bowel wall thickening. No small bowel dilatation. Cecum is located in the right lower quadrant, as suspected. Neither the terminal ileum nor the appendix are discretely identified. No gross edema or inflammation identified around the cecum. Vascular/Lymphatic: No abdominal aortic aneurysm. No abdominal lymphadenopathy. No pelvic lymphadenopathy Reproductive: Single intrauterine gestation identified. Other: A trace amount of free fluid is seen in the right adnexal region and in the cul-de-sac. Musculoskeletal: No abnormal marrow signal within the visualized bony anatomy. IMPRESSION: 1. Appendix is not discretely identified on the current study. Trace amount of fluid is identified in the right adnexal space/cecal tip and in the cul-de-sac, but no gross edema or inflammation evident in the right lower quadrant by MRI. Electronically Signed   By: Misty Stanley M.D.   On: 11/26/2017 08:15   Mr Abdomen Wo Contrast  Result Date: 11/26/2017 CLINICAL DATA:  Right lower quadrant pain in the setting of pregnancy. EXAM: MRI ABDOMEN AND PELVIS WITHOUT CONTRAST TECHNIQUE: Multiplanar multisequence MR imaging of the abdomen and pelvis was performed. No intravenous contrast was administered. COMPARISON:  CT scan from 08/01/2005. FINDINGS: COMBINED FINDINGS FOR BOTH MR ABDOMEN AND PELVIS Lower chest: Unremarkable. Hepatobiliary: Liver unremarkable.  Gallbladder surgically absent. Pancreas: No focal mass lesion. No dilatation of the main  duct. No intraparenchymal cyst. No peripancreatic edema. Spleen:  No splenomegaly. No focal mass lesion. Adrenals/Urinary Tract: No adrenal nodule or mass. Kidneys unremarkable. Specifically, no evidence for hydronephrosis. No evidence for hydroureter. Bladder is incompletely visualized. Stomach/Bowel: Stomach is nondistended. No gastric wall thickening. No evidence of outlet obstruction. Duodenum is normally positioned as is the ligament of Treitz. No small bowel wall thickening. No small bowel dilatation. Cecum is located in the right lower quadrant, as suspected. Neither the terminal ileum nor the appendix are discretely identified. No gross edema or inflammation identified around the cecum. Vascular/Lymphatic: No abdominal aortic aneurysm. No abdominal lymphadenopathy. No pelvic lymphadenopathy Reproductive: Single intrauterine gestation identified. Other: A trace amount of  free fluid is seen in the right adnexal region and in the cul-de-sac. Musculoskeletal: No abnormal marrow signal within the visualized bony anatomy. IMPRESSION: 1. Appendix is not discretely identified on the current study. Trace amount of fluid is identified in the right adnexal space/cecal tip and in the cul-de-sac, but no gross edema or inflammation evident in the right lower quadrant by MRI. Electronically Signed   By: Misty Stanley M.D.   On: 11/26/2017 08:15   US Ob Limited  Result Date: 11/26/2017 CLINICAL DATA:  Pregnant patient with right lower quadrant pain. EXAM: LIMITED OBSTETRIC ULTRASOUND FINDINGS: Number of Fetuses: 1 Heart Rate:  131 bpm Movement: Yes Presentation: Variable, very active fetus. Placental Location: Anterior and fundal. Previa: No. Amniotic Fluid (Subjective):  Within normal limits. BPD: 3.2 cm 16 w  0 d MATERNAL FINDINGS: Cervix:  Appears closed. Uterus/Adnexae: There is an avascular 2 x 2.1 x 2.2 cm area within the placenta that may represent chorionic bleed/hematoma. Neither ovary is visualized, no adnexal  mass. IMPRESSION: 1. Single live intrauterine pregnancy estimated gestational age [redacted] weeks 0 days for [facility] 05/13/2018. 2. Rounded avascular 2 cm structure within the placenta may represent a perigestational. Recommend attention to this at follow-up. This exam is performed on an emergent basis and does not comprehensively evaluate fetal size, dating, or anatomy; follow-up complete OB US should be considered if further fetal assessment is warranted. Electronically Signed   By: Jeb Levering M.D.   On: 11/26/2017 04:43   US Transvaginal Non-ob  Result Date: 11/26/2017 CLINICAL DATA:  Right pelvic pain. EXAM: ULTRASOUND PELVIS TRANSVAGINAL TECHNIQUE: Transvaginal ultrasound examination of the pelvis was performed including evaluation of the uterus, ovaries, adnexal regions, and pelvic cul-de-sac. COMPARISON:  Ultrasound exams from earlier today. FINDINGS: Focused exam was performed in the adnexal regions. Right ovary Measurements: Not visualized.  No adnexal mass. Left ovary Measurements: Not visualized.  No adnexal mass. Other findings:  No abnormal free fluid IMPRESSION: Ovaries could not be identified on endovaginal scanning. Electronically Signed   By: Misty Stanley M.D.   On: 11/26/2017 10:40   US Pelvis Limited (transabdominal Only)  Result Date: 11/26/2017 CLINICAL DATA:  Pregnant patient at [redacted] weeks gestation with severe right pelvic pain, clinical concern for torsion. EXAM: LIMITED ULTRASOUND OF PELVIS TECHNIQUE: Limited transabdominal ultrasound examination of the pelvis was performed to evaluate the ovaries and adnexa regions only. Patient declined transvaginal evaluation. COMPARISON:  Obstetric ultrasound performed earlier this day. FINDINGS: Right ovary Not visualized.  No adnexal mass. Left ovary Not visualized.  No adnexal mass. Doppler evaluation not performed as the ovaries are not identified. IMPRESSION: Ovaries not identified.  No adnexal mass. Electronically Signed   By: Jeb Levering  M.D.   On: 11/26/2017 06:53    Procedures Procedures (including critical care time)  Medications Ordered in ED Medications  ondansetron [facility]) injection 4 mg (4 mg Intravenous Given 11/26/17 2217)  morphine 4 MG/ML injection 4 mg (4 mg Intravenous Given 11/26/17 2217)  acetaminophen (TYLENOL) tablet 1,000 mg (1,000 mg Oral Given 11/27/17 0152)     Initial Impression / Assessment and Plan / ED Course  I have reviewed the triage vital signs and the nursing notes.  Pertinent labs & imaging results that were available during my care of the patient were reviewed by me and considered in my medical decision making (see chart for details).     32 y.o. past medical history of hypertension, G5, P4 who is currently [redacted] weeks pregnant who presents for  evaluation of worsening right lower quadrant abdominal pain.  Seen her earlier today for evaluation pain.  Was found to be pregnant.  Ultrasound showed a small subchorionic hemorrhage. Unable to visualize the ovaries but no adnexal mass noted. Given her right lower quadrant abdominal pain, MRI was ordered for evaluation appendicitis.  Appendix was not visualized but there is no surrounding edema or inflammation.  Patient had improvement in pain was discharged home with plans to follow-up with a repeat abdominal exam 24 hours.  Came to ED tonight because she was having worsening pain associate with nausea/vomiting.  Reports for nonbloody, nonbilious episodes of vomiting since going home.  No vaginal bleeding, loss of fluids. Patient is afebrile, non-toxic appearing, sitting comfortably on examination table. Vital signs reviewed and stable.  On exam, patient has tenderness noted to the right lower quadrant.  She does exhibit some mild right-sided CVA tenderness.  Otherwise exam unremarkable. Question if this is viral GI vs round ligament pain vs nausea/vomiting of pregnancy. Do not suspect ovarian torsion given history/physical exam. Plan to Check basic labs.   Patient was pain controlled with Tylenol earlier.  Will give a small course of pain medication.  Discussed risks benefits pain medication with patient.  CBC shows no leukocytosis.  CMP with slight hypokalemia at 3.4.  No abnormalities BUN/creatinine.  Lipase unremarkable.    Re-Evaluation after pain medications.  She does report some improvement in pain.  Repeat abdominal pain slightly improved.  We will plan to p.o. Challenge.  Patient able to tolerate Sprite.  She had some returning nausea after eating a sandwich. No vomiting.    Reevaluation.  Patient still having some tenderness in right lower quadrant.  Patient with no fever at this time.  No evidence of leukocytosis on exam.  Labs are reassuring.  Evaluation of her MRI showed there is no surrounding edema, inflammation that could be concerning of appendicitis. Patient's workup does not warrant admission at this time. Discussed patient with Dr. Wilson Singer who is agreeable. At this time, this is not sound like appendicitis but given that this is her second visit with continued pain, will consult OB/GYN for evaluation.  Discussed patient with Dr. Ilda Basset (OB/GYN).  Given Patient presentation, no indication for [facility] at [facility] time. This does not sound like typical pyelo.  Reassuring work-up here in the ED.  He will plan to get an appointment either today or tomorrow  so she can be evaluated.  Updated patient on plan.  She is sleeping comfortably on examination table in no signs of distress.  Woke patient up to discuss repeat plan with her.  She is agreeable. She is able to sleep comfortably with no signs of distress.  Instructed patient to take her antibiotics and Zofran as directed.  Encouraged her to closely monitor symptoms and return to the emergency department.  She was able to eat as [place] sandwich and drinking Sprite without any difficulty.  No peritoneal signs on exam. Patient had ample opportunity for questions and discussion. All  patient's questions were answered with full understanding. Strict return precautions discussed. Patient expresses understanding and agreement to plan.   Final Clinical Impressions(s) / ED Diagnoses   Final diagnoses:  Right lower quadrant abdominal pain    ED Discharge Orders    None        Desma Mcgregor 11/27/17 0224    Virgel Manifold, MD 11/30/17 1719

## 2017-11-26 NOTE — ED Triage Notes (Signed)
Pt was seen and left this afternoon for abd pain, had [place] and MRI done, states that she vomited x 4 since she left.

## 2017-11-26 NOTE — ED Notes (Signed)
D/c reviewed with patient 

## 2017-11-26 NOTE — ED Notes (Signed)
Delay in lab draw,  Provider at bedside. 

## 2017-11-27 LAB — URINE CULTURE

## 2017-11-27 MED ORDER — ACETAMINOPHEN 500 MG PO TABS
1000.0000 mg | ORAL_TABLET | Freq: Once | ORAL | Status: AC
  Administered 2017-11-27: 1000 mg via ORAL

## 2017-11-27 NOTE — Discharge Instructions (Addendum)
You can take 1000 mg of Tylenol.  Do not exceed 4000 mg of Tylenol a day.  Take the antibiotic and the nausea medication as directed.  [facility] for you either later today or tomorrow.  Please go to the appointment as directed.  Closely monitor your symptoms.  Return for any worsening pain, persistent vomiting, fever, chest pain, difficulty breathing.

## 2017-11-27 NOTE — ED Notes (Signed)
Patient able to drink fluids, still nauseated when eating.

## 2017-11-27 NOTE — ED Notes (Signed)
Patient Alert and oriented to baseline. Stable and ambulatory to baseline. Patient verbalized understanding of the discharge instructions.  Patient belongings were taken by the patient.   

## 2017-11-29 ENCOUNTER — Other Ambulatory Visit: Payer: Self-pay

## 2017-11-29 ENCOUNTER — Emergency Department (HOSPITAL_COMMUNITY)
Admission: EM | Admit: 2017-11-29 | Discharge: 2017-11-29 | Disposition: A | Discharge status: Home / Self Care | Payer: Self-pay | Attending: Emergency Medicine | Admitting: Emergency Medicine

## 2017-11-29 ENCOUNTER — Encounter (HOSPITAL_COMMUNITY): Payer: Self-pay

## 2017-11-29 DIAGNOSIS — F17210 Nicotine dependence, cigarettes, uncomplicated: Secondary | ICD-10-CM | POA: Insufficient documentation

## 2017-11-29 DIAGNOSIS — R1031 Right lower quadrant pain: Secondary | ICD-10-CM | POA: Insufficient documentation

## 2017-11-29 DIAGNOSIS — I10 Essential (primary) hypertension: Secondary | ICD-10-CM | POA: Insufficient documentation

## 2017-11-29 DIAGNOSIS — N390 Urinary tract infection, site not specified: Secondary | ICD-10-CM

## 2017-11-29 DIAGNOSIS — Z79899 Other long term (current) drug therapy: Secondary | ICD-10-CM | POA: Insufficient documentation

## 2017-11-29 LAB — COMPREHENSIVE METABOLIC PANEL
ALBUMIN: 3.1 g/dL — AB (ref 3.5–5.0)
ALT: 14 U/L (ref 0–44)
AST: 13 U/L — AB (ref 15–41)
Alkaline Phosphatase: 78 U/L (ref 38–126)
Anion gap: 8 (ref 5–15)
BUN: 5 mg/dL — AB (ref 6–20)
CHLORIDE: 109 mmol/L (ref 98–111)
CO2: 22 mmol/L (ref 22–32)
Calcium: 8.8 mg/dL — ABNORMAL LOW (ref 8.9–10.3)
Creatinine, Ser: 0.56 mg/dL (ref 0.44–1.00)
GFR calc Af Amer: >60 mL/min (ref >60)
Glucose, Bld: 85 mg/dL (ref 70–99)
Potassium: 3.9 mmol/L (ref 3.5–5.1)
Sodium: 139 mmol/L (ref 135–145)
Total Bilirubin: 0.4 mg/dL (ref 0.3–1.2)
Total Protein: 6.5 g/dL (ref 6.5–8.1)

## 2017-11-29 LAB — CBC
HEMATOCRIT: 33.9 % — AB (ref 36.0–46.0)
Hemoglobin: 10.8 g/dL — ABNORMAL LOW (ref 12.0–15.0)
MCH: 26.7 pg (ref 26.0–34.0)
MCHC: 31.9 g/dL (ref 30.0–36.0)
MCV: 83.9 fL (ref 78.0–100.0)
Platelets: 213 10*3/uL (ref 150–400)
RBC: 4.04 MIL/uL (ref 3.87–5.11)
RDW: 17.2 % — AB (ref 11.5–15.5)
WBC: 8.1 10*3/uL (ref 4.0–10.5)

## 2017-11-29 LAB — URINALYSIS, ROUTINE W REFLEX MICROSCOPIC
BILIRUBIN URINE: NEGATIVE
Glucose, UA: NEGATIVE mg/dL
Ketones, ur: NEGATIVE mg/dL
Nitrite: NEGATIVE
Protein, ur: NEGATIVE mg/dL
SPECIFIC GRAVITY, URINE: 1.018 (ref 1.005–1.030)
pH: 7 (ref 5.0–8.0)

## 2017-11-29 LAB — I-STAT BETA HCG BLOOD, ED (MC, WL, AP ONLY): I-stat hCG, quantitative: >2000 m[IU]/mL — ABNORMAL HIGH (ref <5)

## 2017-11-29 LAB — RAPID URINE DRUG SCREEN, HOSP PERFORMED
AMPHETAMINES: NOT DETECTED
BENZODIAZEPINES: NOT DETECTED
Barbiturates: NOT DETECTED
COCAINE: NOT DETECTED
OPIATES: NOT DETECTED
Tetrahydrocannabinol: POSITIVE — AB

## 2017-11-29 LAB — HCG, QUANTITATIVE, PREGNANCY: hCG, Beta Chain, Quant, S: 28822 m[IU]/mL — ABNORMAL HIGH (ref <5)

## 2017-11-29 LAB — LIPASE, BLOOD: Lipase: 26 U/L (ref 11–51)

## 2017-11-29 MED ORDER — ACETAMINOPHEN 325 MG PO TABS
650.0000 mg | ORAL_TABLET | Freq: Four times a day (QID) | ORAL | Status: DC | PRN
  Filled 2017-11-29: qty 2

## 2017-11-29 MED ORDER — SODIUM CHLORIDE 0.9 % IV SOLN
1000.0000 mL | INTRAVENOUS | Status: DC
  Administered 2017-11-29: 1000 mL via INTRAVENOUS

## 2017-11-29 MED ORDER — METOCLOPRAMIDE HCL 10 MG PO TABS: 10.0000 mg | ORAL_TABLET | Freq: Four times a day (QID) | ORAL | 0 refills | Status: DC

## 2017-11-29 MED ORDER — SODIUM CHLORIDE 0.9 % IV SOLN
1.0000 g | Freq: Once | INTRAVENOUS | Status: AC
  Administered 2017-11-29: 1 g via INTRAVENOUS
  Filled 2017-11-29: qty 10

## 2017-11-29 MED ORDER — SODIUM CHLORIDE 0.9 % IV BOLUS (SEPSIS)
1000.0000 mL | Freq: Once | INTRAVENOUS | Status: AC
  Administered 2017-11-29: 1000 mL via INTRAVENOUS

## 2017-11-29 NOTE — ED Provider Notes (Signed)
[facility] EMERGENCY DEPARTMENT Provider Note   CSN: [id number] Arrival date & time: 11/29/17  1313     History   Chief Complaint Chief Complaint  Patient presents with  . Abdominal Pain    HPI Tabitha Holland is a 33 y.o. female with a history of HTN and morbid obesity who presents to the emergency department with a chief complaint of right flank pain.  The patient endorses right flank pain that starts in her right low back and radiates around to the right lower quadrant and suprapubic regions and intermittently as severe sharp pain to her right groin and down her right thigh.  She reports moderate constant pain that she describes as "dragging or scraping" and intermittent severe pain that is sharp.  She also endorses daily emesis since onset of pain.  She had 2 episodes of emesis today and was concerned that one appeared dark black in color.  She denies nausea at this time.  She reports that she was seen for the same pain on 11/26/2017.  She was found to be 16 weeks and 0 days pregnant.  She had an MRI of the abdomen during her previous visit to rule out appendicitis, but the appendix was not visualized.  She was started on Keflex, but feels as if she has not been able to actually keep any of the doses down due to vomiting.  She denies fever, chills, dysuria, hematuria, vaginal pain, itching, discharge, or bleeding, chest pain, or dyspnea.  The history is provided by the patient. No language interpreter was used.    Past Medical History:  Diagnosis Date  . Hypertension   . Morbid obesity [facility])     Patient Active Problem List   Diagnosis Date Noted  . S/P repeat low transverse C-section 09/17/2015  . Chronic hypertension in obstetric context in third trimester 09/15/2015  . S/P cesarean section 09/15/2015  . S/P C-section 10/28/2013  . Abnormal fetal heart rate or rhythm (FHR) 10/27/2013  . Obesity in pregnancy, antepartum 05/13/2013  . Supervision of  other normal pregnancy 05/12/2013  . Active smoker 05/12/2013  . Tobacco smoking complicating pregnancy in second trimester 05/12/2013  . History of gestational hypertension in previous pregnancy 05/12/2013    Past Surgical History:  Procedure Laterality Date  . CESAREAN SECTION N/A 10/28/2013   Procedure: CESAREAN SECTION;  Surgeon: Emily Filbert, MD;  Location: [facility];  Service: Obstetrics;  Laterality: N/A;  . CESAREAN SECTION N/A 09/15/2015   Procedure: CESAREAN SECTION;  Surgeon: Mora Bellman, MD;  Location: [facility];  Service: Obstetrics;  Laterality: N/A;  . CHOLECYSTECTOMY    . WISDOM TOOTH EXTRACTION       OB History    Gravida  5   Para  4   Term  4   Preterm  0   AB  0   Living  4     SAB  0   TAB  0   Ectopic  0   Multiple  0   Live Births  4            Home Medications    Prior to Admission medications   Medication Sig Start Date End Date Taking? Authorizing Provider  acetaminophen (TYLENOL) 325 MG tablet Take 325 mg by mouth every 6 (six) hours as needed for moderate pain.   Yes [provider]  cephALEXin (KEFLEX) 500 MG capsule Take 1 capsule (500 mg total) by mouth 4 (four) times daily for 10  days. 11/26/17 12/06/17 Yes Joy, Shawn C, PA-C  amLODipine (NORVASC) 5 MG tablet Take 1 tablet (5 mg total) by mouth daily. Patient not taking: Reported on 11/26/2017 09/21/15   [name], MD  hydrochlorothiazide (HYDRODIURIL) 25 MG tablet Take 1 tablet (25 mg total) by mouth daily. Patient not taking: Reported on 11/26/2017 09/21/15   [name], MD  ibuprofen (ADVIL,MOTRIN) 600 MG tablet Take 1 tablet (600 mg total) by mouth every 6 (six) hours. Patient not taking: Reported on 11/26/2017 09/17/15   Mercy Riding, MD  metoCLOPramide (REGLAN) 10 MG tablet Take 1 tablet (10 mg total) by mouth every 6 (six) hours. Patient not taking: Reported on 11/27/2017 11/26/17   Lorayne Bender, PA-C  oxyCODONE-acetaminophen (PERCOCET/ROXICET) 5-325 MG  tablet Take 1 tablet by mouth every 4 (four) hours as needed for moderate pain. Patient not taking: Reported on 11/26/2017 09/17/15   Mercy Riding, MD  Prenatal Vit-Fe Fumarate-FA (PRENATAL MULTIVITAMIN) TABS tablet Take 1 tablet by mouth daily at 12 noon. Patient not taking: Reported on 11/26/2017 09/17/15   Mercy Riding, MD  senna-docusate (SENOKOT-S) 8.6-50 MG tablet Take 2 tablets by mouth daily. Patient not taking: Reported on 11/26/2017 09/17/15   Mercy Riding, MD    Family History Family History  Problem Relation Age of Onset  . Hypertension Sister     Social History Social History   Tobacco Use  . Smoking status: Current Every Day Smoker    Packs/day: 0.25    Types: Cigarettes  . Smokeless tobacco: Never Used  Substance Use Topics  . Alcohol use: Yes  . Drug use: Yes    Types: Marijuana     Allergies   Codeine; Hydrocodone; and Percocet [oxycodone-acetaminophen]   Review of Systems Review of Systems  Constitutional: Negative for activity change, chills and fever.  Respiratory: Negative for shortness of breath and wheezing.   Cardiovascular: Negative for chest pain, palpitations and leg swelling.  Gastrointestinal: Positive for abdominal pain, nausea and vomiting. Negative for constipation and diarrhea.  Genitourinary: Positive for flank pain. Negative for dysuria, vaginal bleeding and vaginal pain.  Musculoskeletal: Positive for back pain. Negative for myalgias and neck stiffness.  Skin: Negative for rash.  Allergic/Immunologic: Negative for immunocompromised state.  Neurological: Negative for weakness, numbness and headaches.  Psychiatric/Behavioral: Negative for confusion.   Physical Exam Updated Vital Signs BP (!) 149/90 (BP Location: Right Arm)   Pulse 84   Temp 98.1 F (36.7 C) (Oral)   Resp 16   Ht 6\' 3"  (1.905 m)   Wt (!) 158.8 kg (350 lb)   LMP  (LMP Unknown) Comment: appox 16wks  SpO2 100%   BMI 43.75 kg/m   Physical Exam  Constitutional: No  distress.  HENT:  Head: Normocephalic.  No dried blood in the posterior oropharynx.  Eyes: Conjunctivae are normal.  Neck: Neck supple.  Cardiovascular: Normal rate, regular rhythm, normal heart sounds and intact distal pulses. Exam reveals no gallop and no friction rub.  No murmur heard. Pulmonary/Chest: Effort normal. No stridor. No respiratory distress. She has no wheezes. She has no rales. She exhibits no tenderness.  Abdominal: Soft. She exhibits no distension and no mass. There is tenderness. There is no rebound and no guarding. No hernia.  Obese abdomen.  Abdomen is soft, nondistended.  Reproducible tenderness to palpation in the suprapubic, right lower quadrant, and right CVA that extends inferiorly to the right ASIS.  No rebound or guarding.  She is also tender to  palpation in the right groin.  No inguinal lymphadenopathy.  No peritoneal signs.  Negative Murphy sign.  Musculoskeletal: Normal range of motion. She exhibits no edema, tenderness or deformity.  Neurological: She is alert.  Skin: Skin is warm. No rash noted.  Psychiatric: Her behavior is normal.  Nursing note and vitals reviewed.  ED Treatments / Results  Labs (all labs ordered are listed, but only abnormal results are displayed) Labs Reviewed  COMPREHENSIVE METABOLIC PANEL - Abnormal; Notable for the following components:      Result Value   BUN 5 (*)    Calcium 8.8 (*)    Albumin 3.1 (*)    AST 13 (*)    All other components within normal limits  CBC - Abnormal; Notable for the following components:   Hemoglobin 10.8 (*)    HCT 33.9 (*)    RDW 17.2 (*)    All other components within normal limits  HCG, QUANTITATIVE, PREGNANCY - Abnormal; Notable for the following components:   hCG, Beta Chain, Quant, S 28,822 (*)    All other components within normal limits  I-STAT BETA HCG BLOOD, ED ([facility], [facility], [facility] ONLY) - Abnormal; Notable for the following components:   I-stat hCG, quantitative >2,000.0 (*)    All other  components within normal limits  LIPASE, BLOOD  URINALYSIS, ROUTINE W REFLEX MICROSCOPIC    EKG None  Radiology No results found.  Procedures Procedures (including critical care time)  Medications Ordered in ED Medications - No data to display   Initial Impression / Assessment and Plan / ED Course  I have reviewed the triage vital signs and the nursing notes.  Pertinent labs & imaging results that were available during my care of the patient were reviewed by me and considered in my medical decision making (see chart for details).     32 year old female with a history of HTN and morbid obesity presenting with continued right-sided abdominal and flank pain with nausea and emesis.  She had one episode of dark emesis earlier today concerning for hematemesis.  At this time, she denies nausea and has not had an episode of emesis in several hours.  On exam, she is tender to palpation in the suprapubic, right lower quadrant, right flank and right CVA with some right groin tenderness.  No peritoneal signs.  Patient declines Tylenol for pain control at this time as she states that it does not work.  Quantitative hCG increased from previous.  Labs are otherwise reassuring.  UA is pending at this time.  Given her exam and work-up, suspect more nephrolithiasis or pyelonephritis versus appendicitis.  Awaiting urinalysis results. Patient care transferred to [place] at the end of my shift. Patient presentation, ED course, and plan of care discussed with review of all pertinent labs and imaging. Please see his/her note for further details regarding further ED course and disposition.   Final Clinical Impressions(s) / ED Diagnoses   Final diagnoses:  None    ED Discharge Orders    None       Joanne Gavel, PA-C 11/29/17 1610    Julianne Rice, MD 12/01/17 1558

## 2017-11-29 NOTE — Discharge Instructions (Signed)
Continue antibiotics.  Tylenol for pain.  Return if any problems.  Recheck at [facility].

## 2017-11-29 NOTE — ED Provider Notes (Addendum)
Pt turned over to me 4pm.  UA pending.  Pt has been prescribed antibiotics for uti but she has not been able to take due to nausea and vomitting.   Pt had MRI and Ultrasound 2 days ago.  Pt has normal wbc count.  Pt remains afebrile.  No vomiting here. Pt given Iv fluids and Rocephin.  No vomiting.  Pt reports she still feels bad.  I discussed option of abmission.  Pt began cursing and name calling.  Pt demanding pain medication.  Pt allergic to hydrocodone, percocet and codeine.  Pt offer tylenol.    I think pt is stable to continue outpatient treatment.   Pt is advised to establish prenatal care.  Go to [facility] maternity admissions  if continued nausea and vomitting.  [facility] if suppose to call with appointment time.    Fransico Meadow, PA-C 11/29/17 1933    Fransico Meadow, PA-C 11/29/17 1946    Fransico Meadow, PA-C 11/29/17 1948    Merrily Pew, MD 12/01/17 [phone]

## 2017-11-29 NOTE — ED Provider Notes (Signed)
Patient placed in Quick Look pathway, seen and evaluated   Chief Complaint: Abdominal pain, nausea, vomiting  HPI:   Patient returns after being evaluated on 11/26/2017 for right lower quadrant pain.  Patient was found to be [redacted] weeks pregnant and she was not aware.  Patient had an MRI of the abdomen to rule out appendicitis, however the appendix was not visualized.  Patient continues to have severe right lower quadrant pain and reports she feels like fluid is radiating from her right lower quadrant to her leg, although she denies any vaginal discharge or bleeding.  She continues to have nausea and vomiting.  She has been taking Tylenol with minimal relief.  ROS: + Abdominal pain, nausea, vomiting -vaginal discharge or bleeding, chest pain or shortness of breath  Physical Exam:   Gen: No distress  Neuro: Awake and Alert  Skin: Warm    Focused Exam: Right lower quadrant tenderness, lungs clear, heart normal rate and rhythm   Initiation of care has begun. The patient has been counseled on the process, plan, and necessity for staying for the completion/evaluation, and the remainder of the medical screening examination    Frederica Kuster, PA-C 11/29/17 Van, DO 11/29/17 1537

## 2017-11-29 NOTE — ED Triage Notes (Signed)
Pt states that she was seen Tuesday for appendicitis, states that her pain has gotten worse and the today she threw up "black liquid" denies having anything to eat since Tuesday.

## 2017-12-24 ENCOUNTER — Telehealth: Payer: Self-pay | Admitting: Obstetrics & Gynecology

## 2017-12-24 ENCOUNTER — Ambulatory Visit: Payer: Self-pay | Admitting: Obstetrics & Gynecology

## 2017-12-24 NOTE — Telephone Encounter (Signed)
Called patient because she had an appointment today for GYN care. Dr Roselie Awkward stated the patient was pregnant, and is scheduled for GYN care. I called the patient to ask her about her appointment. She stated she is no longer pregnant, but would not be able to come because her children had appointments and she is on her way out the door. She did not want to reschedule at this time, but would call back.

## 2018-04-03 ENCOUNTER — Other Ambulatory Visit: Payer: Self-pay

## 2018-04-03 ENCOUNTER — Encounter (HOSPITAL_COMMUNITY): Payer: Self-pay | Admitting: *Deleted

## 2018-04-03 ENCOUNTER — Emergency Department (HOSPITAL_COMMUNITY)
Admission: EM | Admit: 2018-04-03 | Discharge: 2018-04-03 | Discharge status: Against Medical Advice | Payer: Self-pay | Attending: Emergency Medicine | Admitting: Emergency Medicine

## 2018-04-03 ENCOUNTER — Emergency Department (HOSPITAL_COMMUNITY): Payer: Self-pay

## 2018-04-03 DIAGNOSIS — Z3A33 33 weeks gestation of pregnancy: Secondary | ICD-10-CM | POA: Insufficient documentation

## 2018-04-03 DIAGNOSIS — O1493 Unspecified pre-eclampsia, third trimester: Secondary | ICD-10-CM | POA: Insufficient documentation

## 2018-04-03 DIAGNOSIS — O26893 Other specified pregnancy related conditions, third trimester: Secondary | ICD-10-CM | POA: Insufficient documentation

## 2018-04-03 DIAGNOSIS — Z5320 Procedure and treatment not carried out because of patient's decision for unspecified reasons: Secondary | ICD-10-CM | POA: Insufficient documentation

## 2018-04-03 DIAGNOSIS — R112 Nausea with vomiting, unspecified: Secondary | ICD-10-CM | POA: Insufficient documentation

## 2018-04-03 DIAGNOSIS — R51 Headache: Secondary | ICD-10-CM | POA: Insufficient documentation

## 2018-04-03 LAB — BASIC METABOLIC PANEL
ANION GAP: 7 (ref 5–15)
BUN: <5 mg/dL — ABNORMAL LOW (ref 6–20)
CO2: 22 mmol/L (ref 22–32)
Calcium: 8.5 mg/dL — ABNORMAL LOW (ref 8.9–10.3)
Chloride: 106 mmol/L (ref 98–111)
Creatinine, Ser: 0.49 mg/dL (ref 0.44–1.00)
GFR calc Af Amer: >60 mL/min (ref >60)
GLUCOSE: 76 mg/dL (ref 70–99)
POTASSIUM: 3.5 mmol/L (ref 3.5–5.1)
SODIUM: 135 mmol/L (ref 135–145)

## 2018-04-03 LAB — CBC WITH DIFFERENTIAL/PLATELET
Abs Immature Granulocytes: 0.03 10*3/uL (ref 0.00–0.07)
BASOS ABS: 0 10*3/uL (ref 0.0–0.1)
Basophils Relative: 0 %
Eosinophils Absolute: 0 10*3/uL (ref 0.0–0.5)
Eosinophils Relative: 0 %
HCT: 30.6 % — ABNORMAL LOW (ref 36.0–46.0)
HEMOGLOBIN: 9.7 g/dL — AB (ref 12.0–15.0)
Immature Granulocytes: 0 %
LYMPHS PCT: 19 %
Lymphs Abs: 1.8 10*3/uL (ref 0.7–4.0)
MCH: 27.1 pg (ref 26.0–34.0)
MCHC: 31.7 g/dL (ref 30.0–36.0)
MCV: 85.5 fL (ref 80.0–100.0)
Monocytes Absolute: 0.5 10*3/uL (ref 0.1–1.0)
Monocytes Relative: 5 %
NEUTROS ABS: 7 10*3/uL (ref 1.7–7.7)
NEUTROS PCT: 76 %
PLATELETS: 224 10*3/uL (ref 150–400)
RBC: 3.58 MIL/uL — AB (ref 3.87–5.11)
RDW: 12.9 % (ref 11.5–15.5)
WBC: 9.3 10*3/uL (ref 4.0–10.5)
nRBC: 0 % (ref 0.0–0.2)

## 2018-04-03 LAB — HEPATIC FUNCTION PANEL
ALT: 8 U/L (ref 0–44)
AST: 12 U/L — ABNORMAL LOW (ref 15–41)
Albumin: <1 g/dL — ABNORMAL LOW (ref 3.5–5.0)
Alkaline Phosphatase: 115 U/L (ref 38–126)
Bilirubin, Direct: <0.1 mg/dL (ref 0.0–0.2)
Total Bilirubin: 0.5 mg/dL (ref 0.3–1.2)

## 2018-04-03 LAB — I-STAT BETA HCG BLOOD, ED (MC, WL, AP ONLY)

## 2018-04-03 LAB — CK: CK TOTAL: 35 U/L — AB (ref 38–234)

## 2018-04-03 LAB — HCG, QUANTITATIVE, PREGNANCY: HCG, BETA CHAIN, QUANT, S: 19814 m[IU]/mL — AB (ref <5)

## 2018-04-03 MED ORDER — SODIUM CHLORIDE 0.9 % IV BOLUS
1000.0000 mL | Freq: Once | INTRAVENOUS | Status: AC
  Administered 2018-04-03: 1000 mL via INTRAVENOUS

## 2018-04-03 MED ORDER — KETOROLAC TROMETHAMINE 30 MG/ML IJ SOLN
30.0000 mg | Freq: Once | INTRAMUSCULAR | Status: AC
  Administered 2018-04-03: 30 mg via INTRAVENOUS
  Filled 2018-04-03: qty 1

## 2018-04-03 MED ORDER — LABETALOL HCL 5 MG/ML IV SOLN
10.0000 mg | Freq: Once | INTRAVENOUS | Status: AC
  Administered 2018-04-03: 10 mg via INTRAVENOUS
  Filled 2018-04-03: qty 4

## 2018-04-03 MED ORDER — PROMETHAZINE HCL 25 MG/ML IJ SOLN
25.0000 mg | Freq: Once | INTRAMUSCULAR | Status: AC
  Administered 2018-04-03: 25 mg via INTRAVENOUS
  Filled 2018-04-03: qty 1

## 2018-04-03 MED ORDER — DEXAMETHASONE SODIUM PHOSPHATE 10 MG/ML IJ SOLN
4.0000 mg | Freq: Once | INTRAMUSCULAR | Status: AC
  Administered 2018-04-03: 4 mg via INTRAVENOUS
  Filled 2018-04-03: qty 1

## 2018-04-03 NOTE — ED Notes (Signed)
The pt is alert and she is  [facility]  She has told dr Darl Householder that she will sign out ama

## 2018-04-03 NOTE — ED Triage Notes (Signed)
The pt arrived by [facility] from home  Migraine headache for 2-3 days with nausea and vomiting  She usually  Takes tylenol

## 2018-04-03 NOTE — ED Provider Notes (Signed)
[facility] Provider Note   CSN: [id number] Arrival date & time: 04/03/18  1831     History   Chief Complaint Chief Complaint  Patient presents with  . Headache    HPI Tabitha [facility] is a 32 y.o. female hx of HTN, migraines, here presenting with nausea vomiting, headaches.  Patient states that she does have chronic migraines.  She states that for the last 2 to 3 days her headache got worse.  Vision states that she has diffuse headaches and associated with some chills and body aches.  Patient states that she has photophobia and phonophobia as well.  Has some nausea and vomiting as well.  Took Tylenol or Motrin which didn't help.   The history is provided by the patient.    Past Medical History:  Diagnosis Date  . Hypertension   . Morbid obesity [facility])     Patient Active Problem List   Diagnosis Date Noted  . S/P repeat low transverse C-section 09/17/2015  . Chronic hypertension in obstetric context in third trimester 09/15/2015  . S/P cesarean section 09/15/2015  . S/P C-section 10/28/2013  . Abnormal fetal heart rate or rhythm (FHR) 10/27/2013  . Obesity in pregnancy, antepartum 05/13/2013  . Supervision of other normal pregnancy 05/12/2013  . Active smoker 05/12/2013  . Tobacco smoking complicating pregnancy in second trimester 05/12/2013  . History of gestational hypertension in previous pregnancy 05/12/2013    Past Surgical History:  Procedure Laterality Date  . CESAREAN SECTION N/A 10/28/2013   Procedure: CESAREAN SECTION;  Surgeon: Emily Filbert, MD;  Location: [facility];  Service: Obstetrics;  Laterality: N/A;  . CESAREAN SECTION N/A 09/15/2015   Procedure: CESAREAN SECTION;  Surgeon: Mora Bellman, MD;  Location: [facility];  Service: Obstetrics;  Laterality: N/A;  . CHOLECYSTECTOMY    . WISDOM TOOTH EXTRACTION       OB History    Gravida  5   Para  4   Term  4   Preterm  0   AB  0   Living  4     SAB   0   TAB  0   Ectopic  0   Multiple  0   Live Births  4            Home Medications    Prior to Admission medications   Medication Sig Start Date End Date Taking? Authorizing Provider  acetaminophen (TYLENOL) 325 MG tablet Take 325 mg by mouth every 6 (six) hours as needed for moderate pain.    [provider]  metoCLOPramide (REGLAN) 10 MG tablet Take 1 tablet (10 mg total) by mouth every 6 (six) hours. 11/29/17   Fransico Meadow, PA-C    Family History Family History  Problem Relation Age of Onset  . Hypertension Sister     Social History Social History   Tobacco Use  . Smoking status: Current Every Day Smoker    Packs/day: 0.25    Types: Cigarettes  . Smokeless tobacco: Never Used  Substance Use Topics  . Alcohol use: Yes  . Drug use: Yes    Types: Marijuana     Allergies   Codeine; Hydrocodone; and Percocet [oxycodone-acetaminophen]   Review of Systems Review of Systems  Neurological: Positive for headaches.  All other systems reviewed and are negative.    Physical Exam Updated Vital Signs BP (!) 142/90   Pulse 78   Temp 99.7 F (37.6 C) (Oral)  Resp 16   Ht 5\' 11"  (1.803 m)   Wt (!) 161.5 kg   LMP 08/02/2017 Comment: appox 16wks  SpO2 98%   BMI 49.65 kg/m   Physical Exam  Constitutional: She is oriented to person, place, and time.  Uncomfortable, photophobic   HENT:  Head: Normocephalic.  Mouth/Throat: Oropharynx is clear and moist.  Eyes: Pupils are equal, round, and reactive to light. EOM are normal.  Neck: Normal range of motion. Neck supple.  No meningeal signs   Cardiovascular: Normal rate, regular rhythm and normal heart sounds.  Pulmonary/Chest: Effort normal and breath sounds normal.  Abdominal: Soft. Bowel sounds are normal.  Musculoskeletal: Normal range of motion.  Neurological: She is alert and oriented to person, place, and time.  CN 2- 12 intact, nl strength throughout, nl sensation throughout   Skin: Skin  is warm. Capillary refill takes less than 2 seconds.  Psychiatric: She has a normal mood and affect.  Nursing note and vitals reviewed.    ED Treatments / Results  Labs (all labs ordered are listed, but only abnormal results are displayed) Labs Reviewed  CBC WITH DIFFERENTIAL/PLATELET - Abnormal; Notable for the following components:      Result Value   RBC 3.58 (*)    Hemoglobin 9.7 (*)    HCT 30.6 (*)    All other components within normal limits  BASIC METABOLIC PANEL - Abnormal; Notable for the following components:   BUN <5 (*)    Calcium 8.5 (*)    All other components within normal limits  CK - Abnormal; Notable for the following components:   Total CK 35 (*)    All other components within normal limits  HEPATIC FUNCTION PANEL - Abnormal; Notable for the following components:   Total Protein <3.0 (*)    Albumin <1.0 (*)    AST 12 (*)    All other components within normal limits  HCG, QUANTITATIVE, PREGNANCY - Abnormal; Notable for the following components:   hCG, Beta Chain, Quant, S [id number] (*)    All other components within normal limits  I-STAT BETA HCG BLOOD, ED ([facility], [facility], [facility] ONLY) - Abnormal; Notable for the following components:   I-stat hCG, quantitative >2,000.0 (*)    All other components within normal limits  URINALYSIS, ROUTINE W REFLEX MICROSCOPIC    EKG None  Radiology US Ob Limited > 14 Wks  Result Date: 04/03/2018 CLINICAL DATA:  Lower abdominal pain. EXAM: LIMITED OBSTETRIC ULTRASOUND FINDINGS: Number of Fetuses: 1 Heart Rate:  157 bpm Movement: Present Presentation: Cephalic Placental Location: Anterior Previa: No Amniotic Fluid (Subjective):  Within normal limits. BPD: 8.2 cm 33 w  1 d MATERNAL FINDINGS: Cervix:  Appears closed. Uterus/Adnexae: Ovaries were not visualized due to maternal body habitus. IMPRESSION: 33 week 1 day viable intrauterine gestation in cephalic presentation. No previa with reference to the anterior placenta. This exam is  performed on an emergent basis and does not comprehensively evaluate fetal size, dating, or anatomy; follow-up complete OB US should be considered if further fetal assessment is warranted. Electronically Signed   By: Ashley Royalty M.D.   On: 04/03/2018 23:22    Procedures Procedures (including critical care time)  Medications Ordered in ED Medications  sodium chloride 0.9 % bolus 1,000 mL (0 mLs Intravenous Stopped 04/03/18 2258)  promethazine (PHENERGAN) injection 25 mg (25 mg Intravenous Given 04/03/18 2025)  ketorolac (TORADOL) 30 MG/ML injection 30 mg (30 mg Intravenous Given 04/03/18 2023)  dexamethasone (DECADRON) injection 4 mg (4  mg Intravenous Given 04/03/18 2052)  labetalol (NORMODYNE,TRANDATE) injection 10 mg (10 mg Intravenous Given 04/03/18 2102)     Initial Impression / Assessment and Plan / ED Course  I have reviewed the triage vital signs and the nursing notes.  Pertinent labs & imaging results that were available during my care of the patient were reviewed by me and considered in my medical decision making (see chart for details).    VIANNA VENEZIA is a 32 y.o. female  Here with headache. Hx of migraines and is photophobic but has nonfocal neuro exam. Patient hypertensive but has been hypertensive during previous ED visits. Will hold off on neuro imaging. Will get labs, give migraine cocktail.  10 pm Patient's HCG was positive.  Patient initially told me that she had a miscarriage about 3 months ago.  Patient did not get any prenatal care and did not follow-up with OB.  She is overweight but appears to have a gravid uterus but denies any vaginal bleeding.  Unfortunately since she did not mention that she was pregnant, I had ordered Toradol.  Her hCG level was 19,000.  Her ultrasound showed 33-week pregnant fetus. I talked to Dr. Roselie Awkward from [facility] with concern for possible preeclampsia vs HTN with headaches. He recommend transfer to [facility] hospital to [facility] and he will see patient  and observe overnight.   11:44 PM I discussed transfer with patient and patient is adamant that she wants to go home to take care of other kids. I explained in detail regarding pre eclampsia and how it can lead to seizures as well as side effects to the baby. She is competent and understands the risks for AMA and will sign paperwork for AMA.   Final Clinical Impressions(s) / ED Diagnoses   Final diagnoses:  Pre-eclampsia in third trimester    ED Discharge Orders    None       Drenda Freeze, MD 04/03/18 2345

## 2018-04-03 NOTE — ED Notes (Signed)
I have spoken to the ob rapid response nurse

## 2018-05-26 ENCOUNTER — Inpatient Hospital Stay (HOSPITAL_COMMUNITY): Payer: BLUE CROSS/BLUE SHIELD | Admitting: Anesthesiology

## 2018-05-26 ENCOUNTER — Inpatient Hospital Stay (HOSPITAL_COMMUNITY)
Admission: AD | Admit: 2018-05-26 | Discharge: 2018-05-29 | DRG: 787 | Disposition: A | Discharge status: Home / Self Care | Payer: BLUE CROSS/BLUE SHIELD | Source: Ambulatory Visit | Attending: Obstetrics and Gynecology | Admitting: Obstetrics and Gynecology

## 2018-05-26 ENCOUNTER — Encounter (HOSPITAL_COMMUNITY)
Admission: AD | Disposition: A | Discharge status: Home / Self Care | Payer: Self-pay | Source: Ambulatory Visit | Attending: Obstetrics and Gynecology

## 2018-05-26 ENCOUNTER — Encounter (HOSPITAL_COMMUNITY): Payer: Self-pay | Admitting: *Deleted

## 2018-05-26 DIAGNOSIS — O99210 Obesity complicating pregnancy, unspecified trimester: Secondary | ICD-10-CM | POA: Diagnosis present

## 2018-05-26 DIAGNOSIS — O99334 Smoking (tobacco) complicating childbirth: Secondary | ICD-10-CM | POA: Diagnosis present

## 2018-05-26 DIAGNOSIS — F1210 Cannabis abuse, uncomplicated: Secondary | ICD-10-CM | POA: Diagnosis present

## 2018-05-26 DIAGNOSIS — O34211 Maternal care for low transverse scar from previous cesarean delivery: Secondary | ICD-10-CM | POA: Diagnosis present

## 2018-05-26 DIAGNOSIS — F17210 Nicotine dependence, cigarettes, uncomplicated: Secondary | ICD-10-CM | POA: Diagnosis present

## 2018-05-26 DIAGNOSIS — Z6841 Body Mass Index (BMI) 40.0 and over, adult: Secondary | ICD-10-CM

## 2018-05-26 DIAGNOSIS — O480 Post-term pregnancy: Secondary | ICD-10-CM

## 2018-05-26 DIAGNOSIS — O9081 Anemia of the puerperium: Secondary | ICD-10-CM | POA: Diagnosis not present

## 2018-05-26 DIAGNOSIS — D62 Acute posthemorrhagic anemia: Secondary | ICD-10-CM | POA: Diagnosis not present

## 2018-05-26 DIAGNOSIS — O1410 Severe pre-eclampsia, unspecified trimester: Secondary | ICD-10-CM | POA: Diagnosis present

## 2018-05-26 DIAGNOSIS — O99324 Drug use complicating childbirth: Secondary | ICD-10-CM | POA: Diagnosis present

## 2018-05-26 DIAGNOSIS — Z3A40 40 weeks gestation of pregnancy: Secondary | ICD-10-CM

## 2018-05-26 DIAGNOSIS — O0930 Supervision of pregnancy with insufficient antenatal care, unspecified trimester: Secondary | ICD-10-CM

## 2018-05-26 DIAGNOSIS — O99214 Obesity complicating childbirth: Secondary | ICD-10-CM | POA: Diagnosis present

## 2018-05-26 DIAGNOSIS — O1002 Pre-existing essential hypertension complicating childbirth: Secondary | ICD-10-CM | POA: Diagnosis present

## 2018-05-26 DIAGNOSIS — O114 Pre-existing hypertension with pre-eclampsia, complicating childbirth: Secondary | ICD-10-CM | POA: Diagnosis present

## 2018-05-26 DIAGNOSIS — Z8759 Personal history of other complications of pregnancy, childbirth and the puerperium: Secondary | ICD-10-CM

## 2018-05-26 DIAGNOSIS — O10913 Unspecified pre-existing hypertension complicating pregnancy, third trimester: Secondary | ICD-10-CM | POA: Diagnosis present

## 2018-05-26 DIAGNOSIS — Z23 Encounter for immunization: Secondary | ICD-10-CM | POA: Diagnosis not present

## 2018-05-26 DIAGNOSIS — O119 Pre-existing hypertension with pre-eclampsia, unspecified trimester: Secondary | ICD-10-CM

## 2018-05-26 DIAGNOSIS — Z720 Tobacco use: Secondary | ICD-10-CM | POA: Diagnosis present

## 2018-05-26 DIAGNOSIS — O0933 Supervision of pregnancy with insufficient antenatal care, third trimester: Secondary | ICD-10-CM

## 2018-05-26 DIAGNOSIS — O99332 Smoking (tobacco) complicating pregnancy, second trimester: Secondary | ICD-10-CM | POA: Diagnosis present

## 2018-05-26 DIAGNOSIS — Z98891 History of uterine scar from previous surgery: Secondary | ICD-10-CM

## 2018-05-26 HISTORY — DX: Partial loss of teeth, unspecified cause, unspecified class: K08.409

## 2018-05-26 LAB — COMPREHENSIVE METABOLIC PANEL
ALBUMIN: 2.3 g/dL — AB (ref 3.5–5.0)
ALT: 10 U/L (ref 0–44)
ALT: 9 U/L (ref 0–44)
ALT: 10 U/L (ref 0–44)
ANION GAP: 4 — AB (ref 5–15)
AST: 14 U/L — ABNORMAL LOW (ref 15–41)
AST: 13 U/L — ABNORMAL LOW (ref 15–41)
AST: 15 U/L (ref 15–41)
Albumin: 2.7 g/dL — ABNORMAL LOW (ref 3.5–5.0)
Albumin: 2.2 g/dL — ABNORMAL LOW (ref 3.5–5.0)
Alkaline Phosphatase: 162 U/L — ABNORMAL HIGH (ref 38–126)
Alkaline Phosphatase: 107 U/L (ref 38–126)
Alkaline Phosphatase: 110 U/L (ref 38–126)
Anion gap: 6 (ref 5–15)
Anion gap: 5 (ref 5–15)
BUN: 6 mg/dL (ref 6–20)
BUN: 6 mg/dL (ref 6–20)
BUN: 6 mg/dL (ref 6–20)
CHLORIDE: 109 mmol/L (ref 98–111)
CO2: 21 mmol/L — ABNORMAL LOW (ref 22–32)
CO2: 22 mmol/L (ref 22–32)
CO2: 21 mmol/L — ABNORMAL LOW (ref 22–32)
Calcium: 8.5 mg/dL — ABNORMAL LOW (ref 8.9–10.3)
Calcium: 7.4 mg/dL — ABNORMAL LOW (ref 8.9–10.3)
Calcium: 7.6 mg/dL — ABNORMAL LOW (ref 8.9–10.3)
Chloride: 107 mmol/L (ref 98–111)
Chloride: 108 mmol/L (ref 98–111)
Creatinine, Ser: 0.62 mg/dL (ref 0.44–1.00)
Creatinine, Ser: 0.53 mg/dL (ref 0.44–1.00)
Creatinine, Ser: 0.52 mg/dL (ref 0.44–1.00)
GFR calc Af Amer: >60 mL/min (ref >60)
GFR calc Af Amer: >60 mL/min (ref >60)
GFR calc Af Amer: >60 mL/min (ref >60)
GFR calc non Af Amer: >60 mL/min (ref >60)
GFR calc non Af Amer: >60 mL/min (ref >60)
GFR calc non Af Amer: >60 mL/min (ref >60)
GLUCOSE: 98 mg/dL (ref 70–99)
Glucose, Bld: 90 mg/dL (ref 70–99)
Glucose, Bld: 101 mg/dL — ABNORMAL HIGH (ref 70–99)
Potassium: 3.5 mmol/L (ref 3.5–5.1)
Potassium: 3.7 mmol/L (ref 3.5–5.1)
Potassium: 4.2 mmol/L (ref 3.5–5.1)
Sodium: 134 mmol/L — ABNORMAL LOW (ref 135–145)
Sodium: 135 mmol/L (ref 135–145)
Sodium: 134 mmol/L — ABNORMAL LOW (ref 135–145)
Total Bilirubin: 0.7 mg/dL (ref 0.3–1.2)
Total Bilirubin: 0.2 mg/dL — ABNORMAL LOW (ref 0.3–1.2)
Total Bilirubin: 0.3 mg/dL (ref 0.3–1.2)
Total Protein: 6.7 g/dL (ref 6.5–8.1)
Total Protein: 5.1 g/dL — ABNORMAL LOW (ref 6.5–8.1)
Total Protein: 5.4 g/dL — ABNORMAL LOW (ref 6.5–8.1)

## 2018-05-26 LAB — CBC
HCT: 23 % — ABNORMAL LOW (ref 36.0–46.0)
HEMATOCRIT: 30.9 % — AB (ref 36.0–46.0)
HEMATOCRIT: 22.5 % — AB (ref 36.0–46.0)
Hemoglobin: 10.3 g/dL — ABNORMAL LOW (ref 12.0–15.0)
Hemoglobin: 7.6 g/dL — ABNORMAL LOW (ref 12.0–15.0)
Hemoglobin: 7.5 g/dL — ABNORMAL LOW (ref 12.0–15.0)
MCH: 27 pg (ref 26.0–34.0)
MCH: 27.2 pg (ref 26.0–34.0)
MCH: 27.6 pg (ref 26.0–34.0)
MCHC: 33.3 g/dL (ref 30.0–36.0)
MCHC: 33 g/dL (ref 30.0–36.0)
MCHC: 33.3 g/dL (ref 30.0–36.0)
MCV: 81.1 fL (ref 80.0–100.0)
MCV: 82.4 fL (ref 80.0–100.0)
MCV: 82.7 fL (ref 80.0–100.0)
NRBC: 0 % (ref 0.0–0.2)
Platelets: 226 10*3/uL (ref 150–400)
Platelets: 183 10*3/uL (ref 150–400)
Platelets: 199 10*3/uL (ref 150–400)
RBC: 3.81 MIL/uL — ABNORMAL LOW (ref 3.87–5.11)
RBC: 2.79 MIL/uL — ABNORMAL LOW (ref 3.87–5.11)
RBC: 2.72 MIL/uL — AB (ref 3.87–5.11)
RDW: 13.9 % (ref 11.5–15.5)
RDW: 13.8 % (ref 11.5–15.5)
RDW: 14.3 % (ref 11.5–15.5)
WBC: 9.8 10*3/uL (ref 4.0–10.5)
WBC: 17.9 10*3/uL — ABNORMAL HIGH (ref 4.0–10.5)
WBC: 13.3 10*3/uL — AB (ref 4.0–10.5)
nRBC: 0 % (ref 0.0–0.2)
nRBC: 0 % (ref 0.0–0.2)

## 2018-05-26 LAB — TYPE AND SCREEN
ABO/RH(D): O POS
Antibody Screen: NEGATIVE

## 2018-05-26 LAB — POCT I-STAT EG7
Acid-base deficit: 4 mmol/L — ABNORMAL HIGH (ref 0.0–2.0)
Bicarbonate: 21.1 mmol/L (ref 20.0–28.0)
Calcium, Ion: 1.17 mmol/L (ref 1.15–1.40)
HCT: 21 % — ABNORMAL LOW (ref 36.0–46.0)
Hemoglobin: 7.1 g/dL — ABNORMAL LOW (ref 12.0–15.0)
O2 SAT: 93 %
Patient temperature: 98
Potassium: 3.5 mmol/L (ref 3.5–5.1)
SODIUM: 138 mmol/L (ref 135–145)
TCO2: 22 mmol/L (ref 22–32)
pCO2, Ven: 36.6 mmHg — ABNORMAL LOW (ref 44.0–60.0)
pH, Ven: 7.367 (ref 7.250–7.430)
pO2, Ven: 69 mmHg — ABNORMAL HIGH (ref 32.0–45.0)

## 2018-05-26 LAB — URINALYSIS, ROUTINE W REFLEX MICROSCOPIC
Bilirubin Urine: NEGATIVE
Glucose, UA: NEGATIVE mg/dL
Ketones, ur: NEGATIVE mg/dL
Nitrite: NEGATIVE
Protein, ur: 100 mg/dL — AB
Specific Gravity, Urine: 1.03 (ref 1.005–1.030)
WBC, UA: >50 WBC/hpf — ABNORMAL HIGH (ref 0–5)
pH: 5 (ref 5.0–8.0)

## 2018-05-26 LAB — HEPATITIS B SURFACE ANTIGEN: Hepatitis B Surface Ag: NEGATIVE

## 2018-05-26 LAB — PROTEIN / CREATININE RATIO, URINE
Creatinine, Urine: 560 mg/dL
Protein Creatinine Ratio: 0.06 mg/mg{Cre} (ref 0.00–0.15)
Total Protein, Urine: 35 mg/dL

## 2018-05-26 LAB — RPR: RPR: NONREACTIVE

## 2018-05-26 LAB — RAPID URINE DRUG SCREEN, HOSP PERFORMED
Amphetamines: NOT DETECTED
Barbiturates: NOT DETECTED
Benzodiazepines: NOT DETECTED
Cocaine: NOT DETECTED
Opiates: NOT DETECTED
Tetrahydrocannabinol: POSITIVE — AB

## 2018-05-26 LAB — RAPID HIV SCREEN (HIV 1/2 AB+AG)
HIV 1/2 Antibodies: NONREACTIVE
HIV-1 P24 Antigen - HIV24: NONREACTIVE

## 2018-05-26 SURGERY — Surgical Case
Anesthesia: Spinal | Site: Abdomen | Wound class: Clean Contaminated

## 2018-05-26 MED ORDER — ACETAMINOPHEN 10 MG/ML IV SOLN
1000.0000 mg | Freq: Once | INTRAVENOUS | Status: AC
  Administered 2018-05-26: 1000 mg via INTRAVENOUS

## 2018-05-26 MED ORDER — OXYTOCIN 10 UNIT/ML IJ SOLN
INTRAVENOUS | Status: DC | PRN
  Administered 2018-05-26: 40 [IU] via INTRAVENOUS

## 2018-05-26 MED ORDER — MAGNESIUM SULFATE 40 G IN LACTATED RINGERS - SIMPLE
2.0000 g/h | INTRAVENOUS | Status: DC
  Administered 2018-05-26 – 2018-05-27 (×2): 2 g/h via INTRAVENOUS
  Filled 2018-05-26 (×2): qty 500

## 2018-05-26 MED ORDER — LABETALOL HCL 5 MG/ML IV SOLN
40.0000 mg | INTRAVENOUS | Status: DC | PRN
  Filled 2018-05-26: qty 8

## 2018-05-26 MED ORDER — SENNOSIDES-DOCUSATE SODIUM 8.6-50 MG PO TABS
2.0000 | ORAL_TABLET | ORAL | Status: DC
  Administered 2018-05-26: 2 via ORAL
  Filled 2018-05-26 (×3): qty 2

## 2018-05-26 MED ORDER — FENTANYL CITRATE (PF) 100 MCG/2ML IJ SOLN
INTRAMUSCULAR | Status: AC
  Filled 2018-05-26: qty 2

## 2018-05-26 MED ORDER — LACTATED RINGERS IV BOLUS
500.0000 mL | Freq: Once | INTRAVENOUS | Status: AC
  Administered 2018-05-26: 500 mL via INTRAVENOUS

## 2018-05-26 MED ORDER — SIMETHICONE 80 MG PO CHEW
80.0000 mg | CHEWABLE_TABLET | ORAL | Status: DC
  Administered 2018-05-26: 80 mg via ORAL
  Filled 2018-05-26: qty 1

## 2018-05-26 MED ORDER — HYDROMORPHONE HCL 1 MG/ML IJ SOLN
INTRAMUSCULAR | Status: AC
  Filled 2018-05-26: qty 0.5

## 2018-05-26 MED ORDER — WITCH HAZEL-GLYCERIN EX PADS: 1.0000 "application " | MEDICATED_PAD | CUTANEOUS | Status: DC | PRN

## 2018-05-26 MED ORDER — SIMETHICONE 80 MG PO CHEW
80.0000 mg | CHEWABLE_TABLET | Freq: Three times a day (TID) | ORAL | Status: DC
  Administered 2018-05-26 – 2018-05-29 (×8): 80 mg via ORAL
  Filled 2018-05-26 (×8): qty 1

## 2018-05-26 MED ORDER — DEXAMETHASONE SODIUM PHOSPHATE 4 MG/ML IJ SOLN
INTRAMUSCULAR | Status: AC
  Filled 2018-05-26: qty 1

## 2018-05-26 MED ORDER — DEXAMETHASONE SODIUM PHOSPHATE 4 MG/ML IJ SOLN
INTRAMUSCULAR | Status: DC | PRN
  Administered 2018-05-26: 4 mg via INTRAVENOUS

## 2018-05-26 MED ORDER — SIMETHICONE 80 MG PO CHEW: 80.0000 mg | CHEWABLE_TABLET | ORAL | Status: DC | PRN

## 2018-05-26 MED ORDER — ALBUMIN HUMAN 5 % IV SOLN
INTRAVENOUS | Status: DC | PRN
  Administered 2018-05-26: 07:00:00 via INTRAVENOUS

## 2018-05-26 MED ORDER — MENTHOL 3 MG MT LOZG: 1.0000 | LOZENGE | OROMUCOSAL | Status: DC | PRN

## 2018-05-26 MED ORDER — ACETAMINOPHEN 500 MG PO TABS
1000.0000 mg | ORAL_TABLET | Freq: Four times a day (QID) | ORAL | Status: AC
  Administered 2018-05-26 – 2018-05-27 (×4): 1000 mg via ORAL
  Filled 2018-05-26 (×4): qty 2

## 2018-05-26 MED ORDER — ZOLPIDEM TARTRATE 5 MG PO TABS
5.0000 mg | ORAL_TABLET | Freq: Every evening | ORAL | Status: DC | PRN
  Administered 2018-05-26: 5 mg via ORAL
  Filled 2018-05-26: qty 1

## 2018-05-26 MED ORDER — LABETALOL HCL 5 MG/ML IV SOLN
INTRAVENOUS | Status: AC
  Filled 2018-05-26: qty 4

## 2018-05-26 MED ORDER — PRENATAL MULTIVITAMIN CH
1.0000 | ORAL_TABLET | Freq: Every day | ORAL | Status: DC
  Administered 2018-05-27 – 2018-05-29 (×3): 1 via ORAL
  Filled 2018-05-26 (×3): qty 1

## 2018-05-26 MED ORDER — SUGAMMADEX SODIUM 500 MG/5ML IV SOLN
INTRAVENOUS | Status: AC
  Filled 2018-05-26: qty 5

## 2018-05-26 MED ORDER — ENOXAPARIN SODIUM 80 MG/0.8ML ~~LOC~~ SOLN: 80.0000 mg | SUBCUTANEOUS | Status: DC

## 2018-05-26 MED ORDER — PROMETHAZINE HCL 25 MG/ML IJ SOLN: 6.2500 mg | INTRAMUSCULAR | Status: DC | PRN

## 2018-05-26 MED ORDER — HYDRALAZINE HCL 20 MG/ML IJ SOLN
10.0000 mg | INTRAMUSCULAR | Status: DC | PRN
  Administered 2018-05-26: 10 mg via INTRAVENOUS
  Filled 2018-05-26 (×2): qty 1

## 2018-05-26 MED ORDER — NALBUPHINE HCL 10 MG/ML IJ SOLN: 5.0000 mg | Freq: Once | INTRAMUSCULAR | Status: DC | PRN

## 2018-05-26 MED ORDER — COCONUT OIL OIL: 1.0000 "application " | TOPICAL_OIL | Status: DC | PRN

## 2018-05-26 MED ORDER — FENTANYL CITRATE (PF) 100 MCG/2ML IJ SOLN
INTRAMUSCULAR | Status: AC
  Administered 2018-05-26: 50 ug
  Filled 2018-05-26: qty 2

## 2018-05-26 MED ORDER — PHENYLEPHRINE 8 MG IN D5W 100 ML (0.08MG/ML) PREMIX OPTIME
INJECTION | INTRAVENOUS | Status: AC
  Filled 2018-05-26: qty 100

## 2018-05-26 MED ORDER — MAGNESIUM SULFATE 40 G IN LACTATED RINGERS - SIMPLE
2.0000 g/h | INTRAVENOUS | Status: DC
  Filled 2018-05-26: qty 500

## 2018-05-26 MED ORDER — LABETALOL HCL 5 MG/ML IV SOLN
20.0000 mg | INTRAVENOUS | Status: DC | PRN
  Administered 2018-05-26: 20 mg via INTRAVENOUS
  Filled 2018-05-26 (×2): qty 4

## 2018-05-26 MED ORDER — LABETALOL HCL 5 MG/ML IV SOLN: 80.0000 mg | INTRAVENOUS | Status: DC | PRN

## 2018-05-26 MED ORDER — MAGNESIUM SULFATE 50 % IJ SOLN
INTRAMUSCULAR | Status: DC | PRN
  Administered 2018-05-26: 2 g/h via INTRAVENOUS

## 2018-05-26 MED ORDER — ACETAMINOPHEN 10 MG/ML IV SOLN
INTRAVENOUS | Status: AC
  Filled 2018-05-26: qty 100

## 2018-05-26 MED ORDER — KETOROLAC TROMETHAMINE 30 MG/ML IJ SOLN
INTRAMUSCULAR | Status: AC
  Administered 2018-05-26: 30 mg
  Filled 2018-05-26: qty 1

## 2018-05-26 MED ORDER — DEXTROSE 5 % IV SOLN
3.0000 g | INTRAVENOUS | Status: AC
  Administered 2018-05-26: 3 g via INTRAVENOUS
  Filled 2018-05-26: qty 3

## 2018-05-26 MED ORDER — FENTANYL CITRATE (PF) 250 MCG/5ML IJ SOLN
INTRAMUSCULAR | Status: AC
  Filled 2018-05-26: qty 5

## 2018-05-26 MED ORDER — SCOPOLAMINE 1 MG/3DAYS TD PT72
MEDICATED_PATCH | TRANSDERMAL | Status: DC | PRN
  Administered 2018-05-26: 1 via TRANSDERMAL

## 2018-05-26 MED ORDER — LABETALOL HCL 5 MG/ML IV SOLN
40.0000 mg | INTRAVENOUS | Status: DC | PRN
  Administered 2018-05-26: 40 mg via INTRAVENOUS
  Filled 2018-05-26: qty 8

## 2018-05-26 MED ORDER — LABETALOL HCL 5 MG/ML IV SOLN
INTRAVENOUS | Status: DC | PRN
  Administered 2018-05-26: 10 mg via INTRAVENOUS

## 2018-05-26 MED ORDER — PHENYLEPHRINE HCL 10 MG/ML IJ SOLN
INTRAMUSCULAR | Status: DC | PRN
  Administered 2018-05-26 (×2): 120 ug via INTRAVENOUS

## 2018-05-26 MED ORDER — ONDANSETRON HCL 4 MG/2ML IJ SOLN: 4.0000 mg | Freq: Three times a day (TID) | INTRAMUSCULAR | Status: DC | PRN

## 2018-05-26 MED ORDER — HYDRALAZINE HCL 20 MG/ML IJ SOLN
10.0000 mg | INTRAMUSCULAR | Status: DC | PRN
  Administered 2018-05-27: 10 mg via INTRAVENOUS

## 2018-05-26 MED ORDER — FENTANYL CITRATE (PF) 100 MCG/2ML IJ SOLN
INTRAMUSCULAR | Status: DC | PRN
  Administered 2018-05-26: 250 ug via INTRAVENOUS
  Administered 2018-05-26: 15 ug via INTRATHECAL
  Administered 2018-05-26: 35 ug via INTRAVENOUS

## 2018-05-26 MED ORDER — ONDANSETRON HCL 4 MG/2ML IJ SOLN
INTRAMUSCULAR | Status: DC | PRN
  Administered 2018-05-26: 4 mg via INTRAVENOUS

## 2018-05-26 MED ORDER — MORPHINE SULFATE (PF) 0.5 MG/ML IJ SOLN
INTRAMUSCULAR | Status: AC
  Filled 2018-05-26: qty 10

## 2018-05-26 MED ORDER — MAGNESIUM SULFATE BOLUS VIA INFUSION
4.0000 g | Freq: Once | INTRAVENOUS | Status: DC
  Filled 2018-05-26: qty 500

## 2018-05-26 MED ORDER — BUPIVACAINE IN DEXTROSE 0.75-8.25 % IT SOLN
INTRATHECAL | Status: DC | PRN
  Administered 2018-05-26: 1.8 mL via INTRATHECAL

## 2018-05-26 MED ORDER — DIPHENHYDRAMINE HCL 25 MG PO CAPS
25.0000 mg | ORAL_CAPSULE | ORAL | Status: DC | PRN
  Filled 2018-05-26: qty 1

## 2018-05-26 MED ORDER — ALBUMIN HUMAN 5 % IV SOLN
INTRAVENOUS | Status: AC
  Filled 2018-05-26: qty 250

## 2018-05-26 MED ORDER — NALOXONE HCL 4 MG/10ML IJ SOLN
1.0000 ug/kg/h | INTRAVENOUS | Status: DC | PRN
  Filled 2018-05-26: qty 5

## 2018-05-26 MED ORDER — DIPHENHYDRAMINE HCL 25 MG PO CAPS
25.0000 mg | ORAL_CAPSULE | Freq: Four times a day (QID) | ORAL | Status: DC | PRN
  Filled 2018-05-26: qty 1

## 2018-05-26 MED ORDER — KETOROLAC TROMETHAMINE 30 MG/ML IJ SOLN: 30.0000 mg | Freq: Four times a day (QID) | INTRAMUSCULAR | Status: AC | PRN

## 2018-05-26 MED ORDER — NALBUPHINE HCL 10 MG/ML IJ SOLN: 5.0000 mg | INTRAMUSCULAR | Status: DC | PRN

## 2018-05-26 MED ORDER — MORPHINE SULFATE (PF) 0.5 MG/ML IJ SOLN
INTRAMUSCULAR | Status: DC | PRN
  Administered 2018-05-26: .15 mg via INTRATHECAL

## 2018-05-26 MED ORDER — LACTATED RINGERS IV SOLN
INTRAVENOUS | Status: DC | PRN
  Administered 2018-05-26: 04:00:00 via INTRAVENOUS

## 2018-05-26 MED ORDER — PHENYLEPHRINE 40 MCG/ML (10ML) SYRINGE FOR IV PUSH (FOR BLOOD PRESSURE SUPPORT)
PREFILLED_SYRINGE | INTRAVENOUS | Status: AC
  Filled 2018-05-26: qty 20

## 2018-05-26 MED ORDER — SUCCINYLCHOLINE CHLORIDE 20 MG/ML IJ SOLN
INTRAMUSCULAR | Status: DC | PRN
  Administered 2018-05-26: 160 mg via INTRAVENOUS

## 2018-05-26 MED ORDER — GLYCOPYRROLATE 0.2 MG/ML IJ SOLN
INTRAMUSCULAR | Status: AC
  Filled 2018-05-26: qty 1

## 2018-05-26 MED ORDER — MIDAZOLAM HCL 2 MG/2ML IJ SOLN
INTRAMUSCULAR | Status: AC
  Filled 2018-05-26: qty 2

## 2018-05-26 MED ORDER — PROPOFOL 10 MG/ML IV BOLUS
INTRAVENOUS | Status: DC | PRN
  Administered 2018-05-26: 200 mg via INTRAVENOUS

## 2018-05-26 MED ORDER — ENOXAPARIN SODIUM 40 MG/0.4ML ~~LOC~~ SOLN
40.0000 mg | SUBCUTANEOUS | Status: DC
  Filled 2018-05-26: qty 0.4

## 2018-05-26 MED ORDER — OXYCODONE HCL 5 MG PO TABS
5.0000 mg | ORAL_TABLET | ORAL | Status: DC | PRN
  Administered 2018-05-26 – 2018-05-27 (×4): 5 mg via ORAL
  Administered 2018-05-27 (×2): 10 mg via ORAL
  Administered 2018-05-27 – 2018-05-29 (×5): 5 mg via ORAL
  Filled 2018-05-26: qty 2
  Filled 2018-05-26 (×6): qty 1
  Filled 2018-05-26: qty 2
  Filled 2018-05-26 (×3): qty 1

## 2018-05-26 MED ORDER — KETOROLAC TROMETHAMINE 30 MG/ML IJ SOLN: 30.0000 mg | Freq: Once | INTRAMUSCULAR | Status: DC | PRN

## 2018-05-26 MED ORDER — LIDOCAINE 5 % EX PTCH
1.0000 | MEDICATED_PATCH | CUTANEOUS | Status: AC
  Administered 2018-05-26 – 2018-05-27 (×2): 1 via TRANSDERMAL
  Filled 2018-05-26 (×3): qty 1

## 2018-05-26 MED ORDER — SODIUM CHLORIDE 0.9% FLUSH
INTRAVENOUS | Status: AC
  Filled 2018-05-26: qty 3

## 2018-05-26 MED ORDER — LACTATED RINGERS IV SOLN
INTRAVENOUS | Status: DC
  Administered 2018-05-26: 500 mL via INTRAVENOUS

## 2018-05-26 MED ORDER — TETANUS-DIPHTH-ACELL PERTUSSIS 5-2.5-18.5 LF-MCG/0.5 IM SUSP
0.5000 mL | Freq: Once | INTRAMUSCULAR | Status: AC
  Administered 2018-05-29: 0.5 mL via INTRAMUSCULAR
  Filled 2018-05-26: qty 0.5

## 2018-05-26 MED ORDER — DIPHENHYDRAMINE HCL 50 MG/ML IJ SOLN
12.5000 mg | INTRAMUSCULAR | Status: DC | PRN
  Administered 2018-05-26 (×2): 12.5 mg via INTRAVENOUS
  Filled 2018-05-26 (×2): qty 1

## 2018-05-26 MED ORDER — SUGAMMADEX SODIUM 200 MG/2ML IV SOLN
INTRAVENOUS | Status: DC | PRN
  Administered 2018-05-26: 319.4 mg via INTRAVENOUS

## 2018-05-26 MED ORDER — MAGNESIUM SULFATE 40 G IN LACTATED RINGERS - SIMPLE
2.0000 g/h | INTRAVENOUS | Status: DC
  Administered 2018-05-26 (×2): 2 g/h via INTRAVENOUS

## 2018-05-26 MED ORDER — LACTATED RINGERS IV SOLN
INTRAVENOUS | Status: DC | PRN
  Administered 2018-05-26: 03:00:00 via INTRAVENOUS

## 2018-05-26 MED ORDER — LABETALOL HCL 5 MG/ML IV SOLN
20.0000 mg | INTRAVENOUS | Status: DC | PRN
  Administered 2018-05-27: 20 mg via INTRAVENOUS

## 2018-05-26 MED ORDER — ROCURONIUM BROMIDE 100 MG/10ML IV SOLN
INTRAVENOUS | Status: DC | PRN
  Administered 2018-05-26: 50 mg via INTRAVENOUS

## 2018-05-26 MED ORDER — GLYCOPYRROLATE 0.2 MG/ML IJ SOLN
INTRAMUSCULAR | Status: DC | PRN
  Administered 2018-05-26: 0.1 mg via INTRAVENOUS

## 2018-05-26 MED ORDER — LABETALOL HCL 5 MG/ML IV SOLN
10.0000 mg | Freq: Once | INTRAVENOUS | Status: AC
  Administered 2018-05-26: 10 mg via INTRAVENOUS

## 2018-05-26 MED ORDER — LABETALOL HCL 5 MG/ML IV SOLN
80.0000 mg | INTRAVENOUS | Status: DC | PRN
  Administered 2018-05-26: 80 mg via INTRAVENOUS
  Filled 2018-05-26: qty 16

## 2018-05-26 MED ORDER — KETOROLAC TROMETHAMINE 30 MG/ML IJ SOLN
30.0000 mg | Freq: Four times a day (QID) | INTRAMUSCULAR | Status: AC | PRN
  Administered 2018-05-26 – 2018-05-27 (×3): 30 mg via INTRAVENOUS
  Filled 2018-05-26 (×3): qty 1

## 2018-05-26 MED ORDER — MAGNESIUM SULFATE 40 G IN LACTATED RINGERS - SIMPLE: 2.0000 g/h | INTRAVENOUS | Status: DC

## 2018-05-26 MED ORDER — OXYTOCIN 40 UNITS IN NORMAL SALINE INFUSION - SIMPLE MED: 2.5000 [IU]/h | INTRAVENOUS | Status: AC

## 2018-05-26 MED ORDER — SCOPOLAMINE 1 MG/3DAYS TD PT72
MEDICATED_PATCH | TRANSDERMAL | Status: AC
  Filled 2018-05-26: qty 1

## 2018-05-26 MED ORDER — BUPIVACAINE HCL (PF) 0.5 % IJ SOLN
INTRAMUSCULAR | Status: AC
  Filled 2018-05-26: qty 30

## 2018-05-26 MED ORDER — ONDANSETRON HCL 4 MG/2ML IJ SOLN
INTRAMUSCULAR | Status: AC
  Filled 2018-05-26: qty 2

## 2018-05-26 MED ORDER — NALOXONE HCL 0.4 MG/ML IJ SOLN: 0.4000 mg | INTRAMUSCULAR | Status: DC | PRN

## 2018-05-26 MED ORDER — DIBUCAINE 1 % RE OINT: 1.0000 "application " | TOPICAL_OINTMENT | RECTAL | Status: DC | PRN

## 2018-05-26 MED ORDER — FENTANYL CITRATE (PF) 100 MCG/2ML IJ SOLN
25.0000 ug | INTRAMUSCULAR | Status: DC | PRN
  Administered 2018-05-26: 50 ug via INTRAVENOUS

## 2018-05-26 MED ORDER — SODIUM CHLORIDE 0.9% FLUSH: 3.0000 mL | INTRAVENOUS | Status: DC | PRN

## 2018-05-26 MED ORDER — MIDAZOLAM HCL 2 MG/2ML IJ SOLN
INTRAMUSCULAR | Status: DC | PRN
  Administered 2018-05-26: 2 mg via INTRAVENOUS

## 2018-05-26 MED ORDER — LACTATED RINGERS IV BOLUS: 1000.0000 mL | Freq: Once | INTRAVENOUS | Status: DC

## 2018-05-26 MED ORDER — ENOXAPARIN SODIUM 80 MG/0.8ML ~~LOC~~ SOLN
80.0000 mg | SUBCUTANEOUS | Status: DC
  Administered 2018-05-26 – 2018-05-28 (×3): 80 mg via SUBCUTANEOUS
  Filled 2018-05-26 (×6): qty 0.8

## 2018-05-26 MED ORDER — BUPIVACAINE HCL (PF) 0.5 % IJ SOLN
INTRAMUSCULAR | Status: DC | PRN
  Administered 2018-05-26: 10 mL

## 2018-05-26 SURGICAL SUPPLY — 36 items
BARRIER ADHS 3X4 INTERCEED (GAUZE/BANDAGES/DRESSINGS) IMPLANT
CHLORAPREP W/TINT 26ML (MISCELLANEOUS) ×3 IMPLANT
CLAMP CORD UMBIL (MISCELLANEOUS) IMPLANT
CLOSURE WOUND 1/2 X4 (GAUZE/BANDAGES/DRESSINGS) ×1
CLOTH BEACON ORANGE TIMEOUT ST (SAFETY) ×3 IMPLANT
DRSG OPSITE POSTOP 4X10 (GAUZE/BANDAGES/DRESSINGS) ×3 IMPLANT
ELECT REM PT RETURN 9FT ADLT (ELECTROSURGICAL) ×3
ELECTRODE REM PT RTRN 9FT ADLT (ELECTROSURGICAL) ×1 IMPLANT
EXTENDER TRAXI PANNICULUS (MISCELLANEOUS) ×1 IMPLANT
EXTRACTOR VACUUM KIWI (MISCELLANEOUS) IMPLANT
GLOVE BIO SURGEON STRL SZ 6.5 (GLOVE) ×8 IMPLANT
GLOVE BIO SURGEONS STRL SZ 6.5 (GLOVE) ×4
GLOVE BIOGEL PI IND STRL 7.0 (GLOVE) ×5 IMPLANT
GLOVE BIOGEL PI INDICATOR 7.0 (GLOVE) ×10
GOWN STRL REUS W/TWL LRG LVL3 (GOWN DISPOSABLE) ×12 IMPLANT
KIT ABG SYR 3ML LUER SLIP (SYRINGE) IMPLANT
NEEDLE HYPO 22GX1.5 SAFETY (NEEDLE) IMPLANT
NEEDLE HYPO 25X5/8 SAFETYGLIDE (NEEDLE) IMPLANT
NS IRRIG 1000ML POUR BTL (IV SOLUTION) ×3 IMPLANT
PACK C SECTION WH (CUSTOM PROCEDURE TRAY) ×3 IMPLANT
PAD ABD 8X10 STRL (GAUZE/BANDAGES/DRESSINGS) ×3 IMPLANT
PAD OB MATERNITY 4.3X12.25 (PERSONAL CARE ITEMS) ×3 IMPLANT
PENCIL SMOKE EVAC W/HOLSTER (ELECTROSURGICAL) ×3 IMPLANT
RETRACTOR WND ALEXIS 25 LRG (MISCELLANEOUS) IMPLANT
RTRCTR WOUND ALEXIS 25CM LRG (MISCELLANEOUS)
SPONGE GAUZE 4X4 12PLY (GAUZE/BANDAGES/DRESSINGS) ×3 IMPLANT
STRIP CLOSURE SKIN 1/2X4 (GAUZE/BANDAGES/DRESSINGS) ×2 IMPLANT
SUT PLAIN 2 0 XLH (SUTURE) ×3 IMPLANT
SUT VIC AB 0 CT1 36 (SUTURE) ×21 IMPLANT
SUT VIC AB 2-0 CT1 27 (SUTURE) ×2
SUT VIC AB 2-0 CT1 TAPERPNT 27 (SUTURE) ×1 IMPLANT
SUT VIC AB 4-0 PS2 27 (SUTURE) ×3 IMPLANT
SYR CONTROL 10ML LL (SYRINGE) IMPLANT
TOWEL OR 17X24 6PK STRL BLUE (TOWEL DISPOSABLE) ×3 IMPLANT
TRAXI PANNICULUS EXTENDER (MISCELLANEOUS) ×2
TRAY FOLEY W/BAG SLVR 14FR LF (SET/KITS/TRAYS/PACK) IMPLANT

## 2018-05-26 NOTE — Progress Notes (Signed)
Pt complained of not feeling well and hot. BP taken and found to be hypotensive. MD called and to come bedside.

## 2018-05-26 NOTE — Anesthesia Procedure Notes (Signed)
Procedure Name: Intubation Date/Time: 05/26/2018 3:47 AM Performed by: Genevie Ann, CRNA Pre-anesthesia Checklist: Patient identified, Emergency Drugs available, Suction available, Patient being monitored and Timeout performed Patient Re-evaluated:Patient Re-evaluated prior to induction Oxygen Delivery Method: Circle system utilized Preoxygenation: Pre-oxygenation with 100% oxygen Induction Type: IV induction Ventilation: Mask ventilation without difficulty Laryngoscope Size: Mac and 3 Grade View: Grade I Tube type: Oral Number of attempts: 1 Placement Confirmation: ETT inserted through vocal cords under direct vision Secured at: 22 cm

## 2018-05-26 NOTE — Progress Notes (Signed)
Call from Macario Golds (anesthesiologist) to check on type/screen status. Informed caller that the type/screen was collected; however an order needed to be placed. Order placed. Type/screen results pending.  Informed caller the patient is on her way to OR. Caller agrees.   Gilmer Mor RN

## 2018-05-26 NOTE — Transfer of Care (Signed)
Immediate Anesthesia Transfer of Care Note  Patient: [facility]  Procedure(s) Performed: CESAREAN SECTION (N/A Abdomen)  Patient Location: PACU  Anesthesia Type:General  Level of Consciousness: awake, alert  and oriented  Airway & Oxygen Therapy: Patient Spontanous Breathing  Post-op Assessment: Report given to RN and Post -op Vital signs reviewed and stable  Post vital signs: Reviewed and stable  Last Vitals:  Vitals Value Taken Time  BP 144/92 05/26/2018  4:56 AM  Temp    Pulse 85 05/26/2018  5:00 AM  Resp 19 05/26/2018  5:00 AM  SpO2 98 % 05/26/2018  5:00 AM  Vitals shown include unvalidated device data.  Last Pain:  Vitals:   05/26/18 0125  PainSc: 6       Patients Stated Pain Goal: 0 (27/61/47 0929)  Complications: No apparent anesthesia complications

## 2018-05-26 NOTE — Op Note (Signed)
[name] PROCEDURE DATE: 05/26/2018  PREOPERATIVE DIAGNOSES: Intrauterine pregnancy at [redacted]w[redacted]d weeks gestation; previous uterine incision kerr x2  POSTOPERATIVE DIAGNOSES: The same  PROCEDURE: Primary Low Transverse Cesarean Section  SURGEON:  Woodroe Mode, MD  ASSISTANT:  Gerri Lins DO  ANESTHESIOLOGY TEAM: Anesthesiologist: Brennan Bailey, MD CRNA: Genevie Ann, CRNA  INDICATIONS: Tabitha Holland is a 33 y.o. [phone] at [redacted]w[redacted]d here for cesarean section secondary to the indications listed under preoperative diagnoses; please see preoperative note for further details.  The risks of cesarean section were discussed with the patient including but were not limited to: bleeding which may require transfusion or reoperation; infection which may require antibiotics; injury to bowel, bladder, ureters or other surrounding organs; injury to the fetus; need for additional procedures including hysterectomy in the event of a life-threatening hemorrhage; placental abnormalities wth subsequent pregnancies, incisional problems, thromboembolic phenomenon and other postoperative/anesthesia complications.   The patient concurred with the proposed plan, giving informed written consent for the procedure.    FINDINGS:  Viable female infant in cephalic presentation.  Apgars 9 and 9.  Clear amniotic fluid.  Intact placenta, three vessel cord.  Normal uterus, fallopian tubes and ovaries bilaterally.  ANESTHESIA: Spinal and GET  INTRAVENOUS FLUIDS: 1500 ml   ESTIMATED BLOOD LOSS: 200 ml URINE OUTPUT:  100 ml SPECIMENS: Placenta sent to L&D COMPLICATIONS: None immediate  PROCEDURE IN DETAIL:  The patient preoperatively received intravenous antibiotics and had sequential compression devices applied to her lower extremities.  She was then taken to the operating room where spinal anesthesia was administered and was found to be inadequate. She was then placed in a dorsal supine position with  a leftward tilt, and prepped and draped in a sterile manner.  A foley catheter was placed into her bladder and attached to constant gravity.  After an adequate timeout was performed, general anesthesia was induced and a Pfannenstiel skin incision was made with scalpel on her preexisting scar and carried through to the underlying layer of fascia. The fascia was incised in the midline, and this incision was extended bilaterally using the Mayo scissors.  Kocher clamps were applied to the superior aspect of the fascial incision and the underlying rectus muscles were dissected off bluntly and sharply.  A similar process was carried out on the inferior aspect of the fascial incision. The rectus muscles were separated in the midline and the peritoneum was entered bluntly. The Alexis self-retaining retractor was introduced into the abdominal cavity.  Attention was turned to the lower uterine segment where a low transverse hysterotomy was made with a scalpel and extended bilaterally bluntly.  The infant was successfully delivered, the cord was clamped and cut and the infant was handed over to the awaiting neonatology team. Uterine massage was then administered, and the placenta delivered intact with a three-vessel cord. The uterus was then cleared of clots and debris.  The hysterotomy was closed with 0 Vicryl in a running locked fashion, and an imbricating layer was also placed with 0 Vicryl. The pelvis was cleared of all clot and debris. Hemostasis was confirmed on all surfaces.  The retractor was removed.  The peritoneum was closed with a 2-0 Vicryl running stitch. The fascia was then closed using 0 Vicryl.  The subcutaneous layer was irrigated, and the skin was closed with a 4-0 Vicryl subcuticular stitch. The patient tolerated the procedure well. Sponge, instrument and needle counts were correct x 3.  She was taken to the recovery room in stable  condition.    Woodroe Mode, MD [place], [facility] for [facility], [facility] 05/26/2018 5:04 AM

## 2018-05-26 NOTE — Progress Notes (Signed)
Dr. Roselie Awkward notified of patient status in PACU to include a drop in blood pressure and possible vagal response. Pt evaluated by Anesthesia MD and iStat was performed. Hgb result from iStat is 7.1 from 10.3 pre-operatively. Total blood loss from OR & PACU 132ml. Dr. Roselie Awkward aware and new orders for CBC and CMP. Christella Hartigan, MD agrees and would like those labs run stat. RN at bedside and will continue to update.

## 2018-05-26 NOTE — Anesthesia Postprocedure Evaluation (Signed)
Anesthesia Post Note  Patient: Tabitha Holland  Procedure(s) Performed: CESAREAN SECTION (N/A Abdomen)     Patient location during evaluation: PACU Anesthesia Type: Combined General/Spinal Level of consciousness: awake and alert Pain management: pain level controlled Vital Signs Assessment: post-procedure vital signs reviewed and stable Respiratory status: spontaneous breathing, nonlabored ventilation and respiratory function stable Cardiovascular status: blood pressure returned to baseline and stable Postop Assessment: no apparent nausea or vomiting Anesthetic complications: no Comments: Patient had a brief episode of symptomatic hypotension which resolved quickly. Labs results were notable for a Hgb of 7.6 and mild base deficit. She received additional 1.5 L fluid. She was watched for an additional 3 hours in PACU with no additional episodes of hypotension.     Last Vitals:  Vitals:   05/26/18 1020 05/26/18 1029  BP: 119/78 130/76  Pulse: 61 (!) 52  Resp: 18 16  Temp:  36.9 C  SpO2: 99% 100%    Last Pain:  Vitals:   05/26/18 1029  TempSrc: Oral  PainSc:    Pain Goal: Patients Stated Pain Goal: 4 (05/26/18 0820)                 Lidia Collum

## 2018-05-26 NOTE — H&P (Signed)
Attestation of Attending Supervision of Advanced Practitioner (CNM/NP/PA): Evaluation and management procedures were performed by the Advanced Practitioner under my supervision and collaboration. I have reviewed the Advanced Practitioner's note and chart, and I agree with the management and plan The risks of cesarean section discussed with the patient included but were not limited to: bleeding which may require transfusion or reoperation; infection which may require antibiotics; injury to bowel, bladder, ureters or other surrounding organs; injury to the fetus; need for additional procedures including hysterectomy in the event of a life-threatening hemorrhage; placental abnormalities wth subsequent pregnancies, incisional problems, thromboembolic phenomenon and other postoperative/anesthesia complications. The patient concurred with the proposed plan, giving informed written consent for the procedure.   Patient has been NPO since 1700 she will remain NPO for procedure. Anesthesia and OR aware. Preoperative prophylactic antibiotics and SCDs ordered on call to the OR.  To OR when ready.    Emeterio Reeve MD     Expand All Collapse All    Show:Clear all [x] Manual[x] Template[] Copied  Added by: [x] Leftwich-Kirby, Kathie Dike, CNM  [] Hover for details Chief Complaint:  Contractions; Abdominal Pain; and Dizziness   First Provider Initiated Contact with Patient 05/26/18 0213      HPI: Tabitha Holland is a 33 y.o. [id number] at [redacted]w[redacted]d by LMP, c/w 33 week Limited OB US with CHTN, no prenatal care, and hx C/S x 2 who presents to maternity admissions reporting contractions, dizziness, constant lower pelvic pain. The contractions are intermittent, low in her abdomen, radiate to her low back, and are strong at times, weaker at times.  They started 2 days ago but worsened tonight.  She has constant lower abdomen/pelvic pain that worsens with walking, changing positions.  She has not had prenatal care in this  pregnancy. She was unsure she wanted to keep the pregnancy initially, then has been unsure if she will keep the baby throughout.  She has not told anyone except for one friend that she is pregnant.  She denies h/a, epigastric pain, or visual disturbances.  She does report dizziness with onset today.  She reports good fetal movement.  HPI  Past Medical History:     Past Medical History:  Diagnosis Date  . Hypertension   . Morbid obesity (HCC)     Past obstetric history:                 OB History  Gravida Para Term Preterm AB Living  5 4 4  0 0 4  SAB TAB Ectopic Multiple Live Births     0 0 0 0 4       # Outcome Date GA Lbr Len/2nd Weight Sex Delivery Anes PTL Lv  5 Current           4 Term 09/15/15 [redacted]w[redacted]d  3650 g M CS-Vac Spinal  LIV  3 Term 10/28/13 [redacted]w[redacted]d  3165 g M CS-LTranv Spinal  LIV  2 Term 03/13/09 [redacted]w[redacted]d   F Vag-Spont EPI  LIV     Birth Comments: mom unsure of weight; child adopted; [facility]   1 Term 05/10/07 [redacted]w[redacted]d  3147 g M Vag-Spont EPI  LIV    Past Surgical History:      Past Surgical History:  Procedure Laterality Date  . CESAREAN SECTION N/A 10/28/2013   Procedure: CESAREAN SECTION;  Surgeon: Emily Filbert, MD;  Location: [facility];  Service: Obstetrics;  Laterality: N/A;  . CESAREAN SECTION N/A 09/15/2015   Procedure: CESAREAN SECTION;  Surgeon: Mora Bellman, MD;  Location: [facility]  BIRTHING SUITES;  Service: Obstetrics;  Laterality: N/A;  . CHOLECYSTECTOMY    . WISDOM TOOTH EXTRACTION      Family History:      Family History  Problem Relation Age of Onset  . Hypertension Sister     Social History: Social History        Tobacco Use  . Smoking status: Current Every Day Smoker    Packs/day: 0.25    Types: Cigarettes  . Smokeless tobacco: Never Used  Substance Use Topics  . Alcohol use: Not Currently  . Drug use: Yes    Types: Marijuana    Allergies:       Allergies  Allergen Reactions  . Codeine Other (See  Comments)    migraines  . Hydrocodone Other (See Comments)    migraines  . Percocet [Oxycodone-Acetaminophen] Other (See Comments)    migraines    Meds:         Medications Prior to Admission  Medication Sig Dispense Refill Last Dose  . prenatal vitamin w/FE, FA (PRENATAL 1 + 1) 27-1 MG TABS tablet Take 1 tablet by mouth daily at 12 noon.   05/25/2018 at Unknown time  . acetaminophen (TYLENOL) 325 MG tablet Take 325 mg by mouth every 6 (six) hours as needed for moderate pain.   11/29/2017 at Unknown time  . metoCLOPramide (REGLAN) 10 MG tablet Take 1 tablet (10 mg total) by mouth every 6 (six) hours. 30 tablet 0     ROS:  Review of Systems  Constitutional: Negative for chills, fatigue and fever.  Eyes: Negative for visual disturbance.  Respiratory: Negative for shortness of breath.   Cardiovascular: Negative for chest pain.  Gastrointestinal: Positive for abdominal pain. Negative for nausea and vomiting.  Genitourinary: Positive for pelvic pain. Negative for difficulty urinating, dysuria, flank pain, vaginal bleeding, vaginal discharge and vaginal pain.  Neurological: Positive for dizziness. Negative for headaches.  Psychiatric/Behavioral: Negative.      I have reviewed patient's Past Medical Hx, Surgical Hx, Family Hx, Social Hx, medications and allergies.   Physical Exam   Patient Vitals for the past 24 hrs:  BP Temp Pulse Resp Height Weight  05/26/18 0218 (!) 174/107 - (!) 57 - - -  05/26/18 0208 (!) 196/113 - 63 - - -  05/26/18 0202 (!) 216/117 - 64 - - -  05/26/18 0124 (!) 181/105 - (!) 56 - - -  05/26/18 0122 - 97.7 F (36.5 C) - (!) 22 5\' 11"  (1.803 m) (!) 159.7 kg   Constitutional: Well-developed, well-nourished female in no acute distress.  Cardiovascular: normal rate Respiratory: normal effort GI: Abd soft, non-tender, gravid appropriate for gestational age.  MS: Extremities nontender, no edema, normal ROM Neurologic: Alert and oriented x  4.  GU: Neg CVAT.   Dilation: 1 Effacement (%): 50 Presentation: Vertex Exam by:: lisa leftwhich kirby cnm  FHR: Initially, Baseline 120 ,minimal with periods of moderate variability, no accels, no decelerations. After IV fluids, accels present.   Contractions: None on toco or to palpation   Labs: LabResultsLast24Hours  Results for orders placed or performed during the hospital encounter of 05/26/18 (from the past 24 hour(s))  Urinalysis, Routine w reflex microscopic     Status: Abnormal   Collection Time: 05/26/18  1:28 AM  Result Value Ref Range   Color, Urine AMBER (A) YELLOW   APPearance CLOUDY (A) CLEAR   Specific Gravity, Urine 1.030 1.005 - 1.030   pH 5.0 5.0 - 8.0   Glucose, UA NEGATIVE  NEGATIVE mg/dL   Hgb urine dipstick SMALL (A) NEGATIVE   Bilirubin Urine NEGATIVE NEGATIVE   Ketones, ur NEGATIVE NEGATIVE mg/dL   Protein, ur 100 (A) NEGATIVE mg/dL   Nitrite NEGATIVE NEGATIVE   Leukocytes, UA LARGE (A) NEGATIVE   RBC / HPF 21-50 0 - 5 RBC/hpf   WBC, UA >50 (H) 0 - 5 WBC/hpf   Bacteria, UA FEW (A) NONE SEEN   Squamous Epithelial / LPF 21-50 0 - 5   Mucus PRESENT    Non Squamous Epithelial 0-5 (A) NONE SEEN  Protein / creatinine ratio, urine     Status: None   Collection Time: 05/26/18  1:38 AM  Result Value Ref Range   Creatinine, Urine 560.00 mg/dL   Total Protein, Urine 35 mg/dL   Protein Creatinine Ratio 0.06 0.00 - 0.15 mg/mg[Cre]  Urine rapid drug screen (hosp performed)not at [facility]     Status: Abnormal   Collection Time: 05/26/18  1:38 AM  Result Value Ref Range   Opiates NONE DETECTED NONE DETECTED   Cocaine NONE DETECTED NONE DETECTED   Benzodiazepines NONE DETECTED NONE DETECTED   Amphetamines NONE DETECTED NONE DETECTED   Tetrahydrocannabinol POSITIVE (A) NONE DETECTED   Barbiturates NONE DETECTED NONE DETECTED  CBC     Status: Abnormal   Collection Time: 05/26/18  2:07 AM  Result Value Ref Range   WBC 9.8  4.0 - 10.5 K/uL   RBC 3.81 (L) 3.87 - 5.11 MIL/uL   Hemoglobin 10.3 (L) 12.0 - 15.0 g/dL   HCT 30.9 (L) 36.0 - 46.0 %   MCV 81.1 80.0 - 100.0 fL   MCH 27.0 26.0 - 34.0 pg   MCHC 33.3 30.0 - 36.0 g/dL   RDW 13.9 11.5 - 15.5 %   Platelets 226 150 - 400 K/uL   nRBC 0.0 0.0 - 0.2 %     --/--/O POS (07/29 0830)  Imaging:  ImagingResults  No results found.    MAU Course/MDM:    Orders Placed This Encounter  Procedures  . Urinalysis, Routine w reflex microscopic  . Protein / creatinine ratio, urine  . Urine rapid drug screen (hosp performed)not at [facility]  . Comprehensive metabolic panel  . CBC  . Measure blood pressure        Meds ordered this encounter  Medications  . AND Linked Order Group   . labetalol (NORMODYNE,TRANDATE) injection 20 mg   . labetalol (NORMODYNE,TRANDATE) injection 40 mg   . labetalol (NORMODYNE,TRANDATE) injection 80 mg   . hydrALAZINE (APRESOLINE) injection 10 mg  . lactated ringers bolus 1,000 mL    NST reviewed, reactive after IV fluids.  Preeclampsia focused order set with labetalol protocol and preeclampsia labs ordered. Consult Dr Roselie Awkward with presentation, exam findings and test results.  Admit for repeat C/S   Assessment: 1. Chronic hypertension with superimposed preeclampsia   2. Post term pregnancy over 40 weeks   3. No prenatal care in current pregnancy in third trimester   4. History of 2 cesarean sections     Plan: Treat BP with labetalol protocol IV fluids Deliver tonight by repeat C/S Close f/u postpartum for BP  Fatima Blank Certified Nurse-Midwife 05/26/2018 2:39 AM    Cosigned by: Woodroe Mode, MD at 05/26/2018 2:42 AM

## 2018-05-26 NOTE — Progress Notes (Signed)
OB Note  Came to see patient re: pain issues, ?high FH.  Patient got spinal but didn't work so underwent general. Pt states that PO narcotics give her a HA. In 2017 she got 1-2 days worth of oxycodone that were given, as documented in the [facility], but patient states she didn't get any meds during that and doesn't remember any issues. D/w anesthesia and will avoid pca given recent regional. Patient making good UOP and CBC below.  FH feels like at the umbilicus but difficult due to body habitus. Pressure dressing and dressing removed and u/s done and uterus appears to be at the umbilicus. Dressing with some blood on the corners.  Will replace dressing, do PO oxycodone on pain scale and lidoderm patches. Patient not restarted on Mg so will restart this and rpt labs later today.   CBC Latest Ref Rng & Units 05/26/2018 05/26/2018 05/26/2018  WBC 4.0 - 10.5 K/uL 17.9(H) - 9.8  Hemoglobin 12.0 - 15.0 g/dL 7.6(L) 7.1(L) 10.3(L)  Hematocrit 36.0 - 46.0 % 23.0(L) 21.0(L) 30.9(L)  Platelets 150 - 400 K/uL 183 - 226   Pt with no PNC and cHTN with superimposed severe pre-eclampsia.   Durene Romans MD Attending Center for [employer] (Faculty Practice) 05/26/2018 Time: [phone]

## 2018-05-26 NOTE — Progress Notes (Signed)
Pt is refusing fundal rub due to pain. RN has given Toradol and  Fentanyl. RN called provider to notify him.

## 2018-05-26 NOTE — Anesthesia Preprocedure Evaluation (Signed)
Anesthesia Evaluation  Patient identified by MRN, date of birth, ID band Patient awake    Reviewed: Allergy & Precautions, NPO status , Patient's Chart, lab work & pertinent test results  History of Anesthesia Complications Negative for: history of anesthetic complications  Airway Mallampati: II  TM Distance: >3 FB Neck ROM: Full    Dental  (+) Chipped,    Pulmonary Current Smoker,    Pulmonary exam normal breath sounds clear to auscultation       Cardiovascular hypertension, Normal cardiovascular exam Rhythm:Regular Rate:Normal     Neuro/Psych negative neurological ROS     GI/Hepatic negative GI ROS, Neg liver ROS,   Endo/Other  Morbid obesity  Renal/GU negative Renal ROS     Musculoskeletal negative musculoskeletal ROS (+)   Abdominal   Peds  Hematology  (+) anemia , Hgb 10.3   Anesthesia Other Findings Day of surgery medications reviewed with the patient.  Reproductive/Obstetrics (+) Pregnancy Hx of C/S x2, currently with pre-eclampsia superimposed on cHTN                             Anesthesia Physical Anesthesia Plan  ASA: III and emergent  Anesthesia Plan: Spinal   Post-op Pain Management:    Induction:   PONV Risk Score and Plan: Treatment may vary due to age or medical condition, Ondansetron and Dexamethasone  Airway Management Planned: Natural Airway  Additional Equipment:   Intra-op Plan:   Post-operative Plan:   Informed Consent: I have reviewed the patients History and Physical, chart, labs and discussed the procedure including the risks, benefits and alternatives for the proposed anesthesia with the patient or authorized representative who has indicated his/her understanding and acceptance.     Dental advisory given  Plan Discussed with: CRNA  Anesthesia Plan Comments:         Anesthesia Quick Evaluation

## 2018-05-26 NOTE — Anesthesia Procedure Notes (Signed)
Spinal  Patient location during procedure: OR Start time: 05/26/2018 3:07 AM End time: 05/26/2018 3:10 AM Staffing Anesthesiologist: Brennan Bailey, MD Performed: anesthesiologist  Preanesthetic Checklist Completed: patient identified, site marked, pre-op evaluation, timeout performed, IV checked, risks and benefits discussed and monitors and equipment checked Spinal Block Patient position: sitting Prep: ChloraPrep Patient monitoring: heart rate, cardiac monitor and continuous pulse ox Approach: midline Location: L3-4 Injection technique: single-shot Needle Needle type: Pencan  Needle gauge: 24 G Needle length: 10 cm Additional Notes Risks, benefits, and alternative discussed. Patient gave consent to procedure. Prepped and draped in sitting position. Clear CSF obtained after one needle redirection. Positive terminal aspiration. No pain or paraesthesias with injection. Patient tolerated procedure well. Vital signs stable. Tawny Asal, MD

## 2018-05-26 NOTE — MAU Provider Note (Addendum)
Chief Complaint:  Contractions; Abdominal Pain; and Dizziness   First Provider Initiated Contact with Patient 05/26/18 0213      HPI: Tabitha Holland is a 33 y.o. G5P4004 at [redacted]w[redacted]d by LMP, c/w 33 week Limited OB US with CHTN, no prenatal care, and hx C/S x 2 who presents to maternity admissions reporting contractions, dizziness, constant lower pelvic pain. The contractions are intermittent, low in her abdomen, radiate to her low back, and are strong at times, weaker at times.  They started 2 days ago but worsened tonight.  She has constant lower abdomen/pelvic pain that worsens with walking, changing positions.  She has not had prenatal care in this pregnancy. She was unsure she wanted to keep the pregnancy initially, then has been unsure if she will keep the baby throughout.  She has not told anyone except for one friend that she is pregnant.  She denies h/a, epigastric pain, or visual disturbances.  She does report dizziness with onset today.  She reports good fetal movement.  HPI  Past Medical History: Past Medical History:  Diagnosis Date  . Hypertension   . Morbid obesity (HCC)     Past obstetric history: OB History  Gravida Para Term Preterm AB Living  5 4 4  0 0 4  SAB TAB Ectopic Multiple Live Births  0 0 0 0 4    # Outcome Date GA Lbr Len/2nd Weight Sex Delivery Anes PTL Lv  5 Current           4 Term 09/15/15 [redacted]w[redacted]d  3650 g M CS-Vac Spinal  LIV  3 Term 10/28/13 [redacted]w[redacted]d  3165 g M CS-LTranv Spinal  LIV  2 Term 03/13/09 [redacted]w[redacted]d   F Vag-Spont EPI  LIV     Birth Comments: mom unsure of weight; child adopted; [facility]   1 Term 05/10/07 [redacted]w[redacted]d  3147 g M Vag-Spont EPI  LIV    Past Surgical History: Past Surgical History:  Procedure Laterality Date  . CESAREAN SECTION N/A 10/28/2013   Procedure: CESAREAN SECTION;  Surgeon: Emily Filbert, MD;  Location: [facility];  Service: Obstetrics;  Laterality: N/A;  . CESAREAN SECTION N/A 09/15/2015   Procedure: CESAREAN SECTION;  Surgeon: Mora Bellman,  MD;  Location: [facility];  Service: Obstetrics;  Laterality: N/A;  . CHOLECYSTECTOMY    . WISDOM TOOTH EXTRACTION      Family History: Family History  Problem Relation Age of Onset  . Hypertension Sister     Social History: Social History   Tobacco Use  . Smoking status: Current Every Day Smoker    Packs/day: 0.25    Types: Cigarettes  . Smokeless tobacco: Never Used  Substance Use Topics  . Alcohol use: Not Currently  . Drug use: Yes    Types: Marijuana    Allergies:  Allergies  Allergen Reactions  . Codeine Other (See Comments)    migraines  . Hydrocodone Other (See Comments)    migraines  . Percocet [Oxycodone-Acetaminophen] Other (See Comments)    migraines    Meds:  Medications Prior to Admission  Medication Sig Dispense Refill Last Dose  . prenatal vitamin w/FE, FA (PRENATAL 1 + 1) 27-1 MG TABS tablet Take 1 tablet by mouth daily at 12 noon.   05/25/2018 at Unknown time  . acetaminophen (TYLENOL) 325 MG tablet Take 325 mg by mouth every 6 (six) hours as needed for moderate pain.   11/29/2017 at Unknown time  . metoCLOPramide (REGLAN) 10 MG tablet Take 1 tablet (10  mg total) by mouth every 6 (six) hours. 30 tablet 0     ROS:  Review of Systems  Constitutional: Negative for chills, fatigue and fever.  Eyes: Negative for visual disturbance.  Respiratory: Negative for shortness of breath.   Cardiovascular: Negative for chest pain.  Gastrointestinal: Positive for abdominal pain. Negative for nausea and vomiting.  Genitourinary: Positive for pelvic pain. Negative for difficulty urinating, dysuria, flank pain, vaginal bleeding, vaginal discharge and vaginal pain.  Neurological: Positive for dizziness. Negative for headaches.  Psychiatric/Behavioral: Negative.      I have reviewed patient's Past Medical Hx, Surgical Hx, Family Hx, Social Hx, medications and allergies.   Physical Exam   Patient Vitals for the past 24 hrs:  BP Temp Pulse Resp Height  Weight  05/26/18 0218 (!) 174/107 - (!) 57 - - -  05/26/18 0208 (!) 196/113 - 63 - - -  05/26/18 0202 (!) 216/117 - 64 - - -  05/26/18 0124 (!) 181/105 - (!) 56 - - -  05/26/18 0122 - 97.7 F (36.5 C) - (!) 22 5\' 11"  (1.803 m) (!) 159.7 kg   Constitutional: Well-developed, well-nourished female in no acute distress.  Cardiovascular: normal rate Respiratory: normal effort GI: Abd soft, non-tender, gravid appropriate for gestational age.  MS: Extremities nontender, no edema, normal ROM Neurologic: Alert and oriented x 4.  GU: Neg CVAT.   Dilation: 1 Effacement (%): 50 Presentation: Vertex Exam by:: Xayla Puzio leftwhich kirby cnm  FHR: Initially, Baseline 120 ,minimal with periods of moderate variability, no accels, no decelerations. After IV fluids, accels present.   Contractions: None on toco or to palpation   Labs: Results for orders placed or performed during the hospital encounter of 05/26/18 (from the past 24 hour(s))  Urinalysis, Routine w reflex microscopic     Status: Abnormal   Collection Time: 05/26/18  1:28 AM  Result Value Ref Range   Color, Urine AMBER (A) YELLOW   APPearance CLOUDY (A) CLEAR   Specific Gravity, Urine 1.030 1.005 - 1.030   pH 5.0 5.0 - 8.0   Glucose, UA NEGATIVE NEGATIVE mg/dL   Hgb urine dipstick SMALL (A) NEGATIVE   Bilirubin Urine NEGATIVE NEGATIVE   Ketones, ur NEGATIVE NEGATIVE mg/dL   Protein, ur 100 (A) NEGATIVE mg/dL   Nitrite NEGATIVE NEGATIVE   Leukocytes, UA LARGE (A) NEGATIVE   RBC / HPF 21-50 0 - 5 RBC/hpf   WBC, UA >50 (H) 0 - 5 WBC/hpf   Bacteria, UA FEW (A) NONE SEEN   Squamous Epithelial / LPF 21-50 0 - 5   Mucus PRESENT    Non Squamous Epithelial 0-5 (A) NONE SEEN  Protein / creatinine ratio, urine     Status: None   Collection Time: 05/26/18  1:38 AM  Result Value Ref Range   Creatinine, Urine 560.00 mg/dL   Total Protein, Urine 35 mg/dL   Protein Creatinine Ratio 0.06 0.00 - 0.15 mg/mg[Cre]  Urine rapid drug screen (hosp  performed)not at [facility]     Status: Abnormal   Collection Time: 05/26/18  1:38 AM  Result Value Ref Range   Opiates NONE DETECTED NONE DETECTED   Cocaine NONE DETECTED NONE DETECTED   Benzodiazepines NONE DETECTED NONE DETECTED   Amphetamines NONE DETECTED NONE DETECTED   Tetrahydrocannabinol POSITIVE (A) NONE DETECTED   Barbiturates NONE DETECTED NONE DETECTED  CBC     Status: Abnormal   Collection Time: 05/26/18  2:07 AM  Result Value Ref Range   WBC 9.8 4.0 -  10.5 K/uL   RBC 3.81 (L) 3.87 - 5.11 MIL/uL   Hemoglobin 10.3 (L) 12.0 - 15.0 g/dL   HCT 30.9 (L) 36.0 - 46.0 %   MCV 81.1 80.0 - 100.0 fL   MCH 27.0 26.0 - 34.0 pg   MCHC 33.3 30.0 - 36.0 g/dL   RDW 13.9 11.5 - 15.5 %   Platelets 226 150 - 400 K/uL   nRBC 0.0 0.0 - 0.2 %   --/--/O POS (07/29 0830)  Imaging:  No results found.  MAU Course/MDM: Orders Placed This Encounter  Procedures  . Urinalysis, Routine w reflex microscopic  . Protein / creatinine ratio, urine  . Urine rapid drug screen (hosp performed)not at [facility]  . Comprehensive metabolic panel  . CBC  . Measure blood pressure    Meds ordered this encounter  Medications  . AND Linked Order Group   . labetalol (NORMODYNE,TRANDATE) injection 20 mg   . labetalol (NORMODYNE,TRANDATE) injection 40 mg   . labetalol (NORMODYNE,TRANDATE) injection 80 mg   . hydrALAZINE (APRESOLINE) injection 10 mg  . lactated ringers bolus 1,000 mL    NST reviewed, reactive after IV fluids.  Preeclampsia focused order set with labetalol protocol and preeclampsia labs ordered. Consult Dr Roselie Awkward with presentation, exam findings and test results.  Admit for repeat C/S   Assessment: 1. Chronic hypertension with superimposed preeclampsia   2. Post term pregnancy over 40 weeks   3. No prenatal care in current pregnancy in third trimester   4. History of 2 cesarean sections     Plan: Treat BP with labetalol protocol IV fluids Deliver tonight by repeat C/S Close f/u  postpartum for BP   Fatima Blank Certified Nurse-Midwife 05/26/2018 2:39 AM

## 2018-05-26 NOTE — MAU Note (Addendum)
Ctxs for last wk. Stronger last few day. Having constant pain in abd that is different than ctxs I am having. Some pain going down R leg and leg is swollen. Hx htn but no meds. No pnc due to insurance issues. Hx 2 SVDs and 2 C/S. Some dizziness

## 2018-05-26 NOTE — Progress Notes (Signed)
Pt called out complaining of chest pain.  On further investigation pt reports pain when inhaling in ribs and sternal.  Pt is taking shallow fast breaths, given incentive spirometer with initial goal of 1000.  Pt reached 700.  Educated on use and frequency.    Vital signs determine that pt has increased respirations of 21, normotensive, normal O2 saturation and HR of 58, which is no change from previous baseline.    Patient is also emotionally upset and trying to cope with decision to given newborn up for adoption.    MD notified of pt status.  Order given to continue to monitor O2 saturation, no further orders at this time.

## 2018-05-27 LAB — RUBELLA SCREEN: Rubella: 1.34 index (ref >0.99)

## 2018-05-27 MED ORDER — FERROUS GLUCONATE 324 (38 FE) MG PO TABS
324.0000 mg | ORAL_TABLET | Freq: Two times a day (BID) | ORAL | Status: DC
  Administered 2018-05-27 – 2018-05-29 (×5): 324 mg via ORAL
  Filled 2018-05-27 (×7): qty 1

## 2018-05-27 MED ORDER — BISACODYL 10 MG RE SUPP
10.0000 mg | Freq: Every day | RECTAL | Status: DC | PRN
  Administered 2018-05-27: 10 mg via RECTAL
  Filled 2018-05-27: qty 1

## 2018-05-27 MED ORDER — AMLODIPINE BESYLATE 10 MG PO TABS
10.0000 mg | ORAL_TABLET | Freq: Every day | ORAL | Status: DC
  Administered 2018-05-27 – 2018-05-29 (×3): 10 mg via ORAL
  Filled 2018-05-27 (×3): qty 1

## 2018-05-27 MED ORDER — HYDROCHLOROTHIAZIDE 25 MG PO TABS
25.0000 mg | ORAL_TABLET | Freq: Every day | ORAL | Status: DC
  Administered 2018-05-27 – 2018-05-29 (×3): 25 mg via ORAL
  Filled 2018-05-27 (×3): qty 1

## 2018-05-27 MED FILL — Magnesium Sulfate Inj 50%: INTRAMUSCULAR | Qty: 40 | Status: AC

## 2018-05-27 NOTE — Progress Notes (Signed)
Supported Environmental education officer as she prepares to make a decision regarding adoption placement for her daughter, which she has named Anselm Pancoast.  She shared that she had only told one person, her best friend/roommate, about her pregnancy and she's concerned that her family will judge her.  We discussed her pros and cons for each decision and I invited her to imagine her life in either condition 6 months from now and how she envisions she'll feel.  She has an 106 year old son, [name], and a 31 year old son.  I offered emotional support and space to process her feelings.  Will continue to follow.    Please page as further needs arise.  Donald Prose. Elyn Peers, M.Div. [facility] Chaplain Pager [phone] Office [phone]

## 2018-05-27 NOTE — Addendum Note (Signed)
Addendum  created 05/27/18 0859 by Asher Muir, CRNA   Clinical Note Signed

## 2018-05-27 NOTE — Progress Notes (Signed)
Faculty Practice OB/GYN Attending Note  I was informed that patient recently received two doses of IV Labetalol as per protocol. She is s/p magnesium sulfate for eclampsia prophylaxis. Patient denies any headaches, visual symptoms or RUQ/epigastric pain.  Patient Vitals for the past 24 hrs:  BP Temp Temp src Pulse Resp SpO2  05/27/18 2033 (!) 140/97 - - 84 - -  05/27/18 2018 (!) 183/102 - - - - -  05/27/18 2003 (!) 183/98 99.3 F (37.4 C) Oral 87 (!) 22 100 %  05/27/18 1706 (!) 154/92 - - 96 - -  05/27/18 1616 (!) 179/104 - - 91 - -  05/27/18 1545 (!) 169/97 - - 97 20 99 %  05/27/18 1543 (!) 162/93 98.3 F (36.8 C) Oral 94 20 100 %  05/27/18 1237 124/64 98.5 F (36.9 C) Oral 72 20 98 %  05/27/18 0750 133/71 98.3 F (36.8 C) Oral 73 20 100 %  05/27/18 0401 137/80 98.6 F (37 C) Oral 75 (!) 26 99 %  05/27/18 0300 - - - - (!) 21 -  05/27/18 0200 - - - - (!) 23 -  05/27/18 0100 - - - - (!) 24 -  05/26/18 2344 (!) 128/96 98.5 F (36.9 C) Oral 72 (!) 25 99 %  05/26/18 2242 - - - - (!) 22 -  05/26/18 2155 - - - - (!) 26 -   Norvasc 10 mg daily added to regimen of HCTZ 25 mg already ordered. Will continue to monitor closely.    Verita Schneiders, MD, [place] for [employer], [place]

## 2018-05-27 NOTE — Progress Notes (Signed)
B/p med given iv per protocol  Due to increase in b/p   See flow sheet

## 2018-05-27 NOTE — Progress Notes (Signed)
Post Partum Day 1 Subjective: Patient reports feeling well. She denies HA, visual changes, RUQ/epigastric pain.   Objective: Blood pressure 133/71, pulse 73, temperature 98.3 F (36.8 C), temperature source Oral, resp. rate 20, height 5\' 11"  (1.803 m), weight (!) 159.7 kg, SpO2 100 %, unknown if currently breastfeeding.  Physical Exam:  General: alert, cooperative and no distress Lochia: appropriate Uterine Fundus: firm Incision: honeycomb dressing clean dry and intact DVT Evaluation: No evidence of DVT seen on physical exam.  Recent Labs    05/26/18 0808 05/26/18 1239  HGB 7.6* 7.5*  HCT 23.0* 22.5*    Assessment/Plan: Patient completed Magnesium sulfate Will start HCTZ Continue monitoring BP Discharge planning tomorrow   LOS: 1 day   Tabitha Holland 05/27/2018, 10:05 AM

## 2018-05-27 NOTE — Anesthesia Postprocedure Evaluation (Signed)
Anesthesia Post Note  Patient: Tabitha Holland  Procedure(s) Performed: CESAREAN SECTION (N/A Abdomen)     Patient location during evaluation: [facility] Anesthesia Type: Spinal Level of consciousness: awake Pain management: satisfactory to patient Vital Signs Assessment: post-procedure vital signs reviewed and stable Respiratory status: spontaneous breathing Cardiovascular status: stable Anesthetic complications: no    Last Vitals:  Vitals:   05/27/18 0401 05/27/18 0750  BP: 137/80 133/71  Pulse: 75 73  Resp: (!) 26 20  Temp: 37 C 36.8 C  SpO2: 99% 100%    Last Pain:  Vitals:   05/27/18 0850  TempSrc:   PainSc: 3    Pain Goal: Patients Stated Pain Goal: 4 (05/27/18 0850)                 Casimer Lanius

## 2018-05-27 NOTE — Clinical Social Work Maternal (Signed)
CLINICAL SOCIAL WORK MATERNAL/CHILD NOTE  Patient Details  Name: Tabitha Holland MRN: [id number] Date of Birth: 04/07/1986  Date:  2019-03-22  Clinical Social Worker Initiating Note:  Kingsley Spittle LCSW  Date/Time: Initiated:  05/27/18/1115     Child's Name:  Tabitha Holland    Biological Parents:  Mother   Need for Interpreter:  None   Reason for Referral:  Adoption   Address:  [address] [place] [address]    Phone number:  [phone] (home)     Additional phone number: N/a   Household Members/Support Persons (HM/SP):   Household Member/Support Person 1(MOB has two other children; ages 49 and 39 )   HM/SP Name Relationship DOB or Age  HM/SP -1        HM/SP -2        HM/SP -3        HM/SP -4        HM/SP -5        HM/SP -6        HM/SP -7        HM/SP -8          Natural Supports (not living in the home):  Friends   Professional Supports:     Employment: Animator   Type of Work: Oncologist    Education:      Homebound arranged:    Museum/gallery curator Resources:  Multimedia programmer   Other Resources:      Cultural/Religious Considerations Which May Impact Care:  N/a   Strengths:  Ability to meet basic needs , Compliance with medical plan    Psychotropic Medications:         Pediatrician:       Pediatrician List:   [place]      Pediatrician Fax Number:    Risk Factors/Current Problems:      Cognitive State:  Goal Oriented , Alert , Linear Thinking    Mood/Affect:  Tearful , Calm , Overwhelmed    CSW Assessment: CSW met with MOB via bedside due to MOB wanting to talk about adoption. MOB was pleasant and appropriate during conversation and appropriately tearful. MOB has two other children ages; 66[place]) and 4 [name]) both have different fathers. MOB states Tabitha Holland's father has health issues and is not involved and  Tabitha Holland's father is very involved. MOB informed CSW that infants FOB is not involved.   MOB informed CSW that she had not informed any of her family/ friends that she was pregnant due to feeling as if she would be judged. MOB states she told her one friend, Tabitha Holland, who has been a great support to her. MOB states that she was adopted as a child and has always felt judged by her family except for her mother and now that her mother is no longer living she does not feel very supported by her extended family. MOB voiced her primary reason for wanting to place baby up for adoption was feeling as if she would be judged by her family and friends. CSW acknowledged MOB's feelings however encouraged MOB to make this decision based on what she wanted to do. MOB became tearful and stated "I know I want to keep her". MOB continued to discuss her fears of being judged by her family, friends, co workers, and how it would be difficult to explain to  her two sons.   MOB then discussed her financial concerns- MOB stated she previously struggled financially however has been doing well recently and would feel "selfish" for bringing a new baby into their home when they have just started doing well in regards to money. MOB continued to say that she feels like every one at the hospital is encouraging her to keep Tabitha Holland and she has yet to make up her mind. CSW questioned if MOB would like to look over some adoption resources together- MOB stated yet. CSW provided MOB with list of adoption agencies. MOB's friend will be arriving at hospital around [place] this afternoon and CSW will return to room at that time.   CSW Plan/Description:  Psychosocial Support and Ongoing Assessment of Needs, Perinatal Mood and Anxiety Disorder (PMADs) Education, Other(Follow up to determine MOB's plans )    Weston Anna, LCSW December 09, 2018, 1:33 PM

## 2018-05-28 ENCOUNTER — Other Ambulatory Visit: Payer: Self-pay

## 2018-05-28 ENCOUNTER — Encounter (HOSPITAL_COMMUNITY): Payer: Self-pay

## 2018-05-28 MED ORDER — LISINOPRIL 20 MG PO TABS
20.0000 mg | ORAL_TABLET | Freq: Once | ORAL | Status: AC
  Administered 2018-05-28: 20 mg via ORAL
  Filled 2018-05-28: qty 1

## 2018-05-28 MED ORDER — PNEUMOCOCCAL VAC POLYVALENT 25 MCG/0.5ML IJ INJ
0.5000 mL | INJECTION | INTRAMUSCULAR | Status: AC
  Administered 2018-05-29: 0.5 mL via INTRAMUSCULAR
  Filled 2018-05-28 (×2): qty 0.5

## 2018-05-28 MED ORDER — ACETAMINOPHEN 325 MG PO TABS
650.0000 mg | ORAL_TABLET | ORAL | Status: DC | PRN
  Administered 2018-05-28 – 2018-05-29 (×4): 650 mg via ORAL
  Filled 2018-05-28 (×4): qty 2

## 2018-05-28 MED ORDER — LISINOPRIL 40 MG PO TABS
40.0000 mg | ORAL_TABLET | Freq: Every day | ORAL | Status: DC
  Administered 2018-05-29: 40 mg via ORAL
  Filled 2018-05-28 (×2): qty 1

## 2018-05-28 MED ORDER — LISINOPRIL 5 MG PO TABS
5.0000 mg | ORAL_TABLET | Freq: Every day | ORAL | Status: DC
  Administered 2018-05-28: 5 mg via ORAL
  Filled 2018-05-28: qty 1

## 2018-05-28 MED ORDER — NIFEDIPINE 10 MG PO CAPS
20.0000 mg | ORAL_CAPSULE | Freq: Once | ORAL | Status: AC
  Administered 2018-05-28: 20 mg via ORAL
  Filled 2018-05-28: qty 2

## 2018-05-28 MED ORDER — INFLUENZA VAC SPLIT QUAD 0.5 ML IM SUSY
0.5000 mL | PREFILLED_SYRINGE | INTRAMUSCULAR | Status: AC
  Administered 2018-05-29: 0.5 mL via INTRAMUSCULAR
  Filled 2018-05-28: qty 0.5

## 2018-05-28 MED ORDER — LISINOPRIL 20 MG PO TABS
20.0000 mg | ORAL_TABLET | Freq: Every day | ORAL | Status: DC
  Filled 2018-05-28: qty 1

## 2018-05-28 MED ORDER — ACETAMINOPHEN 325 MG PO TABS: 650.0000 mg | ORAL_TABLET | Freq: Once | ORAL | Status: DC

## 2018-05-28 NOTE — Progress Notes (Signed)
   05/28/18 1242  Vital Signs  BP (!) 160/90  Report given to Dr Elly Modena re: above BP. MD states she will put in orders for increased BP meds.

## 2018-05-28 NOTE — Progress Notes (Signed)
Pt stated she feels like her migraine  Headache is back but does not want anything else for pain since she took tylenol and  Oxycodone couple of hours ago. She stated she needed to rest and not worry about her family judging her about the newborn after she broke the news to her sister. Rn encouraged patient, offered to take baby so she can rest more but she wanted to keep baby with her, also offered to call MD for something else for her h/a but she does not want to take anything else, she just wants to rest. Her last V/S was 135/74, 99, 18, 98.0. Will continue to watch  pt  B/p closely.

## 2018-05-28 NOTE — Progress Notes (Signed)
   05/28/18 1231  Magnesium Sulfate  Reflexes Plus 1  Clonus Absent  PIH   PIH (WDL) X  Headache present Yes  Visual Disturbance None  Epigastric pain None  Atypical edema present? No  Report called to Dr Elly Modena re: BP 165/103, above assessment, & that pt h/a decreased (but still present).  No new orders at this time.  Will recheck BP 75mins from last.

## 2018-05-28 NOTE — Progress Notes (Signed)
Post Partum Day 2 Subjective: Patient reports feeling well. She denies HA, visual changes, RUQ/epigastric pain. Patient is hoping to be discharged today  Objective: Blood pressure (!) 157/92, pulse 86, temperature 99.5 F (37.5 C), temperature source Oral, resp. rate 20, height 5\' 11"  (1.803 m), weight (!) 154.4 kg, SpO2 100 %, unknown if currently breastfeeding.  Physical Exam:  General: alert, cooperative and no distress Lochia: appropriate Uterine Fundus: firm Incision: honeycomb dressing is intact DVT Evaluation: No evidence of DVT seen on physical exam.  Recent Labs    05/26/18 0808 05/26/18 1239  HGB 7.6* 7.5*  HCT 23.0* 22.5*    Assessment/Plan: Patient is doing well postoperatively Encourage ambulation Lisinopril added to antihypertensive regimen Continue close monitoring of BP and routine postpartum care   LOS: 2 days   Chanequa Spees 05/28/2018, 9:59 AM

## 2018-05-28 NOTE — Progress Notes (Signed)
CSW followed up with MOB at bedside to discuss infant's discharge plan. MOB reported that she has decided to take her baby home. CSW asked MOB if she had items to care for the baby. MOB reported that her friend went to purchase diapers, wipes, clothes and a bed for baby. MOB reported that she was not able to get a car seat and reported financial hardships. CSW informed MOB about car seat program, MOB reported that she has not used it before and has $30 dollars to pay for the car seat. CSW agreed to notify volunteer services. CSW asked MOB if she needed anything else, MOB reported no other needs and thanked CSW for visit.  CSW Research officer, political party that MOB needed a car seat. Volunteer services to follow up.   Abundio Miu, [place] Worker [facility] Cell#: [phone]

## 2018-05-29 MED ORDER — LISINOPRIL 40 MG PO TABS: 40.0000 mg | ORAL_TABLET | Freq: Every day | ORAL | 6 refills | Status: DC

## 2018-05-29 MED ORDER — OXYCODONE-ACETAMINOPHEN 5-325 MG PO TABS: 1.0000 | ORAL_TABLET | Freq: Four times a day (QID) | ORAL | 0 refills | Status: DC | PRN

## 2018-05-29 MED ORDER — IBUPROFEN 600 MG PO TABS: 600.0000 mg | ORAL_TABLET | Freq: Four times a day (QID) | ORAL | 3 refills | Status: DC | PRN

## 2018-05-29 MED ORDER — AMLODIPINE BESYLATE 10 MG PO TABS: 10.0000 mg | ORAL_TABLET | Freq: Every day | ORAL | 6 refills | Status: DC

## 2018-05-29 MED ORDER — HYDROCHLOROTHIAZIDE 25 MG PO TABS: 25.0000 mg | ORAL_TABLET | Freq: Every day | ORAL | 6 refills | Status: DC

## 2018-05-29 NOTE — Discharge Summary (Signed)
OB Discharge Summary     Patient Name: Tabitha Holland DOB: 23-Dec-1985 MRN: [id number]  Date of admission: 05/26/2018 Delivering MD: Woodroe Mode   Date of discharge: 05/29/2018  Admitting diagnosis: 51 WKS CTX PRESSURE SWELLING EDEMA IN ABD  Intrauterine pregnancy: [redacted]w[redacted]d     Secondary diagnosis:  Principal Problem:   Severe pre-eclampsia Active Problems:   Tobacco smoking complicating pregnancy in second trimester   History of gestational hypertension in previous pregnancy   Obesity in pregnancy, antepartum   Chronic hypertension in obstetric context in third trimester   Previous cesarean section   Status post repeat low transverse cesarean section   BMI 45.0-49.9, adult ([place])   Marijuana abuse   No prenatal care in current pregnancy  Discharge diagnosis: Term Pregnancy Delivered and CHTN with superimposed preeclampsia                                  Hospital course:  Sceduled C/S   33 y.o. yo G5P5005 at [redacted]w[redacted]d was admitted to the hospital 05/26/2018 for scheduled cesarean section with the following indication:Prior Uterine Surgery.  Membrane Rupture Time/Date: 3:57 AM ,05/26/2018   Patient delivered a Viable infant.05/26/2018  Details of operation can be found in separate operative note.  Pateint had a postpartum course complicated by severe preeclampsia. She received magnesium sulfate for 24 hours following delivery. Patient has a history of CHTN but was not taking medications during pregnancy. Patient started and discharged on HCTZ, norvasc and lisinopril.  She is ambulating, tolerating a regular diet, passing flatus, and urinating well. Patient is discharged home in stable condition on  05/29/18. Patient will follow up next week for BP check and incision check. She may consider starting depo-provera at that time. Patient is also considering BTL. Discharge instructions reviewed with the patient.         Physical exam  Vitals:   05/28/18 2203 05/29/18 0156 05/29/18 0610  05/29/18 0756  BP: 135/74 (!) 143/75 (!) 148/82 (!) 143/74  Pulse: 99 82 86 81  Resp: 18   20  Temp: 98 F (36.7 C)  98.7 F (37.1 C) 98.9 F (37.2 C)  TempSrc:   Oral Oral  SpO2: 98%  100% 100%  Weight:      Height:       General: alert, cooperative and no distress Lochia: appropriate Uterine Fundus: firm Incision: honey comb dressing clean dry and intact DVT Evaluation: No evidence of DVT seen on physical exam.  Labs: Lab Results  Component Value Date   WBC 13.3 (H) 05/26/2018   HGB 7.5 (L) 05/26/2018   HCT 22.5 (L) 05/26/2018   MCV 82.7 05/26/2018   PLT 199 05/26/2018   CMP Latest Ref Rng & Units 05/26/2018  Glucose 70 - 99 mg/dL 98  BUN 6 - 20 mg/dL 6  Creatinine 0.44 - 1.00 mg/dL 0.52  Sodium 135 - 145 mmol/L 134(L)  Potassium 3.5 - 5.1 mmol/L 4.2  Chloride 98 - 111 mmol/L 108  CO2 22 - 32 mmol/L 21(L)  Calcium 8.9 - 10.3 mg/dL 7.6(L)  Total Protein 6.5 - 8.1 g/dL 5.4(L)  Total Bilirubin 0.3 - 1.2 mg/dL 0.3  Alkaline Phos 38 - 126 U/L 110  AST 15 - 41 U/L 15  ALT 0 - 44 U/L 10    Discharge instruction: per After Visit Summary and "Baby and Me Booklet".  After visit meds:  Allergies as of 05/29/2018  Reactions   Codeine Other (See Comments)   migraines   Hydrocodone Other (See Comments)   migraines   Percocet [oxycodone-acetaminophen] Other (See Comments)   migraines      Medication List    STOP taking these medications   calcium carbonate 500 MG chewable tablet Commonly known as:  TUMS - dosed in mg elemental calcium   metoCLOPramide 10 MG tablet Commonly known as:  REGLAN     TAKE these medications   amLODipine 10 MG tablet Commonly known as:  NORVASC Take 1 tablet (10 mg total) by mouth daily.   hydrochlorothiazide 25 MG tablet Commonly known as:  HYDRODIURIL Take 1 tablet (25 mg total) by mouth daily.   lisinopril 40 MG tablet Commonly known as:  PRINIVIL,ZESTRIL Take 1 tablet (40 mg total) by mouth daily.   prenatal  multivitamin Tabs tablet Take 1 tablet by mouth daily at 12 noon.       Diet: low salt diet  Activity: Advance as tolerated. Pelvic rest for 6 weeks.   Outpatient follow up: 1 week Follow up Appt:No future appointments. Follow up Visit:No follow-ups on file.  Postpartum contraception: Depo Provera  Newborn Data: Live born female  Birth Weight: 5 lb 15.8 oz (2715 g) APGAR: 51, 9  Newborn Delivery   Birth date/time:  05/26/2018 03:58:00 Delivery type:  C-Section, Low Transverse Trial of labor:  No C-section categorization:  Repeat     Baby Feeding: Bottle Disposition:home with mother   05/29/2018 Mora Bellman, MD

## 2018-05-29 NOTE — Discharge Instructions (Signed)
Cesarean Delivery, Care After This sheet gives you information about how to care for yourself after your procedure. Your health care provider may also give you more specific instructions. If you have problems or questions, contact your health care provider. What can I expect after the procedure? After the procedure, it is common to have:  A small amount of blood or clear fluid coming from the incision.  Some redness, swelling, and pain in your incision area.  Some abdominal pain and soreness.  Vaginal bleeding (lochia). Even though you did not have a vaginal delivery, you will still have vaginal bleeding and discharge.  Pelvic cramps.  Fatigue. You may have pain, swelling, and discomfort in the tissue between your vagina and your anus (perineum) if:  Your C-section was unplanned, and you were allowed to labor and push.  An incision was made in the area (episiotomy) or the tissue tore during attempted vaginal delivery. Follow these instructions at home: Incision care   Follow instructions from your health care provider about how to take care of your incision. Make sure you: ? Wash your hands with soap and water before you change your bandage (dressing). If soap and water are not available, use hand sanitizer. ? If you have a dressing, change it or remove it as told by your health care provider. ? Leave stitches (sutures), skin staples, skin glue, or adhesive strips in place. These skin closures may need to stay in place for 2 weeks or longer. If adhesive strip edges start to loosen and curl up, you may trim the loose edges. Do not remove adhesive strips completely unless your health care provider tells you to do that.  Check your incision area every day for signs of infection. Check for: ? More redness, swelling, or pain. ? More fluid or blood. ? Warmth. ? Pus or a bad smell.  Do not take baths, swim, or use a hot tub until your health care provider says it's okay. Ask your health  care provider if you can take showers.  When you cough or sneeze, hug a pillow. This helps with pain and decreases the chance of your incision opening up (dehiscing). Do this until your incision heals. Medicines  Take over-the-counter and prescription medicines only as told by your health care provider.  If you were prescribed an antibiotic medicine, take it as told by your health care provider. Do not stop taking the antibiotic even if you start to feel better.  Do not drive or use heavy machinery while taking prescription pain medicine. Lifestyle  Do not drink alcohol. This is especially important if you are breastfeeding or taking pain medicine.  Do not use any products that contain nicotine or tobacco, such as cigarettes, e-cigarettes, and chewing tobacco. If you need help quitting, ask your health care provider. Eating and drinking  Drink at least 8 eight-ounce glasses of water every day unless told not to by your health care provider. If you breastfeed, you may need to drink even more water.  Eat high-fiber foods every day. These foods may help prevent or relieve constipation. High-fiber foods include: ? Whole grain cereals and breads. ? Brown rice. ? Beans. ? Fresh fruits and vegetables. Activity   If possible, have someone help you care for your baby and help with household activities for at least a few days after you leave the hospital.  Return to your normal activities as told by your health care provider. Ask your health care provider what activities are safe for  you.  Rest as much as possible. Try to rest or take a nap while your baby is sleeping.  Do not lift anything that is heavier than 10 lbs (4.5 kg), or the limit that you were told, until your health care provider says that it is safe.  Talk with your health care provider about when you can engage in sexual activity. This may depend on your: ? Risk of infection. ? How fast you heal. ? Comfort and desire to  engage in sexual activity. General instructions  Do not use tampons or douches until your health care provider approves.  Wear loose, comfortable clothing and a supportive and well-fitting bra.  Keep your perineum clean and dry. Wipe from front to back when you use the toilet.  If you pass a blood clot, save it and call your health care provider to discuss. Do not flush blood clots down the toilet before you get instructions from your health care provider.  Keep all follow-up visits for you and your baby as told by your health care provider. This is important. Contact a health care provider if:  You have: ? A fever. ? Bad-smelling vaginal discharge. ? Pus or a bad smell coming from your incision. ? Difficulty or pain when urinating. ? A sudden increase or decrease in the frequency of your bowel movements. ? More redness, swelling, or pain around your incision. ? More fluid or blood coming from your incision. ? A rash. ? Nausea. ? Little or no interest in activities you used to enjoy. ? Questions about caring for yourself or your baby.  Your incision feels warm to the touch.  Your breasts turn red or become painful or hard.  You feel unusually sad or worried.  You vomit.  You pass a blood clot from your vagina.  You urinate more than usual.  You are dizzy or light-headed. Get help right away if:  You have: ? Pain that does not go away or get better with medicine. ? Chest pain. ? Difficulty breathing. ? Blurred vision or spots in your vision. ? Thoughts about hurting yourself or your baby. ? New pain in your abdomen or in one of your legs. ? A severe headache.  You faint.  You bleed from your vagina so much that you fill more than one sanitary pad in one hour. Bleeding should not be heavier than your heaviest period. Summary  After the procedure, it is common to have pain at your incision site, abdominal cramping, and slight bleeding from your vagina.  Check  your incision area every day for signs of infection.  Tell your health care provider about any unusual symptoms.  Keep all follow-up visits for you and your baby as told by your health care provider. This information is not intended to replace advice given to you by your health care provider. Make sure you discuss any questions you have with your health care provider. Document Released: 01/07/2002 Document Revised: 10/24/2017 Document Reviewed: 10/24/2017 Elsevier Interactive Patient Education  2019 [employer].   Hypertension During Pregnancy  Hypertension, commonly called high blood pressure, is when the force of blood pumping through your arteries is too strong. Arteries are blood vessels that carry blood from the heart throughout the body. Hypertension during pregnancy can cause problems for you and your baby. Your baby may be born early (prematurely) or may not weigh as much as he or she should at birth. Very bad cases of hypertension during pregnancy can be life-threatening. Different types  of hypertension can occur during pregnancy. These include:  Chronic hypertension. This happens when: ? You have hypertension before pregnancy and it continues during pregnancy. ? You develop hypertension before you are [redacted] weeks pregnant, and it continues during pregnancy.  Gestational hypertension. This is hypertension that develops after the 20th week of pregnancy.  Preeclampsia, also called toxemia of pregnancy. This is a very serious type of hypertension that develops during pregnancy. It can be very dangerous for you and your baby. ? In rare cases, you may develop preeclampsia after giving birth (postpartum preeclampsia). This usually occurs within 48 hours after childbirth but may occur up to 6 weeks after giving birth. Gestational hypertension and preeclampsia usually go away within 6 weeks after your baby is born. Women who have hypertension during pregnancy have a greater chance of  developing hypertension later in life or during future pregnancies. What are the causes? The exact cause of hypertension during pregnancy is not known. What increases the risk? There are certain factors that make it more likely for you to develop hypertension during pregnancy. These include:  Having hypertension during a previous pregnancy or prior to pregnancy.  Being overweight.  Being age 83 or older.  Being pregnant for the first time.  Being pregnant with more than one baby.  Becoming pregnant using fertilization methods such as IVF (in vitro fertilization).  Having diabetes, kidney problems, or systemic lupus erythematosus.  Having a family history of hypertension. What are the signs or symptoms? Chronic hypertension and gestational hypertension rarely cause symptoms. Preeclampsia causes symptoms, which may include:  Increased protein in your urine. Your health care provider will check for this at every visit before you give birth (prenatal visit).  Severe headaches.  Sudden weight gain.  Swelling of the hands, face, legs, and feet.  Nausea and vomiting.  Vision problems, such as blurred or double vision.  Numbness in the face, arms, legs, and feet.  Dizziness.  Slurred speech.  Sensitivity to bright lights.  Abdominal pain.  Convulsions or seizures. How is this diagnosed? You may be diagnosed with hypertension during a routine prenatal exam. At each prenatal visit, you may:  Have a urine test to check for high amounts of protein in your urine.  Have your blood pressure checked. A blood pressure reading is given as two numbers, such as "120 over 80" (or 120/80). The first ("top") number is a measure of the pressure in your arteries when your heart beats (systolic pressure). The second ("bottom") number is a measure of the pressure in your arteries as your heart relaxes between beats (diastolic pressure). Blood pressure is measured in a unit called mm Hg. For  most women, a normal blood pressure reading is: ? Systolic: below 762. ? Diastolic: below 80. The type of hypertension that you are diagnosed with depends on your test results and when your symptoms developed.  Chronic hypertension is usually diagnosed before 20 weeks of pregnancy.  Gestational hypertension is usually diagnosed after 20 weeks of pregnancy.  Hypertension with high amounts of protein in the urine is diagnosed as preeclampsia.  Blood pressure measurements that stay above 263 systolic, or above 335 diastolic, are signs of severe preeclampsia. How is this treated? Treatment for hypertension during pregnancy varies depending on the type of hypertension you have and how serious it is.  If you take medicines called ACE inhibitors to treat chronic hypertension, you may need to switch medicines. ACE inhibitors should not be taken during pregnancy.  If you have gestational  hypertension, you may need to take blood pressure medicine.  If you are at risk for preeclampsia, your health care provider may recommend that you take a low-dose aspirin during your pregnancy.  If you have severe preeclampsia, you may need to be hospitalized so you and your baby can be monitored closely. You may also need to take medicine (magnesium sulfate) to prevent seizures and to lower blood pressure. This medicine may be given as an injection or through an IV.  In some cases, if your condition gets worse, you may need to deliver your baby early. Follow these instructions at home: Eating and drinking   Drink enough fluid to keep your urine pale yellow.  Avoid caffeine. Lifestyle  Do not use any products that contain nicotine or tobacco, such as cigarettes and e-cigarettes. If you need help quitting, ask your health care provider.  Do not use alcohol or drugs.  Avoid stress as much as possible. Rest and get plenty of sleep. General instructions  Take over-the-counter and prescription medicines  only as told by your health care provider.  While lying down, lie on your left side. This keeps pressure off your major blood vessels.  While sitting or lying down, raise (elevate) your feet. Try putting some pillows under your lower legs.  Exercise regularly. Ask your health care provider what kinds of exercise are best for you.  Keep all prenatal and follow-up visits as told by your health care provider. This is important. Contact a health care provider if:  You have symptoms that your health care provider told you may require more treatment or monitoring, such as: ? Nausea or vomiting. ? Headache. Get help right away if you have:  Severe abdominal pain that does not get better with treatment.  A severe headache that does not get better.  Vomiting that does not get better.  Sudden, rapid weight gain.  Sudden swelling in your hands, ankles, or face.  Vaginal bleeding.  Blood in your urine.  Fewer movements from your baby than usual.  Blurred or double vision.  Muscle twitching or sudden muscle tightening (spasms).  Shortness of breath.  Blue fingernails or lips. Summary  Hypertension, commonly called high blood pressure, is when the force of blood pumping through your arteries is too strong.  Hypertension during pregnancy can cause problems for you and your baby.  Treatment for hypertension during pregnancy varies depending on the type of hypertension you have and how serious it is.  Get help right away if you have symptoms that your health care provider told you to watch for. This information is not intended to replace advice given to you by your health care provider. Make sure you discuss any questions you have with your health care provider. Document Released: 01/03/2011 Document Revised: 04/03/2017 Document Reviewed: 10/01/2015 Elsevier Interactive Patient Education  2019 [employer].

## 2018-06-03 ENCOUNTER — Telehealth: Payer: Self-pay | Admitting: General Practice

## 2018-06-03 NOTE — Telephone Encounter (Signed)
Patient called into front office and left message requesting someone to call her back regarding her stomach pain.  Called patient stating I am returning her phone call. Patient states she is having pain throughout her whole belly starting from her back. Patient states she is passing gas and using the bathroom easily so she doesn't think it's related to that. Asked patient what she was taking for pain and she states only occasionally tylenol. Patient states she couldn't afford other prescriptions & didn't think they would help. Asked patient how much her percocet Rx was and she states $65. Told patient she can get it with good rx coupon for $11. Also recommended she pick up ibuprofen prescription and take q6 hrs and percocet on top of that as needed. Told patient those prescriptions will make a substantial difference in her pain. Patient verbalized understanding & had no other questions.

## 2018-06-05 ENCOUNTER — Ambulatory Visit (INDEPENDENT_AMBULATORY_CARE_PROVIDER_SITE_OTHER): Payer: BLUE CROSS/BLUE SHIELD | Admitting: *Deleted

## 2018-06-05 VITALS — BP 145/91 | HR 65 | Wt 319.1 lb

## 2018-06-05 DIAGNOSIS — K5909 Other constipation: Secondary | ICD-10-CM

## 2018-06-05 MED ORDER — DOCUSATE SODIUM 100 MG PO CAPS: 100.0000 mg | ORAL_CAPSULE | Freq: Two times a day (BID) | ORAL | 0 refills | Status: DC

## 2018-06-05 NOTE — Progress Notes (Addendum)
Here for Blood pressure and incision check. Incision clean , dry, and intact.  C/o pain in LLQ sometimes- no abnormal issues palpated. States she has not taken any pain med since she went home because of money issue but not having much pain now. We discussed is normal to have some pain still from her c/s but if pain severe go to [facility].We discussed may take otc motrin.  She also c/o constipation at times. Rx for colace sent to pharmacy and instructed to drink water.  BP 140/91 , discussed with Dr. Ihor Dow. Instructed patient to continue taking norvasc, hctz, and lisinopril as prescribed ; also to keep postpartum appointment. She voices understanding. Christell Faith of Attending Supervision of RN: Evaluation and management procedures were performed by the nurse under my supervision and collaboration.  I have reviewed the nursing note and chart, and I agree with the management and plan.  Carolyn L. Harraway-Smith, M.D., Cherlynn June

## 2018-06-10 ENCOUNTER — Encounter: Payer: Self-pay | Admitting: Obstetrics & Gynecology

## 2018-06-10 ENCOUNTER — Ambulatory Visit (INDEPENDENT_AMBULATORY_CARE_PROVIDER_SITE_OTHER): Payer: Self-pay | Admitting: Clinical

## 2018-06-10 ENCOUNTER — Ambulatory Visit (INDEPENDENT_AMBULATORY_CARE_PROVIDER_SITE_OTHER): Payer: BLUE CROSS/BLUE SHIELD | Admitting: Obstetrics & Gynecology

## 2018-06-10 VITALS — BP 142/87 | HR 82 | Ht 71.0 in | Wt 324.8 lb

## 2018-06-10 DIAGNOSIS — N898 Other specified noninflammatory disorders of vagina: Secondary | ICD-10-CM | POA: Diagnosis not present

## 2018-06-10 DIAGNOSIS — N760 Acute vaginitis: Secondary | ICD-10-CM | POA: Diagnosis not present

## 2018-06-10 DIAGNOSIS — B9689 Other specified bacterial agents as the cause of diseases classified elsewhere: Secondary | ICD-10-CM | POA: Diagnosis not present

## 2018-06-10 DIAGNOSIS — F4323 Adjustment disorder with mixed anxiety and depressed mood: Secondary | ICD-10-CM

## 2018-06-10 NOTE — BH Specialist Note (Signed)
Integrated Behavioral Health Initial Visit  MRN: [id number] Name: Tabitha Holland  Number of [place] Clinician visits:: 1/6 Session Start time: 9:55  Session End time: 11:06 Total time: 1 hour  Type of Service: [place] Interpretor:No. Interpretor Name and Language: n/a   Warm Hand Off Completed.       SUBJECTIVE: Tabitha Holland is a 33 y.o. female accompanied by n/a Patient was referred by Emeterio Reeve, MD for anxiety. Patient reports the following symptoms/concerns: Pt states her primary concern today is an increase in physical pain, including headache for the past 3 days; also  concerned with excessive worry postpartum and life stress (11yo son's reaction to loss of his grandmother 3 years ago,  and loss of girlfriend this year).  Duration of problem: Increase postpartum; Severity of problem: moderate  OBJECTIVE: Mood: Anxious and Depressed and Affect: Tearful Risk of harm to self or others: No plan to harm self or others  LIFE CONTEXT: Family and Social: Pt lives with 3 of her children (NB, 2yo; 45yo); also lives with her friend and friend's child School/Work: Pt on maternity leave Self-Care: - Life Changes: Recent childbirth via cesarean; loss of mother 3 years ago; change in son after loss of his grandmother, and loss of his girlfriend in January  GOALS ADDRESSED: Patient will: 1. Reduce symptoms of: anxiety, depression and stress 2. Increase knowledge and/or ability of: healthy habits and self-management skills  3. Demonstrate ability to: Increase healthy adjustment to current life circumstances and Increase adequate support systems for patient/family  INTERVENTIONS: Interventions utilized: Mindfulness or Psychologist, educational, Psychoeducation and/or Health Education and Link to [employer]  Standardized Assessments completed: GAD-7 and PHQ 9  ASSESSMENT: Patient currently experiencing  Adjustment disorder with mixed anxious and depressed mood.   Patient may benefit from psychoeducation and brief therapeutic interventions regarding coping with symptoms of anxiety and depression .  PLAN: 1. Follow up with behavioral health clinician on : three weeks, at postpartum visit 2. Behavioral recommendations:  -See medical provider at [facility] today for increased abdominal pain, headache for 3 days, and low iron -CALM relaxation breathing exercise twice daily (morning; bedtime) -Call [facility] for grief counseling for son -Consider attending either Mom Talk mom support (at [facility]) or online [facility] mom support, at least once prior to postpartum visit -Read educational materials regarding coping with symptoms of anxiety and depression  3. Referral(s): Integrated Orthoptist (In Clinic) and [employer] Resources:  Mom support 4. "From scale of 1-10, how likely are you to follow plan?": 9  Caroleen Hamman Reginal Wojcicki, LCSW  Depression screen [facility] 2/9 06/10/2018  Decreased Interest 2  Down, Depressed, Hopeless 2  PHQ - 2 Score 4  Altered sleeping 1  Tired, decreased energy 2  Change in appetite 2  Feeling bad or failure about yourself  3  Trouble concentrating 3  Moving slowly or fidgety/restless 0  Suicidal thoughts 0  PHQ-9 Score 15   GAD 7 : Generalized Anxiety Score 06/10/2018  Nervous, Anxious, on Edge 2  Control/stop worrying 3  Worry too much - different things 3  Trouble relaxing 2  Restless 2  Easily annoyed or irritable 3  Afraid - awful might happen 3  Total GAD 7 Score 18

## 2018-06-10 NOTE — Progress Notes (Deleted)
Subjective:     Tabitha Holland is a 33 y.o. female who presents for a postpartum visit. She is {1-10:13787} {time; units:18646} postpartum following a {delivery:12449}. I have fully reviewed the prenatal and intrapartum course. The delivery was at *** gestational weeks. Outcome: {delivery outcome:32078}. Anesthesia: {anesthesia types:812}. Postpartum course has been ***. Baby's course has been ***. Baby is feeding by {breast/bottle:69}. Bleeding {vag bleed:12292}. Bowel function is {normal:32111}. Bladder function is {normal:32111}. Patient {is/is not:9024} sexually active. Contraception method is {contraceptive method:5051}. Postpartum depression screening: {neg default:13464::"negative"}.  {Common ambulatory SmartLinks:19316}  Review of Systems {ros; complete:30496}   Objective:    LMP  (LMP Unknown) Comment: appox 16wks  General:  {gen appearance:16600}   Breasts:  {breast exam:1202::"inspection negative, no nipple discharge or bleeding, no masses or nodularity palpable"}  Lungs: {lung exam:16931}  Heart:  {heart exam:5510}  Abdomen: {abdomen exam:16834}   Vulva:  {labia exam:12198}  Vagina: {vagina exam:12200}  Cervix:  {cervix exam:14595}  Corpus: {uterus exam:12215}  Adnexa:  {adnexa exam:12223}  Rectal Exam: {rectal/vaginal exam:12274}        Assessment:    *** postpartum exam. Pap smear {done:10129} at today's visit.   Plan:    1. Contraception: {method:5051} 2. *** 3. Follow up in: {1-10:13787} {time; units:19136} or as needed.

## 2018-06-10 NOTE — Progress Notes (Signed)
Pt states is having severe abdomen pain feels like a sharp wrenching pain & has a know on the left side of her incision. Also has d/c with odor.

## 2018-06-10 NOTE — Progress Notes (Signed)
Patient ID: Tabitha Holland, female   DOB: 1985/07/14, 33 y.o.   MRN: [id number] Subjective:     Tabitha Holland is a 33 y.o. female who presents to the clinic 2 weeks status post repeat cesarean section for elective repeat. Eating a regular diet without difficulty. Bowel movements are normal. Pain is controlled with current analgesics. Medications being used: acetaminophen and prescription ibuprofen.  The following portions of the patient's history were reviewed and updated as appropriate: allergies, current medications, past family history, past medical history, past social history, past surgical history and problem list.  Review of Systems LLQ pain and knot near incision, vaginal discharge    Objective:    BP (!) 142/87   Pulse 82   Ht 5\' 11"  (1.803 m)   Wt (!) 324 lb 12.8 oz (147.3 kg)   LMP  (LMP Unknown) Comment: appox 16wks  Breastfeeding No   BMI 45.30 kg/m  General:  alert, cooperative and mild distress  Abdomen: soft, bowel sounds active, non-tender  Incision:   healing well, no drainage, no erythema, no hernia, no seroma, well approximated, incision well approximated     Assessment:    Postoperative course complicated by pain Operative findings again reviewed. Pathology report discussed.    Plan:    1. Continue any current medications. 2. Wound care discussed. 3. Activity restrictions: no lifting more than 15 pounds 4. Anticipated return to work: 2-3 weeks. 5. Follow up: 3 weeks Ibuprofen and Tylenol for pain  Woodroe Mode, MD 06/10/2018

## 2018-06-10 NOTE — Progress Notes (Signed)
Pt wants Nexplanon

## 2018-06-10 NOTE — Patient Instructions (Signed)

## 2018-06-11 ENCOUNTER — Telehealth: Payer: Self-pay | Admitting: General Practice

## 2018-06-11 ENCOUNTER — Other Ambulatory Visit: Payer: Self-pay | Admitting: Obstetrics & Gynecology

## 2018-06-11 DIAGNOSIS — O10913 Unspecified pre-existing hypertension complicating pregnancy, third trimester: Secondary | ICD-10-CM

## 2018-06-11 LAB — CERVICOVAGINAL ANCILLARY ONLY
Bacterial vaginitis: POSITIVE — AB
Candida vaginitis: NEGATIVE

## 2018-06-11 MED ORDER — METRONIDAZOLE 500 MG PO TABS: 500.0000 mg | ORAL_TABLET | Freq: Two times a day (BID) | ORAL | 0 refills | Status: DC

## 2018-06-11 NOTE — Telephone Encounter (Signed)
Per Dr Roselie Awkward, Rx for Flagyl sent to pharmacy for BV.  Called patient & informed her of results and medication sent to pharmacy. Patient verbalized understanding & had no questions.

## 2018-06-11 NOTE — Progress Notes (Signed)
Flagyl prescribed for BV 

## 2018-06-19 ENCOUNTER — Telehealth: Payer: Self-pay | Admitting: General Practice

## 2018-06-19 NOTE — Telephone Encounter (Signed)
Almyra Free from [facility] called and left message on nurse voicemail line stating she just went out to the patient's house and her postpartum depression score was 13. She states the patient denies SI or thoughts of harming the baby. She states she knows the patient saw [facility] on the 10th but wanted to reach back out to [place] because the patient definitely seems depressed/anxious. Will route to [facility]

## 2018-06-20 ENCOUNTER — Telehealth: Payer: Self-pay

## 2018-06-20 ENCOUNTER — Other Ambulatory Visit: Payer: Self-pay | Admitting: Obstetrics & Gynecology

## 2018-06-20 DIAGNOSIS — Z98891 History of uterine scar from previous surgery: Secondary | ICD-10-CM

## 2018-06-20 MED ORDER — FERROUS SULFATE 325 (65 FE) MG PO TABS: 325.0000 mg | ORAL_TABLET | Freq: Every day | ORAL | 3 refills | Status: DC

## 2018-06-20 NOTE — Telephone Encounter (Signed)
Call pt to make aware that Rx for Iron was sent to her pharmacy for pickup. Pt verbalized understanding.

## 2018-06-20 NOTE — Progress Notes (Signed)
FeSO4 rx for anemia

## 2018-06-20 NOTE — Telephone Encounter (Signed)
-----   Message from Woodroe Mode, MD sent at 06/20/2018  2:25 PM EST ----- Rx for iron sent to the pharmacy ----- Message ----- From: Barnie Mort Sent: 06/20/2018   8:36 AM EST To: Woodroe Mode, MD  This pt is presenting as depressed, but I suspect her symptoms are also related to low hemoglobin levels, at 7.5. Would you recommend she take iron and/or continue taking prenatal vitamins with iron, until her postpartum visit with you in two weeks?

## 2018-07-03 ENCOUNTER — Ambulatory Visit (INDEPENDENT_AMBULATORY_CARE_PROVIDER_SITE_OTHER): Payer: BLUE CROSS/BLUE SHIELD | Admitting: Clinical

## 2018-07-03 ENCOUNTER — Ambulatory Visit (INDEPENDENT_AMBULATORY_CARE_PROVIDER_SITE_OTHER): Payer: BLUE CROSS/BLUE SHIELD | Admitting: Obstetrics & Gynecology

## 2018-07-03 ENCOUNTER — Encounter: Payer: Self-pay | Admitting: Obstetrics & Gynecology

## 2018-07-03 DIAGNOSIS — F4323 Adjustment disorder with mixed anxiety and depressed mood: Secondary | ICD-10-CM

## 2018-07-03 DIAGNOSIS — Z1389 Encounter for screening for other disorder: Secondary | ICD-10-CM

## 2018-07-03 DIAGNOSIS — Z30017 Encounter for initial prescription of implantable subdermal contraceptive: Secondary | ICD-10-CM

## 2018-07-03 DIAGNOSIS — Z6841 Body Mass Index (BMI) 40.0 and over, adult: Secondary | ICD-10-CM

## 2018-07-03 LAB — POCT PREGNANCY, URINE: Preg Test, Ur: NEGATIVE

## 2018-07-03 MED ORDER — CITALOPRAM HYDROBROMIDE 20 MG PO TABS: 20.0000 mg | ORAL_TABLET | Freq: Every day | ORAL | 12 refills | Status: DC

## 2018-07-03 NOTE — Progress Notes (Deleted)
Subjective:     Tabitha Holland is a 33 y.o. female who presents for a postpartum visit. She is {1-10:13787} {time; units:18646} postpartum following a {delivery:12449}. I have fully reviewed the prenatal and intrapartum course. The delivery was at *** gestational weeks. Outcome: {delivery outcome:32078}. Anesthesia: {anesthesia types:812}. Postpartum course has been ***. Baby's course has been ***. Baby is feeding by {breast/bottle:69}. Bleeding {vag bleed:12292}. Bowel function is {normal:32111}. Bladder function is {normal:32111}. Patient {is/is not:9024} sexually active. Contraception method is {contraceptive method:5051}. Postpartum depression screening: {neg default:13464::"negative"}.  {Common ambulatory SmartLinks:19316}  Review of Systems {ros; complete:30496}   Objective:    LMP  (LMP Unknown) Comment: appox 16wks  General:  {gen appearance:16600}   Breasts:  {breast exam:1202::"inspection negative, no nipple discharge or bleeding, no masses or nodularity palpable"}  Lungs: {lung exam:16931}  Heart:  {heart exam:5510}  Abdomen: {abdomen exam:16834}   Vulva:  {labia exam:12198}  Vagina: {vagina exam:12200}  Cervix:  {cervix exam:14595}  Corpus: {uterus exam:12215}  Adnexa:  {adnexa exam:12223}  Rectal Exam: {rectal/vaginal exam:12274}        Assessment:    *** postpartum exam. Pap smear {done:10129} at today's visit.   Plan:    1. Contraception: {method:5051} 2. *** 3. Follow up in: {1-10:13787} {time; units:19136} or as needed.

## 2018-07-03 NOTE — Progress Notes (Signed)
Pt wants Nexplanon GYNECOLOGY OFFICE PROCEDURE NOTE  Tabitha Holland is a 33 y.o. [id number] here for Nexplanon insertion.  Last pap smear was on 2015 and was normal.  No other gynecologic concerns.  Nexplanon Insertion Procedure Patient identified, informed consent performed, consent signed.   Patient does understand that irregular bleeding is a very common side effect of this medication. She was advised to have backup contraception for one week after placement. Pregnancy test in clinic today was negative.  Appropriate time out taken.  Patient's left arm was prepped and draped in the usual sterile fashion. The ruler used to measure and mark insertion area.  Patient was prepped with alcohol swab and then injected with 3 ml of 1% lidocaine.  She was prepped with betadine, Nexplanon removed from packaging,  Device confirmed in needle, then inserted full length of needle and withdrawn per handbook instructions. Nexplanon was able to palpated in the patient's arm; patient palpated the insert herself. There was minimal blood loss.  Patient insertion site covered with gauze and a pressure bandage to reduce any bruising.  The patient tolerated the procedure well and was given post procedure instructions.    Tabitha Mode, MD Attending Obstetrician & Gynecologist, [place] for [place]  07/03/2018

## 2018-07-03 NOTE — Patient Instructions (Signed)
Etonogestrel implant  What is this medicine?  ETONOGESTREL (et oh noe JES trel) is a contraceptive (birth control) device. It is used to prevent pregnancy. It can be used for up to 3 years.  This medicine may be used for other purposes; ask your health care provider or pharmacist if you have questions.  COMMON BRAND NAME(S): Implanon, Nexplanon  What should I tell my health care provider before I take this medicine?  They need to know if you have any of these conditions:  -abnormal vaginal bleeding  -blood vessel disease or blood clots  -breast, cervical, endometrial, ovarian, liver, or uterine cancer  -diabetes  -gallbladder disease  -heart disease or recent heart attack  -high blood pressure  -high cholesterol or triglycerides  -kidney disease  -liver disease  -migraine headaches  -seizures  -stroke  -tobacco smoker  -an unusual or allergic reaction to etonogestrel, anesthetics or antiseptics, other medicines, foods, dyes, or preservatives  -pregnant or trying to get pregnant  -breast-feeding  How should I use this medicine?  This device is inserted just under the skin on the inner side of your upper arm by a health care professional.  Talk to your pediatrician regarding the use of this medicine in children. Special care may be needed.  Overdosage: If you think you have taken too much of this medicine contact a poison control center or emergency room at once.  NOTE: This medicine is only for you. Do not share this medicine with others.  What if I miss a dose?  This does not apply.  What may interact with this medicine?  Do not take this medicine with any of the following medications:  -amprenavir  -fosamprenavir  This medicine may also interact with the following medications:  -acitretin  -aprepitant  -armodafinil  -bexarotene  -bosentan  -carbamazepine  -certain medicines for fungal infections like fluconazole, ketoconazole, itraconazole and voriconazole  -certain medicines to treat hepatitis, HIV or  AIDS  -cyclosporine  -felbamate  -griseofulvin  -lamotrigine  -modafinil  -oxcarbazepine  -phenobarbital  -phenytoin  -primidone  -rifabutin  -rifampin  -rifapentine  -St. John's wort  -topiramate  This list may not describe all possible interactions. Give your health care provider a list of all the medicines, herbs, non-prescription drugs, or dietary supplements you use. Also tell them if you smoke, drink alcohol, or use illegal drugs. Some items may interact with your medicine.  What should I watch for while using this medicine?  This product does not protect you against HIV infection (AIDS) or other sexually transmitted diseases.  You should be able to feel the implant by pressing your fingertips over the skin where it was inserted. Contact your doctor if you cannot feel the implant, and use a non-hormonal birth control method (such as condoms) until your doctor confirms that the implant is in place. Contact your doctor if you think that the implant may have broken or become bent while in your arm.  You will receive a user card from your health care provider after the implant is inserted. The card is a record of the location of the implant in your upper arm and when it should be removed. Keep this card with your health records.  What side effects may I notice from receiving this medicine?  Side effects that you should report to your doctor or health care professional as soon as possible:  -allergic reactions like skin rash, itching or hives, swelling of the face, lips, or tongue  -breast lumps, breast tissue   changes, or discharge  -breathing problems  -changes in emotions or moods  -if you feel that the implant may have broken or bent while in your arm  -high blood pressure  -pain, irritation, swelling, or bruising at the insertion site  -scar at site of insertion  -signs of infection at the insertion site such as fever, and skin redness, pain or discharge  -signs and symptoms of a blood clot such as breathing  problems; changes in vision; chest pain; severe, sudden headache; pain, swelling, warmth in the leg; trouble speaking; sudden numbness or weakness of the face, arm or leg  -signs and symptoms of liver injury like dark yellow or brown urine; general ill feeling or flu-like symptoms; light-colored stools; loss of appetite; nausea; right upper belly pain; unusually weak or tired; yellowing of the eyes or skin  -unusual vaginal bleeding, discharge  Side effects that usually do not require medical attention (report to your doctor or health care professional if they continue or are bothersome):  -acne  -breast pain or tenderness  -headache  -irregular menstrual bleeding  -nausea  This list may not describe all possible side effects. Call your doctor for medical advice about side effects. You may report side effects to FDA at 1-800-FDA-1088.  Where should I keep my medicine?  This drug is given in a hospital or clinic and will not be stored at home.  NOTE: This sheet is a summary. It may not cover all possible information. If you have questions about this medicine, talk to your doctor, pharmacist, or health care provider.   2019 Elsevier/Gold Standard (2017-03-06 14:11:42)

## 2018-07-03 NOTE — BH Specialist Note (Signed)
Integrated Behavioral Health Follow Up Visit  MRN: [id number] Name: Tabitha Holland  Number of [facility] Clinician visits: 2/6 Session Start time: 11:03 Session End time: 11:21 Total time: 20 minutes  Type of Service: [place] Interpretor:No. Interpretor Name and Language: n/a  SUBJECTIVE: Tabitha Holland is a 33 y.o. female accompanied by n/a Patient was referred by Emeterio Reeve, MD for positive . Patient reports the following symptoms/concerns: Pt states her primary concern today is feelings of depression that is not diminishing; pt denies current SI, but says she has had "fleeting thoughts of not waking up" that have scared her. Pt is open to starting medication, and will have a week to adjust prior to returning to work.  Duration of problem: Increase postpartum; Severity of problem: severe  OBJECTIVE: Mood: Depressed and Affect: Appropriate Risk of harm to self or others: No plan to harm self or others  LIFE CONTEXT: Family and Social: Pt lives with two friends and children; friends will be moving out, and pt's sister will be moving in soon, as extra support.  School/Work: Pt returns to work in one week Self-Care: - Life Changes: Recent childbirth  GOALS ADDRESSED: Patient will: 1.  Reduce symptoms of: depression  2.  Increase knowledge and/or ability of: Safety plan  3.  Demonstrate ability to: Increase motivation to adhere to plan of care  INTERVENTIONS: Interventions utilized:  Solution-Focused Strategies and Psychoeducation and/or Health Education Standardized Assessments completed: C-SSRS Short, GAD-7 and PHQ 9  ASSESSMENT: Patient currently experiencing Adjustment disorder with mixed anxious and depressed mood  Patient may benefit from brief therapeutic intervention regarding coping with symptoms of anxiety and depression.  PLAN: 1. Follow up with behavioral health clinician on : 1-2 weeks, depending  on new work schedule 2. Behavioral recommendations:  -Begin taking BH medication today, as prescribed; continue taking iron pill daily, as prescribed -Read through educational material about how antidepressants work -Continue using self-coping strategies that have worked in the past -Insurance risk surveyor with either PCP or psychiatry of choice, for future ongoing [facility] med management 3. Referral(s): [facility] (In Clinic) and [facility] (LME/Outside Clinic) 4. "From scale of 1-10, how likely are you to follow plan?": 10  Garlan Fair, LCSW  Depression screen [facility] 2/9 06/10/2018  Decreased Interest 2  Down, Depressed, Hopeless 2  PHQ - 2 Score 4  Altered sleeping 1  Tired, decreased energy 2  Change in appetite 2  Feeling bad or failure about yourself  3  Trouble concentrating 3  Moving slowly or fidgety/restless 0  Suicidal thoughts 0  PHQ-9 Score 15   GAD 7 : Generalized Anxiety Score 06/10/2018  Nervous, Anxious, on Edge 2  Control/stop worrying 3  Worry too much - different things 3  Trouble relaxing 2  Restless 2  Easily annoyed or irritable 3  Afraid - awful might happen 3  Total GAD 7 Score 18

## 2018-07-03 NOTE — Progress Notes (Signed)
Subjective:     Tabitha Holland is a 33 y.o. female who presents for a postpartum visit. She is 5 weeks postpartum following a low cervical transverse Cesarean section. I have fully reviewed the prenatal and intrapartum course. The delivery was at 41/6 gestational weeks. Outcome: primary cesarean section, low transverse incision. Anesthesia: spinal. Postpartum course has been normal. Baby's course has been normal. Baby is feeding by bottle Dory Horn soothe pro. Bleeding no bleeding. Bowel function is normal. Bladder function is normal. Patient is not sexually active. Contraception method is none. Postpartum depression screening: positive. She requests Nexplanon. She has abstained from intercourse since delivery The following portions of the patient's history were reviewed and updated as appropriate: allergies, current medications, past family history, past medical history, past social history, past surgical history and problem list.  Review of Systems Pertinent items are noted in HPI.   Objective:    BP 119/85   Pulse 73   Ht 5\' 11"  (1.803 m)   Wt (!) 327 lb 6.4 oz (148.5 kg)   LMP  (LMP Unknown) Comment: appox 16wks  BMI 45.66 kg/m   General:  alert, cooperative and no distress     Lungs: effort normal  Heart:  RRR  Abdomen: soft, non-tender; bowel sounds normal; no masses,  no organomegaly and incision well healed. Obese, large pannus   Vulva:  not evaluated  Vagina: not evaluated                    Assessment:     normal postpartum exam. Pap smear not done at today's visit.   Plan:    1. Contraception: Nexplanon 2. Doing well  3. Follow up as needed.    Woodroe Mode, MD 07/03/2018

## 2018-07-09 ENCOUNTER — Encounter: Payer: Self-pay | Admitting: Obstetrics & Gynecology

## 2018-07-24 ENCOUNTER — Encounter: Payer: Self-pay | Admitting: *Deleted

## 2018-08-28 ENCOUNTER — Telehealth: Payer: Self-pay | Admitting: Lactation Services

## 2018-08-28 NOTE — Telephone Encounter (Signed)
Pt called and left voice mail on nurse phone line. Patient report she got "birth control inserted in her arm" in March and has been having a period since then. She would like to know if this is a problem and what does she need to do? She wants to know if she needs it removed. She is requesting a return call.   Routed to clinical pool for follow up

## 2018-09-02 NOTE — Telephone Encounter (Signed)
Called patient and advised her that one of the side effects of the Nexplanon was irregular periods for the 1st 3-6 months after getting it and since she recently delivered and got the Nexplanon at her postpartum appointment that she should give it time. Patient verbalized that the bleeding is like a few days and its black and then its stopped for a day or two and persists states that she would wait it out and call us back in a few months and verbalized understanding and ended the call.

## 2018-09-28 ENCOUNTER — Emergency Department (HOSPITAL_COMMUNITY)
Admission: EM | Admit: 2018-09-28 | Discharge: 2018-09-28 | Disposition: A | Discharge status: Home / Self Care | Payer: BLUE CROSS/BLUE SHIELD | Attending: Emergency Medicine | Admitting: Emergency Medicine

## 2018-09-28 ENCOUNTER — Other Ambulatory Visit: Payer: Self-pay

## 2018-09-28 ENCOUNTER — Encounter (HOSPITAL_COMMUNITY): Payer: Self-pay | Admitting: Emergency Medicine

## 2018-09-28 DIAGNOSIS — M79671 Pain in right foot: Secondary | ICD-10-CM | POA: Diagnosis not present

## 2018-09-28 DIAGNOSIS — M79673 Pain in unspecified foot: Secondary | ICD-10-CM | POA: Diagnosis not present

## 2018-09-28 DIAGNOSIS — L84 Corns and callosities: Secondary | ICD-10-CM | POA: Insufficient documentation

## 2018-09-28 DIAGNOSIS — F17210 Nicotine dependence, cigarettes, uncomplicated: Secondary | ICD-10-CM | POA: Insufficient documentation

## 2018-09-28 DIAGNOSIS — I10 Essential (primary) hypertension: Secondary | ICD-10-CM | POA: Insufficient documentation

## 2018-09-28 DIAGNOSIS — Z79899 Other long term (current) drug therapy: Secondary | ICD-10-CM | POA: Insufficient documentation

## 2018-09-28 DIAGNOSIS — B353 Tinea pedis: Secondary | ICD-10-CM | POA: Diagnosis not present

## 2018-09-28 MED ORDER — KETOCONAZOLE 2 % EX CREA: 1.0000 "application " | TOPICAL_CREAM | Freq: Every day | CUTANEOUS | 0 refills | Status: DC

## 2018-09-28 NOTE — Discharge Instructions (Addendum)
You were seen in the ED for foot pain, dryness, peeling and rash to your hand.   I think this is related to tinea pedis (athletes foot).  You have a painful corn on the bottom of your foot.   We will treat the tinea pedis with topical ketoconazole cream once daily for a minimum of 6 weeks.  Keep your feet dry as much as possible.  Tinea can be resistant to topical creams.  For further foot care see podiatrist  To help corns and calluses heal, and prevent new ones, you can: Wear shoes and socks that fit properly. Tight shoes or shoes with high heels can cause corns and calluses. Avoid going barefoot, or wearing shoes without socks. Use special pads inside your shoes to prevent rubbing.  Return for swelling, redness, warmth, pus, drainage, fever

## 2018-09-28 NOTE — ED Notes (Signed)
Patient verbalizes understanding of discharge instructions . Opportunity for questions and answers were provided . Armband removed by staff ,Pt discharged from ED. W/C  offered at D/C  and Declined W/C at D/C and was escorted to lobby by RN.  

## 2018-09-28 NOTE — ED Provider Notes (Signed)
[facility] EMERGENCY DEPARTMENT Provider Note   CSN: [id number] Arrival date & time: 09/28/18  0825    History   Chief Complaint Chief Complaint  Patient presents with  . Foot Pain    HPI Tabitha Holland is a 33 y.o. female with h/o HTN, pre-eclampsia, obesity presents to ED for evaluation of foot pain. Onset 2 weeks ago. Pain is described as severe, sharp, stinging on bottom of right foot.  Initially thought there was a bee stinger in it so she dug into it but didn't remove anything. Since the pain has worsened and is worse with pressure and lifting foot off the ground, palpation.  Has noticed thickened skin around this area of the foot as well.  Over the last 3-4 weeks has noticed dry, scaly, skin around her heel and outside of her feet bilaterally but worse on the right. Has also noticed similar skin dryness, peeling, bumps and itchiness in her right palm. Sometimes this dry skin burns and itches.  Has tried tylenol and percocet that she has at home without significant sustained relief.  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  Prior to [place] she used to get pedicures frequently and initially thought she needed another pedicure. No associated loss of sensation, tingling, skin redness, warmth, discharge, fevers, chills. No IVDU. She is teary eyed because of the pain in her foot, wants some quick relief.      HPI  Past Medical History:  Diagnosis Date  . Hx of wisdom tooth extraction   . Hypertension   . Morbid obesity [facility])     Patient Active Problem List   Diagnosis Date Noted  . Previous cesarean section 05/26/2018  . BMI 45.0-49.9, adult ([place]) 05/26/2018  . Marijuana abuse 05/26/2018    Past Surgical History:  Procedure Laterality Date  . CESAREAN SECTION N/A 10/28/2013   Procedure: CESAREAN SECTION;  Surgeon: Emily Filbert, MD;  Location: [facility];  Service: Obstetrics;  Laterality: N/A;  . CESAREAN SECTION N/A 09/15/2015   Procedure: CESAREAN SECTION;  Surgeon: Mora Bellman, MD;  Location: [facility];  Service: Obstetrics;  Laterality: N/A;  . CESAREAN SECTION N/A 05/26/2018   Procedure: CESAREAN SECTION;  Surgeon: Woodroe Mode, MD;  Location: [facility];  Service: Obstetrics;  Laterality: N/A;  . CHOLECYSTECTOMY    . WISDOM TOOTH EXTRACTION       OB History    Gravida  5   Para  5   Term  5   Preterm  0   AB  0   Living  5     SAB  0   TAB  0   Ectopic  0   Multiple  0   Live Births  5            Home Medications    Prior to Admission medications   Medication Sig Start Date End Date Taking? Authorizing Provider  amLODipine (NORVASC) 10 MG tablet Take 1 tablet (10 mg total) by mouth daily. 05/29/18   Constant, Peggy, MD  citalopram (CELEXA) 20 MG tablet Take 1 tablet (20 mg total) by mouth daily. 07/03/18   Woodroe Mode, MD  docusate sodium (COLACE) 100 MG capsule Take 1 capsule (100 mg total) by mouth 2 (two) times daily. Patient not taking: Reported on 06/10/2018 06/05/18   [name], MD  ferrous sulfate 325 (65 FE) MG tablet Take 1 tablet (325 mg total)  by mouth daily with breakfast. 06/20/18   Woodroe Mode, MD  hydrochlorothiazide (HYDRODIURIL) 25 MG tablet Take 1 tablet (25 mg total) by mouth daily. 05/29/18   Constant, Peggy, MD  ibuprofen (ADVIL,MOTRIN) 600 MG tablet Take 1 tablet (600 mg total) by mouth every 6 (six) hours as needed. 05/29/18   Constant, Peggy, MD  ketoconazole (NIZORAL) 2 % cream Apply 1 application topically daily. 09/28/18   Kinnie Feil, PA-C  lisinopril (PRINIVIL,ZESTRIL) 40 MG tablet Take 1 tablet (40 mg total) by mouth daily. 05/29/18   Constant, Peggy, MD  oxyCODONE-acetaminophen (PERCOCET/ROXICET) 5-325 MG tablet Take 1 tablet by mouth every 6 (six) hours as needed. Patient not taking: Reported on 06/05/2018 05/29/18   Constant, Peggy, MD  Prenatal Vit-Fe Fumarate-FA (PRENATAL MULTIVITAMIN) TABS tablet Take 1 tablet by mouth  daily at 12 noon.    [provider]    Family History Family History  Problem Relation Age of Onset  . Hypertension Sister     Social History Social History   Tobacco Use  . Smoking status: Current Every Day Smoker    Packs/day: 0.25    Types: Cigarettes  . Smokeless tobacco: Never Used  Substance Use Topics  . Alcohol use: Not Currently    Comment: occasional  . Drug use: Yes    Types: Marijuana     Allergies   Codeine; Hydrocodone; and Percocet [oxycodone-acetaminophen]   Review of Systems Review of Systems  Musculoskeletal:       Foot pain   Skin:       Skin changes   All other systems reviewed and are negative.    Physical Exam Updated Vital Signs BP (!) 174/89 (BP Location: Right Wrist)   Pulse 71   Temp 98.7 F (37.1 C) (Oral)   Resp 16   Ht 5\' 11"  (1.803 m)   Wt (!) 170.1 kg   SpO2 100%   BMI 52.30 kg/m   Physical Exam Constitutional:      Appearance: She is well-developed. She is not toxic-appearing.  HENT:     Head: Normocephalic.     Right Ear: External ear normal.     Left Ear: External ear normal.     Nose: Nose normal.  Eyes:     Conjunctiva/sclera: Conjunctivae normal.  Neck:     Musculoskeletal: Full passive range of motion without pain.  Cardiovascular:     Rate and Rhythm: Normal rate.  Pulmonary:     Effort: Pulmonary effort is normal. No tachypnea or respiratory distress.  Musculoskeletal: Normal range of motion.  Skin:    General: Skin is warm and dry.     Capillary Refill: Capillary refill takes less than 2 seconds.     Comments: Thickened, dry skin/scales in moccasin distribution of bilateral feet R worse than L.  Hyperpigmented and thickened toe RIGHT toe nails.  Tender corn on right anterior sole with surrounding thickened and hyperpigmented skin.  Small puncture noted from where patient has tried to dig stuff out, tender but no erythema, warmth, discharge, bleeding.  Non tender bullous eruption with underlying  thickened, scaly skin to right palm.   Neurological:     Mental Status: She is alert and oriented to person, place, and time.  Psychiatric:        Behavior: Behavior normal.        Thought Content: Thought content normal.      ED Treatments / Results  Labs (all labs ordered are listed, but only abnormal results are  displayed) Labs Reviewed - No data to display  EKG None  Radiology No results found.  Procedures Procedures (including critical care time)  Medications Ordered in ED Medications - No data to display   Initial Impression / Assessment and Plan / ED Course  I have reviewed the triage vital signs and the nursing notes.  Pertinent labs & imaging results that were available during my care of the patient were reviewed by me and considered in my medical decision making (see chart for details).       History, chronicity and exam most consistent with tinea pedis and painful corn of the feet. She stands on her feet for 10+ at work with closed toed shoes.  In the past she would get frequent pedicures putting her at risk of obtaining infection there.  Will treat this with symptomatic care and topical ketoconazole.  The lesions on her hand may be bullous tinea pedis vs other dermatitis or eczema vs other infectious process since she is a Camera operator, other possibilities also plausible. No signs of abscess, cellulitis, or life threatening dermatological process today. She was very frustrated, teary eyed because of the constant foot pain from corn.  I explained there was nothing else to offer her in the ED for this.  She has taken tylenol, percocet without relief at home.  There is no indication for antibiotics.  I offered her a cam walker for comfort at work but she declined. Offered her a work note but she also declined. Recommended podiatry follow up but states she probably can't afford it.  Return precautions given.   Final Clinical Impressions(s) / ED Diagnoses   Final diagnoses:   Tinea pedis of both feet    ED Discharge Orders         Ordered    ketoconazole (NIZORAL) 2 % cream  Daily     09/28/18 0945           Kinnie Feil, PA-C 09/28/18 3235    Charlesetta Shanks, MD 10/12/18 1415

## 2018-09-28 NOTE — ED Triage Notes (Signed)
Pt is having right foot pain X1 week. Pt thinks she was stung by a bee. Pt tried removing stinger with no success.

## 2019-01-12 ENCOUNTER — Other Ambulatory Visit: Payer: Self-pay

## 2019-01-12 ENCOUNTER — Emergency Department (HOSPITAL_COMMUNITY)
Admission: EM | Admit: 2019-01-12 | Discharge: 2019-01-12 | Disposition: A | Discharge status: Home / Self Care | Payer: BC Managed Care – PPO | Attending: Emergency Medicine | Admitting: Emergency Medicine

## 2019-01-12 ENCOUNTER — Encounter (HOSPITAL_COMMUNITY): Payer: Self-pay | Admitting: *Deleted

## 2019-01-12 DIAGNOSIS — Z79899 Other long term (current) drug therapy: Secondary | ICD-10-CM | POA: Diagnosis not present

## 2019-01-12 DIAGNOSIS — F17210 Nicotine dependence, cigarettes, uncomplicated: Secondary | ICD-10-CM | POA: Insufficient documentation

## 2019-01-12 DIAGNOSIS — R51 Headache: Secondary | ICD-10-CM | POA: Diagnosis not present

## 2019-01-12 DIAGNOSIS — I10 Essential (primary) hypertension: Secondary | ICD-10-CM | POA: Insufficient documentation

## 2019-01-12 DIAGNOSIS — R519 Headache, unspecified: Secondary | ICD-10-CM

## 2019-01-12 MED ORDER — DIPHENHYDRAMINE HCL 50 MG/ML IJ SOLN
12.5000 mg | Freq: Once | INTRAMUSCULAR | Status: AC
  Administered 2019-01-12: 17:00:00 12.5 mg via INTRAVENOUS
  Filled 2019-01-12: qty 1

## 2019-01-12 MED ORDER — METOCLOPRAMIDE HCL 5 MG/ML IJ SOLN
10.0000 mg | Freq: Once | INTRAMUSCULAR | Status: AC
  Administered 2019-01-12: 17:00:00 10 mg via INTRAVENOUS
  Filled 2019-01-12: qty 2

## 2019-01-12 MED ORDER — BUTALBITAL-APAP-CAFFEINE 50-325-40 MG PO TABS: 1.0000 | ORAL_TABLET | Freq: Four times a day (QID) | ORAL | 0 refills | Status: DC | PRN

## 2019-01-12 MED ORDER — SODIUM CHLORIDE 0.9 % IV BOLUS
500.0000 mL | Freq: Once | INTRAVENOUS | Status: AC
  Administered 2019-01-12: 500 mL via INTRAVENOUS

## 2019-01-12 MED ORDER — ACETAMINOPHEN 500 MG PO TABS
1000.0000 mg | ORAL_TABLET | Freq: Once | ORAL | Status: AC
  Administered 2019-01-12: 1000 mg via ORAL
  Filled 2019-01-12: qty 2

## 2019-01-12 MED ORDER — KETOROLAC TROMETHAMINE 15 MG/ML IJ SOLN
30.0000 mg | Freq: Once | INTRAMUSCULAR | Status: AC
  Administered 2019-01-12: 17:00:00 30 mg via INTRAVENOUS
  Filled 2019-01-12: qty 2

## 2019-01-12 MED ORDER — DEXAMETHASONE SODIUM PHOSPHATE 10 MG/ML IJ SOLN
10.0000 mg | Freq: Once | INTRAMUSCULAR | Status: AC
  Administered 2019-01-12: 17:00:00 10 mg via INTRAVENOUS
  Filled 2019-01-12: qty 1

## 2019-01-12 NOTE — Discharge Instructions (Signed)
You were seen in the ER for headache.  Your headache improved with medicines here in the ER.  Alternate Excedrin as needed for headaches in the future.  If this does not help he can take Fioricet as prescribed only for breakthrough headaches.  Your blood pressure was likely elevated initially due to the pain for the last 4 days.  This also improved here with improvement of the headache.  Follow-up with neurology (headache specialist) for further discussion of your recurrent headaches, they may be able to give you prescription of medicines to prevent medicines or to stop medicines.

## 2019-01-12 NOTE — ED Triage Notes (Signed)
Pt reports migraine HA since Thursday. Pt also reports nausea.

## 2019-01-12 NOTE — ED Provider Notes (Signed)
[place] EMERGENCY DEPARTMENT Provider Note   CSN: [id number] Arrival date & time: 01/12/19  1536     History   Chief Complaint Chief Complaint  Patient presents with  . Headache    HPI Tabitha Holland is a 33 y.o. female with history of poorly controlled hypertension, morbid obesity, chronic headaches presents to the ER for evaluation of headache that began 4 days ago.  Describes it as initially mild, gradually worsening now moderate to severe, constant.  Located to the forehead and frontal part of her head.  It is worse with opening her eyes, sitting, walking, moving.  She has had associated photophobia, phonophobia, nausea and vomiting.  She took Excedrin initially which helped but over the last couple of days this is not been helping her pain.  Reports having history of headaches in the past that have felt similarly but usually they go away in just a couple of days and with Excedrin.  Has noticed that lately her blood pressure slightly higher despite being compliant with her 3 antihypertensive medicines.  States at home on a good day her blood pressures are in your routine 99991111 systolic and in the low 123XX123 diastolic.  Upon checking her blood pressure is 199/104.  She denies any head trauma, anticoagulant use.  She denies any fever, neck rigidity.  No associated vision changes, difficulty swallowing, difficulty with speech, difficulty with balance, unilateral weakness or loss of sensation.     HPI  Past Medical History:  Diagnosis Date  . Hx of wisdom tooth extraction   . Hypertension   . Morbid obesity [facility])     Patient Active Problem List   Diagnosis Date Noted  . Previous cesarean section 05/26/2018  . BMI 45.0-49.9, adult ([place]) 05/26/2018  . Marijuana abuse 05/26/2018    Past Surgical History:  Procedure Laterality Date  . CESAREAN SECTION N/A 10/28/2013   Procedure: CESAREAN SECTION;  Surgeon: Emily Filbert, MD;  Location: [place] [facility];  Service:  Obstetrics;  Laterality: N/A;  . CESAREAN SECTION N/A 09/15/2015   Procedure: CESAREAN SECTION;  Surgeon: Mora Bellman, MD;  Location: [place];  Service: Obstetrics;  Laterality: N/A;  . CESAREAN SECTION N/A 05/26/2018   Procedure: CESAREAN SECTION;  Surgeon: Woodroe Mode, MD;  Location: [place];  Service: Obstetrics;  Laterality: N/A;  . CHOLECYSTECTOMY    . WISDOM TOOTH EXTRACTION       OB History    Gravida  5   Para  5   Term  5   Preterm  0   AB  0   Living  5     SAB  0   TAB  0   Ectopic  0   Multiple  0   Live Births  5            Home Medications    Prior to Admission medications   Medication Sig Start Date End Date Taking? Authorizing Provider  amLODipine (NORVASC) 10 MG tablet Take 1 tablet (10 mg total) by mouth daily. 05/29/18   Constant, Peggy, MD  butalbital-acetaminophen-caffeine (FIORICET) [phone] MG tablet Take 1-2 tablets by mouth every 6 (six) hours as needed for headache. 01/12/19 01/12/20  Kinnie Feil, PA-C  citalopram (CELEXA) 20 MG tablet Take 1 tablet (20 mg total) by mouth daily. 07/03/18   Woodroe Mode, MD  docusate sodium (COLACE) 100 MG capsule Take 1 capsule (100 mg total) by mouth 2 (two) times daily. Patient not taking:  Reported on 06/10/2018 06/05/18   [name], MD  ferrous sulfate 325 (65 FE) MG tablet Take 1 tablet (325 mg total) by mouth daily with breakfast. 06/20/18   Woodroe Mode, MD  hydrochlorothiazide (HYDRODIURIL) 25 MG tablet Take 1 tablet (25 mg total) by mouth daily. 05/29/18   Constant, Peggy, MD  ibuprofen (ADVIL,MOTRIN) 600 MG tablet Take 1 tablet (600 mg total) by mouth every 6 (six) hours as needed. 05/29/18   Constant, Peggy, MD  ketoconazole (NIZORAL) 2 % cream Apply 1 application topically daily. 09/28/18   Kinnie Feil, PA-C  lisinopril (PRINIVIL,ZESTRIL) 40 MG tablet Take 1 tablet (40 mg total) by mouth daily. 05/29/18   Constant, Peggy, MD  oxyCODONE-acetaminophen  (PERCOCET/ROXICET) 5-325 MG tablet Take 1 tablet by mouth every 6 (six) hours as needed. Patient not taking: Reported on 06/05/2018 05/29/18   Constant, Peggy, MD  Prenatal Vit-Fe Fumarate-FA (PRENATAL MULTIVITAMIN) TABS tablet Take 1 tablet by mouth daily at 12 noon.    [provider]    Family History Family History  Problem Relation Age of Onset  . Hypertension Sister     Social History Social History   Tobacco Use  . Smoking status: Current Every Day Smoker    Packs/day: 0.25    Types: Cigarettes  . Smokeless tobacco: Never Used  Substance Use Topics  . Alcohol use: Not Currently    Comment: occasional  . Drug use: Yes    Types: Marijuana     Allergies   Codeine, Hydrocodone, and Percocet [oxycodone-acetaminophen]   Review of Systems Review of Systems  Eyes: Positive for photophobia.  Gastrointestinal: Positive for nausea and vomiting.  Neurological: Positive for headaches.       Phonophobia   All other systems reviewed and are negative.    Physical Exam Updated Vital Signs BP (!) 183/110 (BP Location: Right Wrist)   Pulse 61   Temp 98.1 F (36.7 C) (Oral)   Resp 20   Ht 5\' 11"  (1.803 m)   Wt (!) 170 kg   LMP 01/07/2019   SpO2 98%   Breastfeeding No   BMI 52.27 kg/m   Physical Exam Vitals signs and nursing note reviewed.  Constitutional:      General: She is not in acute distress.    Appearance: She is well-developed.     Comments: NAD.  Morbidly obese female.  She has a towel over her face that she keeps during the exam states the lights hurt her eyes.  Cooperative.  HENT:     Head: Normocephalic and atraumatic.     Comments: Diffuse temporal, forehead, coronal scalp tenderness without edema, erythema, fluctuance, signs of trauma    Right Ear: External ear normal.     Left Ear: External ear normal.     Nose: Nose normal.  Eyes:     General: No scleral icterus.    Conjunctiva/sclera: Conjunctivae normal.  Neck:     Musculoskeletal:  Normal range of motion and neck supple.     Comments: Bilateral cervical paraspinal muscular tenderness, bilateral trapezius tenderness.  No midline C-spine tenderness.  No rigidity. Cardiovascular:     Rate and Rhythm: Normal rate and regular rhythm.     Heart sounds: Normal heart sounds.  Pulmonary:     Effort: Pulmonary effort is normal.     Breath sounds: Normal breath sounds.  Musculoskeletal: Normal range of motion.        General: No deformity.  Skin:    General: Skin is  warm and dry.     Capillary Refill: Capillary refill takes less than 2 seconds.  Neurological:     Mental Status: She is alert and oriented to person, place, and time.     Comments:  Alert and oriented to self, place, time and event.  Speech is fluent and clear without dysarthria or dysphasia. Strength 5/5 in upper/lower extremities.   Sensation to light touch intact in face, upper/lower extremities. Sits on side of the bed without truncal sway No pronator drift. No leg drop. Normal finger-to-nose.  CN I not tested CN II grossly intact visual fields bilaterally. Unable to visualize posterior eye. CN III, IV, VI PEERL and EOMs intact bilaterally CN V light touch intact in all 3 divisions of trigeminal nerve CN VII facial movements symmetric CN VIII intact to voice/conversation CN IX, X no uvula deviation, symmetric rise of soft palate  CN XI 5/5 SCM and trapezius strength bilaterally  CN XII Midline tongue protrusion, symmetric L/R movements  Psychiatric:        Behavior: Behavior normal.        Thought Content: Thought content normal.        Judgment: Judgment normal.      ED Treatments / Results  Labs (all labs ordered are listed, but only abnormal results are displayed) Labs Reviewed - No data to display  EKG None  Radiology No results found.  Procedures Procedures (including critical care time)  Medications Ordered in ED Medications  ketorolac (TORADOL) 15 MG/ML injection 30 mg (30 mg  Intravenous Given 01/12/19 1654)  metoCLOPramide (REGLAN) injection 10 mg (10 mg Intravenous Given 01/12/19 1655)  dexamethasone (DECADRON) injection 10 mg (10 mg Intravenous Given 01/12/19 1654)  diphenhydrAMINE (BENADRYL) injection 12.5 mg (12.5 mg Intravenous Given 01/12/19 1654)  sodium chloride 0.9 % bolus 500 mL (0 mLs Intravenous Stopped 01/12/19 1840)  acetaminophen (TYLENOL) tablet 1,000 mg (1,000 mg Oral Given 01/12/19 1843)     Initial Impression / Assessment and Plan / ED Course  I have reviewed the triage vital signs and the nursing notes.  Pertinent labs & imaging results that were available during my care of the patient were reviewed by me and considered in my medical decision making (see chart for details).  Clinical Course as of Jan 11 1914  Sun Jan 12, 2019  1843 BP(!): 199/104 [CG]  1844 BP(!): 183/110 [CG]    Clinical Course User Index [CG] Kinnie Feil, PA-C   33 year old with history of hypertension, chronic headaches here with headache for the last 4 days.  Highest on DDX includes primary headache such as tension type headache, uncomplicated migraine.  She has no high risk features of headache including thunderclap headache, head trauma, anticoagulant use, neuro deficits, fever, meningismus on exam.  I doubt meningitis, space-occupying lesion, stroke, dissection, temporal arteritis.  She arrives hypertensive but this is actually her baseline.  States that at home her blood pressure is usually 123XX123 systolic and 123XX123 diastolic.  Upon arrival her blood pressure is 199/104.  Given gradual onset of headache, minimally elevated BP I suspect her elevated BP today secondary to pain.  I did consider intracranial hemorrhage and obtaining a CT scan.  We will give migraine medicine, recheck blood pressure and pain level and reassess.  Consider head CT if persistent hypertension.  Patient was reevaluated.  Reports mild/moderate improvement in her headache.  Her systolic blood  pressure has improved now in the 180s.  Discussed patient that my suspicion for intracranial hemorrhage was low  and suspect her elevated blood pressure was secondary to pain.  She agreed with this and was comfortable deferring CT imaging.  I think this is reasonable.  Will DC with Fioricet as needed.  Given her chronicity of headaches I encouraged her to follow-up with neurology.  Return precautions given.  Patient is comfortable with this. Final Clinical Impressions(s) / ED Diagnoses   Final diagnoses:  Frontal headache    ED Discharge Orders         Ordered    butalbital-acetaminophen-caffeine (FIORICET) 50-325-40 MG tablet  Every 6 hours PRN     01/12/19 1845           Kinnie Feil, PA-C 01/12/19 1915    Blanchie Dessert, MD 01/12/19 2046

## 2019-01-12 NOTE — ED Notes (Signed)
Patient verbalizes understanding of discharge instructions . Opportunity for questions and answers were provided . Armband removed by staff ,Pt discharged from ED. W/C  offered at D/C  and Declined W/C at D/C and was escorted to lobby by RN.  

## 2019-02-07 ENCOUNTER — Encounter (HOSPITAL_COMMUNITY): Payer: Self-pay

## 2019-02-07 ENCOUNTER — Other Ambulatory Visit: Payer: Self-pay

## 2019-02-07 ENCOUNTER — Emergency Department (HOSPITAL_COMMUNITY)
Admission: EM | Admit: 2019-02-07 | Discharge: 2019-02-07 | Disposition: A | Discharge status: Home / Self Care | Payer: BC Managed Care – PPO | Attending: Emergency Medicine | Admitting: Emergency Medicine

## 2019-02-07 DIAGNOSIS — Z79899 Other long term (current) drug therapy: Secondary | ICD-10-CM | POA: Diagnosis not present

## 2019-02-07 DIAGNOSIS — M549 Dorsalgia, unspecified: Secondary | ICD-10-CM

## 2019-02-07 DIAGNOSIS — Z885 Allergy status to narcotic agent status: Secondary | ICD-10-CM | POA: Insufficient documentation

## 2019-02-07 DIAGNOSIS — T148XXA Other injury of unspecified body region, initial encounter: Secondary | ICD-10-CM

## 2019-02-07 DIAGNOSIS — I10 Essential (primary) hypertension: Secondary | ICD-10-CM | POA: Diagnosis not present

## 2019-02-07 DIAGNOSIS — F17210 Nicotine dependence, cigarettes, uncomplicated: Secondary | ICD-10-CM | POA: Insufficient documentation

## 2019-02-07 DIAGNOSIS — M546 Pain in thoracic spine: Secondary | ICD-10-CM | POA: Diagnosis not present

## 2019-02-07 LAB — URINALYSIS, ROUTINE W REFLEX MICROSCOPIC
Bilirubin Urine: NEGATIVE
Glucose, UA: NEGATIVE mg/dL
Hgb urine dipstick: NEGATIVE
Ketones, ur: NEGATIVE mg/dL
Nitrite: NEGATIVE
Protein, ur: NEGATIVE mg/dL
Specific Gravity, Urine: 1.02 (ref 1.005–1.030)
pH: 7 (ref 5.0–8.0)

## 2019-02-07 LAB — URINALYSIS, MICROSCOPIC (REFLEX)

## 2019-02-07 LAB — PREGNANCY, URINE: Preg Test, Ur: NEGATIVE

## 2019-02-07 MED ORDER — LISINOPRIL 20 MG PO TABS
40.0000 mg | ORAL_TABLET | Freq: Once | ORAL | Status: AC
  Administered 2019-02-07: 40 mg via ORAL
  Filled 2019-02-07: qty 2

## 2019-02-07 MED ORDER — HYDROCHLOROTHIAZIDE 25 MG PO TABS
25.0000 mg | ORAL_TABLET | Freq: Once | ORAL | Status: AC
  Administered 2019-02-07: 25 mg via ORAL
  Filled 2019-02-07: qty 1

## 2019-02-07 MED ORDER — KETOROLAC TROMETHAMINE 60 MG/2ML IM SOLN
30.0000 mg | Freq: Once | INTRAMUSCULAR | Status: AC
  Administered 2019-02-07: 13:00:00 30 mg via INTRAMUSCULAR
  Filled 2019-02-07: qty 2

## 2019-02-07 MED ORDER — DIAZEPAM 5 MG PO TABS
5.0000 mg | ORAL_TABLET | Freq: Once | ORAL | Status: AC
  Administered 2019-02-07: 5 mg via ORAL
  Filled 2019-02-07: qty 1

## 2019-02-07 MED ORDER — METHOCARBAMOL 500 MG PO TABS: 500.0000 mg | ORAL_TABLET | Freq: Two times a day (BID) | ORAL | 0 refills | Status: DC

## 2019-02-07 MED ORDER — DIAZEPAM 5 MG/ML IJ SOLN
5.0000 mg | Freq: Once | INTRAMUSCULAR | Status: AC
  Administered 2019-02-07: 13:00:00 5 mg via INTRAMUSCULAR
  Filled 2019-02-07: qty 2

## 2019-02-07 MED ORDER — AMLODIPINE BESYLATE 5 MG PO TABS
10.0000 mg | ORAL_TABLET | Freq: Once | ORAL | Status: AC
  Administered 2019-02-07: 15:00:00 10 mg via ORAL
  Filled 2019-02-07: qty 2

## 2019-02-07 MED ORDER — DEXAMETHASONE SODIUM PHOSPHATE 10 MG/ML IJ SOLN
10.0000 mg | Freq: Once | INTRAMUSCULAR | Status: AC
  Administered 2019-02-07: 10 mg via INTRAMUSCULAR
  Filled 2019-02-07: qty 1

## 2019-02-07 NOTE — ED Triage Notes (Signed)
Pt reports constant, left sided, mid-lower back pain, onset Tuesday morning. Denies injury. Denies dysuria, denies hematuria. She also reports right ankle pain "it hurts to walk on." No swelling, no recent injury.

## 2019-02-07 NOTE — ED Notes (Signed)
EDP aware of bp and she counseled pt on taking meds.

## 2019-02-07 NOTE — ED Notes (Signed)
Patient verbalizes understanding of discharge instructions. Opportunity for questioning and answers were provided. Armband removed by staff, pt discharged from ED.  

## 2019-02-07 NOTE — ED Provider Notes (Signed)
[place] [facility] EMERGENCY DEPARTMENT Provider Note   CSN: [id number] Arrival date & time: 02/07/19  1149     History   Chief Complaint Chief Complaint  Patient presents with  . Back Pain    HPI Tabitha Holland is a 33 y.o. female smoke with her hypertension, morbid obesity who presents for evaluation of left-sided back pain that began about 4 days ago.  She states that she first started noticing pain when she woke up.  She states that since then, is gotten progressively worse.  She states it is worse when she tries to move, bed in particular when she tries to get out of bed.  She states that the pain is also worsened by walking but she has been able to ambulate and bear weight.  Reports taking Tylenol, ibuprofen, Excedrin with no improvement in pain.  She states she did not have any preceding trauma, injury, fall.  She does not recall lifting anything specifically heavy but does state that she works around [employer] and does often have to lift them.  She states that the pain does not radiate.  She denies any associated difficulty breathing, chest pain.  She has not had any abdominal pain, nausea/vomiting.  She denies any urinary complaints.  Patient also reports that this morning, her right ankle started hurting her.  She describes it as "feeling like it is locked."  She has been able to ambulate and bear weight.  No overlying warmth, erythema, swelling.  Denies any injury.  Patient denies any fevers. Denies fevers, weight loss, numbness/weakness of upper and lower extremities, bowel/bladder incontinence, saddle anesthesia, history of back surgery, history of IVDA. She denies any OCP use, recent immobilization, prior history of DVT/PE, recent surgery, leg swelling, or long travel.     The history is provided by the patient.    Past Medical History:  Diagnosis Date  . Hx of wisdom tooth extraction   . Hypertension   . Morbid obesity [facility])     Patient Active Problem List   Diagnosis Date Noted  . Previous cesarean section 05/26/2018  . BMI 45.0-49.9, adult ([place]) 05/26/2018  . Marijuana abuse 05/26/2018    Past Surgical History:  Procedure Laterality Date  . CESAREAN SECTION N/A 10/28/2013   Procedure: CESAREAN SECTION;  Surgeon: Emily Filbert, MD;  Location: [place] [facility];  Service: Obstetrics;  Laterality: N/A;  . CESAREAN SECTION N/A 09/15/2015   Procedure: CESAREAN SECTION;  Surgeon: Mora Bellman, MD;  Location: [place];  Service: Obstetrics;  Laterality: N/A;  . CESAREAN SECTION N/A 05/26/2018   Procedure: CESAREAN SECTION;  Surgeon: Woodroe Mode, MD;  Location: [place];  Service: Obstetrics;  Laterality: N/A;  . CHOLECYSTECTOMY    . WISDOM TOOTH EXTRACTION       OB History    Gravida  5   Para  5   Term  5   Preterm  0   AB  0   Living  5     SAB  0   TAB  0   Ectopic  0   Multiple  0   Live Births  5            Home Medications    Prior to Admission medications   Medication Sig Start Date End Date Taking? Authorizing Provider  amLODipine (NORVASC) 10 MG tablet Take 1 tablet (10 mg total) by mouth daily. 05/29/18   Constant, Peggy, MD  butalbital-acetaminophen-caffeine (FIORICET) 50-325-40 MG tablet Take 1-2  tablets by mouth every 6 (six) hours as needed for headache. 01/12/19 01/12/20  Kinnie Feil, PA-C  citalopram (CELEXA) 20 MG tablet Take 1 tablet (20 mg total) by mouth daily. 07/03/18   Woodroe Mode, MD  docusate sodium (COLACE) 100 MG capsule Take 1 capsule (100 mg total) by mouth 2 (two) times daily. Patient not taking: Reported on 06/10/2018 06/05/18   [name], MD  ferrous sulfate 325 (65 FE) MG tablet Take 1 tablet (325 mg total) by mouth daily with breakfast. 06/20/18   Woodroe Mode, MD  hydrochlorothiazide (HYDRODIURIL) 25 MG tablet Take 1 tablet (25 mg total) by mouth daily. 05/29/18   Constant, Peggy, MD  ibuprofen (ADVIL,MOTRIN) 600 MG tablet Take 1 tablet (600 mg total) by mouth  every 6 (six) hours as needed. 05/29/18   Constant, Peggy, MD  ketoconazole (NIZORAL) 2 % cream Apply 1 application topically daily. 09/28/18   Kinnie Feil, PA-C  lisinopril (PRINIVIL,ZESTRIL) 40 MG tablet Take 1 tablet (40 mg total) by mouth daily. 05/29/18   Constant, Peggy, MD  methocarbamol (ROBAXIN) 500 MG tablet Take 1 tablet (500 mg total) by mouth 2 (two) times daily. 02/07/19   Volanda Napoleon, PA-C  oxyCODONE-acetaminophen (PERCOCET/ROXICET) 5-325 MG tablet Take 1 tablet by mouth every 6 (six) hours as needed. Patient not taking: Reported on 06/05/2018 05/29/18   Constant, Peggy, MD  Prenatal Vit-Fe Fumarate-FA (PRENATAL MULTIVITAMIN) TABS tablet Take 1 tablet by mouth daily at 12 noon.    [provider]    Family History Family History  Problem Relation Age of Onset  . Hypertension Sister     Social History Social History   Tobacco Use  . Smoking status: Current Every Day Smoker    Packs/day: 0.25    Types: Cigarettes  . Smokeless tobacco: Never Used  Substance Use Topics  . Alcohol use: Not Currently    Comment: occasional  . Drug use: Yes    Types: Marijuana     Allergies   Codeine, Hydrocodone, and Percocet [oxycodone-acetaminophen]   Review of Systems Review of Systems  Constitutional: Negative for fever.  Respiratory: Negative for shortness of breath.   Cardiovascular: Negative for chest pain.  Gastrointestinal: Negative for abdominal pain, nausea and vomiting.  Genitourinary: Negative for dysuria and hematuria.  Musculoskeletal: Positive for back pain.  Neurological: Negative for weakness and numbness.  All other systems reviewed and are negative.    Physical Exam Updated Vital Signs BP (!) 193/125   Pulse (!) 57   Temp 98.4 F (36.9 C) (Oral)   Resp 16   Ht 5\' 11"  (1.803 m)   Wt (!) 145.2 kg   LMP 01/24/2019   SpO2 100%   BMI 44.63 kg/m   Physical Exam Vitals signs and nursing note reviewed.  Constitutional:       Appearance: She is well-developed.  HENT:     Head: Normocephalic and atraumatic.  Eyes:     General: No scleral icterus.       Right eye: No discharge.        Left eye: No discharge.     Conjunctiva/sclera: Conjunctivae normal.  Neck:     Musculoskeletal: Normal range of motion.     Comments: No midline T-spine tenderness.  No deformity or crepitus noted. Cardiovascular:     Pulses:          Radial pulses are 2+ on the right side and 2+ on the left side.  Dorsalis pedis pulses are 2+ on the right side and 2+ on the left side.  Pulmonary:     Effort: Pulmonary effort is normal.     Comments: Lungs clear to auscultation bilaterally.  Symmetric chest rise.  No wheezing, rales, rhonchi. Abdominal:     Comments: Abdomen is soft, non-distended, non-tender. No rigidity, No guarding. No peritoneal signs.  Musculoskeletal:     Thoracic back: She exhibits no tenderness.     Lumbar back: She exhibits no tenderness.       Back:     Comments: Point tenderness with overlying spasm noted to the mid/lower paraspinal muscles of the T-spine region.  No midline tenderness.  No deformity or crepitus noted.  No bony tenderness noted to right ankle.  No overlying warmth, erythema, edema.  No deformity or crepitus noted.  Skin:    General: Skin is warm and dry.     Comments: No overlying rash noted to the thoracic spine. Good distal cap refill.  BLE is not dusky in appearance or cool to touch.  Neurological:     Mental Status: She is alert.     Comments: Follows commands, Moves all extremities  5/5 strength to BUE and BLE  Sensation intact throughout all major nerve distributions  Psychiatric:        Speech: Speech normal.        Behavior: Behavior normal.      ED Treatments / Results  Labs (all labs ordered are listed, but only abnormal results are displayed) Labs Reviewed  URINALYSIS, ROUTINE W REFLEX MICROSCOPIC - Abnormal; Notable for the following components:      Result Value    Leukocytes,Ua SMALL (*)    All other components within normal limits  URINALYSIS, MICROSCOPIC (REFLEX) - Abnormal; Notable for the following components:   Bacteria, UA FEW (*)    All other components within normal limits  PREGNANCY, URINE    EKG None  Radiology No results found.  Procedures Procedures (including critical care time)  Medications Ordered in ED Medications  ketorolac (TORADOL) injection 30 mg (30 mg Intramuscular Given 02/07/19 1322)  diazepam (VALIUM) injection 5 mg (5 mg Intramuscular Given 02/07/19 1319)  dexamethasone (DECADRON) injection 10 mg (10 mg Intramuscular Given 02/07/19 1323)  amLODipine (NORVASC) tablet 10 mg (10 mg Oral Given 02/07/19 1445)  hydrochlorothiazide (HYDRODIURIL) tablet 25 mg (25 mg Oral Given 02/07/19 1445)  lisinopril (ZESTRIL) tablet 40 mg (40 mg Oral Given 02/07/19 1528)  diazepam (VALIUM) tablet 5 mg (5 mg Oral Given 02/07/19 1528)     Initial Impression / Assessment and Plan / ED Course  I have reviewed the triage vital signs and the nursing notes.  Pertinent labs & imaging results that were available during my care of the patient were reviewed by me and considered in my medical decision making (see chart for details).        33 year old female who presents for evaluation of back pain x4 days.  No preceding trauma, injury.  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  No fevers, neuro deficits, saddle anesthesia, urinary or bowel incontinence.  No chest pain, difficulty breathing, urinary complaints.  Initial ED arrival, she is afebrile, nontoxic-appearing.  She does appear uncomfortable but no acute distress.  She is hypertensive on vitals.  She does have a history of hypertension and states she is on 3 antihypertensive.  She reports that she did not take them this morning.  But is otherwise stable.  On exam she has point  tenderness noted to the paraspinal muscles of the mid/lower thoracic region on the left side.  No midline bony  tenderness.  Consider musculoskeletal strain/mechanical back pain.  History/physical exam not concerning for cauda equina, spinal abscess.  History/physical exam not concerning for abdominal, GU etiology. History/physical exam not concerning for ACS etiology, aortic dissection, PE. Patient is PERC negative and is low risk for PE. Plan for analgesics and reassess.  At this time, given lack of trauma, do not feel that she needs imaging.  Patient is in agreement.  Reevaluation.  Patient reports improvement in pain after analgesics.  She is able to get up out of the wheelchair and walk.  Blood pressure still elevated but I suspect this is because she has not taken any of her medications.  We will give her home medications and reassess.  Reevaluation's.  Patient's blood pressure improved slightly.  Patient denies any symptoms at this time.  No chest pain, difficulty breathing, headache, numbness/weakness of arms or legs.  Patient stable for discharge at this time.  RN inform you that patient blood pressure still elevated.  Will give her lisinopril and evaluate.  RN inform me that patient was ready to leave.  I discussed with patient.  She has no chest pain, difficulty breathing at this time.  Reports her pain in her back has improved.  She is ready to go home.  I discussed with patient regarding ensuring that she takes her home medications as directed. At this time, patient exhibits no emergent life-threatening condition that require further evaluation in ED or admiussion.  We will plan to treat her back pain is musculoskeletal in nature.  Will give short course of muscle relaxers. Patient had ample opportunity for questions and discussion. All patient's questions were answered with full understanding. Strict return precautions discussed. Patient expresses understanding and agreement to plan.   Portions of this note were generated with [employer]. Dictation errors may occur despite best attempts at  proofreading.   Final Clinical Impressions(s) / ED Diagnoses   Final diagnoses:  Acute left-sided back pain, unspecified back location  Muscle strain  Essential hypertension    ED Discharge Orders         Ordered    methocarbamol (ROBAXIN) 500 MG tablet  2 times daily     02/07/19 1510           Desma Mcgregor 02/07/19 2218    Charlesetta Shanks, MD 02/10/19 [id number]

## 2019-02-07 NOTE — ED Notes (Signed)
Lolita Patella PA informed of patient's elevated BP at 213/131

## 2019-02-07 NOTE — ED Notes (Signed)
Pt reports she has HTN but did not take her BP medication today.

## 2019-02-07 NOTE — Discharge Instructions (Signed)
You can take Tylenol or Ibuprofen as directed for pain. You can alternate Tylenol and Ibuprofen every 4 hours. If you take Tylenol at 1pm, then you can take Ibuprofen at 5pm. Then you can take Tylenol again at 9pm.   Take Robaxin as prescribed. This medication will make you drowsy so do not drive or drink alcohol when taking it.  As we discussed, it is very important for you to take your blood pressure medications.  Return to the Emergency Department immediately for any worsening back pain, chest pain, trouble breathing, neck pain, difficulty walking, numbness/weaknss of your arms or legs, urinary or bowel accidents, fever or any other worsening or concerning symptoms.

## 2019-04-13 ENCOUNTER — Emergency Department (HOSPITAL_COMMUNITY): Payer: BC Managed Care – PPO

## 2019-04-13 ENCOUNTER — Encounter (HOSPITAL_COMMUNITY): Payer: Self-pay | Admitting: Emergency Medicine

## 2019-04-13 ENCOUNTER — Emergency Department (HOSPITAL_COMMUNITY)
Admission: EM | Admit: 2019-04-13 | Discharge: 2019-04-13 | Disposition: A | Discharge status: Home / Self Care | Payer: BC Managed Care – PPO | Attending: Emergency Medicine | Admitting: Emergency Medicine

## 2019-04-13 ENCOUNTER — Other Ambulatory Visit: Payer: Self-pay

## 2019-04-13 DIAGNOSIS — S99911A Unspecified injury of right ankle, initial encounter: Secondary | ICD-10-CM | POA: Insufficient documentation

## 2019-04-13 DIAGNOSIS — Z79899 Other long term (current) drug therapy: Secondary | ICD-10-CM | POA: Diagnosis not present

## 2019-04-13 DIAGNOSIS — Y999 Unspecified external cause status: Secondary | ICD-10-CM | POA: Insufficient documentation

## 2019-04-13 DIAGNOSIS — I10 Essential (primary) hypertension: Secondary | ICD-10-CM | POA: Insufficient documentation

## 2019-04-13 DIAGNOSIS — F17210 Nicotine dependence, cigarettes, uncomplicated: Secondary | ICD-10-CM | POA: Diagnosis not present

## 2019-04-13 DIAGNOSIS — X509XXA Other and unspecified overexertion or strenuous movements or postures, initial encounter: Secondary | ICD-10-CM | POA: Insufficient documentation

## 2019-04-13 DIAGNOSIS — Y9289 Other specified places as the place of occurrence of the external cause: Secondary | ICD-10-CM | POA: Diagnosis not present

## 2019-04-13 DIAGNOSIS — Y9389 Activity, other specified: Secondary | ICD-10-CM | POA: Diagnosis not present

## 2019-04-13 MED ORDER — LISINOPRIL 40 MG PO TABS: 40.0000 mg | ORAL_TABLET | Freq: Every day | ORAL | 0 refills | Status: DC

## 2019-04-13 MED ORDER — HYDROCHLOROTHIAZIDE 25 MG PO TABS: 25.0000 mg | ORAL_TABLET | Freq: Every day | ORAL | 0 refills | Status: DC

## 2019-04-13 MED ORDER — AMLODIPINE BESYLATE 10 MG PO TABS: 10.0000 mg | ORAL_TABLET | Freq: Every day | ORAL | 0 refills | Status: DC

## 2019-04-13 NOTE — Discharge Instructions (Addendum)
You have been seen today for an ankle injury. There were no acute abnormalities on the x-rays, including no sign of fracture or dislocation, however, there could be injuries to the soft tissues, such as the ligaments or tendons that are not seen on xrays. There could also be what are called occult fractures that are small fractures not seen on xray. Antiinflammatory medications: Take 600 mg of ibuprofen every 6 hours or 440 mg (over the counter dose) to 500 mg (prescription dose) of naproxen every 12 hours for the next 3 days. After this time, these medications may be used as needed for pain. Take these medications with food to avoid upset stomach. Choose only one of these medications, do not take them together. Acetaminophen (generic for Tylenol): Should you continue to have additional pain while taking the ibuprofen or naproxen, you may add in acetaminophen as needed. Your daily total maximum amount of acetaminophen from all sources should be limited to 4000mg /day for persons without liver problems, or 2000mg /day for those with liver problems. Ice: May apply ice to the area over the next 24 hours for 15 minutes at a time to reduce swelling. Elevation: Keep the extremity elevated as often as possible to reduce pain and inflammation. Support: Wear the cam boot for support and comfort. Wear this until pain resolves. You will be weight-bearing as tolerated, which means you can slowly start to put weight on the extremity and increase amount and frequency as pain allows. Exercises: Start by performing these exercises a few times a week, increasing the frequency until you are performing them twice daily.  Follow up: If symptoms are improving, you may follow up with your primary care provider for any continued management. If symptoms are not starting to improve within a week, you should follow up with the orthopedic specialist within two weeks. Return: Return to the ED for numbness, weakness, increasing pain,  overall worsening symptoms, loss of function, or if symptoms are not improving, you have tried to follow up with the orthopedic specialist, and have been unable to do so.  For prescription assistance, may try using prescription discount sites or apps, such as goodrx.com

## 2019-04-13 NOTE — ED Notes (Signed)
Pt stated that BP is usually elevated.

## 2019-04-13 NOTE — ED Provider Notes (Signed)
[facility] EMERGENCY DEPARTMENT Provider Note   CSN: [id number] Arrival date & time: 04/13/19  1701     History Chief Complaint  Patient presents with  . Ankle Pain    Tabitha Holland is a 33 y.o. female.  HPI      Tabitha Holland is a 33 y.o. female, with a history of HTN and morbid obesity, presenting to the ED with right ankle injury that occurred yesterday. States she misstepped and "rolled" her right ankle.  She is experiencing throbbing pain around the lateral and medial malleoli, moderate to severe, nonradiating from this location.  Denies numbness, weakness, neck/back pain, knee pain, hip pain, or any other injuries or complaints.   Past Medical History:  Diagnosis Date  . Hx of wisdom tooth extraction   . Hypertension   . Morbid obesity [facility])     Patient Active Problem List   Diagnosis Date Noted  . Previous cesarean section 05/26/2018  . BMI 45.0-49.9, adult ([place]) 05/26/2018  . Marijuana abuse 05/26/2018    Past Surgical History:  Procedure Laterality Date  . CESAREAN SECTION N/A 10/28/2013   Procedure: CESAREAN SECTION;  Surgeon: Emily Filbert, MD;  Location: [place] [facility];  Service: Obstetrics;  Laterality: N/A;  . CESAREAN SECTION N/A 09/15/2015   Procedure: CESAREAN SECTION;  Surgeon: Mora Bellman, MD;  Location: [place];  Service: Obstetrics;  Laterality: N/A;  . CESAREAN SECTION N/A 05/26/2018   Procedure: CESAREAN SECTION;  Surgeon: Woodroe Mode, MD;  Location: [place];  Service: Obstetrics;  Laterality: N/A;  . CHOLECYSTECTOMY    . WISDOM TOOTH EXTRACTION       OB History    Gravida  5   Para  5   Term  5   Preterm  0   AB  0   Living  5     SAB  0   TAB  0   Ectopic  0   Multiple  0   Live Births  5           Family History  Problem Relation Age of Onset  . Hypertension Sister     Social History   Tobacco Use  . Smoking status: Current Every Day Smoker    Packs/day:  0.25    Types: Cigarettes  . Smokeless tobacco: Never Used  Substance Use Topics  . Alcohol use: Not Currently    Comment: occasional  . Drug use: Yes    Types: Marijuana    Home Medications Prior to Admission medications   Medication Sig Start Date End Date Taking? Authorizing Provider  amLODipine (NORVASC) 10 MG tablet Take 1 tablet (10 mg total) by mouth daily. 04/13/19   , Helane Gunther, PA-C  butalbital-acetaminophen-caffeine (FIORICET) [phone] MG tablet Take 1-2 tablets by mouth every 6 (six) hours as needed for headache. 01/12/19 01/12/20  Kinnie Feil, PA-C  citalopram (CELEXA) 20 MG tablet Take 1 tablet (20 mg total) by mouth daily. 07/03/18   Woodroe Mode, MD  docusate sodium (COLACE) 100 MG capsule Take 1 capsule (100 mg total) by mouth 2 (two) times daily. Patient not taking: Reported on 06/10/2018 06/05/18   [name], MD  ferrous sulfate 325 (65 FE) MG tablet Take 1 tablet (325 mg total) by mouth daily with breakfast. 06/20/18   Woodroe Mode, MD  hydrochlorothiazide (HYDRODIURIL) 25 MG tablet Take 1 tablet (25 mg total) by mouth daily. 04/13/19   , Helane Gunther, PA-C  ibuprofen (ADVIL,MOTRIN) 600 MG tablet Take 1 tablet (600 mg total) by mouth every 6 (six) hours as needed. 05/29/18   Constant, Peggy, MD  ketoconazole (NIZORAL) 2 % cream Apply 1 application topically daily. 09/28/18   Kinnie Feil, PA-C  lisinopril (ZESTRIL) 40 MG tablet Take 1 tablet (40 mg total) by mouth daily. 04/13/19   ,  C, PA-C  methocarbamol (ROBAXIN) 500 MG tablet Take 1 tablet (500 mg total) by mouth 2 (two) times daily. 02/07/19   Volanda Napoleon, PA-C  oxyCODONE-acetaminophen (PERCOCET/ROXICET) 5-325 MG tablet Take 1 tablet by mouth every 6 (six) hours as needed. Patient not taking: Reported on 06/05/2018 05/29/18   Constant, Peggy, MD  Prenatal Vit-Fe Fumarate-FA (PRENATAL MULTIVITAMIN) TABS tablet Take 1 tablet by mouth daily at 12 noon.    [provider]     Allergies    Codeine, Hydrocodone, and Percocet [oxycodone-acetaminophen]  Review of Systems   Review of Systems  Musculoskeletal: Positive for arthralgias and joint swelling. Negative for back pain and neck pain.  Neurological: Negative for weakness and numbness.    Physical Exam Updated Vital Signs BP (!) 171/103 (BP Location: Right Arm)   Pulse 73   Temp 98.9 F (37.2 C) (Oral)   Resp 20   LMP 04/07/2019   SpO2 99%   Physical Exam Vitals and nursing note reviewed.  Constitutional:      General: She is not in acute distress.    Appearance: She is well-developed. She is obese. She is not diaphoretic.  HENT:     Head: Normocephalic and atraumatic.  Eyes:     Conjunctiva/sclera: Conjunctivae normal.  Cardiovascular:     Rate and Rhythm: Normal rate and regular rhythm.     Pulses:          Dorsalis pedis pulses are 2+ on the right side.       Posterior tibial pulses are 2+ on the right side.  Pulmonary:     Effort: Pulmonary effort is normal.  Musculoskeletal:     Cervical back: Neck supple.     Right ankle: Swelling present. No deformity. Tenderness present over the lateral malleolus and medial malleolus.     Right Achilles Tendon: No tenderness.     Comments: Tenderness across the lateral and medial malleoli of the right ankle with some associated swelling.  No noted deformity, instability, or color change. No tenderness, swelling, or deformity to the wrist of the right lower leg.   No pain, tenderness, swelling, deformity, or color change to the right foot. No tenderness, swelling, or pain with range of motion to the right knee.  Skin:    General: Skin is warm and dry.     Coloration: Skin is not pale.  Neurological:     Mental Status: She is alert.     Comments: Sensation to light touch grossly intact in the right foot and toes. Appropriate motor function against resistance intact in the right toes.  Motor function intact in the right ankle, though painful.  Strength 5/5 in the right knee.  Psychiatric:        Behavior: Behavior normal.     ED Results / Procedures / Treatments   Labs (all labs ordered are listed, but only abnormal results are displayed) Labs Reviewed - No data to display  EKG None  Radiology DG Ankle Complete Right  Result Date: 04/13/2019 CLINICAL DATA:  Pt c/o R ankle pain on both sides around the malleoli. Pt states she twisted her  ankle yesterday and felt a "pop." Previous torn ligaments in the ankle as a child. EXAM: RIGHT ANKLE - COMPLETE 3+ VIEW COMPARISON:  None. FINDINGS: There is no evidence of acute fracture, dislocation, or joint effusion. There are a few a fossa fixed in CT's adjacent to the medial malleolus likely represent sequela of prior trauma. Talar dome intact. There is lateral soft tissue swelling. IMPRESSION: No acute osseous abnormality in the right ankle. Electronically Signed   By: Audie Pinto M.D.   On: 04/13/2019 17:49    Procedures Procedures (including critical care time)  Medications Ordered in ED Medications - No data to display  ED Course  I have reviewed the triage vital signs and the nursing notes.  Pertinent labs & imaging results that were available during my care of the patient were reviewed by me and considered in my medical decision making (see chart for details).    MDM Rules/Calculators/A&P                        Patient presents with a right ankle injury.  No acute abnormality on x-ray.  No evidence of neurovascular compromise.  Due to the patient's risk factors of morbid obesity and previous reported ligament tears to her ankle, patient was placed in a cam boot and given crutches.  She will follow-up with orthopedics.  Patient states she has previously been treated by Raliegh Ip. The patient was given instructions for home care as well as return precautions. Patient voices understanding of these instructions, accepts the plan, and is comfortable with discharge.    Patient's hypertension was discussed.  Patient states she ran out of her antihypertensive medications in the last few days.  She has plans to make an appointment for medication refills.  She is asymptomatic to her hypertension at this time.  We agreed upon a 30-day supply of her medications that would give her a chance to see her PCP as soon as possible, but after the holiday season if necessary.  Final Clinical Impression(s) / ED Diagnoses Final diagnoses:  Injury of right ankle, initial encounter    Rx / DC Orders ED Discharge Orders         Ordered    amLODipine (NORVASC) 10 MG tablet  Daily     04/13/19 1835    hydrochlorothiazide (HYDRODIURIL) 25 MG tablet  Daily     04/13/19 1835    lisinopril (ZESTRIL) 40 MG tablet  Daily     04/13/19 1835           Layla Maw 04/13/19 1844    Julianne Rice, MD 04/13/19 1956

## 2019-04-13 NOTE — ED Notes (Signed)
Pt d/c home per MD order. Discharge summary reviewed with pt, pt verbalizes understanding.

## 2019-04-13 NOTE — ED Triage Notes (Signed)
C/o R ankle pain and swelling since yesterday.  States she twisted her ankle yesterday.

## 2019-06-20 ENCOUNTER — Ambulatory Visit: Payer: BC Managed Care – PPO | Admitting: Obstetrics & Gynecology

## 2019-10-02 ENCOUNTER — Encounter (HOSPITAL_COMMUNITY): Payer: Self-pay | Admitting: Emergency Medicine

## 2019-10-02 ENCOUNTER — Ambulatory Visit (HOSPITAL_COMMUNITY)
Admission: RE | Admit: 2019-10-02 | Discharge: 2019-10-02 | Disposition: A | Discharge status: Home / Self Care | Payer: Medicaid Other | Attending: Psychiatry | Admitting: Psychiatry

## 2019-10-02 ENCOUNTER — Emergency Department (HOSPITAL_COMMUNITY)
Admission: EM | Admit: 2019-10-02 | Discharge: 2019-10-03 | Disposition: A | Discharge status: Home / Self Care | Payer: BC Managed Care – PPO | Attending: Emergency Medicine | Admitting: Emergency Medicine

## 2019-10-02 ENCOUNTER — Other Ambulatory Visit: Payer: Self-pay

## 2019-10-02 DIAGNOSIS — H538 Other visual disturbances: Secondary | ICD-10-CM | POA: Diagnosis not present

## 2019-10-02 DIAGNOSIS — F17210 Nicotine dependence, cigarettes, uncomplicated: Secondary | ICD-10-CM | POA: Diagnosis not present

## 2019-10-02 DIAGNOSIS — R079 Chest pain, unspecified: Secondary | ICD-10-CM | POA: Insufficient documentation

## 2019-10-02 DIAGNOSIS — I10 Essential (primary) hypertension: Secondary | ICD-10-CM | POA: Diagnosis not present

## 2019-10-02 DIAGNOSIS — Z79899 Other long term (current) drug therapy: Secondary | ICD-10-CM | POA: Insufficient documentation

## 2019-10-02 DIAGNOSIS — Z8659 Personal history of other mental and behavioral disorders: Secondary | ICD-10-CM

## 2019-10-02 DIAGNOSIS — F332 Major depressive disorder, recurrent severe without psychotic features: Secondary | ICD-10-CM | POA: Diagnosis present

## 2019-10-02 DIAGNOSIS — R45851 Suicidal ideations: Secondary | ICD-10-CM | POA: Diagnosis not present

## 2019-10-02 DIAGNOSIS — F32A Depression, unspecified: Secondary | ICD-10-CM

## 2019-10-02 HISTORY — DX: Personal history of other mental and behavioral disorders: Z86.59

## 2019-10-02 LAB — RAPID URINE DRUG SCREEN, HOSP PERFORMED
Amphetamines: NOT DETECTED
Barbiturates: NOT DETECTED
Benzodiazepines: NOT DETECTED
Cocaine: NOT DETECTED
Opiates: NOT DETECTED
Tetrahydrocannabinol: POSITIVE — AB

## 2019-10-02 LAB — COMPREHENSIVE METABOLIC PANEL
ALT: 11 U/L (ref 0–44)
AST: 12 U/L — ABNORMAL LOW (ref 15–41)
Albumin: 4 g/dL (ref 3.5–5.0)
Alkaline Phosphatase: 90 U/L (ref 38–126)
Anion gap: 11 (ref 5–15)
BUN: 6 mg/dL (ref 6–20)
CO2: 25 mmol/L (ref 22–32)
Calcium: 9 mg/dL (ref 8.9–10.3)
Chloride: 106 mmol/L (ref 98–111)
Creatinine, Ser: 0.69 mg/dL (ref 0.44–1.00)
GFR calc Af Amer: >60 mL/min (ref >60)
GFR calc non Af Amer: >60 mL/min (ref >60)
Glucose, Bld: 94 mg/dL (ref 70–99)
Potassium: 3.8 mmol/L (ref 3.5–5.1)
Sodium: 142 mmol/L (ref 135–145)
Total Bilirubin: 0.9 mg/dL (ref 0.3–1.2)
Total Protein: 7.9 g/dL (ref 6.5–8.1)

## 2019-10-02 LAB — CBC WITH DIFFERENTIAL/PLATELET
Abs Immature Granulocytes: 0.03 10*3/uL (ref 0.00–0.07)
Basophils Absolute: 0 10*3/uL (ref 0.0–0.1)
Basophils Relative: 0 %
Eosinophils Absolute: 0.1 10*3/uL (ref 0.0–0.5)
Eosinophils Relative: 1 %
HCT: 40 % (ref 36.0–46.0)
Hemoglobin: 12.8 g/dL (ref 12.0–15.0)
Immature Granulocytes: 0 %
Lymphocytes Relative: 26 %
Lymphs Abs: 2.9 10*3/uL (ref 0.7–4.0)
MCH: 28.4 pg (ref 26.0–34.0)
MCHC: 32 g/dL (ref 30.0–36.0)
MCV: 88.9 fL (ref 80.0–100.0)
Monocytes Absolute: 0.5 10*3/uL (ref 0.1–1.0)
Monocytes Relative: 4 %
Neutro Abs: 7.7 10*3/uL (ref 1.7–7.7)
Neutrophils Relative %: 69 %
Platelets: 221 10*3/uL (ref 150–400)
RBC: 4.5 MIL/uL (ref 3.87–5.11)
RDW: 13.5 % (ref 11.5–15.5)
WBC: 11.2 10*3/uL — ABNORMAL HIGH (ref 4.0–10.5)
nRBC: 0 % (ref 0.0–0.2)

## 2019-10-02 LAB — I-STAT BETA HCG BLOOD, ED (MC, WL, AP ONLY): I-stat hCG, quantitative: <5 m[IU]/mL (ref <5)

## 2019-10-02 LAB — SALICYLATE LEVEL: Salicylate Lvl: <7 mg/dL — ABNORMAL LOW (ref 7.0–30.0)

## 2019-10-02 LAB — TROPONIN I (HIGH SENSITIVITY)
Troponin I (High Sensitivity): 4 ng/L (ref <18)
Troponin I (High Sensitivity): 5 ng/L (ref <18)

## 2019-10-02 LAB — ETHANOL: Alcohol, Ethyl (B): <10 mg/dL (ref <10)

## 2019-10-02 LAB — ACETAMINOPHEN LEVEL: Acetaminophen (Tylenol), Serum: <10 ug/mL — ABNORMAL LOW (ref 10–30)

## 2019-10-02 MED ORDER — HYDROCHLOROTHIAZIDE 12.5 MG PO CAPS
25.0000 mg | ORAL_CAPSULE | Freq: Once | ORAL | Status: AC
  Administered 2019-10-02: 25 mg via ORAL
  Filled 2019-10-02: qty 2

## 2019-10-02 MED ORDER — HYDROCHLOROTHIAZIDE 25 MG PO TABS
25.0000 mg | ORAL_TABLET | Freq: Every day | ORAL | 0 refills | Status: DC
Start: 2019-10-02 — End: 2019-10-14

## 2019-10-02 MED ORDER — AMLODIPINE BESYLATE 5 MG PO TABS
5.0000 mg | ORAL_TABLET | Freq: Every day | ORAL | Status: DC
  Administered 2019-10-03: 5 mg via ORAL
  Filled 2019-10-02: qty 1

## 2019-10-02 MED ORDER — AMLODIPINE BESYLATE 5 MG PO TABS: 5.0000 mg | ORAL_TABLET | Freq: Every day | ORAL | 0 refills | Status: DC

## 2019-10-02 MED ORDER — HYDROCHLOROTHIAZIDE 12.5 MG PO CAPS
25.0000 mg | ORAL_CAPSULE | Freq: Every day | ORAL | Status: DC
  Administered 2019-10-03: 25 mg via ORAL
  Filled 2019-10-02: qty 2

## 2019-10-02 MED ORDER — AMLODIPINE BESYLATE 5 MG PO TABS
5.0000 mg | ORAL_TABLET | Freq: Once | ORAL | Status: AC
  Administered 2019-10-02: 5 mg via ORAL
  Filled 2019-10-02: qty 1

## 2019-10-02 NOTE — ED Notes (Signed)
Patient arrived to unit very tearful.  She was cooperative in changing into scrubs but kept t-shirt on as scrub top did not fit.  Patient had me call her work and tell Sima Matas what was going on with her.

## 2019-10-02 NOTE — Progress Notes (Signed)
Patient came as walk-in accompanied with [employer] voluntarily due to suicidal thoughts. Patient stated, "I have several plans. Crying hysterically. Pt's BP extremely high 206/138 sitting and 195/115 standing. Patient reports being diagnosed several years ago with HTN but never used medication stating, "I couldn't afford medication". Education provided. Patient reports blurred vision and HA. RN notified Sharon Seller, NP, who came to speak to patient and recommended pt to be sent to [facility] for medical clearance. Patient consented to going to ED. Patient transported to [facility] via safe transport and staff Kennyth Lose) to accompany for safety.

## 2019-10-02 NOTE — Progress Notes (Signed)
Received Kynlei at the change of shift asleep in her bed with the sitter at the bedside. She was awaken to talk with TTS, then verbalize c/o sharp chest pains radiating to her back. Her VS was assessed and EKG done. No acute distress noted. Later she wanted to go home related to the fear of loosing her job. She was tearful for over one hour and unable to listen to this Probation officer. The MHT was able to calm her and she was allowed to call home and check on her children. TTS was notified for a reassessment in the AM. She eventually was able to calm herself down and drifted off to sleep and slept throughout the night.

## 2019-10-02 NOTE — ED Provider Notes (Signed)
[facility] DEPT Provider Note   CSN: [id number] Arrival date & time: 10/02/19  1532     History Chief Complaint  Patient presents with  . Hypertension  . Suicidal    sent from [place] is a 34 y.o. female presenting for evaluation of suicidal thoughts and worsening depression.  Patient states she has been very depressed for the past several months.  She states she tried to cut her wrists in January, but it hurt too much so she stopped.  She states in April she tried to overdose on medication, however fell asleep and woke up this appointment the next day.  Patient states of the past several days, she has been much more depressed than normal.  She has been crying for 24 hours straight.  She does not currently take any medications for her mental health.  She does not see a therapist.  Patient states she is being stalked, and that people are following her home from work and in the grocery store.  She reports SI, does not have a plan currently.  Denies HI or AVH.  She states she was in and out of hospitalizations and therapy as a child, but never felt this helped.  She states normally she is able to work through her depression, continue to take care of her kids in her house, however the past several days she has been unable to take care of her kids or herself.  She went to behavioral health to be evaluated, but was encouraged to come to the ED due to high blood pressure.  Patient reports a history of hypertension.  She does not take any medication for this.  She states she cannot afford it, she does not have a primary care doctor to refill it.  She reports intermittent chest pain over the past several months, none currently.  Intermittent vision blurriness, none currently.  She denies numbness or tingling.  She reports no other medical problems, does not take medications daily.   HPI     Past Medical History:  Diagnosis Date  . Hx of wisdom tooth  extraction   . Hypertension   . Morbid obesity [facility])     Patient Active Problem List   Diagnosis Date Noted  . Previous cesarean section 05/26/2018  . BMI 45.0-49.9, adult ([place]) 05/26/2018  . Marijuana abuse 05/26/2018    Past Surgical History:  Procedure Laterality Date  . CESAREAN SECTION N/A 10/28/2013   Procedure: CESAREAN SECTION;  Surgeon: Emily Filbert, MD;  Location: [place] [facility];  Service: Obstetrics;  Laterality: N/A;  . CESAREAN SECTION N/A 09/15/2015   Procedure: CESAREAN SECTION;  Surgeon: Mora Bellman, MD;  Location: [place];  Service: Obstetrics;  Laterality: N/A;  . CESAREAN SECTION N/A 05/26/2018   Procedure: CESAREAN SECTION;  Surgeon: Woodroe Mode, MD;  Location: [place];  Service: Obstetrics;  Laterality: N/A;  . CHOLECYSTECTOMY    . WISDOM TOOTH EXTRACTION       OB History    Gravida  5   Para  5   Term  5   Preterm  0   AB  0   Living  5     SAB  0   TAB  0   Ectopic  0   Multiple  0   Live Births  5           Family History  Problem Relation Age of Onset  . Hypertension Sister  Social History   Tobacco Use  . Smoking status: Current Every Day Smoker    Packs/day: 0.25    Types: Cigarettes  . Smokeless tobacco: Never Used  Substance Use Topics  . Alcohol use: Not Currently    Comment: occasional  . Drug use: Yes    Types: Marijuana    Home Medications Prior to Admission medications   Medication Sig Start Date End Date Taking? Authorizing Provider  amLODipine (NORVASC) 10 MG tablet Take 1 tablet (10 mg total) by mouth daily. 04/13/19   Joy, Helane Gunther, PA-C  butalbital-acetaminophen-caffeine (FIORICET) [phone] MG tablet Take 1-2 tablets by mouth every 6 (six) hours as needed for headache. 01/12/19 01/12/20  Kinnie Feil, PA-C  citalopram (CELEXA) 20 MG tablet Take 1 tablet (20 mg total) by mouth daily. 07/03/18   Woodroe Mode, MD  docusate sodium (COLACE) 100 MG capsule Take 1 capsule (100 mg  total) by mouth 2 (two) times daily. Patient not taking: Reported on 06/10/2018 06/05/18   [name], MD  ferrous sulfate 325 (65 FE) MG tablet Take 1 tablet (325 mg total) by mouth daily with breakfast. 06/20/18   Woodroe Mode, MD  hydrochlorothiazide (HYDRODIURIL) 25 MG tablet Take 1 tablet (25 mg total) by mouth daily. 04/13/19   Joy, Shawn C, PA-C  ibuprofen (ADVIL,MOTRIN) 600 MG tablet Take 1 tablet (600 mg total) by mouth every 6 (six) hours as needed. 05/29/18   Constant, Peggy, MD  ketoconazole (NIZORAL) 2 % cream Apply 1 application topically daily. 09/28/18   Kinnie Feil, PA-C  lisinopril (ZESTRIL) 40 MG tablet Take 1 tablet (40 mg total) by mouth daily. 04/13/19   Joy, Shawn C, PA-C  methocarbamol (ROBAXIN) 500 MG tablet Take 1 tablet (500 mg total) by mouth 2 (two) times daily. 02/07/19   Volanda Napoleon, PA-C  oxyCODONE-acetaminophen (PERCOCET/ROXICET) 5-325 MG tablet Take 1 tablet by mouth every 6 (six) hours as needed. Patient not taking: Reported on 06/05/2018 05/29/18   Constant, Peggy, MD  Prenatal Vit-Fe Fumarate-FA (PRENATAL MULTIVITAMIN) TABS tablet Take 1 tablet by mouth daily at 12 noon.    [provider]    Allergies    Codeine, Hydrocodone, and Percocet [oxycodone-acetaminophen]  Review of Systems   Review of Systems  Eyes: Positive for visual disturbance (intermittent blurry vision).  Cardiovascular: Positive for chest pain (intermittent).  Psychiatric/Behavioral: Positive for dysphoric mood, self-injury (previous) and suicidal ideas.  All other systems reviewed and are negative.   Physical Exam Updated Vital Signs BP (!) 184/127   Pulse 71   Temp 98.7 F (37.1 C) (Oral)   Resp 18   SpO2 100%   Physical Exam Vitals and nursing note reviewed.  Constitutional:      General: She is not in acute distress.    Appearance: She is well-developed. She is obese.     Comments: Obese female standing in the corner of the bathroom crying.  HENT:      Head: Normocephalic and atraumatic.  Eyes:     Extraocular Movements: Extraocular movements intact.     Conjunctiva/sclera: Conjunctivae normal.     Pupils: Pupils are equal, round, and reactive to light.  Cardiovascular:     Rate and Rhythm: Normal rate and regular rhythm.     Pulses: Normal pulses.  Pulmonary:     Effort: Pulmonary effort is normal. No respiratory distress.     Breath sounds: Normal breath sounds. No wheezing.     Comments: Clear lung sounds in  all fields Abdominal:     General: There is no distension.     Palpations: Abdomen is soft.     Tenderness: There is no abdominal tenderness.  Musculoskeletal:        General: Normal range of motion.     Cervical back: Normal range of motion and neck supple.  Skin:    General: Skin is warm and dry.     Capillary Refill: Capillary refill takes less than 2 seconds.  Neurological:     Mental Status: She is alert and oriented to person, place, and time.     GCS: GCS eye subscore is 4. GCS verbal subscore is 5. GCS motor subscore is 6.     Cranial Nerves: Cranial nerves are intact.     Sensory: Sensation is intact.     Motor: Motor function is intact.     Gait: Gait is intact. Gait normal.     Comments: CN intact.  Strength and sensation intact.  Grip strength equal bilaterally.  Ambulatory without ataxic gait  Psychiatric:        Mood and Affect: Mood is depressed. Affect is tearful.        Behavior: Behavior is withdrawn.        Thought Content: Thought content includes suicidal ideation.     Comments: Patient with a very tearful affect, crying throughout the visit.  She is standing in the corner of a darkened bathroom, withdrawn behavior.  Reporting suicidal thoughts, denying HI or AVH.      ED Results / Procedures / Treatments   Labs (all labs ordered are listed, but only abnormal results are displayed) Labs Reviewed  CBC WITH DIFFERENTIAL/PLATELET - Abnormal; Notable for the following components:      Result Value    WBC 11.2 (*)    All other components within normal limits  COMPREHENSIVE METABOLIC PANEL  SALICYLATE LEVEL  ACETAMINOPHEN LEVEL  ETHANOL  RAPID URINE DRUG SCREEN, HOSP PERFORMED  I-STAT BETA HCG BLOOD, ED ([facility], [facility], [facility] ONLY)  TROPONIN I (HIGH SENSITIVITY)    EKG None  Radiology No results found.  Procedures Procedures (including critical care time)  Medications Ordered in ED Medications  hydrochlorothiazide (MICROZIDE) capsule 25 mg (25 mg Oral Given 10/02/19 1727)  amLODipine (NORVASC) tablet 5 mg (5 mg Oral Given 10/02/19 1727)    ED Course  I have reviewed the triage vital signs and the nursing notes.  Pertinent labs & imaging results that were available during my care of the patient were reviewed by me and considered in my medical decision making (see chart for details).    MDM Rules/Calculators/A&P                      Patient presenting for evaluation of worsening depression.  On exam, patient appears very upset, crying throughout the visit.  She is standing in a darkened corner of the bathroom, withdrawn behavior.  She is reporting being stalked/followed from work and throughout [employer]. ?if pt being stalked vs if this is from her mental health.  Regarding her high blood pressure, she does not currently have any symptoms of endorgan damage.  Will obtain screening labs and EKG. Will give hctz and amlodipine (rx'd before) for tx prior to medical clearance.   EKG NSR. Labs interpreted by me, overall reassuring.  No sign of endorgan damage. BP improving. Pt medically cleared at this time.  Blood pressure meds ordered for discharge, social work discussed with patient and  patient can pick up for $4.  Additionally, blood pressure meds ordered for while patient is in the hospital.  Pending TTS evaluation.  TTS recommends inpatient treatment.  Informed by RN that patient is reporting chest pain.  I reevaluated the patient.  She states her pain began while talking to TTS.   She denies feeling more anxious.  Pain is on her left side, radiates to her back.  On reevaluation, this area is tender to palpation.  No obvious deformity.  She is speaking in full sentences.  Will repeat EKG.  As patient just had complete work-up including delta tropes which were negative, and has improving blood pressure, I have low suspicion for ACS.  Repeat EKG is not ischemic.  Offered Tylenol and ibuprofen, patient declined.  We will continue to monitor, but at this time patient remains medically cleared.  Behavioral health is aware and looking for bed availability.   Final Clinical Impression(s) / ED Diagnoses Final diagnoses:  Suicidal ideation  Depression, unspecified depression type  Hypertension, unspecified type    Rx / DC Orders ED Discharge Orders    None       Franchot Heidelberg, PA-C 10/02/19 2101    Wyvonnia Dusky, MD 10/03/19 1102

## 2019-10-02 NOTE — ED Triage Notes (Signed)
Pt reports that her BP high and needs to get it lowered before can go to [facility]. Not taking HTN medications.
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
## 2019-10-03 DIAGNOSIS — F332 Major depressive disorder, recurrent severe without psychotic features: Secondary | ICD-10-CM | POA: Diagnosis not present

## 2019-10-03 NOTE — Discharge Planning (Signed)
RNCM consulted in regards to medication assistance.  Pt has medication insurance coverage and is not eligible for Medication Assistance Through [facility]) program.  Will search for discount coupons and forward to pt when ready for discharge.

## 2019-10-03 NOTE — Progress Notes (Signed)
TOC CM spoke to pt and states she does have insurance through her job. Explained she can get her medications with her insurance coverage which range from $4-$0 as her copay. Will arrange appt with [facility] for follow up on HTN. [place], [place] ED TOC CM [phone]

## 2019-10-03 NOTE — ED Notes (Signed)
Pt DC off unit to home per provider. Pt calm, cooperative, no s/s of distress.  DC information given to and reviewed with pt, pt acknowledged understanding. Belongings given to pt. Pt ambulatory off unit, escorted by MHT. Pt has care.

## 2019-10-03 NOTE — Consult Note (Signed)
[facility] Psych ED Discharge  10/03/2019 9:36 AM MARYLOU WAGES  MRN:  [id number] Principal Problem: <principal problem not specified> Discharge Diagnoses: Active Problems:   * No active hospital problems. *   Subjective: "I just had a lot going on for the past few weeks. Being here just helped me to get some rest and realize I need to get to work this morning."  Patient assessed by nurse practitioner along with Dr. Dwyane Dee.  Patient alert and oriented, answers appropriately. Patient denies suicidal ideations at this time.  Patient denies history of suicide attempts, denies history of self-harm behaviors.  Patient denies auditory and visual hallucinations.  Patient denies symptoms of paranoia. Patient reports she resides in [place] with her 3 children.  Patient denies access to weapons.  Patient reports she is employed currently.  Patient denies alcohol and substance use. Patient reports she is not currently seen by outpatient psychiatry but would like to follow-up with outpatient psychiatry and talk therapy.  Patient offered support and encouragement.    Total Time spent with patient: 30 minutes  Past Psychiatric History: Marijuana use disorder, Suicidal thoughts  Past Medical History:  Past Medical History:  Diagnosis Date  . Hx of wisdom tooth extraction   . Hypertension   . Morbid obesity ([place])     Past Surgical History:  Procedure Laterality Date  . CESAREAN SECTION N/A 10/28/2013   Procedure: CESAREAN SECTION;  Surgeon: Emily Filbert, MD;  Location: [place] [facility];  Service: Obstetrics;  Laterality: N/A;  . CESAREAN SECTION N/A 09/15/2015   Procedure: CESAREAN SECTION;  Surgeon: Mora Bellman, MD;  Location: [place];  Service: Obstetrics;  Laterality: N/A;  . CESAREAN SECTION N/A 05/26/2018   Procedure: CESAREAN SECTION;  Surgeon: Woodroe Mode, MD;  Location: [place];  Service: Obstetrics;  Laterality: N/A;  . CHOLECYSTECTOMY    . WISDOM TOOTH EXTRACTION      Family History:  Family History  Problem Relation Age of Onset  . Hypertension Sister    Family Psychiatric  History: None reported Social History:  Social History   Substance and Sexual Activity  Alcohol Use Not Currently   Comment: occasional     Social History   Substance and Sexual Activity  Drug Use Yes  . Types: Marijuana    Social History   Socioeconomic History  . Marital status: Single    Spouse name: Not on file  . Number of children: Not on file  . Years of education: Not on file  . Highest education level: Not on file  Occupational History  . Not on file  Tobacco Use  . Smoking status: Current Every Day Smoker    Packs/day: 0.25    Types: Cigarettes  . Smokeless tobacco: Never Used  Substance and Sexual Activity  . Alcohol use: Not Currently    Comment: occasional  . Drug use: Yes    Types: Marijuana  . Sexual activity: Yes    Birth control/protection: None  Other Topics Concern  . Not on file  Social History Narrative  . Not on file   Social Determinants of Health   Financial Resource Strain:   . Difficulty of Paying Living Expenses:   Food Insecurity:   . Worried About Charity fundraiser in the Last Year:   . Arboriculturist in the Last Year:   Transportation Needs:   . Film/video editor (Medical):   Marland Kitchen Lack of Transportation (Non-Medical):   Physical Activity:   .  Days of Exercise per Week:   . Minutes of Exercise per Session:   Stress:   . Feeling of Stress :   Social Connections:   . Frequency of Communication with Friends and Family:   . Frequency of Social Gatherings with Friends and Family:   . Attends Religious Services:   . Active Member of Clubs or Organizations:   . Attends Archivist Meetings:   Marland Kitchen Marital Status:     Has this patient used any form of tobacco in the last 30 days? (Cigarettes, Smokeless Tobacco, Cigars, and/or Pipes) A prescription for an FDA-approved tobacco cessation medication was  offered at discharge and the patient refused  Current Medications: Current Facility-Administered Medications  Medication Dose Route Frequency Provider Last Rate Last Admin  . amLODipine (NORVASC) tablet 5 mg  5 mg Oral Daily Caccavale, Sophia, PA-C   5 mg at 10/03/19 0907  . hydrochlorothiazide (MICROZIDE) capsule 25 mg  25 mg Oral Daily Caccavale, Sophia, PA-C   25 mg at 10/03/19 7893   Current Outpatient Medications  Medication Sig Dispense Refill  . amLODipine (NORVASC) 5 MG tablet Take 1 tablet (5 mg total) by mouth daily. 30 tablet 0  . butalbital-acetaminophen-caffeine (FIORICET) 50-325-40 MG tablet Take 1-2 tablets by mouth every 6 (six) hours as needed for headache. (Patient not taking: Reported on 10/02/2019) 20 tablet 0  . citalopram (CELEXA) 20 MG tablet Take 1 tablet (20 mg total) by mouth daily. (Patient not taking: Reported on 10/02/2019) 30 tablet 12  . docusate sodium (COLACE) 100 MG capsule Take 1 capsule (100 mg total) by mouth 2 (two) times daily. (Patient not taking: Reported on 06/10/2018) 30 capsule 0  . ferrous sulfate 325 (65 FE) MG tablet Take 1 tablet (325 mg total) by mouth daily with breakfast. (Patient not taking: Reported on 10/02/2019) 30 tablet 3  . hydrochlorothiazide (HYDRODIURIL) 25 MG tablet Take 1 tablet (25 mg total) by mouth daily. 30 tablet 0  . ibuprofen (ADVIL,MOTRIN) 600 MG tablet Take 1 tablet (600 mg total) by mouth every 6 (six) hours as needed. (Patient not taking: Reported on 10/02/2019) 60 tablet 3  . ketoconazole (NIZORAL) 2 % cream Apply 1 application topically daily. (Patient not taking: Reported on 10/02/2019) 15 g 0  . lisinopril (ZESTRIL) 40 MG tablet Take 1 tablet (40 mg total) by mouth daily. (Patient not taking: Reported on 10/02/2019) 30 tablet 0  . methocarbamol (ROBAXIN) 500 MG tablet Take 1 tablet (500 mg total) by mouth 2 (two) times daily. (Patient not taking: Reported on 10/02/2019) 20 tablet 0  . oxyCODONE-acetaminophen (PERCOCET/ROXICET) 5-325  MG tablet Take 1 tablet by mouth every 6 (six) hours as needed. (Patient not taking: Reported on 06/05/2018) 20 tablet 0   PTA Medications: (Not in a hospital admission)   Musculoskeletal: Strength & Muscle Tone: within normal limits Gait & Station: normal Patient leans: N/A  Psychiatric Specialty Exam: Physical Exam  Review of Systems  Blood pressure (!) 143/100, pulse (!) 58, temperature 98.5 F (36.9 C), temperature source Oral, resp. rate (!) 22, SpO2 100 %, not currently breastfeeding.There is no height or weight on file to calculate BMI.  General Appearance: Casual and Fairly Groomed  Eye Contact:  Good  Speech:  Clear and Coherent and Normal Rate  Volume:  Normal  Mood:  Euthymic  Affect:  Appropriate and Congruent  Thought Process:  Coherent, Goal Directed and Descriptions of Associations: Intact  Orientation:  Full (Time, Place, and Person)  Thought Content:  Logical  Suicidal Thoughts:  No  Homicidal Thoughts:  No  Memory:  Immediate;   Fair Recent;   Fair Remote;   Fair  Judgement:  Fair  Insight:  Good  Psychomotor Activity:  Normal  Concentration:  Concentration: Good and Attention Span: Good  Recall:  Good  Fund of Knowledge:  Good  Language:  Good  Akathisia:  No  Handed:  Right  AIMS (if indicated):     Assets:  Communication Skills Desire for Improvement Financial Resources/Insurance Housing Intimacy Leisure Time Physical Health Resilience Social Support  ADL's:  Intact  Cognition:  WNL  Sleep:        Demographic Factors:  NA  Loss Factors: NA  Historical Factors: NA  Risk Reduction Factors:   Responsible for children under 59 years of age, Employed, Living with another person, especially a relative, Positive social support, Positive therapeutic relationship and Positive coping skills or problem solving skills  Continued Clinical Symptoms:    Cognitive Features That Contribute To Risk:  None    Suicide Risk:  Minimal: No  identifiable suicidal ideation.  Patients presenting with no risk factors but with morbid ruminations; may be classified as minimal risk based on the severity of the depressive symptoms    Plan Of Care/Follow-up recommendations:  Other:  Follow-up with outpatient psychiatric resources, provided.  Disposition: Discharge Emmaline Kluver, FNP 10/03/2019, 9:36 AM

## 2019-10-03 NOTE — Discharge Instructions (Signed)
For your behavioral health needs, you are advised to follow up with an outpatient provider.  Contact one of the providers listed below at your earliest opportunity to schedule an intake appointment:       [facility]. [employer] [address] [place], [place] [address]      [phone]       [address] [place], [place] [address]      [phone]       [place]      [place] [address] [place], [place] [address]      [phone]

## 2019-10-03 NOTE — Progress Notes (Addendum)
10/03/2019 500 pm Appt scheduled for [facility] for 10/14/2019 at 10:50 am. ED provider updated. Contacted pharmacy and meds were $0 waiting for pt to pick up at pharmacy. [place], [place] ED TOC CM [phone]

## 2019-10-03 NOTE — BH Assessment (Signed)
[place] Assessment Progress Note   Per Letitia Libra, FNP, this pt does not require psychiatric hospitalization at this time.  Pt is to be discharged from [facility] with referrals for area outpatient provider.  Per Eustaquio Maize, pt's nurse, pt is eager to be discharged so that she can go to work and does not want to wait for an intake appointment to be scheduled.  Discharge instructions include referrals for several area providers.  Eustaquio Maize has been notified.  Jalene Mullet, [place] Triage Specialist [phone]

## 2019-10-03 NOTE — BHH Counselor (Signed)
Per Dr. Dayna Barker recommends pt to be reassessed by psychiatry in this morning. Discussed with Mechele Claude, RN. Per EDP, pt would not answer questions related to SI and pt kept saying, "I got to go to work, I can't stay here."   Per [name], NT, pt's cousin is watching her kids and has to be at work at 0800. After 0800, her children will be alone. Per NT, pt has to be at work at 87, got a text saying if she did not come to work she would be fired. Pt is very concerned.   Vertell Novak, [place], [facility], [facility] Triage Specialist [phone]

## 2019-10-14 ENCOUNTER — Other Ambulatory Visit: Payer: Self-pay

## 2019-10-14 ENCOUNTER — Encounter (INDEPENDENT_AMBULATORY_CARE_PROVIDER_SITE_OTHER): Payer: Self-pay | Admitting: Primary Care

## 2019-10-14 ENCOUNTER — Ambulatory Visit (INDEPENDENT_AMBULATORY_CARE_PROVIDER_SITE_OTHER): Payer: BC Managed Care – PPO | Admitting: Licensed Clinical Social Worker

## 2019-10-14 ENCOUNTER — Ambulatory Visit (INDEPENDENT_AMBULATORY_CARE_PROVIDER_SITE_OTHER): Payer: BC Managed Care – PPO | Admitting: Primary Care

## 2019-10-14 VITALS — BP 176/112 | HR 64 | Temp 97.3°F | Ht 71.0 in | Wt 384.4 lb

## 2019-10-14 DIAGNOSIS — F332 Major depressive disorder, recurrent severe without psychotic features: Secondary | ICD-10-CM | POA: Diagnosis not present

## 2019-10-14 DIAGNOSIS — Z6841 Body Mass Index (BMI) 40.0 and over, adult: Secondary | ICD-10-CM | POA: Diagnosis not present

## 2019-10-14 DIAGNOSIS — I10 Essential (primary) hypertension: Secondary | ICD-10-CM

## 2019-10-14 DIAGNOSIS — Z09 Encounter for follow-up examination after completed treatment for conditions other than malignant neoplasm: Secondary | ICD-10-CM | POA: Diagnosis not present

## 2019-10-14 DIAGNOSIS — Z7689 Persons encountering health services in other specified circumstances: Secondary | ICD-10-CM | POA: Diagnosis not present

## 2019-10-14 MED ORDER — HYDROCHLOROTHIAZIDE 25 MG PO TABS: 25.0000 mg | ORAL_TABLET | Freq: Every day | ORAL | 1 refills | Status: DC

## 2019-10-14 MED ORDER — AMLODIPINE BESYLATE 10 MG PO TABS
10.0000 mg | ORAL_TABLET | Freq: Every day | ORAL | 3 refills | Status: DC
Start: 2019-10-14 — End: 2020-08-03

## 2019-10-14 MED ORDER — CLONIDINE HCL 0.2 MG PO TABS: 0.1000 mg | ORAL_TABLET | Freq: Every day | ORAL | 1 refills | Status: DC

## 2019-10-14 NOTE — Patient Instructions (Signed)

## 2019-10-14 NOTE — Progress Notes (Signed)
New Patient Office Visit  Subjective:  Patient ID: Tabitha Holland, female    DOB: February 28, 1986  Age: 34 y.o. MRN: [id number]  CC:  Chief Complaint  Patient presents with  . Hospitalization Follow-up    SI/HTN    HPI [name] is a 34 year old female who  presents for establishment of care, hypertension and hospital follow up. Denies shortness of breath, headaches and chest pain. She does have  lower extremity edema   Past Medical History:  Diagnosis Date  . Hx of wisdom tooth extraction   . Hypertension   . Morbid obesity ([place])     Past Surgical History:  Procedure Laterality Date  . CESAREAN SECTION N/A 10/28/2013   Procedure: CESAREAN SECTION;  Surgeon: Emily Filbert, MD;  Location: [facility];  Service: Obstetrics;  Laterality: N/A;  . CESAREAN SECTION N/A 09/15/2015   Procedure: CESAREAN SECTION;  Surgeon: Mora Bellman, MD;  Location: [place];  Service: Obstetrics;  Laterality: N/A;  . CESAREAN SECTION N/A 05/26/2018   Procedure: CESAREAN SECTION;  Surgeon: Woodroe Mode, MD;  Location: [place] [facility];  Service: Obstetrics;  Laterality: N/A;  . CHOLECYSTECTOMY    . WISDOM TOOTH EXTRACTION      Family History  Problem Relation Age of Onset  . Hypertension Sister     Social History   Socioeconomic History  . Marital status: Single    Spouse name: Not on file  . Number of children: Not on file  . Years of education: Not on file  . Highest education level: Not on file  Occupational History  . Not on file  Tobacco Use  . Smoking status: Current Every Day Smoker    Packs/day: 0.25    Types: Cigarettes  . Smokeless tobacco: Never Used  Vaping Use  . Vaping Use: Never used  Substance and Sexual Activity  . Alcohol use: Not Currently    Comment: occasional  . Drug use: Yes    Types: Marijuana  . Sexual activity: Yes    Birth control/protection: None  Other Topics Concern  . Not on file  Social History Narrative  . Not on file    Social Determinants of Health   Financial Resource Strain:   . Difficulty of Paying Living Expenses:   Food Insecurity:   . Worried About Charity fundraiser in the Last Year:   . Arboriculturist in the Last Year:   Transportation Needs:   . Film/video editor (Medical):   Marland Kitchen Lack of Transportation (Non-Medical):   Physical Activity:   . Days of Exercise per Week:   . Minutes of Exercise per Session:   Stress:   . Feeling of Stress :   Social Connections:   . Frequency of Communication with Friends and Family:   . Frequency of Social Gatherings with Friends and Family:   . Attends Religious Services:   . Active Member of Clubs or Organizations:   . Attends Archivist Meetings:   Marland Kitchen Marital Status:   Intimate Partner Violence:   . Fear of Current or Ex-Partner:   . Emotionally Abused:   Marland Kitchen Physically Abused:   . Sexually Abused:     ROS Review of Systems  Psychiatric/Behavioral: Positive for agitation, behavioral problems and suicidal ideas.       Insomnia   All other systems reviewed and are negative.   Objective:   Today's Vitals: BP (!) 176/112 (BP Location: Right Arm, Patient  Position: Sitting) Comment (Cuff Size): thigh  Pulse 64   Temp (!) 97.3 F (36.3 C) (Temporal)   Ht 5\' 11"  (1.803 m)   Wt (!) 384 lb 6.4 oz (174.4 kg)   SpO2 100%   Breastfeeding No   BMI 53.61 kg/m   Physical Exam  General: Vital signs reviewed.  Patient is well-developed and well-nourished,morbid obesity  in no acute distress and cooperative with exam.  Head: Normocephalic and atraumatic. Eyes: EOMI, conjunctivae normal, no scleral icterus.  Neck: Supple, trachea midline, normal ROM, no JVD, masses, thyromegaly, or carotid bruit present.  Cardiovascular: RRR, S1 normal, S2 normal, no murmurs, gallops, or rubs. Pulmonary/Chest: Clear to auscultation bilaterally, no wheezes, rales, or rhonchi. Abdominal: Soft, non-tender, non-distended, BS +, no masses, organomegaly, or  guarding present.  Musculoskeletal: No joint deformities, erythema, or stiffness, ROM full and nontender. Extremities: No lower extremity edema bilaterally,  pulses symmetric and intact bilaterally. No cyanosis or clubbing. Neurological: A&O x3, Strength is normal and symmetric bilaterally, cranial nerve II-XII are grossly intact, no focal motor deficit, sensory intact to light touch bilaterally.  Skin: Warm, dry and intact. No rashes or erythema. Psychiatric: Normal mood and affect. speech and behavior is normal. Cognition and memory are normal.   Assessment & Plan:  Tabitha Holland was seen today for hospitalization follow-up.  Diagnoses and all orders for this visit:  Encounter to establish care Juluis Mire, NP-C will be your  (PCP) she is mastered prepared . Able to diagnosed and treatment also  answer health concern as well as continuing care of varied medical conditions, not limited by cause, organ system, or diagnosis.   BMI 45.0-49.9, adult (HCC) Morbid Obesity is > 40 BMI indicating an excess in caloric intake or underlining conditions. This may lead to other co-morbidities. Lifestyle modifications of diet and exercise may reduce obesity.   Severe episode of recurrent major depressive disorder, without psychotic features ([place]) No recent suicidal ideations. She did feel like she had nothing to live for . Asked what about her children stated sometimes I feel like they are better off without me.  Asked her to see CSW today at this visit  Hospital discharge follow-up Patient states she has been very depressed for the past several months. She states she tried to cut her wrists in January, but it hurt too much so she stopped.  Essential hypertension Counseled on blood pressure goal of less than 130/80, low-sodium, DASH diet, medication compliance, 150 minutes of moderate intensity exercise per week. Discussed medication changes increased amlodipine 10mg  and continue HCTZ 25mg  daily added  clonidine .2mg  at bedtime this can be twofold treatment HTN and insomnia   Other orders -     amLODipine (NORVASC) 10 MG tablet; Take 1 tablet (10 mg total) by mouth daily. -     hydrochlorothiazide (HYDRODIURIL) 25 MG tablet; Take 1 tablet (25 mg total) by mouth daily. -     cloNIDine (CATAPRES) 0.2 MG tablet; Take 0.5 tablets (0.1 mg total) by mouth daily.    Outpatient Encounter Medications as of 10/14/2019  Medication Sig  . hydrochlorothiazide (HYDRODIURIL) 25 MG tablet Take 1 tablet (25 mg total) by mouth daily.  . [DISCONTINUED] amLODipine (NORVASC) 5 MG tablet Take 1 tablet (5 mg total) by mouth daily.  . [DISCONTINUED] hydrochlorothiazide (HYDRODIURIL) 25 MG tablet Take 1 tablet (25 mg total) by mouth daily.  Marland Kitchen amLODipine (NORVASC) 10 MG tablet Take 1 tablet (10 mg total) by mouth daily.  . cloNIDine (CATAPRES) 0.2 MG tablet Take  0.5 tablets (0.1 mg total) by mouth daily.  . [DISCONTINUED] citalopram (CELEXA) 20 MG tablet Take 1 tablet (20 mg total) by mouth daily. (Patient not taking: Reported on 10/02/2019)  . [DISCONTINUED] docusate sodium (COLACE) 100 MG capsule Take 1 capsule (100 mg total) by mouth 2 (two) times daily. (Patient not taking: Reported on 06/10/2018)  . [DISCONTINUED] ferrous sulfate 325 (65 FE) MG tablet Take 1 tablet (325 mg total) by mouth daily with breakfast. (Patient not taking: Reported on 10/02/2019)  . [DISCONTINUED] lisinopril (ZESTRIL) 40 MG tablet Take 1 tablet (40 mg total) by mouth daily. (Patient not taking: Reported on 10/02/2019)  . [DISCONTINUED] methocarbamol (ROBAXIN) 500 MG tablet Take 1 tablet (500 mg total) by mouth 2 (two) times daily. (Patient not taking: Reported on 10/02/2019)   No facility-administered encounter medications on file as of 10/14/2019.    Follow-up: Return in about 3 weeks (around 11/04/2019) for Bp follow and medication compliance.   Kerin Perna, NP

## 2019-10-29 NOTE — BH Specialist Note (Signed)
Integrated Behavioral Health Initial Visit  MRN: [id number] Name: Tabitha Holland  Number of [facility] Clinician visits:: 1/6 Session Start time: 11:30 AM  Session End time: 12:15PM Total time: 45   Type of Service: [place] Interpretor:No. Interpretor Name and Language: NA   Warm Hand Off Completed.       SUBJECTIVE: Tabitha Holland is a 34 y.o. female accompanied by self Patient was referred by NP Oletta Lamas for depression. Patient reports the following symptoms/concerns: Pt reports difficulty managing depression and anxiety Duration of problem: Ongoing; "Since I was a child"; Severity of problem: severe  OBJECTIVE: Mood: Depressed and Affect: Tearful Risk of harm to self or others: No plan to harm self or others Pt reports hx of suicidal ideations; however, denies current intent or plan. Protective factors identified, safety plan discussed, and crisis intervention resources provided.  LIFE CONTEXT: Family and Social: Pt receives very limited support.  School/Work: Pt is employed; however, reports work stress Self-Care:  Life Changes: Pt has ongoing psychosocial stressors negatively impacting physical and mental health  GOALS ADDRESSED: Patient will: 1. Reduce symptoms of: anxiety, depression and stress 2. Increase knowledge and/or ability of: coping skills and healthy habits  3. Demonstrate ability to: Increase healthy adjustment to current life circumstances and Increase adequate support systems for patient/family  INTERVENTIONS: Interventions utilized: Solution-Focused Strategies, Supportive Counseling, Psychoeducation and/or Health Education and Link to [employer]  Standardized Assessments completed: GAD-7 and PHQ 2&9 with C-SSRS  ASSESSMENT: Patient currently experiencing difficulty managing depression and anxiety symptoms triggered by ongoing psychosocial stressors negatively impacting physical and  mental health.  Pt reports hx of suicidal ideations; however, denies current intent or plan. Protective factors identified, safety plan discussed, and crisis intervention resources provided.   Patient may benefit from medication management and therapy. LCSW provided validation and support. Therapeutic strategies discussed to assist with decrease and/or management of symptoms. Pt is not certain that she is interested in initiating services. Supportive resources were discussed.  PLAN: 1. Follow up with behavioral health clinician on : Contact LCSW with behavioral health and/or resource needs 2. Behavioral recommendations: Utilize strategies and resources discussed 3. Referral(s): [facility] (In Clinic) 4. "From scale of 1-10, how likely are you to follow plan?":   Rebekah Chesterfield, LCSW 10/29/2019 10:53 AM

## 2020-01-09 ENCOUNTER — Ambulatory Visit (HOSPITAL_COMMUNITY)
Admission: EM | Admit: 2020-01-09 | Discharge: 2020-01-09 | Disposition: A | Discharge status: Home / Self Care | Payer: Medicaid Other

## 2020-01-09 ENCOUNTER — Encounter (HOSPITAL_COMMUNITY): Payer: Self-pay

## 2020-01-09 ENCOUNTER — Other Ambulatory Visit: Payer: Self-pay

## 2020-01-09 DIAGNOSIS — R220 Localized swelling, mass and lump, head: Secondary | ICD-10-CM | POA: Diagnosis not present

## 2020-01-09 NOTE — Discharge Instructions (Signed)
Please go to the ER for worse symptoms.
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
## 2020-08-02 ENCOUNTER — Encounter (INDEPENDENT_AMBULATORY_CARE_PROVIDER_SITE_OTHER): Payer: Medicaid Other | Admitting: Primary Care

## 2020-08-03 ENCOUNTER — Encounter (INDEPENDENT_AMBULATORY_CARE_PROVIDER_SITE_OTHER): Payer: Self-pay | Admitting: Primary Care

## 2020-08-03 ENCOUNTER — Telehealth (INDEPENDENT_AMBULATORY_CARE_PROVIDER_SITE_OTHER): Payer: Self-pay | Admitting: Primary Care

## 2020-08-03 ENCOUNTER — Ambulatory Visit (INDEPENDENT_AMBULATORY_CARE_PROVIDER_SITE_OTHER): Payer: BC Managed Care – PPO | Admitting: Primary Care

## 2020-08-03 ENCOUNTER — Other Ambulatory Visit: Payer: Self-pay

## 2020-08-03 VITALS — BP 153/97 | HR 76 | Temp 97.7°F | Ht 71.0 in | Wt 339.8 lb

## 2020-08-03 DIAGNOSIS — Z0000 Encounter for general adult medical examination without abnormal findings: Secondary | ICD-10-CM

## 2020-08-03 DIAGNOSIS — M542 Cervicalgia: Secondary | ICD-10-CM

## 2020-08-03 DIAGNOSIS — Z131 Encounter for screening for diabetes mellitus: Secondary | ICD-10-CM | POA: Diagnosis not present

## 2020-08-03 DIAGNOSIS — L308 Other specified dermatitis: Secondary | ICD-10-CM

## 2020-08-03 DIAGNOSIS — Z6841 Body Mass Index (BMI) 40.0 and over, adult: Secondary | ICD-10-CM | POA: Diagnosis not present

## 2020-08-03 DIAGNOSIS — I10 Essential (primary) hypertension: Secondary | ICD-10-CM | POA: Diagnosis not present

## 2020-08-03 DIAGNOSIS — Z760 Encounter for issue of repeat prescription: Secondary | ICD-10-CM

## 2020-08-03 LAB — POCT GLYCOSYLATED HEMOGLOBIN (HGB A1C): Hemoglobin A1C: 5.2 % (ref 4.0–5.6)

## 2020-08-03 MED ORDER — HYDROCHLOROTHIAZIDE 25 MG PO TABS: 25.0000 mg | ORAL_TABLET | Freq: Every day | ORAL | 1 refills | Status: DC

## 2020-08-03 MED ORDER — AMLODIPINE BESYLATE 10 MG PO TABS: 10.0000 mg | ORAL_TABLET | Freq: Every day | ORAL | 3 refills | Status: DC

## 2020-08-03 MED ORDER — CELECOXIB 100 MG PO CAPS: 100.0000 mg | ORAL_CAPSULE | Freq: Two times a day (BID) | ORAL | 3 refills | Status: DC

## 2020-08-03 MED ORDER — CLONIDINE HCL 0.2 MG PO TABS: 0.1000 mg | ORAL_TABLET | Freq: Every day | ORAL | 1 refills | Status: DC

## 2020-08-03 NOTE — Progress Notes (Signed)
[name]  Annual Visit   [name] is a 35 y.o. female presents to office today for annual physical exam examination.    Concerns today include: 1. HTN  2. Mediation refill 3. Right trapezius  Occupation: Camera operator  , Marital status: S, Substance use: S  Diet: watch carb , Exercise: walking  Last eye exam:unknown  Last dental exam: unknown  Last pap smear: 05/12/13 Due Refills needed today: yes  Immunizations needed: Flu Vaccine: yes  Tdap Vaccine: yes  - every 51yrs - (<3 lifetime doses or unknown): all wounds -- look up need for Tetanus IG - (>=3 lifetime doses): clean/minor wound if >10yrs from previous; all other wounds if >76yrs from previous Zoster Vaccine: no (those >50yo, once) Pneumonia Vaccine: no (those w/ risk factors) - (<21yr) Both: Immunocompromised, cochlear implant, CSF leak, asplenic, sickle cell, Chronic Renal Failure - (<23yr) PPSV-23 only: Heart dz, lung disease, DM, tobacco abuse, alcoholism, cirrhosis/liver disease. - (>65yr): PPSV13 then PPSV23 in 6-12mths;  - (>66yr): repeat PPSV23 once if pt received prior to 34yo and 37yrs have passed  Past Medical History:  Diagnosis Date  . Hx of wisdom tooth extraction   . Hypertension   . Morbid obesity ([place])    Social History   Socioeconomic History  . Marital status: Single    Spouse name: Not on file  . Number of children: Not on file  . Years of education: Not on file  . Highest education level: Not on file  Occupational History  . Not on file  Tobacco Use  . Smoking status: Current Every Day Smoker    Packs/day: 0.25    Types: Cigarettes  . Smokeless tobacco: Never Used  Vaping Use  . Vaping Use: Never used  Substance and Sexual Activity  . Alcohol use: Not Currently    Comment: occasional  . Drug use: Yes    Types: Marijuana  . Sexual activity: Yes    Birth control/protection: None  Other Topics Concern  . Not on file  Social History Narrative  . Not on  file   Social Determinants of Health   Financial Resource Strain: Not on file  Food Insecurity: Not on file  Transportation Needs: Not on file  Physical Activity: Not on file  Stress: Not on file  Social Connections: Not on file  Intimate Partner Violence: Not on file   Past Surgical History:  Procedure Laterality Date  . CESAREAN SECTION N/A 10/28/2013   Procedure: CESAREAN SECTION;  Surgeon: Emily Filbert, MD;  Location: [facility];  Service: Obstetrics;  Laterality: N/A;  . CESAREAN SECTION N/A 09/15/2015   Procedure: CESAREAN SECTION;  Surgeon: Mora Bellman, MD;  Location: [facility];  Service: Obstetrics;  Laterality: N/A;  . CESAREAN SECTION N/A 05/26/2018   Procedure: CESAREAN SECTION;  Surgeon: Woodroe Mode, MD;  Location: [place];  Service: Obstetrics;  Laterality: N/A;  . CHOLECYSTECTOMY    . WISDOM TOOTH EXTRACTION     Family History  Problem Relation Age of Onset  . Hypertension Sister     Current Outpatient Medications:  .  amLODipine (NORVASC) 10 MG tablet, Take 1 tablet (10 mg total) by mouth daily., Disp: 90 tablet, Rfl: 3 .  cloNIDine (CATAPRES) 0.2 MG tablet, Take 0.5 tablets (0.1 mg total) by mouth daily., Disp: 90 tablet, Rfl: 1 .  hydrochlorothiazide (HYDRODIURIL) 25 MG tablet, Take 1 tablet (25 mg total) by mouth daily., Disp: 90 tablet, Rfl: 1  Allergies  Allergen Reactions  . Codeine Other (See Comments)    migraines  . Hydrocodone Other (See Comments)    migraines  . Percocet [Oxycodone-Acetaminophen] Other (See Comments)    migraines     ROS: Review of Systems Pertinent items noted in HPI and remainder of comprehensive ROS otherwise negative.    Physical exam BP (!) 153/97 (BP Location: Right Wrist, Patient Position: Sitting, Cuff Size: Normal)   Pulse 76   Temp 97.7 F (36.5 C) (Temporal)   Ht $R'5\' 11"'ld$  (1.803 m)   Wt (!) 339 lb 12.8 oz (154.1 kg)   SpO2 97%   BMI 47.39 kg/m   Vitals:   08/03/20 0913  BP: (!) 153/97   Pulse: 76  Temp: 97.7 F (36.5 C)  TempSrc: Temporal  SpO2: 97%  Weight: (!) 339 lb 12.8 oz (154.1 kg)  Height: $Remove'5\' 11"'WKusWhV$  (1.803 m)   General: Vital signs reviewed.  Bp elevated out of medication for several months but has been watching her sodium in take and exercising. Patient is well-developed and well-nourished, morbid obese female  in no acute distress and cooperative with exam.  Head: Normocephalic and atraumatic. Eyes: EOMI, conjunctivae normal, no scleral icterus.  Neck: Supple, trachea midline, normal ROM, no JVD, masses, thyromegaly, or carotid bruit present.  Cardiovascular: RRR, S1 normal, S2 normal, no murmurs, gallops, or rubs. Pulmonary/Chest: Clear to auscultation bilaterally, no wheezes, rales, or rhonchi. Abdominal: Soft, non-tender, non-distended, BS +, no masses, organomegaly, or guarding present.  Musculoskeletal: No joint deformities, erythema, or stiffness, ROM full and nontender. Extremities: No lower extremity edema bilaterally,  pulses symmetric and intact bilaterally. No cyanosis or clubbing. Neurological: A&O x3, Strength is normal and symmetric bilaterally,  sensory intact to light touch bilaterally.  Skin: Warm, dry and intact. Psoriasiform dermatitis palms of hands Psychiatric: Normal mood and affect. speech and behavior is normal. Cognition and memory are normal.   Assessment/ Plan: [name] here for annual physical exam.  [name] was seen today for annual exam and medication refill.  Diagnoses and all orders for this visit:  Annual physical exam Completed   BMI 45.0-49.9, adult ([place]) Morbid Obesity is> 40 BMI  indicating an excess in caloric intake or underlining conditions. This may lead to other co-morbidities. Lifestyle modifications of diet and exercise may reduce obesity.  Risk for T2D, respiratory complications, arthralgia  -     Lipid Panel -     TSH + free T4  Essential hypertension Counseled on blood pressure goal of less than  130/80, low-sodium, DASH diet, medication compliance, 150 minutes of moderate intensity exercise per week. Discussed medication compliance, adverse effects. -     hydrochlorothiazide (HYDRODIURIL) 25 MG tablet; Take 1 tablet (25 mg total) by mouth daily. -     amLODipine (NORVASC) 10 MG tablet; Take 1 tablet (10 mg total) by mouth daily. -     cloNIDine (CATAPRES) 0.2 MG tablet; Take 0.5 tablets (0.1 mg total) by mouth daily. -     CBC with Differential -     CMP14+EGFR  Screening for diabetes mellitus -     HgB A1c 5.2 per ADA guidelines she is not a diabetic   Medication refill -     hydrochlorothiazide (HYDRODIURIL) 25 MG tablet; Take 1 tablet (25 mg total) by mouth daily. -     amLODipine (NORVASC) 10 MG tablet; Take 1 tablet (10 mg total) by mouth daily. -     cloNIDine (CATAPRES) 0.2 MG tablet; Take 0.5 tablets (0.1 mg  total) by mouth daily.  Cervical pain -     celecoxib (CELEBREX) 100 MG capsule; Take 1 capsule (100 mg total) by mouth 2 (two) times daily.  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   Counseled on healthy lifestyle choices, including diet (rich in fruits, vegetables and lean meats and low in salt and simple carbohydrates) and exercise (at least 30 minutes of moderate physical activity daily).  Patient to follow up in 1 year for annual exam or sooner if needed.  The above assessment and management plan was discussed with the patient. The patient verbalized understanding of and has agreed to the management plan. Patient is aware to call the clinic if symptoms persist or worsen. Patient is aware when to return to the clinic for a follow-up visit. Patient educated on when it is appropriate to go to the emergency department.   Juluis Mire NP-C [address] [place]  [place] [phone]

## 2020-08-03 NOTE — Patient Instructions (Signed)

## 2020-08-03 NOTE — Telephone Encounter (Signed)
[facility] calling stating pt has called to make an appt for her referral request at [facility] this am. They have not received and pt called for appt and pt has told them to reach out as it was to be faxed over? [facility] needs by early am tomorrow fax to [phone].

## 2020-08-04 DIAGNOSIS — L2084 Intrinsic (allergic) eczema: Secondary | ICD-10-CM | POA: Diagnosis not present

## 2020-08-04 LAB — CMP14+EGFR
ALT: 11 IU/L (ref 0–32)
AST: 12 IU/L (ref 0–40)
Albumin/Globulin Ratio: 1.3 (ref 1.2–2.2)
Albumin: 3.7 g/dL — ABNORMAL LOW (ref 3.8–4.8)
Alkaline Phosphatase: 81 IU/L (ref 44–121)
BUN/Creatinine Ratio: 12 (ref 9–23)
BUN: 8 mg/dL (ref 6–20)
Bilirubin Total: 0.6 mg/dL (ref 0.0–1.2)
CO2: 23 mmol/L (ref 20–29)
Calcium: 8.9 mg/dL (ref 8.7–10.2)
Chloride: 105 mmol/L (ref 96–106)
Creatinine, Ser: 0.67 mg/dL (ref 0.57–1.00)
Globulin, Total: 2.9 g/dL (ref 1.5–4.5)
Glucose: 85 mg/dL (ref 65–99)
Potassium: 4.4 mmol/L (ref 3.5–5.2)
Sodium: 139 mmol/L (ref 134–144)
Total Protein: 6.6 g/dL (ref 6.0–8.5)
eGFR: 118 mL/min/{1.73_m2} (ref >59)

## 2020-08-04 LAB — CBC WITH DIFFERENTIAL/PLATELET
Basophils Absolute: 0 10*3/uL (ref 0.0–0.2)
Basos: 0 %
EOS (ABSOLUTE): 0.1 10*3/uL (ref 0.0–0.4)
Eos: 1 %
Hematocrit: 35.9 % (ref 34.0–46.6)
Hemoglobin: 12 g/dL (ref 11.1–15.9)
Immature Grans (Abs): 0 10*3/uL (ref 0.0–0.1)
Immature Granulocytes: 0 %
Lymphocytes Absolute: 1.8 10*3/uL (ref 0.7–3.1)
Lymphs: 23 %
MCH: 29.3 pg (ref 26.6–33.0)
MCHC: 33.4 g/dL (ref 31.5–35.7)
MCV: 88 fL (ref 79–97)
Monocytes Absolute: 0.4 10*3/uL (ref 0.1–0.9)
Monocytes: 6 %
Neutrophils Absolute: 5.5 10*3/uL (ref 1.4–7.0)
Neutrophils: 70 %
Platelets: 181 10*3/uL (ref 150–450)
RBC: 4.1 x10E6/uL (ref 3.77–5.28)
RDW: 13.3 % (ref 11.7–15.4)
WBC: 7.9 10*3/uL (ref 3.4–10.8)

## 2020-08-04 LAB — LIPID PANEL
Chol/HDL Ratio: 2.5 ratio (ref 0.0–4.4)
Cholesterol, Total: 139 mg/dL (ref 100–199)
HDL: 55 mg/dL (ref >39)
LDL Chol Calc (NIH): 72 mg/dL (ref 0–99)
Triglycerides: 58 mg/dL (ref 0–149)
VLDL Cholesterol Cal: 12 mg/dL (ref 5–40)

## 2020-08-04 LAB — TSH+FREE T4
Free T4: 1.19 ng/dL (ref 0.82–1.77)
TSH: 1.42 u[IU]/mL (ref 0.450–4.500)

## 2020-08-04 NOTE — Telephone Encounter (Signed)
I have faxed [facility] the referral request for the patient.

## 2020-08-06 ENCOUNTER — Telehealth (INDEPENDENT_AMBULATORY_CARE_PROVIDER_SITE_OTHER): Payer: Self-pay

## 2020-08-06 NOTE — Telephone Encounter (Signed)
-----   Message from Kerin Perna, NP sent at 08/06/2020  4:27 PM EDT ----- Labs are  normal. Make sure you are drinking at least 48 oz of water per day. Work on eating a low fat, heart healthy diet and participate in regular aerobic exercise program to control as well. Exercise at least  30 minutes per day-5 days per week. Avoid red meat. No fried foods. No junk foods, sodas, sugary foods or drinks, unhealthy snacking, alcohol or smoking.

## 2020-08-06 NOTE — Telephone Encounter (Signed)
Patient is aware of normal labs. Advised on water intake as well as exercise and diet. Nat Christen, CMA

## 2020-09-21 ENCOUNTER — Ambulatory Visit (INDEPENDENT_AMBULATORY_CARE_PROVIDER_SITE_OTHER): Payer: BC Managed Care – PPO | Admitting: Family Medicine

## 2020-10-15 ENCOUNTER — Ambulatory Visit (INDEPENDENT_AMBULATORY_CARE_PROVIDER_SITE_OTHER): Payer: BC Managed Care – PPO | Admitting: Obstetrics & Gynecology

## 2020-10-15 ENCOUNTER — Encounter: Payer: Self-pay | Admitting: Obstetrics & Gynecology

## 2020-10-15 ENCOUNTER — Other Ambulatory Visit: Payer: Self-pay

## 2020-10-15 ENCOUNTER — Encounter: Payer: Self-pay | Admitting: *Deleted

## 2020-10-15 VITALS — BP 161/102 | HR 60 | Ht 70.5 in | Wt 333.6 lb

## 2020-10-15 DIAGNOSIS — Z3046 Encounter for surveillance of implantable subdermal contraceptive: Secondary | ICD-10-CM | POA: Diagnosis not present

## 2020-10-15 NOTE — Progress Notes (Signed)
GYNECOLOGY OFFICE PROCEDURE NOTE  Tabitha Holland is a 35 y.o. [id number] here for Nexplanon removal.  Last pap smear was on 2015 and was normal.  No other gynecologic concerns.    Nexplanon Removal Patient identified, informed consent performed, consent signed.   Appropriate time out taken. Nexplanon site identified.  Area prepped in usual sterile fashon. One ml of 1% lidocaine was used to anesthetize the area at the distal end of the implant. A small stab incision was made right beside the implant on the distal portion.  The Nexplanon rod was removed without difficulty.  There was minimal blood loss. There were no complications.    Steri-strips were applied over the small incision.  A pressure bandage was applied to reduce any bruising.  The patient tolerated the procedure well and was given post procedure instructions.  Patient is planning to use no contraception as she has no female partner now.       Woodroe Mode, MD Attending Obstetrician & Gynecologist, [place] for [place]  10/15/2020

## 2020-10-15 NOTE — Progress Notes (Signed)
Here to get nexplanon removed, states does not need any birth control. Elevated phq9 and gad 7 noted, offered [facility]. Declines,states want to see if removing birth control alleviates those feelings.  BP elevated, states is taking all her bp meds but not yet today.  Maisey Deandrade,RN

## 2020-11-11 DIAGNOSIS — I10 Essential (primary) hypertension: Secondary | ICD-10-CM | POA: Diagnosis not present

## 2020-11-11 DIAGNOSIS — I119 Hypertensive heart disease without heart failure: Secondary | ICD-10-CM | POA: Diagnosis not present

## 2020-11-11 DIAGNOSIS — R079 Chest pain, unspecified: Secondary | ICD-10-CM | POA: Diagnosis not present

## 2020-11-11 DIAGNOSIS — R0789 Other chest pain: Secondary | ICD-10-CM | POA: Diagnosis not present

## 2020-11-11 DIAGNOSIS — R001 Bradycardia, unspecified: Secondary | ICD-10-CM | POA: Diagnosis not present

## 2020-11-11 DIAGNOSIS — R519 Headache, unspecified: Secondary | ICD-10-CM | POA: Diagnosis not present

## 2020-11-11 DIAGNOSIS — I498 Other specified cardiac arrhythmias: Secondary | ICD-10-CM | POA: Diagnosis not present

## 2020-11-11 DIAGNOSIS — E236 Other disorders of pituitary gland: Secondary | ICD-10-CM | POA: Diagnosis not present

## 2020-11-22 ENCOUNTER — Other Ambulatory Visit (HOSPITAL_COMMUNITY)
Admission: RE | Admit: 2020-11-22 | Discharge: 2020-11-22 | Disposition: A | Discharge status: Home / Self Care | Payer: BC Managed Care – PPO | Source: Ambulatory Visit | Attending: Family Medicine | Admitting: Family Medicine

## 2020-11-22 ENCOUNTER — Other Ambulatory Visit: Payer: Self-pay

## 2020-11-22 ENCOUNTER — Ambulatory Visit (INDEPENDENT_AMBULATORY_CARE_PROVIDER_SITE_OTHER): Payer: BC Managed Care – PPO | Admitting: Family Medicine

## 2020-11-22 ENCOUNTER — Encounter: Payer: Self-pay | Admitting: Family Medicine

## 2020-11-22 VITALS — BP 138/107 | HR 67 | Wt 329.3 lb

## 2020-11-22 DIAGNOSIS — Z01419 Encounter for gynecological examination (general) (routine) without abnormal findings: Secondary | ICD-10-CM | POA: Diagnosis not present

## 2020-11-22 DIAGNOSIS — N898 Other specified noninflammatory disorders of vagina: Secondary | ICD-10-CM | POA: Insufficient documentation

## 2020-11-22 DIAGNOSIS — Z124 Encounter for screening for malignant neoplasm of cervix: Secondary | ICD-10-CM

## 2020-11-22 DIAGNOSIS — R102 Pelvic and perineal pain: Secondary | ICD-10-CM

## 2020-11-22 DIAGNOSIS — N939 Abnormal uterine and vaginal bleeding, unspecified: Secondary | ICD-10-CM

## 2020-11-22 NOTE — Assessment & Plan Note (Signed)
?   Etiology--check u/s--may be adenomyosis, given prior c-section vs. Other.

## 2020-11-22 NOTE — Patient Instructions (Signed)
Preventive Care 21-34 Years Old, Female Preventive care refers to lifestyle choices and visits with your health care provider that can promote health and wellness. This includes: A yearly physical exam. This is also called an annual wellness visit. Regular dental and eye exams. Immunizations. Screening for certain conditions. Healthy lifestyle choices, such as: Eating a healthy diet. Getting regular exercise. Not using drugs or products that contain nicotine and tobacco. Limiting alcohol use. What can I expect for my preventive care visit? Physical exam Your health care provider may check your: Height and weight. These may be used to calculate your BMI (body mass index). BMI is a measurement that tells if you are at a healthy weight. Heart rate and blood pressure. Body temperature. Skin for abnormal spots. Counseling Your health care provider may ask you questions about your: Past medical problems. Family's medical history. Alcohol, tobacco, and drug use. Emotional well-being. Home life and relationship well-being. Sexual activity. Diet, exercise, and sleep habits. Work and work environment. Access to firearms. Method of birth control. Menstrual cycle. Pregnancy history. What immunizations do I need?  Vaccines are usually given at various ages, according to a schedule. Your health care provider will recommend vaccines for you based on your age, medicalhistory, and lifestyle or other factors, such as travel or where you work. What tests do I need?  Blood tests Lipid and cholesterol levels. These may be checked every 5 years starting at age 20. Hepatitis C test. Hepatitis B test. Screening Diabetes screening. This is done by checking your blood sugar (glucose) after you have not eaten for a while (fasting). STD (sexually transmitted disease) testing, if you are at risk. BRCA-related cancer screening. This may be done if you have a family history of breast, ovarian, tubal, or  peritoneal cancers. Pelvic exam and Pap test. This may be done every 3 years starting at age 21. Starting at age 30, this may be done every 5 years if you have a Pap test in combination with an HPV test. Talk with your health care provider about your test results, treatment options,and if necessary, the need for more tests. Follow these instructions at home: Eating and drinking  Eat a healthy diet that includes fresh fruits and vegetables, whole grains, lean protein, and low-fat dairy products. Take vitamin and mineral supplements as recommended by your health care provider. Do not drink alcohol if: Your health care provider tells you not to drink. You are pregnant, may be pregnant, or are planning to become pregnant. If you drink alcohol: Limit how much you have to 0-1 drink a day. Be aware of how much alcohol is in your drink. In the [place] one drink equals one 12 oz bottle of beer (355 mL), one 5 oz glass of wine (148 mL), or one 1 oz glass of hard liquor (44 mL).  Lifestyle Take daily care of your teeth and gums. Brush your teeth every morning and night with fluoride toothpaste. Floss one time each day. Stay active. Exercise for at least 30 minutes 5 or more days each week. Do not use any products that contain nicotine or tobacco, such as cigarettes, e-cigarettes, and chewing tobacco. If you need help quitting, ask your health care provider. Do not use drugs. If you are sexually active, practice safe sex. Use a condom or other form of protection to prevent STIs (sexually transmitted infections). If you do not wish to become pregnant, use a form of birth control. If you plan to become pregnant, see your health care   provider for a prepregnancy visit. Find healthy ways to cope with stress, such as: Meditation, yoga, or listening to music. Journaling. Talking to a trusted person. Spending time with friends and family. Safety Always wear your seat belt while driving or riding in a  vehicle. Do not drive: If you have been drinking alcohol. Do not ride with someone who has been drinking. When you are tired or distracted. While texting. Wear a helmet and other protective equipment during sports activities. If you have firearms in your house, make sure you follow all gun safety procedures. Seek help if you have been physically or sexually abused. What's next? Go to your health care provider once a year for an annual wellness visit. Ask your health care provider how often you should have your eyes and teeth checked. Stay up to date on all vaccines. This information is not intended to replace advice given to you by your health care provider. Make sure you discuss any questions you have with your healthcare provider. Document Revised: 12/14/2019 Document Reviewed: 12/27/2017 Elsevier Patient Education  2022 [employer].

## 2020-11-22 NOTE — Progress Notes (Signed)
Subjective:     Tabitha Holland is a 35 y.o. female and is here for a comprehensive physical exam. The patient reports problems - complains of vaginal discharge that is dark, possibly irregular spotting.  She had a Nexplanon in and it is out since 10/15/2020 but bleeding continues. Also with pain in her c-section incision.  The following portions of the patient's history were reviewed and updated as appropriate: allergies, current medications, past family history, past medical history, past social history, past surgical history, and problem list.  Review of Systems Pertinent items noted in HPI and remainder of comprehensive ROS otherwise negative.   Objective:    BP (!) 138/107   Pulse 67   Wt (!) 329 lb 4.8 oz (149.4 kg)   BMI 46.58 kg/m  General appearance: alert, cooperative, appears stated age, and moderately obese Head: Normocephalic, without obvious abnormality, atraumatic Neck: no adenopathy, supple, symmetrical, trachea midline, and thyroid not enlarged, symmetric, no tenderness/mass/nodules Lungs: clear to auscultation bilaterally Breasts: normal appearance, no masses or tenderness Heart: regular rate and rhythm, S1, S2 normal, no murmur, click, rub or gallop Abdomen: soft, non-tender; bowel sounds normal; no masses,  no organomegaly Pelvic: cervix normal in appearance, external genitalia normal, no adnexal masses or tenderness, no cervical motion tenderness, uterus normal size, shape, and consistency, and vagina normal without discharge Extremities: extremities normal, atraumatic, no cyanosis or edema Pulses: 2+ and symmetric Skin: Skin color, texture, turgor normal. No rashes or lesions Lymph nodes: Cervical, supraclavicular, and axillary nodes normal. Neurologic: Grossly normal    Assessment:    Healthy female exam.      Plan:   Problem List Items Addressed This Visit       Unprioritized   Abnormal uterine bleeding    May be left over from Nexplanon. Check  pelvic sono-may need some provera to stop.        Relevant Orders   US PELVIC COMPLETE WITH TRANSVAGINAL   Pelvic pain    ? Etiology--check u/s--may be adenomyosis, given prior c-section vs. Other.       Relevant Orders   US PELVIC COMPLETE WITH TRANSVAGINAL   Other Visit Diagnoses     Vaginal discharge    -  Primary   check wet prep   Relevant Orders   Cervicovaginal ancillary only( [place])   Screening for malignant neoplasm of cervix       Relevant Orders   Cytology - PAP( [place])   Encounter for gynecological examination without abnormal finding          Return in 1 year (on 11/22/2021).    See After Visit Summary for Counseling Recommendations

## 2020-11-22 NOTE — Assessment & Plan Note (Signed)
May be left over from [place]. Check pelvic sono-may need some provera to stop.

## 2020-11-23 LAB — CERVICOVAGINAL ANCILLARY ONLY
Bacterial Vaginitis (gardnerella): NEGATIVE
Candida Glabrata: NEGATIVE
Candida Vaginitis: NEGATIVE
Chlamydia: NEGATIVE
Comment: NEGATIVE
Comment: NEGATIVE
Comment: NEGATIVE
Comment: NEGATIVE
Comment: NEGATIVE
Comment: NORMAL
Neisseria Gonorrhea: NEGATIVE
Trichomonas: NEGATIVE

## 2020-11-26 LAB — CYTOLOGY - PAP
Comment: NEGATIVE
Diagnosis: NEGATIVE
High risk HPV: NEGATIVE

## 2020-11-30 ENCOUNTER — Ambulatory Visit
Admission: RE | Admit: 2020-11-30 | Discharge: 2020-11-30 | Disposition: A | Discharge status: Home / Self Care | Payer: BC Managed Care – PPO | Source: Ambulatory Visit | Attending: Family Medicine | Admitting: Family Medicine

## 2020-11-30 ENCOUNTER — Other Ambulatory Visit: Payer: Self-pay

## 2020-11-30 DIAGNOSIS — N939 Abnormal uterine and vaginal bleeding, unspecified: Secondary | ICD-10-CM | POA: Diagnosis not present

## 2020-11-30 DIAGNOSIS — R102 Pelvic and perineal pain: Secondary | ICD-10-CM | POA: Diagnosis not present

## 2020-11-30 DIAGNOSIS — D250 Submucous leiomyoma of uterus: Secondary | ICD-10-CM | POA: Diagnosis not present

## 2020-12-01 ENCOUNTER — Telehealth: Payer: Self-pay

## 2020-12-01 NOTE — Telephone Encounter (Signed)
Left message for pt to return call to office to make [place] follow up appt.  Message sent to front office to make appt when pt returns call.   Colletta Maryland, RN

## 2020-12-01 NOTE — Telephone Encounter (Signed)
Opened in error

## 2020-12-01 NOTE — Telephone Encounter (Signed)
Call and spoke with pt. Pt scheduled for [place] follow up results with Dr Kennon Rounds on 8/17 at 415pm. Pt agreeable to date and time of appt.  Colletta Maryland, RN

## 2020-12-01 NOTE — Telephone Encounter (Signed)
-----   Message from [name], MD sent at 12/01/2020  8:18 AM EDT ----- Needs f/u appointment to discuss her u/s

## 2020-12-15 ENCOUNTER — Ambulatory Visit (INDEPENDENT_AMBULATORY_CARE_PROVIDER_SITE_OTHER): Payer: BC Managed Care – PPO | Admitting: Family Medicine

## 2020-12-15 ENCOUNTER — Other Ambulatory Visit: Payer: Self-pay

## 2020-12-15 DIAGNOSIS — N939 Abnormal uterine and vaginal bleeding, unspecified: Secondary | ICD-10-CM | POA: Diagnosis not present

## 2020-12-16 ENCOUNTER — Encounter: Payer: Self-pay | Admitting: Family Medicine

## 2020-12-16 NOTE — Progress Notes (Signed)
   Subjective:    Patient ID: Tabitha Holland is a 35 y.o. female presenting with Results  on 12/15/2020  HPI: Patient returns. Had bleeding post Nexplanon removal. She had u/s which shows small submucosal fibroid which may explain her bleeding.  Review of Systems  Constitutional:  Negative for chills and fever.  Respiratory:  Negative for shortness of breath.   Cardiovascular:  Negative for chest pain.  Gastrointestinal:  Negative for abdominal pain, nausea and vomiting.  Genitourinary:  Negative for dysuria.  Skin:  Negative for rash.     Objective:    BP 136/86   Pulse (!) 59   Ht '5\' 10"'$  (1.778 m)   Wt (!) 327 lb 6.4 oz (148.5 kg)   LMP 12/11/2020 (Exact Date)   BMI 46.98 kg/m  Physical Exam Constitutional:      General: She is not in acute distress.    Appearance: She is well-developed.  HENT:     Head: Normocephalic and atraumatic.  Eyes:     General: No scleral icterus. Cardiovascular:     Rate and Rhythm: Normal rate.  Pulmonary:     Effort: Pulmonary effort is normal.  Abdominal:     Palpations: Abdomen is soft.  Musculoskeletal:     Cervical back: Neck supple.  Skin:    General: Skin is warm and dry.  Neurological:     Mental Status: She is alert and oriented to person, place, and time.        Assessment & Plan:   Problem List Items Addressed This Visit       Unprioritized   Abnormal uterine bleeding    Small 1.4 cm submucosal fibroid. After discussion, she desires removal. Will book for D & C with hysteroscopy and Myosure removal. Risks include but are not limited to bleeding, infection, injury to surrounding structures, including bowel, bladder and ureters, blood clots, and death.  Likelihood of success is high.        Total time in review of prior notes, imaging, anatomy review of fibroid location and ways to remove them, her future fertility plans, history taking, review with patient, exam, note writing, discussion of options, plan for  next steps, alternatives and risks of treatment: 36 minutes.  Return in about 3 months (around 03/17/2021) for postop check.  [name] 12/16/2020 8:24 AM

## 2020-12-16 NOTE — Assessment & Plan Note (Signed)
Small 1.4 cm submucosal fibroid. After discussion, she desires removal. Will book for D & C with hysteroscopy and Myosure removal. Risks include but are not limited to bleeding, infection, injury to surrounding structures, including bowel, bladder and ureters, blood clots, and death.  Likelihood of success is high.

## 2020-12-28 ENCOUNTER — Telehealth: Payer: Self-pay | Admitting: *Deleted

## 2020-12-28 ENCOUNTER — Encounter: Payer: Self-pay | Admitting: *Deleted

## 2020-12-28 NOTE — Telephone Encounter (Signed)
Call to patient. Surgery date options discussed and patient agreeable to 02-09-21. Post-op restrictions of no driving x 24 hours and day following procedure to recover. Surgery instructions reviewed and advised will receive pre-op call and letter in mail.  Encounter closed.

## 2021-02-02 ENCOUNTER — Other Ambulatory Visit: Payer: Self-pay

## 2021-02-02 ENCOUNTER — Encounter (HOSPITAL_BASED_OUTPATIENT_CLINIC_OR_DEPARTMENT_OTHER): Payer: Self-pay | Admitting: Family Medicine

## 2021-02-02 DIAGNOSIS — D250 Submucous leiomyoma of uterus: Secondary | ICD-10-CM

## 2021-02-02 NOTE — H&P (Signed)
Tabitha Holland is an 35 y.o. [id number] female.   Chief Complaint: Bleeding  HPI: h/o 1.4 submucosal fibroid with on-going uterine bleeding, desiring definitive treatment.  Past Medical History:  Diagnosis Date   Anxiety    Depression    History of anemia    per pt on 02/02/2021   History of suicidal ideation 10/02/2019   Hx of wisdom tooth extraction    Hypertension    Migraines    occasional per pt 02/02/2021   Morbid obesity ([place])     Past Surgical History:  Procedure Laterality Date   CESAREAN SECTION N/A 10/28/2013   Procedure: CESAREAN SECTION;  Surgeon: Emily Filbert, MD;  Location: [facility];  Service: Obstetrics;  Laterality: N/A;   CESAREAN SECTION N/A 09/15/2015   Procedure: CESAREAN SECTION;  Surgeon: Mora Bellman, MD;  Location: [facility];  Service: Obstetrics;  Laterality: N/A;   CESAREAN SECTION N/A 05/26/2018   Procedure: CESAREAN SECTION;  Surgeon: Woodroe Mode, MD;  Location: [facility];  Service: Obstetrics;  Laterality: N/A;   CHOLECYSTECTOMY     WISDOM TOOTH EXTRACTION      Family History  Problem Relation Age of Onset   Hypertension Sister    Social History:  reports that she has been smoking cigarettes. She has been smoking an average of .25 packs per day. She has never used smokeless tobacco. She reports current alcohol use. She reports current drug use. Drug: Marijuana.  Allergies:  Allergies  Allergen Reactions   Codeine Other (See Comments)    migraines   Hydrocodone Other (See Comments)    migraines   Percocet [Oxycodone-Acetaminophen] Other (See Comments)    migraines    No medications prior to admission.    A comprehensive review of systems was negative.  Height 5\' 10"  (1.778 m), weight (!) 146.5 kg, last menstrual period 01/29/2021. General appearance: alert, cooperative, appears stated age, and moderately obese Head: Normocephalic, without obvious abnormality, atraumatic Neck: supple, symmetrical, trachea  midline Lungs:  normal effort Heart: regular rate and rhythm Abdomen: soft, non-tender; bowel sounds normal; no masses,  no organomegaly Extremities: Homans sign is negative, no sign of DVT Skin: Skin color, texture, turgor normal. No rashes or lesions Neurologic: Grossly normal   Lab Results  Component Value Date   WBC 7.9 08/03/2020   HGB 12.0 08/03/2020   HCT 35.9 08/03/2020   MCV 88 08/03/2020   PLT 181 08/03/2020   Lab Results  Component Value Date   PREGTESTUR NEGATIVE 02/07/2019   HCG <5.0 10/02/2019     Assessment/Plan Principal Problem:   Submucous uterine fibroid Active Problems:   Abnormal uterine bleeding  For dilation and curettage with hysteroscopy and myosure resection. Risks include but are not limited to bleeding, infection, injury to surrounding structures, including bowel, bladder and ureters, blood clots, and death.  Likelihood of success is high.    Tabitha Holland 02/02/2021, 5:37 PM

## 2021-02-02 NOTE — Progress Notes (Addendum)
Spoke w/ via phone for pre-op interview---patient Lab needs dos----ISTAT, urine POC , requested orders via Epic IB from Dr. Darron Doom on 02/02/2021             Lab results------11/11/20 chest xray in chart, 11/11/20 EKG from [facility] in chart COVID test -----patient states asymptomatic no test needed Arrive at -------1100 NPO after MN NO Solid Food.  Clear liquids from MN until---1000 Med rec completed Medications to take morning of surgery -----Norvasc, Clonidine, Clonazepam prn Diabetic medication -----none Patient instructed no nail polish to be worn day of surgery Patient instructed to bring photo id and insurance card day of surgery Patient aware to have Driver (ride ) / caregiver    for 24 hours after surgery girlfriend Tabitha Holland Patient Special Instructions -----none Pre-Op special Istructions -----orders from surgeon requested via [facility] [place] on 02/02/2021 Patient verbalized understanding of instructions that were given at this phone interview. Patient denies shortness of breath, chest pain, fever, cough at this phone interview.

## 2021-02-02 NOTE — Progress Notes (Signed)
Spoke with patient for pre-operative phone call on 02/02/2021. She stated that she had only been taking her clonidine as needed for sleep. I told her clonidine was for her blood pressure. She had gotten clonidine and clonazepam confused. Patient stated that she would start taking her clonidine as directed.

## 2021-02-09 ENCOUNTER — Telehealth: Payer: Self-pay | Admitting: Radiology

## 2021-02-09 ENCOUNTER — Encounter (HOSPITAL_BASED_OUTPATIENT_CLINIC_OR_DEPARTMENT_OTHER): Payer: Self-pay | Admitting: Family Medicine

## 2021-02-09 ENCOUNTER — Ambulatory Visit (HOSPITAL_BASED_OUTPATIENT_CLINIC_OR_DEPARTMENT_OTHER): Payer: BC Managed Care – PPO | Admitting: Anesthesiology

## 2021-02-09 ENCOUNTER — Encounter (HOSPITAL_BASED_OUTPATIENT_CLINIC_OR_DEPARTMENT_OTHER)
Admission: RE | Disposition: A | Discharge status: Home / Self Care | Payer: Self-pay | Source: Home / Self Care | Attending: Family Medicine

## 2021-02-09 ENCOUNTER — Ambulatory Visit (HOSPITAL_BASED_OUTPATIENT_CLINIC_OR_DEPARTMENT_OTHER)
Admission: RE | Admit: 2021-02-09 | Discharge: 2021-02-09 | Disposition: A | Discharge status: Home / Self Care | Payer: BC Managed Care – PPO | Attending: Family Medicine | Admitting: Family Medicine

## 2021-02-09 DIAGNOSIS — F17210 Nicotine dependence, cigarettes, uncomplicated: Secondary | ICD-10-CM | POA: Diagnosis not present

## 2021-02-09 DIAGNOSIS — Z9049 Acquired absence of other specified parts of digestive tract: Secondary | ICD-10-CM | POA: Diagnosis not present

## 2021-02-09 DIAGNOSIS — F418 Other specified anxiety disorders: Secondary | ICD-10-CM | POA: Diagnosis not present

## 2021-02-09 DIAGNOSIS — D250 Submucous leiomyoma of uterus: Secondary | ICD-10-CM

## 2021-02-09 DIAGNOSIS — Z98891 History of uterine scar from previous surgery: Secondary | ICD-10-CM | POA: Insufficient documentation

## 2021-02-09 DIAGNOSIS — D259 Leiomyoma of uterus, unspecified: Secondary | ICD-10-CM | POA: Diagnosis not present

## 2021-02-09 DIAGNOSIS — N939 Abnormal uterine and vaginal bleeding, unspecified: Secondary | ICD-10-CM

## 2021-02-09 DIAGNOSIS — Z6841 Body Mass Index (BMI) 40.0 and over, adult: Secondary | ICD-10-CM | POA: Insufficient documentation

## 2021-02-09 DIAGNOSIS — Z885 Allergy status to narcotic agent status: Secondary | ICD-10-CM | POA: Diagnosis not present

## 2021-02-09 DIAGNOSIS — F1290 Cannabis use, unspecified, uncomplicated: Secondary | ICD-10-CM | POA: Diagnosis not present

## 2021-02-09 DIAGNOSIS — I10 Essential (primary) hypertension: Secondary | ICD-10-CM | POA: Diagnosis not present

## 2021-02-09 HISTORY — DX: Migraine, unspecified, not intractable, without status migrainosus: G43.909

## 2021-02-09 HISTORY — DX: Personal history of diseases of the blood and blood-forming organs and certain disorders involving the immune mechanism: Z86.2

## 2021-02-09 HISTORY — DX: Depression, unspecified: F32.A

## 2021-02-09 HISTORY — DX: Anxiety disorder, unspecified: F41.9

## 2021-02-09 HISTORY — PX: DILATATION & CURETTAGE/HYSTEROSCOPY WITH MYOSURE: SHX6511

## 2021-02-09 LAB — POCT I-STAT, CHEM 8
BUN: 10 mg/dL (ref 6–20)
Calcium, Ion: 1.14 mmol/L — ABNORMAL LOW (ref 1.15–1.40)
Chloride: 104 mmol/L (ref 98–111)
Creatinine, Ser: 0.7 mg/dL (ref 0.44–1.00)
Glucose, Bld: 77 mg/dL (ref 70–99)
HCT: 36 % (ref 36.0–46.0)
Hemoglobin: 12.2 g/dL (ref 12.0–15.0)
Potassium: 4.8 mmol/L (ref 3.5–5.1)
Sodium: 141 mmol/L (ref 135–145)
TCO2: 30 mmol/L (ref 22–32)

## 2021-02-09 LAB — CBC
HCT: 36.2 % (ref 36.0–46.0)
Hemoglobin: 11.9 g/dL — ABNORMAL LOW (ref 12.0–15.0)
MCH: 30 pg (ref 26.0–34.0)
MCHC: 32.9 g/dL (ref 30.0–36.0)
MCV: 91.2 fL (ref 80.0–100.0)
Platelets: 180 10*3/uL (ref 150–400)
RBC: 3.97 MIL/uL (ref 3.87–5.11)
RDW: 13.5 % (ref 11.5–15.5)
WBC: 6.7 10*3/uL (ref 4.0–10.5)
nRBC: 0 % (ref 0.0–0.2)

## 2021-02-09 LAB — POCT PREGNANCY, URINE: Preg Test, Ur: NEGATIVE

## 2021-02-09 SURGERY — DILATATION & CURETTAGE/HYSTEROSCOPY WITH MYOSURE
Anesthesia: General | Site: Vagina

## 2021-02-09 MED ORDER — LACTATED RINGERS IV SOLN: INTRAVENOUS | Status: DC

## 2021-02-09 MED ORDER — GABAPENTIN 300 MG PO CAPS
300.0000 mg | ORAL_CAPSULE | ORAL | Status: AC
  Administered 2021-02-09: 300 mg via ORAL

## 2021-02-09 MED ORDER — PROMETHAZINE HCL 25 MG/ML IJ SOLN: 6.2500 mg | INTRAMUSCULAR | Status: DC | PRN

## 2021-02-09 MED ORDER — MIDAZOLAM HCL 2 MG/2ML IJ SOLN
INTRAMUSCULAR | Status: AC
  Filled 2021-02-09: qty 2

## 2021-02-09 MED ORDER — MIDAZOLAM HCL 5 MG/5ML IJ SOLN
INTRAMUSCULAR | Status: DC | PRN
  Administered 2021-02-09: 2 mg via INTRAVENOUS

## 2021-02-09 MED ORDER — SODIUM CHLORIDE 0.9 % IR SOLN
Status: DC | PRN
  Administered 2021-02-09: 3000 mL

## 2021-02-09 MED ORDER — ACETAMINOPHEN 500 MG PO TABS
ORAL_TABLET | ORAL | Status: AC
  Filled 2021-02-09: qty 2

## 2021-02-09 MED ORDER — KETOROLAC TROMETHAMINE 30 MG/ML IJ SOLN
INTRAMUSCULAR | Status: AC
  Filled 2021-02-09: qty 1

## 2021-02-09 MED ORDER — DEXAMETHASONE SODIUM PHOSPHATE 10 MG/ML IJ SOLN
INTRAMUSCULAR | Status: DC | PRN
  Administered 2021-02-09: 10 mg via INTRAVENOUS

## 2021-02-09 MED ORDER — FENTANYL CITRATE (PF) 100 MCG/2ML IJ SOLN
INTRAMUSCULAR | Status: AC
  Filled 2021-02-09: qty 2

## 2021-02-09 MED ORDER — ONDANSETRON HCL 4 MG/2ML IJ SOLN
INTRAMUSCULAR | Status: AC
  Filled 2021-02-09: qty 2

## 2021-02-09 MED ORDER — ACETAMINOPHEN 500 MG PO TABS
1000.0000 mg | ORAL_TABLET | ORAL | Status: AC
  Administered 2021-02-09: 1000 mg via ORAL

## 2021-02-09 MED ORDER — POVIDONE-IODINE 10 % EX SWAB: 2.0000 "application " | Freq: Once | CUTANEOUS | Status: DC

## 2021-02-09 MED ORDER — FENTANYL CITRATE (PF) 100 MCG/2ML IJ SOLN
25.0000 ug | INTRAMUSCULAR | Status: DC | PRN
  Administered 2021-02-09 (×2): 50 ug via INTRAVENOUS

## 2021-02-09 MED ORDER — PROPOFOL 10 MG/ML IV BOLUS
INTRAVENOUS | Status: DC | PRN
  Administered 2021-02-09: 300 mg via INTRAVENOUS

## 2021-02-09 MED ORDER — KETOROLAC TROMETHAMINE 30 MG/ML IJ SOLN
INTRAMUSCULAR | Status: DC | PRN
  Administered 2021-02-09: 30 mg via INTRAVENOUS

## 2021-02-09 MED ORDER — FENTANYL CITRATE (PF) 100 MCG/2ML IJ SOLN
INTRAMUSCULAR | Status: DC | PRN
  Administered 2021-02-09: 50 ug via INTRAVENOUS
  Administered 2021-02-09: 100 ug via INTRAVENOUS
  Administered 2021-02-09: 50 ug via INTRAVENOUS

## 2021-02-09 MED ORDER — PROPOFOL 10 MG/ML IV BOLUS
INTRAVENOUS | Status: AC
  Filled 2021-02-09: qty 20

## 2021-02-09 MED ORDER — DEXAMETHASONE SODIUM PHOSPHATE 10 MG/ML IJ SOLN
INTRAMUSCULAR | Status: AC
  Filled 2021-02-09: qty 1

## 2021-02-09 MED ORDER — AMISULPRIDE (ANTIEMETIC) 5 MG/2ML IV SOLN: 10.0000 mg | Freq: Once | INTRAVENOUS | Status: DC | PRN

## 2021-02-09 MED ORDER — GABAPENTIN 300 MG PO CAPS
ORAL_CAPSULE | ORAL | Status: AC
  Filled 2021-02-09: qty 1

## 2021-02-09 MED ORDER — KETOROLAC TROMETHAMINE 30 MG/ML IJ SOLN: 30.0000 mg | Freq: Once | INTRAMUSCULAR | Status: DC | PRN

## 2021-02-09 MED ORDER — BUPIVACAINE-EPINEPHRINE 0.25% -1:200000 IJ SOLN
INTRAMUSCULAR | Status: DC | PRN
  Administered 2021-02-09: 10 mL

## 2021-02-09 MED ORDER — ONDANSETRON HCL 4 MG/2ML IJ SOLN
INTRAMUSCULAR | Status: DC | PRN
Start: 2021-02-09 — End: 2021-02-09
  Administered 2021-02-09: 4 mg via INTRAVENOUS

## 2021-02-09 MED ORDER — LIDOCAINE 2% (20 MG/ML) 5 ML SYRINGE
INTRAMUSCULAR | Status: AC
  Filled 2021-02-09: qty 5

## 2021-02-09 SURGICAL SUPPLY — 20 items
GAUZE 4X4 16PLY ~~LOC~~+RFID DBL (SPONGE) ×2 IMPLANT
GLOVE SURG LTX SZ7 (GLOVE) ×2 IMPLANT
GLOVE SURG NEOP MICRO LF SZ6.5 (GLOVE) ×2 IMPLANT
GLOVE SURG UNDER POLY LF SZ7 (GLOVE) ×6 IMPLANT
GOWN STRL REUS W/ TWL LRG LVL3 (GOWN DISPOSABLE) ×1 IMPLANT
GOWN STRL REUS W/TWL LRG LVL3 (GOWN DISPOSABLE) ×2
GOWN STRL REUS W/TWL XL LVL3 (GOWN DISPOSABLE) ×2 IMPLANT
IV NS IRRIG 3000ML ARTHROMATIC (IV SOLUTION) ×2 IMPLANT
KIT PROCEDURE FLUENT (KITS) ×2 IMPLANT
KIT TURNOVER CYSTO (KITS) ×2 IMPLANT
MYOSURE XL FIBROID (MISCELLANEOUS) ×2
Myosure XL Fibroid ×2 IMPLANT
PACK VAGINAL MINOR WOMEN LF (CUSTOM PROCEDURE TRAY) ×2 IMPLANT
PAD OB MATERNITY 4.3X12.25 (PERSONAL CARE ITEMS) ×2 IMPLANT
PAD PREP 24X48 CUFFED NSTRL (MISCELLANEOUS) ×2 IMPLANT
SEAL ROD LENS SCOPE MYOSURE (ABLATOR) ×2 IMPLANT
SLEEVE SCD COMPRESS KNEE MED (STOCKING) ×2 IMPLANT
SOL PREP POV-IOD 4OZ 10% (MISCELLANEOUS) ×2 IMPLANT
SYSTEM TISS REMOVAL MYOSURE XL (MISCELLANEOUS) ×1 IMPLANT
TOWEL OR 17X26 10 PK STRL BLUE (TOWEL DISPOSABLE) ×2 IMPLANT

## 2021-02-09 NOTE — Interval H&P Note (Signed)
History and Physical Interval Note:  02/09/2021 12:12 PM  Tabitha Holland  has presented today for surgery, with the diagnosis of AUB, fibroid.  The various methods of treatment have been discussed with the patient and family. After consideration of risks, benefits and other options for treatment, the patient has consented to  Procedure(s) with comments: [place] (N/A) - fibroid resection as a surgical intervention.  The patient's history has been reviewed, patient examined, no change in status, stable for surgery.  I have reviewed the patient's chart and labs.  Questions were answered to the patient's satisfaction.     [name]

## 2021-02-09 NOTE — Anesthesia Preprocedure Evaluation (Addendum)
Anesthesia Evaluation  Patient identified by MRN, date of birth, ID band Patient awake    Reviewed: Allergy & Precautions, NPO status , Patient's Chart, lab work & pertinent test results  Airway Mallampati: I  TM Distance: >3 FB Neck ROM: Full    Dental no notable dental hx.    Pulmonary Current Smoker and Patient abstained from smoking.,    Pulmonary exam normal breath sounds clear to auscultation       Cardiovascular hypertension, Pt. on medications Normal cardiovascular exam Rhythm:Regular Rate:Normal     Neuro/Psych  Headaches, PSYCHIATRIC DISORDERS Anxiety Depression    GI/Hepatic negative GI ROS, (+)     substance abuse  ,   Endo/Other  Morbid obesity  Renal/GU negative Renal ROS     Musculoskeletal negative musculoskeletal ROS (+)   Abdominal (+) + obese,   Peds  Hematology negative hematology ROS (+)   Anesthesia Other Findings AUB  Fibroid  Reproductive/Obstetrics hcg negative                            Anesthesia Physical Anesthesia Plan  ASA: 3  Anesthesia Plan: General   Post-op Pain Management:    Induction: Intravenous  PONV Risk Score and Plan: 3 and Ondansetron, Dexamethasone, Midazolam and Treatment may vary due to age or medical condition  Airway Management Planned: LMA  Additional Equipment:   Intra-op Plan:   Post-operative Plan: Extubation in OR  Informed Consent: I have reviewed the patients History and Physical, chart, labs and discussed the procedure including the risks, benefits and alternatives for the proposed anesthesia with the patient or authorized representative who has indicated his/her understanding and acceptance.     Dental advisory given  Plan Discussed with: CRNA  Anesthesia Plan Comments:        Anesthesia Quick Evaluation

## 2021-02-09 NOTE — Discharge Instructions (Signed)
Post Anesthesia Home Care Instructions  Activity: Get plenty of rest for the remainder of the day. A responsible individual must stay with you for 24 hours following the procedure.  For the next 24 hours, DO NOT: -Drive a car -Paediatric nurse -Drink alcoholic beverages -Take any medication unless instructed by your physician -Make any legal decisions or sign important papers.  Meals: Start with liquid foods such as gelatin or soup. Progress to regular foods as tolerated. Avoid greasy, spicy, heavy foods. If nausea and/or vomiting occur, drink only clear liquids until the nausea and/or vomiting subsides. Call your physician if vomiting continues.  Special Instructions/Symptoms: Your throat may feel dry or sore from the anesthesia or the breathing tube placed in your throat during surgery. If this causes discomfort, gargle with warm salt water. The discomfort should disappear within 24 hours.     D & C Home care Instructions:   Personal hygiene:  Used sanitary napkins for vaginal drainage not tampons. Shower or tub bathe the day after your procedure. No douching until bleeding stops. Always wipe from front to back after  Elimination.  Activity: Do not drive or operate any equipment today. The effects of the anesthesia are still present and drowsiness may result. Rest today, not necessarily flat bed rest, just take it easy. You may resume your normal activity in one to 2 days.  Sexual activity: No intercourse for one week or as indicated by your physician  Diet: Eat a light diet as desired this evening. You may resume a regular diet tomorrow.  Return to work: One to 2 days.  General Expectations of your surgery: Vaginal bleeding should be no heavier than a normal period. Spotting may continue up to 10 days. Mild cramps may continue for a couple of days. You may have a regular period in 2-6 weeks.  Unexpected observations call your doctor if these occur: persistent or heavy  bleeding. Severe abdominal cramping or pain. Elevation of temperature greater than 100F.  Call for an appointment in one week.     DISCHARGE INSTRUCTIONS: HYSTEROSCOPY The following instructions have been prepared to help you care for yourself upon your return home.   May take Ibuprofen after 7:30 pm and tylenol after 5:45 pm if needed  May take stool softner while taking narcotic pain medication to prevent constipation.  Drink plenty of water.  Personal hygiene:  Use sanitary pads for vaginal drainage, not tampons.  Shower the day after your procedure.  NO tub baths, pools or Jacuzzis for 2-3 weeks.  Wipe front to back after using the bathroom.  Activity and limitations:  Do NOT drive or operate any equipment for 24 hours. The effects of anesthesia are still present and drowsiness may result.  Do NOT rest in bed all day.  Walking is encouraged.  Walk up and down stairs slowly.  You may resume your normal activity in one to two days or as indicated by your physician. Sexual activity: NO intercourse for at least 2 weeks after the procedure, or as indicated by your Doctor.  Diet: Eat a light meal as desired this evening. You may resume your usual diet tomorrow.  Return to Work: You may resume your work activities in one to two days or as indicated by Marine scientist.  What to expect after your surgery: Expect to have vaginal bleeding/discharge for 2-3 days and spotting for up to 10 days. It is not unusual to have soreness for up to 1-2 weeks. You may have a slight burning  sensation when you urinate for the first day. Mild cramps may continue for a couple of days. You may have a regular period in 2-6 weeks.  Call your doctor for any of the following:  Excessive vaginal bleeding or clotting, saturating and changing one pad every hour.  Inability to urinate 6 hours after discharge from hospital.  Pain not relieved by pain medication.  Fever of 100.4 F or greater.  Unusual  vaginal discharge or odor.     [facility] [phone]

## 2021-02-09 NOTE — Anesthesia Procedure Notes (Signed)
Procedure Name: LMA Insertion Date/Time: 02/09/2021 1:03 PM Performed by: Gwyndolyn Saxon, CRNA Pre-anesthesia Checklist: Patient identified, Emergency Drugs available, Suction available and Patient being monitored Patient Re-evaluated:Patient Re-evaluated prior to induction Oxygen Delivery Method: Circle system utilized Preoxygenation: Pre-oxygenation with 100% oxygen Induction Type: IV induction Ventilation: Mask ventilation without difficulty LMA: LMA with gastric port inserted LMA Size: 4.0 Number of attempts: 1 Airway Equipment and Method: Patient positioned with wedge pillow Placement Confirmation: positive ETCO2 and breath sounds checked- equal and bilateral Tube secured with: Tape Dental Injury: Teeth and Oropharynx as per pre-operative assessment

## 2021-02-09 NOTE — Op Note (Signed)
Preoperative diagnoses:  submucosal fibroid  Postoperative diagnosis: Same  Procedure: D & C, hysteroscopy, Myosure resection of fibroid x 2  Surgeon: Standley Dakins. Kennon Rounds, MD  Anesthesia: Duard Brady Oleta Mouse, MD, paracervical block  Findings:  1.5 cm fibroid and one sub-centimeter fibroid  Fluid deficit: 1070 cc  Estimated blood loss: Minimum  Pathology: Endometrial curettings  Indications for procedure: 35 y.o. [id number] with history of abnormal bleeding who presents for  Procedure: The patient was taken to the operating room where anesthesia was administered. SCDs were in place.  Time out was performed. Patient was placed in dorsolithotomy in [place]. She was prepped and draped in the usual sterile fashion.A speculum was placeed in the vagina. The cervix was visualized anteriorly and grasped with a single-tooth tenaculum. Paracervical block was performed with 1% lidocaine plain with 20 cc injected. The uterus sounded to 8 cm. Sequential dilation was performed with Kennon Rounds dilators. The hysteroscope was inserted and the endometrial cavity and inspected. There were above findings noted in the endometrial cavity with both ostia seen.  The Myosure device used for resection of fibroids x 2. The hysteroscope was removed. All instruments were removed from the vagina. All instrument, needle and lap counts were correct x2. The patient was awakened and is recovering in stable condition.  [name] MD 02/09/2021 1:33 PM

## 2021-02-09 NOTE — Telephone Encounter (Signed)
Left voicemail with virtual postop appointment information with Dr Kennon Rounds on 03/02/21 @ 10:55

## 2021-02-09 NOTE — Transfer of Care (Signed)
Immediate Anesthesia Transfer of Care Note  Patient: Tabitha Holland  Procedure(s) Performed: DILATATION & CURETTAGE/HYSTEROSCOPY WITH MYOSURE (Vagina )  Patient Location: PACU  Anesthesia Type:General  Level of Consciousness: drowsy and patient cooperative  Airway & Oxygen Therapy: Patient Spontanous Breathing  Post-op Assessment: Report given to RN and Post -op Vital signs reviewed and stable  Post vital signs: Reviewed and stable  Last Vitals:  Vitals Value Taken Time  BP 143/86 02/09/21 1345  Temp 36.3 C 02/09/21 1342  Pulse 52 02/09/21 1351  Resp 15 02/09/21 1351  SpO2 100 % 02/09/21 1351  Vitals shown include unvalidated device data.  Last Pain:  Vitals:   02/09/21 1342  TempSrc:   PainSc: Asleep      Patients Stated Pain Goal: 6 (85/92/92 4462)  Complications: No notable events documented.

## 2021-02-10 ENCOUNTER — Encounter (HOSPITAL_BASED_OUTPATIENT_CLINIC_OR_DEPARTMENT_OTHER): Payer: Self-pay | Admitting: Family Medicine

## 2021-02-10 LAB — SURGICAL PATHOLOGY

## 2021-02-15 NOTE — Anesthesia Postprocedure Evaluation (Signed)
Anesthesia Post Note  Patient: Tabitha Holland  Procedure(s) Performed: DILATATION & CURETTAGE/HYSTEROSCOPY WITH MYOSURE (Vagina )     Patient location during evaluation: PACU Anesthesia Type: General Level of consciousness: awake and alert Pain management: pain level controlled Vital Signs Assessment: post-procedure vital signs reviewed and stable Respiratory status: spontaneous breathing, nonlabored ventilation, respiratory function stable and patient connected to nasal cannula oxygen Cardiovascular status: blood pressure returned to baseline and stable Postop Assessment: no apparent nausea or vomiting Anesthetic complications: no   No notable events documented.  Last Vitals:  Vitals:   02/09/21 1445 02/09/21 1521  BP: (!) 142/94 (!) 155/107  Pulse: (!) 48 (!) 46  Resp: 13 16  Temp:  (!) 36.4 C  SpO2: 100% 100%    Last Pain:  Vitals:   02/09/21 1430  TempSrc:   PainSc: 4                  Halleigh Comes

## 2021-03-02 ENCOUNTER — Encounter: Payer: BC Managed Care – PPO | Admitting: Family Medicine

## 2021-03-02 NOTE — Progress Notes (Signed)
1054- called patient for virtual visit. No answer, LVM with instructions to log onto visit. If unable to keep appointment please call to office to reschedule. Will re-attempt to call patient  Altha Harm, [place]

## 2021-03-03 NOTE — Progress Notes (Signed)
Patient did not keep appointment today. She will be called to reschedule.  This encounter was created in error - please disregard.

## 2021-06-24 ENCOUNTER — Other Ambulatory Visit (INDEPENDENT_AMBULATORY_CARE_PROVIDER_SITE_OTHER): Payer: Self-pay | Admitting: Primary Care

## 2021-06-24 DIAGNOSIS — Z760 Encounter for issue of repeat prescription: Secondary | ICD-10-CM

## 2021-06-24 DIAGNOSIS — I10 Essential (primary) hypertension: Secondary | ICD-10-CM

## 2021-07-22 ENCOUNTER — Emergency Department (HOSPITAL_BASED_OUTPATIENT_CLINIC_OR_DEPARTMENT_OTHER): Payer: BC Managed Care – PPO

## 2021-07-22 ENCOUNTER — Other Ambulatory Visit: Payer: Self-pay

## 2021-07-22 ENCOUNTER — Emergency Department (HOSPITAL_BASED_OUTPATIENT_CLINIC_OR_DEPARTMENT_OTHER)
Admission: EM | Admit: 2021-07-22 | Discharge: 2021-07-22 | Disposition: A | Discharge status: Home / Self Care | Payer: BC Managed Care – PPO | Attending: Emergency Medicine | Admitting: Emergency Medicine

## 2021-07-22 ENCOUNTER — Encounter (HOSPITAL_BASED_OUTPATIENT_CLINIC_OR_DEPARTMENT_OTHER): Payer: Self-pay | Admitting: Obstetrics and Gynecology

## 2021-07-22 ENCOUNTER — Emergency Department (HOSPITAL_BASED_OUTPATIENT_CLINIC_OR_DEPARTMENT_OTHER): Payer: BC Managed Care – PPO | Admitting: Radiology

## 2021-07-22 ENCOUNTER — Ambulatory Visit (INDEPENDENT_AMBULATORY_CARE_PROVIDER_SITE_OTHER): Payer: Self-pay

## 2021-07-22 DIAGNOSIS — I10 Essential (primary) hypertension: Secondary | ICD-10-CM | POA: Diagnosis not present

## 2021-07-22 DIAGNOSIS — Z79899 Other long term (current) drug therapy: Secondary | ICD-10-CM | POA: Insufficient documentation

## 2021-07-22 DIAGNOSIS — R079 Chest pain, unspecified: Secondary | ICD-10-CM | POA: Diagnosis not present

## 2021-07-22 DIAGNOSIS — M7122 Synovial cyst of popliteal space [Baker], left knee: Secondary | ICD-10-CM | POA: Diagnosis not present

## 2021-07-22 DIAGNOSIS — R0789 Other chest pain: Secondary | ICD-10-CM | POA: Insufficient documentation

## 2021-07-22 DIAGNOSIS — Z0389 Encounter for observation for other suspected diseases and conditions ruled out: Secondary | ICD-10-CM | POA: Diagnosis not present

## 2021-07-22 DIAGNOSIS — M79605 Pain in left leg: Secondary | ICD-10-CM | POA: Diagnosis not present

## 2021-07-22 LAB — BASIC METABOLIC PANEL
Anion gap: 6 (ref 5–15)
BUN: 11 mg/dL (ref 6–20)
CO2: 27 mmol/L (ref 22–32)
Calcium: 9 mg/dL (ref 8.9–10.3)
Chloride: 105 mmol/L (ref 98–111)
Creatinine, Ser: 0.75 mg/dL (ref 0.44–1.00)
GFR, Estimated: >60 mL/min (ref >60)
Glucose, Bld: 80 mg/dL (ref 70–99)
Potassium: 4.1 mmol/L (ref 3.5–5.1)
Sodium: 138 mmol/L (ref 135–145)

## 2021-07-22 LAB — CBC
HCT: 35.6 % — ABNORMAL LOW (ref 36.0–46.0)
Hemoglobin: 11.7 g/dL — ABNORMAL LOW (ref 12.0–15.0)
MCH: 28.7 pg (ref 26.0–34.0)
MCHC: 32.9 g/dL (ref 30.0–36.0)
MCV: 87.3 fL (ref 80.0–100.0)
Platelets: 162 10*3/uL (ref 150–400)
RBC: 4.08 MIL/uL (ref 3.87–5.11)
RDW: 14.3 % (ref 11.5–15.5)
WBC: 7.9 10*3/uL (ref 4.0–10.5)
nRBC: 0 % (ref 0.0–0.2)

## 2021-07-22 LAB — TROPONIN I (HIGH SENSITIVITY)
Troponin I (High Sensitivity): 4 ng/L (ref <18)
Troponin I (High Sensitivity): 4 ng/L (ref <18)

## 2021-07-22 LAB — HCG, QUANTITATIVE, PREGNANCY: hCG, Beta Chain, Quant, S: <1 m[IU]/mL (ref <5)

## 2021-07-22 MED ORDER — IOHEXOL 350 MG/ML SOLN
100.0000 mL | Freq: Once | INTRAVENOUS | Status: AC | PRN
  Administered 2021-07-22: 75 mL via INTRAVENOUS

## 2021-07-22 MED ORDER — IOHEXOL 300 MG/ML  SOLN
100.0000 mL | Freq: Once | INTRAMUSCULAR | Status: DC | PRN
Start: 2021-07-22 — End: 2021-07-22

## 2021-07-22 MED ORDER — ONDANSETRON HCL 8 MG PO TABS: 8.0000 mg | ORAL_TABLET | ORAL | 0 refills | Status: AC

## 2021-07-22 MED ORDER — AMLODIPINE BESYLATE 5 MG PO TABS
10.0000 mg | ORAL_TABLET | Freq: Once | ORAL | Status: AC
  Administered 2021-07-22: 10 mg via ORAL
  Filled 2021-07-22: qty 2

## 2021-07-22 MED ORDER — CYCLOBENZAPRINE HCL 10 MG PO TABS: 10.0000 mg | ORAL_TABLET | Freq: Two times a day (BID) | ORAL | 0 refills | Status: DC | PRN

## 2021-07-22 MED ORDER — HYDROCHLOROTHIAZIDE 25 MG PO TABS
25.0000 mg | ORAL_TABLET | Freq: Once | ORAL | Status: AC
  Administered 2021-07-22: 25 mg via ORAL
  Filled 2021-07-22: qty 1

## 2021-07-22 MED ORDER — ONDANSETRON HCL 4 MG/2ML IJ SOLN
4.0000 mg | Freq: Once | INTRAMUSCULAR | Status: AC
  Administered 2021-07-22: 4 mg via INTRAVENOUS
  Filled 2021-07-22: qty 2

## 2021-07-22 MED ORDER — CLONIDINE HCL 0.1 MG PO TABS
0.1000 mg | ORAL_TABLET | Freq: Once | ORAL | Status: AC
  Administered 2021-07-22: 0.1 mg via ORAL
  Filled 2021-07-22: qty 1

## 2021-07-22 NOTE — ED Triage Notes (Signed)
Patient reports to the ER for chest pain and pain in the left popliteal area. Patient reports pain started in her neck and it went down into her chest and into her arms bilaterally.  ?

## 2021-07-22 NOTE — ED Notes (Signed)
Pt reported shooting pain and tingling going down her left arm for 3 minutes and then subsided. Pt stated that it was a 6 out of 10 before subsiding. ?

## 2021-07-22 NOTE — ED Notes (Signed)
Ultrasound at bedside

## 2021-07-22 NOTE — Telephone Encounter (Signed)
Reason for Disposition ? [1] Chest pain lasts > 5 minutes AND [2] occurred in past 3 days (72 hours) (Exception: feels exactly the same as previously diagnosed heartburn and has accompanying sour taste in mouth) ? ?Protocols used: Chest Pain-A-AH ? ?

## 2021-07-22 NOTE — Telephone Encounter (Signed)
?  Chief Complaint: Chest pain and sudden onset leg pain ?Symptoms: Chest pain intermittant over the past 3 weeks. Today sudden onset of let pain. ?Frequency: 3 weeks ?Pertinent Negatives: Patient denies SOB, Redness of leg warmth, ?Disposition: '[x]'$ ED /'[]'$ Urgent Care (no appt availability in office) / '[]'$ Appointment(In office/virtual)/ '[]'$  [facility]/ '[]'$ Home Care/ '[]'$ Refused Recommended Disposition /'[]'$ [facility]/ '[]'$  Follow-up with PCP ?Additional Notes: PT has been having intermittent chest pain for 3 weeks with pain radiating into arm. Pt states that today she is experiencing leg pain at knee.  ? ?

## 2021-07-22 NOTE — Discharge Instructions (Addendum)
Return to the ED with any new or worsening symptoms such as return of chest pain, shortness of breath ?Please follow-up with your PCP in the next 5 to 7 days concerning blood pressure medication at home.  I I have provided you with Zofran to help with your nausea until you can see your PCP ?Please also discuss with your PCP your Baker's cyst.  I have attached instructional guide to this after visit summary that details the Baker's cyst and what it is ?

## 2021-07-22 NOTE — Telephone Encounter (Signed)
Routed to PCP. Patient was advised to go to ED.  ?

## 2021-07-22 NOTE — ED Provider Notes (Signed)
?[facility] EMERGENCY DEPT ?Provider Note ? ? ?CSN: [id number] ?Arrival date & time: 07/22/21  1111 ? ?  ? ?History ? ?Chief Complaint  ?Patient presents with  ? Chest Pain  ? ? ?Tabitha Holland is a 36 y.o. female with history significant for anxiety, depression, anemia, hypertension, migraines.  Patient presents to ED for evaluation of chest pain as well as left-sided popliteal pain.  Patient states that the "last couple weeks" she has been experiencing chest pain that then progressed to her neck and then into her bilateral arms.  Patient states that this has been occurring for the last 3 weeks.  Patient states that the pain is constant but is sometimes worsened for no apparent reason.  Patient states that she can be sitting down watching TV or braiding her daughter's hair and the chest pain will worsen spontaneously and resolve on its own.  Patient denies any exertional worsening of chest pain. 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  It is important note that this patient states that she has not taken her blood pressure medication for the last 3 weeks.  Patient states that 3 weeks ago she began to consistently vomit up her blood pressure medication regimen and as result she has not been taking it since then.  ? ?Additional issue to report, patient states that this morning she woke up with pain behind her left knee, denies any history of trauma to the area.  Patient denies any history of cardiac disease, blood clots.   ? ?Patient endorsing chest pain, neck pain, arm pain, nausea, vomiting.  Patient denies diarrhea, fevers, shortness of breath, wheezing, visual disturbance, headache. ? ? ?Chest Pain ?Associated symptoms: nausea and vomiting   ?Associated symptoms: no fever, no headache and no shortness of breath   ? ?  ? ?Home Medications ?Prior to Admission medications   ?Medication Sig Start Date End  Date Taking? Authorizing Provider  ?ondansetron (ZOFRAN) 8 MG tablet Take 1 tablet (8 mg total) by mouth 1 day or 1 dose for 21 doses. 07/22/21 08/12/21 Yes Azucena Cecil, PA-C  ?amLODipine (NORVASC) 10 MG tablet Take 1 tablet (10 mg total) by mouth daily. 08/03/20   Kerin Perna, NP  ?celecoxib (CELEBREX) 100 MG capsule Take 1 capsule (100 mg total) by mouth 2 (two) times daily. 08/03/20   Kerin Perna, NP  ?clobetasol cream (TEMOVATE) 0.05 % Hands and feet as needed 08/05/20   [provider]  ?cloNIDine (CATAPRES) 0.2 MG tablet Take 0.5 tablets (0.1 mg total) by mouth daily. ?Patient taking differently: Take 0.1 mg by mouth daily. Patient stated that she has not been taking this medication because she thought it was for sleep. She got this med confused with clonazepam. She will start taking as directed. Per pt on 02/02/2021 08/03/20   Kerin Perna, NP  ?hydrochlorothiazide (HYDRODIURIL) 25 MG tablet Take 1 tablet (25 mg total) by mouth daily. 08/03/20   Kerin Perna, NP  ?Multiple Vitamin (MULTIVITAMIN) tablet Take 1 tablet by mouth daily.    [provider]  ?   ? ?Allergies    ?Codeine, Hydrocodone, and Percocet [oxycodone-acetaminophen]   ? ?Review of Systems   ?Review of Systems  ?Constitutional:  Negative for fever.  ?Eyes:  Negative for visual disturbance.  ?Respiratory:  Negative for shortness of breath and wheezing.   ?Cardiovascular:  Positive for chest pain.  Negative for leg swelling.  ?Gastrointestinal:  Positive for nausea and vomiting. Negative for diarrhea.  ?Musculoskeletal:  Positive for myalgias (bilateral upper extremities) and neck pain. Negative for neck stiffness.  ?Neurological:  Negative for headaches.  ?All other systems reviewed and are negative. ? ?Physical Exam ?Updated Vital Signs ?BP (!) 170/119   Pulse (!) 59   Temp 98.9 ?F (37.2 ?C)   Resp 10   Ht '5\' 10"'$  (1.778 m)   Wt (!) 148.2 kg   LMP 06/22/2021 (Approximate)   SpO2 100%   BMI 46.88  kg/m?  ?Physical Exam ?Vitals and nursing note reviewed.  ?Constitutional:   ?   General: She is not in acute distress. ?   Appearance: She is well-developed. She is obese. She is not ill-appearing, toxic-appearing or diaphoretic.  ?HENT:  ?   Head: Normocephalic and atraumatic.  ?   Nose: Nose normal.  ?   Mouth/Throat:  ?   Mouth: Mucous membranes are moist.  ?   Pharynx: Oropharynx is clear.  ?Eyes:  ?   Extraocular Movements: Extraocular movements intact.  ?   Pupils: Pupils are equal, round, and reactive to light.  ?Neck:  ?   Vascular: No JVD.  ?Cardiovascular:  ?   Rate and Rhythm: Normal rate and regular rhythm.  ?Pulmonary:  ?   Effort: Pulmonary effort is normal. No tachypnea.  ?   Breath sounds: No decreased breath sounds or wheezing.  ?Abdominal:  ?   General: Bowel sounds are normal.  ?   Palpations: Abdomen is soft.  ?   Tenderness: There is no abdominal tenderness.  ?Musculoskeletal:  ?   Cervical back: Normal range of motion and neck supple.  ?   Left knee: Tenderness present.  ?   Right lower leg: No edema.  ?   Left lower leg: No edema.  ?   Comments: Tenderness to left popliteal fossa  ?Skin: ?   General: Skin is warm and dry.  ?   Capillary Refill: Capillary refill takes less than 2 seconds.  ?Neurological:  ?   Mental Status: She is alert and oriented to person, place, and time.  ? ? ?ED Results / Procedures / Treatments   ?Labs ?(all labs ordered are listed, but only abnormal results are displayed) ?Labs Reviewed  ?CBC - Abnormal; Notable for the following components:  ?    Result Value  ? Hemoglobin 11.7 (*)   ? HCT 35.6 (*)   ? All other components within normal limits  ?HCG, QUANTITATIVE, PREGNANCY  ?BASIC METABOLIC PANEL  ?TROPONIN I (HIGH SENSITIVITY)  ?TROPONIN I (HIGH SENSITIVITY)  ? ? ?EKG ?EKG Interpretation ? ?Date/Time:  Friday July 22 2021 11:17:38 EDT ?Ventricular Rate:  62 ?PR Interval:  186 ?QRS Duration: 102 ?QT Interval:  428 ?QTC Calculation: 434 ?R Axis:   25 ?Text  Interpretation: Undetermined rhythm Otherwise normal ECG When compared with ECG of 02-Oct-2019 17:04, Current undetermined rhythm precludes rhythm comparison, needs review Confirmed by Thamas Jaegers ([id number]) on 07/22/2021 1:24:36 PM ? ?Radiology ?DG Chest 2 View ? ?Result Date: 07/22/2021 ?CLINICAL DATA:  Chest pain EXAM: CHEST - 2 VIEW COMPARISON:  11/11/2020 chest radiograph. FINDINGS: Stable cardiomediastinal silhouette with normal heart size. No pneumothorax. No pleural effusion. Lungs appear clear, with no acute consolidative airspace disease and no pulmonary edema. IMPRESSION: No active cardiopulmonary disease. Electronically Signed   By: Ilona Sorrel M.D.   On: 07/22/2021 12:18  ? ?US Venous Img Lower Unilateral Left ? ?Result Date:  07/22/2021 ?CLINICAL DATA:  Left popliteal fossa pain EXAM: LEFT LOWER EXTREMITY VENOUS DOPPLER ULTRASOUND TECHNIQUE: Gray-scale sonography with compression, as well as color and duplex ultrasound, were performed to evaluate the deep venous system(s) from the level of the common femoral vein through the popliteal and proximal calf veins. COMPARISON:  None. FINDINGS: VENOUS Normal compressibility of the common femoral, superficial femoral, and popliteal veins, as well as the visualized calf veins. Visualized portions of profunda femoral vein and great saphenous vein unremarkable. No filling defects to suggest DVT on grayscale or color Doppler imaging. Doppler waveforms show normal direction of venous flow, normal respiratory plasticity and response to augmentation. Limited views of the contralateral common femoral vein are unremarkable. OTHER Trace ovoid heterogeneous collection in the popliteal fossa measures 2.9 x 0.4 x 1.1 cm. Findings are most suggestive of a collapsed Baker's cyst. Limitations: none IMPRESSION: 1. Negative for deep venous thrombosis. 2. Probable small collapsed Baker's cyst. Electronically Signed   By: Jacqulynn Cadet M.D.   On: 07/22/2021 13:58  ? ?CT Angio Chest  Aorta W and/or Wo Contrast ? ?Result Date: 07/22/2021 ?CLINICAL DATA:  36 year old female with concern for acute aortic syndrome EXAM: CT ANGIOGRAPHY CHEST WITH CONTRAST TECHNIQUE: Multidetector CT imaging of the chest

## 2021-07-25 NOTE — Telephone Encounter (Signed)
Agreed -

## 2021-07-28 ENCOUNTER — Ambulatory Visit (INDEPENDENT_AMBULATORY_CARE_PROVIDER_SITE_OTHER): Payer: Self-pay

## 2021-07-28 NOTE — Telephone Encounter (Signed)
Patient experiencing chest pain, unable to connect with Nurse Triage after several attempts  ? ? ? ?Chief Complaint: Pt. Has had chest pain and tingling in her arms for several weeks. Seen in ED and cardiac work up was clear. Has had a tick bite and is concerned about tick borne illness. ?Symptoms: Above ?Frequency: Weeks ?Pertinent Negatives: Patient denies  ?Disposition: '[]'$ ED /'[]'$ Urgent Care (no appt availability in office) / '[]'$ Appointment(In office/virtual)/ '[]'$  [facility]/ '[]'$ Home Care/ '[]'$ Refused Recommended Disposition /'[]'$ [facility]/ '[]'$  Follow-up with PCP ?Additional Notes: Pt. Asking to be worked in . Please advise   ?Answer Assessment - Initial Assessment Questions ?1. LOCATION: "Where does it hurt?"   ?    Left side of chest , shooting pain down ear and neck ?2. RADIATION: "Does the pain go anywhere else?" (e.g., into neck, jaw, arms, back) ?    Tingling in arms ?3. ONSET: "When did the chest pain begin?" (Minutes, hours or days)  ?    Several weeks ago ?4. PATTERN "Does the pain come and go, or has it been constant since it started?"  "Does it get worse with exertion?"  ?    Tingling comes and goes ?5. DURATION: "How long does it last" (e.g., seconds, minutes, hours) ?    Minutes and then goes away ?6. SEVERITY: "How bad is the pain?"  (e.g., Scale 1-10; mild, moderate, or severe) ?   - MILD (1-3): doesn't interfere with normal activities  ?   - MODERATE (4-7): interferes with normal activities or awakens from sleep ?   - SEVERE (8-10): excruciating pain, unable to do any normal activities   ?    Now - 8. Took muscle relaxer and is "coming down now." ?7. CARDIAC RISK FACTORS: "Do you have any history of heart problems or risk factors for heart disease?" (e.g., angina, prior heart attack; diabetes, high blood pressure, high cholesterol, smoker, or strong family history of heart disease) ?    No ?8. PULMONARY RISK FACTORS: "Do you have any history of lung disease?"  (e.g., blood clots  in lung, asthma, emphysema, birth control pills) ?    No ?9. CAUSE: "What do you think is causing the chest pain?" ?    Unsure ?10. OTHER SYMPTOMS: "Do you have any other symptoms?" (e.g., dizziness, nausea, vomiting, sweating, fever, difficulty breathing, cough) ?      No ?11. PREGNANCY: "Is there any chance you are pregnant?" "When was your last menstrual period?" ?      No ? ?Protocols used: Chest Pain-A-AH ? ?

## 2021-07-28 NOTE — Telephone Encounter (Signed)
Please advise 

## 2021-09-28 ENCOUNTER — Ambulatory Visit (INDEPENDENT_AMBULATORY_CARE_PROVIDER_SITE_OTHER): Payer: BC Managed Care – PPO | Admitting: Primary Care

## 2021-09-28 ENCOUNTER — Ambulatory Visit (HOSPITAL_COMMUNITY)
Admission: EM | Admit: 2021-09-28 | Discharge: 2021-09-28 | Disposition: A | Discharge status: Home / Self Care | Payer: Medicaid Other | Attending: Family Medicine | Admitting: Family Medicine

## 2021-09-28 ENCOUNTER — Other Ambulatory Visit: Payer: Self-pay

## 2021-09-28 ENCOUNTER — Encounter (HOSPITAL_COMMUNITY): Payer: Self-pay | Admitting: Emergency Medicine

## 2021-09-28 ENCOUNTER — Encounter (INDEPENDENT_AMBULATORY_CARE_PROVIDER_SITE_OTHER): Payer: Self-pay

## 2021-09-28 DIAGNOSIS — I10 Essential (primary) hypertension: Secondary | ICD-10-CM

## 2021-09-28 DIAGNOSIS — H60392 Other infective otitis externa, left ear: Secondary | ICD-10-CM

## 2021-09-28 DIAGNOSIS — J039 Acute tonsillitis, unspecified: Secondary | ICD-10-CM

## 2021-09-28 LAB — POCT RAPID STREP A, ED / UC: Streptococcus, Group A Screen (Direct): NEGATIVE

## 2021-09-28 MED ORDER — NEOMYCIN-POLYMYXIN-HC 3.5-10000-1 OT SUSP: 4.0000 [drp] | Freq: Three times a day (TID) | OTIC | 0 refills | Status: DC

## 2021-09-28 MED ORDER — AMOXICILLIN 875 MG PO TABS: 875.0000 mg | ORAL_TABLET | Freq: Two times a day (BID) | ORAL | 0 refills | Status: AC

## 2021-09-28 NOTE — ED Triage Notes (Signed)
Sore throat started last night.  This morning, left side of throat is extremely painful, left ear pain, coughing, and fever, chills.  Patient reports there is pain to swallow water.  Has not tried any therapies at home.  Reports family member just getting over an ear infection

## 2021-09-28 NOTE — ED Notes (Signed)
Dr hagler aware of blood pressure

## 2021-09-28 NOTE — ED Provider Notes (Signed)
Martinez   [id number] 09/28/21 Arrival Time: 0909  ASSESSMENT & PLAN:  1. Tonsillitis   2. Other infective acute otitis externa of left ear   3. Elevated blood pressure reading in office with diagnosis of hypertension    No signs of peritonsillar abscess. Discussed.  Given exam will empirically treat for strep in addition to gtt for otitis externa. Throat culture pending. Begin: Meds ordered this encounter  Medications   amoxicillin (AMOXIL) 875 MG tablet    Sig: Take 1 tablet (875 mg total) by mouth 2 (two) times daily for 10 days.    Dispense:  20 tablet    Refill:  0   neomycin-polymyxin-hydrocortisone (CORTISPORIN) 3.5-10000-1 OTIC suspension    Sig: Place 4 drops into the left ear 3 (three) times daily.    Dispense:  10 mL    Refill:  0    Results for orders placed or performed during the hospital encounter of 09/28/21  POCT Rapid Strep A  Result Value Ref Range   Streptococcus, Group A Screen (Direct) NEGATIVE NEGATIVE   Labs Reviewed  CULTURE, GROUP A STREP [facility])  POCT RAPID STREP A, ED / UC    OTC analgesics and throat care as needed    Discharge Instructions      You may use over the counter ibuprofen or acetaminophen as needed.  For a sore throat, over the counter products such as [employer] Peroxyl Mouth Sore Rinse or Chloraseptic Sore Throat Spray may provide some temporary relief. Your rapid strep test was negative today. We have sent your throat swab for culture and will let you know of any positive results.     May f/u here or PCP to recheck BP when feeling better.  Reviewed expectations re: course of current medical issues. Questions answered. Outlined signs and symptoms indicating need for more acute intervention. Patient verbalized understanding. After Visit Summary given.   SUBJECTIVE:  Tabitha Holland is a 36 y.o. female who reports a sore throat. Describes as left-sided sharp pain, esp with swallowing. Onset abrupt  beginning yesterday. Symptoms have gradually worsened since beginning; without voice changes. No respiratory symptoms. Normal PO intake but reports discomfort with swallowing. No specific alleviating factors. Fever: present, low grade, 100-101. No neck pain or swelling. No associated nausea, vomiting, or abdominal pain. No tx PTA.  Increased blood pressure noted today. Reports that she is treated for HTN. Hasn't been able to take medications secondary to ST.    OBJECTIVE:  Vitals:   09/28/21 0957  BP: (!) 174/112  Pulse: 69  Resp: 20  Temp: 99.1 F (37.3 C)  TempSrc: Oral  SpO2: 99%     General appearance: alert; no distress HEENT: throat with moderate erythema and with L-sided tonsillar hypertrophy; with exudates; uvula is midline Neck: supple with FROM; small L cervical LAD Lungs: speaks full sentences without difficulty; unlabored Abd: soft; non-tender Skin: reveals no rash; warm and dry Psychological: alert and cooperative; normal mood and affect  Allergies  Allergen Reactions   Codeine Other (See Comments)    migraines   Hydrocodone Other (See Comments)    migraines   Percocet [Oxycodone-Acetaminophen] Other (See Comments)    migraines    Past Medical History:  Diagnosis Date   Anxiety    Depression    History of anemia    per pt on 02/02/2021   History of suicidal ideation 10/02/2019   Hx of wisdom tooth extraction    Hypertension    Migraines  occasional per pt 02/02/2021   Morbid obesity ([place])    Social History   Socioeconomic History   Marital status: Single    Spouse name: Not on file   Number of children: Not on file   Years of education: Not on file   Highest education level: Not on file  Occupational History   Not on file  Tobacco Use   Smoking status: Some Days    Packs/day: 0.25    Types: Cigarettes   Smokeless tobacco: Never  Vaping Use   Vaping Use: Never used  Substance and Sexual Activity   Alcohol use: Yes    Comment: occasional    Drug use: Yes    Types: Marijuana   Sexual activity: Not Currently    Birth control/protection: None  Other Topics Concern   Not on file  Social History Narrative   Not on file   Social Determinants of Health   Financial Resource Strain: Not on file  Food Insecurity: No Food Insecurity   Worried About Running Out of Food in the Last Year: Never true   Ran Out of Food in the Last Year: Never true  Transportation Needs: No Transportation Needs   Lack of Transportation (Medical): No   Lack of Transportation (Non-Medical): No  Physical Activity: Not on file  Stress: Not on file  Social Connections: Not on file  Intimate Partner Violence: Not on file   Family History  Problem Relation Age of Onset   Hypertension Sister            Vanessa Kick, MD 09/28/21 1038

## 2021-09-28 NOTE — Discharge Instructions (Addendum)
You may use over the counter ibuprofen or acetaminophen as needed.  For a sore throat, over the counter products such as [employer]gate Peroxyl Mouth Sore Rinse or Chloraseptic Sore Throat Spray may provide some temporary relief. Your rapid strep test was negative today. We have sent your throat swab for culture and will let you know of any positive results. 

## 2021-09-30 LAB — CULTURE, GROUP A STREP (THRC)

## 2021-10-21 ENCOUNTER — Emergency Department (HOSPITAL_COMMUNITY): Payer: Medicaid Other

## 2021-10-21 ENCOUNTER — Other Ambulatory Visit: Payer: Self-pay

## 2021-10-21 ENCOUNTER — Encounter (HOSPITAL_COMMUNITY): Payer: Self-pay

## 2021-10-21 ENCOUNTER — Emergency Department (HOSPITAL_COMMUNITY)
Admission: EM | Admit: 2021-10-21 | Discharge: 2021-10-21 | Disposition: A | Discharge status: Home / Self Care | Payer: Medicaid Other | Attending: Emergency Medicine | Admitting: Emergency Medicine

## 2021-10-21 DIAGNOSIS — R1031 Right lower quadrant pain: Secondary | ICD-10-CM | POA: Diagnosis present

## 2021-10-21 DIAGNOSIS — G8929 Other chronic pain: Secondary | ICD-10-CM

## 2021-10-21 DIAGNOSIS — Z91040 Latex allergy status: Secondary | ICD-10-CM | POA: Diagnosis not present

## 2021-10-21 LAB — URINALYSIS, ROUTINE W REFLEX MICROSCOPIC
Bilirubin Urine: NEGATIVE
Glucose, UA: NEGATIVE mg/dL
Hgb urine dipstick: NEGATIVE
Ketones, ur: NEGATIVE mg/dL
Leukocytes,Ua: NEGATIVE
Nitrite: NEGATIVE
Protein, ur: NEGATIVE mg/dL
Specific Gravity, Urine: 1.005 (ref 1.005–1.030)
pH: 7 (ref 5.0–8.0)

## 2021-10-21 LAB — COMPREHENSIVE METABOLIC PANEL
ALT: 10 U/L (ref 0–44)
AST: 12 U/L — ABNORMAL LOW (ref 15–41)
Albumin: 3.2 g/dL — ABNORMAL LOW (ref 3.5–5.0)
Alkaline Phosphatase: 71 U/L (ref 38–126)
Anion gap: 7 (ref 5–15)
BUN: 12 mg/dL (ref 6–20)
CO2: 26 mmol/L (ref 22–32)
Calcium: 8.5 mg/dL — ABNORMAL LOW (ref 8.9–10.3)
Chloride: 107 mmol/L (ref 98–111)
Creatinine, Ser: 0.77 mg/dL (ref 0.44–1.00)
GFR, Estimated: >60 mL/min (ref >60)
Glucose, Bld: 88 mg/dL (ref 70–99)
Potassium: 4.1 mmol/L (ref 3.5–5.1)
Sodium: 140 mmol/L (ref 135–145)
Total Bilirubin: 0.5 mg/dL (ref 0.3–1.2)
Total Protein: 6.5 g/dL (ref 6.5–8.1)

## 2021-10-21 LAB — CBC WITH DIFFERENTIAL/PLATELET
Abs Immature Granulocytes: 0.01 10*3/uL (ref 0.00–0.07)
Basophils Absolute: 0 10*3/uL (ref 0.0–0.1)
Basophils Relative: 0 %
Eosinophils Absolute: 0.1 10*3/uL (ref 0.0–0.5)
Eosinophils Relative: 1 %
HCT: 35.4 % — ABNORMAL LOW (ref 36.0–46.0)
Hemoglobin: 11.3 g/dL — ABNORMAL LOW (ref 12.0–15.0)
Immature Granulocytes: 0 %
Lymphocytes Relative: 30 %
Lymphs Abs: 2.2 10*3/uL (ref 0.7–4.0)
MCH: 28.6 pg (ref 26.0–34.0)
MCHC: 31.9 g/dL (ref 30.0–36.0)
MCV: 89.6 fL (ref 80.0–100.0)
Monocytes Absolute: 0.5 10*3/uL (ref 0.1–1.0)
Monocytes Relative: 6 %
Neutro Abs: 4.6 10*3/uL (ref 1.7–7.7)
Neutrophils Relative %: 63 %
Platelets: 199 10*3/uL (ref 150–400)
RBC: 3.95 MIL/uL (ref 3.87–5.11)
RDW: 13.5 % (ref 11.5–15.5)
WBC: 7.4 10*3/uL (ref 4.0–10.5)
nRBC: 0 % (ref 0.0–0.2)

## 2021-10-21 LAB — I-STAT BETA HCG BLOOD, ED (MC, WL, AP ONLY): I-stat hCG, quantitative: <5 m[IU]/mL (ref <5)

## 2021-10-21 LAB — LIPASE, BLOOD: Lipase: 29 U/L (ref 11–51)

## 2021-10-21 MED ORDER — CLONIDINE HCL 0.2 MG PO TABS
0.2000 mg | ORAL_TABLET | Freq: Once | ORAL | Status: AC
  Administered 2021-10-21: 0.2 mg via ORAL
  Filled 2021-10-21: qty 1

## 2021-10-21 MED ORDER — ONDANSETRON HCL 4 MG/2ML IJ SOLN
4.0000 mg | Freq: Once | INTRAMUSCULAR | Status: AC
  Administered 2021-10-21: 4 mg via INTRAVENOUS
  Filled 2021-10-21: qty 2

## 2021-10-21 MED ORDER — SODIUM CHLORIDE 0.9 % IV BOLUS
500.0000 mL | Freq: Once | INTRAVENOUS | Status: AC
  Administered 2021-10-21: 500 mL via INTRAVENOUS

## 2021-10-21 MED ORDER — AMLODIPINE BESYLATE 5 MG PO TABS
5.0000 mg | ORAL_TABLET | Freq: Once | ORAL | Status: AC
  Administered 2021-10-21: 5 mg via ORAL
  Filled 2021-10-21: qty 1

## 2021-10-21 MED ORDER — MORPHINE SULFATE (PF) 2 MG/ML IV SOLN
2.0000 mg | Freq: Once | INTRAVENOUS | Status: AC
  Administered 2021-10-21: 2 mg via INTRAVENOUS
  Filled 2021-10-21: qty 1

## 2021-10-21 MED ORDER — MORPHINE SULFATE (PF) 4 MG/ML IV SOLN
4.0000 mg | Freq: Once | INTRAVENOUS | Status: AC
  Administered 2021-10-21: 4 mg via INTRAVENOUS
  Filled 2021-10-21: qty 1

## 2021-10-21 MED ORDER — IOHEXOL 300 MG/ML  SOLN
100.0000 mL | Freq: Once | INTRAMUSCULAR | Status: AC | PRN
  Administered 2021-10-21: 100 mL via INTRAVENOUS

## 2021-10-21 NOTE — ED Notes (Signed)
Pt states she has to urinate but pain comes back when she moves. Rn placed purwick.

## 2021-10-21 NOTE — ED Provider Notes (Signed)
[facility] EMERGENCY DEPARTMENT Provider Note   CSN: [id number] Arrival date & time: 10/21/21  1031     History No chief complaint on file.   Tabitha Holland is a 36 y.o. female with history of C-section and chronic abdominal pain presenting today with 2 hours of acute right lower quadrant pain.  Reports she has been struggling with abdominal pain for the past 3 years and that everyone keeps telling her it is gas but she knows it is not.  Says that 2 hours ago she began to have pain that she was unable to tolerate.  She is usually able to "breathe through it" but she was unable to do so today.  Denies nausea, vomiting, diarrhea, urinary symptoms.  Reports her menstrual periods are irregular but is not concerned for pregnancy.  She was seen by GYN and noted to have a 1.4 cm fibroid.  History of cholecystectomy and C-section.  Has never seen GI  HPI     Home Medications Prior to Admission medications   Medication Sig Start Date End Date Taking? Authorizing Provider  amLODipine (NORVASC) 10 MG tablet Take 1 tablet (10 mg total) by mouth daily. 08/03/20  Yes Grayce Sessions, NP  cloNIDine (CATAPRES) 0.2 MG tablet Take 0.5 tablets (0.1 mg total) by mouth daily. 08/03/20  Yes Grayce Sessions, NP  hydrochlorothiazide (HYDRODIURIL) 25 MG tablet Take 1 tablet (25 mg total) by mouth daily. 08/03/20  Yes Grayce Sessions, NP  celecoxib (CELEBREX) 100 MG capsule Take 1 capsule (100 mg total) by mouth 2 (two) times daily. 08/03/20   Grayce Sessions, NP  clobetasol cream (TEMOVATE) 0.05 % Hands and feet as needed 08/05/20   [provider]  cyclobenzaprine (FLEXERIL) 10 MG tablet Take 1 tablet (10 mg total) by mouth 2 (two) times daily as needed for muscle spasms. 07/22/21   Al Decant, PA-C  Multiple Vitamin (MULTIVITAMIN) tablet Take 1 tablet by mouth daily.    [provider]  neomycin-polymyxin-hydrocortisone (CORTISPORIN) 3.5-10000-1 OTIC  suspension Place 4 drops into the left ear 3 (three) times daily. 09/28/21   Mardella Layman, MD      Allergies    Codeine, Fish allergy, Hydrocodone, Latex, Percocet [oxycodone-acetaminophen], and Shellfish allergy    Review of Systems   Review of Systems  Physical Exam Updated Vital Signs BP (!) 158/100   Pulse (!) 55   Temp 97.8 F (36.6 C) (Oral)   Resp 20   Ht 5\' 10"  (1.778 m)   Wt (!) 146.2 kg   LMP 08/27/2021   SpO2 100%   BMI 46.25 kg/m  Physical Exam Vitals and nursing note reviewed.  Constitutional:      General: She is not in acute distress.    Appearance: Normal appearance. She is not ill-appearing.  HENT:     Head: Normocephalic and atraumatic.     Mouth/Throat:     Mouth: Mucous membranes are moist.     Pharynx: Oropharynx is clear.  Eyes:     General: No scleral icterus.    Conjunctiva/sclera: Conjunctivae normal.  Pulmonary:     Effort: Pulmonary effort is normal. No respiratory distress.  Abdominal:     General: Abdomen is flat. There is no distension.     Palpations: Abdomen is soft. There is no mass.     Tenderness: There is abdominal tenderness. There is guarding.     Comments: Generalized tenderness however most severe in right lower quadrant  Skin:  General: Skin is warm and dry.     Coloration: Skin is not jaundiced.     Findings: No rash.  Neurological:     Mental Status: She is alert.  Psychiatric:        Mood and Affect: Mood normal.     ED Results / Procedures / Treatments   Labs (all labs ordered are listed, but only abnormal results are displayed) Labs Reviewed  COMPREHENSIVE METABOLIC PANEL - Abnormal; Notable for the following components:      Result Value   Calcium 8.5 (*)    Albumin 3.2 (*)    AST 12 (*)    All other components within normal limits  CBC WITH DIFFERENTIAL/PLATELET - Abnormal; Notable for the following components:   Hemoglobin 11.3 (*)    HCT 35.4 (*)    All other components within normal limits   URINALYSIS, ROUTINE W REFLEX MICROSCOPIC - Abnormal; Notable for the following components:   Color, Urine STRAW (*)    All other components within normal limits  LIPASE, BLOOD  I-STAT BETA HCG BLOOD, ED ([facility], [facility], [facility] ONLY)    EKG None  Radiology CT ABDOMEN PELVIS W CONTRAST  Result Date: 10/21/2021 CLINICAL DATA:  Right lower quadrant pain occurring intermittently for multiple years. EXAM: CT ABDOMEN AND PELVIS WITH CONTRAST TECHNIQUE: Multidetector CT imaging of the abdomen and pelvis was performed using the standard protocol following bolus administration of intravenous contrast. RADIATION DOSE REDUCTION: This exam was performed according to the departmental dose-optimization program which includes automated exposure control, adjustment of the mA and/or kV according to patient size and/or use of iterative reconstruction technique. CONTRAST:  OMNIPAQUE IOHEXOL 300 MG/ML  SOLN COMPARISON:  Pelvic ultrasound 11/30/2020. MRI abdomen and pelvis 11/26/2017. FINDINGS: Assessment is limited by poor contrast enhancement. Lower chest: Minimal dependent atelectasis in the lung bases. No pleural effusion. Hepatobiliary: Enlarged liver measuring 21 cm in craniocaudal length. No significant biliary dilatation status post cholecystectomy. Pancreas: Unremarkable. Spleen: Unremarkable. Adrenals/Urinary Tract: Unremarkable adrenal glands. No evidence of a renal mass, calculi, or hydronephrosis. Unremarkable bladder. Stomach/Bowel: The stomach is unremarkable. Mildly prominent gas and small volume fluid are present in multiple small bowel loops in the central abdomen. No frankly dilated loops of bowel are seen to indicate obstruction. A small to moderate amount of stool is present in the colon. The appendix is unremarkable. Vascular/Lymphatic: Normal caliber of the abdominal aorta. No enlarged lymph nodes. Reproductive: Uterus and bilateral adnexa are grossly unremarkable. Other: Trace pelvic free fluid which may  be physiologic. No pneumoperitoneum. Small midline supraumbilical hernia containing fat and a small amount of fluid. Musculoskeletal: No acute osseous abnormality or suspicious osseous lesion. IMPRESSION: 1. No acute abnormality identified in the abdomen or pelvis. 2. Small supraumbilical hernia containing fat and a small amount of fluid. 3. Hepatomegaly. Electronically Signed   By: Sebastian Ache M.D.   On: 10/21/2021 13:06    Procedures Procedures   Medications Ordered in ED Medications  morphine (PF) 2 MG/ML injection 2 mg (has no administration in time range)  amLODipine (NORVASC) tablet 5 mg (has no administration in time range)  cloNIDine (CATAPRES) tablet 0.2 mg (has no administration in time range)  morphine (PF) 4 MG/ML injection 4 mg (4 mg Intravenous Given 10/21/21 1125)  ondansetron (ZOFRAN) injection 4 mg (4 mg Intravenous Given 10/21/21 1124)  sodium chloride 0.9 % bolus 500 mL (0 mLs Intravenous Stopped 10/21/21 1316)  iohexol (OMNIPAQUE) 300 MG/ML solution 100 mL (100 mLs Intravenous Contrast Given 10/21/21  1254)    ED Course/ Medical Decision Making/ A&P                           Medical Decision Making Amount and/or Complexity of Data Reviewed Labs: ordered. Radiology: ordered.  Risk Prescription drug management.   This patient presents to the ED for concern of abdominal pain.  This appears to be acute on chronic.  Differential includes but is not limited to appendicitis,, colitis, IBS, IBD, CRC, diverticulitis, enteritis, gastroparesis, pancreatitis and mesenteric ischemia.    This is not an exhaustive differential.    Past Medical History / Co-morbidities / Social History: Chronic abdominal pain   Physical Exam: Pertinent physical exam findings include severe tenderness to right lower quadrant  Lab Tests: I ordered, and personally interpreted labs.  The pertinent results include:  -normal WBC -Mild hypocalcemia to 8.5.  Doubt this is causing her symptoms.    Imaging Studies: I ordered and independently visualized and interpreted CT abdomen pelvis which showed a small suprapubic hernia. I agree with the radiologist interpretation.   Medications: I ordered medication including fluids, Zofran and morphine. Reevaluation of the patient after these medicines showed that the patient improved. I have reviewed the patients home medicines and have made adjustments as needed.  Also given home antihypertensives due to elevated blood pressures since she missed her doses today.  Bradycardia seems to be chronic and she is asymptomatic of this.  Disposition: After consideration of the diagnostic results and the patients response to treatment, I feel that patient is stable for discharge home.  She has had 3 years of chronic abdominal pain and has never been seen by a gastroenterologist.  She needs to follow-up with a GI office.  She has been given referral to [facility] and [facility].  She is agreeable to discharge at this time.  Final Clinical Impression(s) / ED Diagnoses Final diagnoses:  Chronic abdominal pain    Rx / DC Orders ED Discharge Orders     None      Results and diagnoses were explained to the patient. Return precautions discussed in full. Patient had no additional questions and expressed complete understanding.   This chart was dictated using voice recognition software.  Despite best efforts to proofread,  errors can occur which can change the documentation meaning.    Woodroe Chen 10/21/21 1339    Ernie Avena, MD 10/21/21 [phone]

## 2021-10-25 ENCOUNTER — Encounter: Payer: Self-pay | Admitting: Nurse Practitioner

## 2021-10-26 ENCOUNTER — Ambulatory Visit: Payer: Medicaid Other | Admitting: Internal Medicine

## 2021-11-04 ENCOUNTER — Ambulatory Visit: Payer: Medicaid Other | Admitting: Student

## 2021-11-04 ENCOUNTER — Encounter: Payer: Self-pay | Admitting: Student

## 2021-11-04 VITALS — BP 167/99 | HR 79 | Temp 98.1°F | Ht 71.54 in | Wt 323.6 lb

## 2021-11-04 DIAGNOSIS — Z6841 Body Mass Index (BMI) 40.0 and over, adult: Secondary | ICD-10-CM

## 2021-11-04 DIAGNOSIS — Z720 Tobacco use: Secondary | ICD-10-CM | POA: Diagnosis not present

## 2021-11-04 DIAGNOSIS — R160 Hepatomegaly, not elsewhere classified: Secondary | ICD-10-CM | POA: Diagnosis not present

## 2021-11-04 DIAGNOSIS — Z7689 Persons encountering health services in other specified circumstances: Secondary | ICD-10-CM | POA: Diagnosis present

## 2021-11-04 DIAGNOSIS — I10 Essential (primary) hypertension: Secondary | ICD-10-CM

## 2021-11-04 MED ORDER — LOSARTAN POTASSIUM-HCTZ 50-12.5 MG PO TABS: 1.0000 | ORAL_TABLET | Freq: Every day | ORAL | 0 refills | Status: DC

## 2021-11-04 NOTE — Assessment & Plan Note (Signed)
Uncontrolled.  Has been on suboptimal regimen and has had issues with tolerance of clonidine.  -We will discontinue clonidine -Add losartan-HCTZ 50-12.5 mg daily in place of HCTZ -Continue amlodipine 10 mg daily -Lab visit in 1 week to check chemistry -Follow-up with me in 2-4 weeks

## 2021-11-04 NOTE — Progress Notes (Signed)
    SUBJECTIVE:   CHIEF COMPLAINT / HPI:   New Patient  Patient presents today to establish with me as his PCP.  Per chart review, has a history of hypertension and chronic abdominal pain.   Medications include amlodipine, HCTZ, Clonidine.  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 Cardiovascular:     Rate and Rhythm: Normal rate and regular rhythm.     Pulses: Normal pulses.     Heart sounds: Normal heart sounds.  Pulmonary:     Effort: Pulmonary effort is normal.     Breath sounds: Normal breath sounds.  Abdominal:     General: There is no distension.     Tenderness: There is no abdominal tenderness.     Comments: Palpable liver edge just below the costal margin with palpable pulse  Neurological:     Mental Status: She is alert.      ASSESSMENT/PLAN:   Primary hypertension Uncontrolled.  Has been on suboptimal regimen and has had issues with tolerance of clonidine.  -We will discontinue clonidine -Add losartan-HCTZ 50-12.5 mg daily in place of HCTZ -Continue amlodipine 10 mg daily -Lab visit in 1 week to check chemistry -Follow-up with me in 2-4 weeks  Tobacco use Interested in quitting.  Will return for smoking cessation  dedicated visit  Hepatomegaly Noted on recent CT scan.  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  Suspect that this is the cause for her right upper quadrant abdominal pain as well secondary to capsular distention.  Does have follow-up with GI already scheduled next week.  Will defer to them on what further work-up is needed.  Lab work suggestive that liver function remains intact.  BMI 40.0-44.9, adult [facility]) Has been making weight loss efforts.  Having success.  Interested in medications to assist.  Her chronic abdominal pain is not an absolute contraindication to GLP-1 agonist but could cloud the picture given the side effect profile of these medications, will engage in a more thorough risk-benefit discussion at follow-up. -We will discuss at follow-up visit     [name], [place]

## 2021-11-04 NOTE — Assessment & Plan Note (Signed)
Noted on recent CT scan.  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  Suspect that this is the cause for her right upper quadrant abdominal pain as well secondary to capsular distention.  Does have follow-up with GI already scheduled next week.  Will defer to them on what further work-up is needed.  Lab work suggestive that liver function remains intact.

## 2021-11-04 NOTE — Assessment & Plan Note (Signed)
Interested in quitting.  Will return for smoking cessation dedicated visit

## 2021-11-04 NOTE — Assessment & Plan Note (Addendum)
Has been making weight loss efforts.  Having success.  Interested in medications to assist.  Her chronic abdominal pain is not an absolute contraindication to GLP-1 agonist but could cloud the picture given the side effect profile of these medications, will engage in a more thorough risk-benefit discussion at follow-up. -We will discuss at follow-up visit

## 2021-11-04 NOTE — Patient Instructions (Addendum)
Tabitha Holland,  It is so good to see you!  I am going to enjoy being your PCP. We need to work on your blood pressure. I'm going to stop your clonidine and HCTZ and start you on combo med that contains Losartan and HCTZ together. Please come back next Friday for a lab visit and make an appointment to see me within the next four or so weeks. Continue taking the amlodipine. Be sure to go to your GI appt as this will be in the best interest of your liver health.   [name], MD

## 2021-11-07 ENCOUNTER — Ambulatory Visit (INDEPENDENT_AMBULATORY_CARE_PROVIDER_SITE_OTHER): Payer: Medicaid Other

## 2021-11-07 ENCOUNTER — Encounter (HOSPITAL_COMMUNITY): Payer: Self-pay

## 2021-11-07 ENCOUNTER — Ambulatory Visit (HOSPITAL_COMMUNITY)
Admission: EM | Admit: 2021-11-07 | Discharge: 2021-11-07 | Disposition: A | Discharge status: Home / Self Care | Payer: Medicaid Other | Attending: Physician Assistant | Admitting: Physician Assistant

## 2021-11-07 DIAGNOSIS — R350 Frequency of micturition: Secondary | ICD-10-CM

## 2021-11-07 DIAGNOSIS — G4486 Cervicogenic headache: Secondary | ICD-10-CM

## 2021-11-07 DIAGNOSIS — M542 Cervicalgia: Secondary | ICD-10-CM

## 2021-11-07 LAB — POCT URINALYSIS DIPSTICK, ED / UC
Bilirubin Urine: NEGATIVE
Glucose, UA: NEGATIVE mg/dL
Ketones, ur: NEGATIVE mg/dL
Leukocytes,Ua: NEGATIVE
Nitrite: NEGATIVE
Protein, ur: NEGATIVE mg/dL
Specific Gravity, Urine: 1.02 (ref 1.005–1.030)
Urobilinogen, UA: 0.2 mg/dL (ref 0.0–1.0)
pH: 5.5 (ref 5.0–8.0)

## 2021-11-07 LAB — POC URINE PREG, ED: Preg Test, Ur: NEGATIVE

## 2021-11-07 MED ORDER — KETOROLAC TROMETHAMINE 30 MG/ML IJ SOLN
30.0000 mg | Freq: Once | INTRAMUSCULAR | Status: AC
  Administered 2021-11-07: 30 mg via INTRAMUSCULAR

## 2021-11-07 MED ORDER — BACLOFEN 10 MG PO TABS: 10.0000 mg | ORAL_TABLET | Freq: Two times a day (BID) | ORAL | 0 refills | Status: DC | PRN

## 2021-11-07 MED ORDER — KETOROLAC TROMETHAMINE 30 MG/ML IJ SOLN
INTRAMUSCULAR | Status: AC
  Filled 2021-11-07: qty 1

## 2021-11-07 NOTE — ED Triage Notes (Signed)
Pt presents with c/o migraine and neck pain that began this morning, hematuria that occurred this morning, and urinary frequency.

## 2021-11-07 NOTE — ED Provider Notes (Signed)
[place]    CSN: [id number] Arrival date & time: 11/07/21  1113      History   Chief Complaint Chief Complaint  Patient presents with   Migraine   Neck Pain   Hematuria    HPI Tabitha Holland is a 36 y.o. female.   Patient presents today with several concerns.  Her primary concern today is persistent headache and neck pain.  Reports she has had an ongoing headache for the past 3 weeks that has varied in intensity.  She reports intermittent blurred vision which has been occurring for the past several months when she gets a headache but denies any changes to her visual field.  She has tried Tylenol and Excedrin without improvement of symptoms.  She does not take NSAIDs regularly due to history of ulcers.  She denies any focal weakness, dysarthria, nausea, vomiting, dizziness, syncope.  She reports her last menstrual cycle was within the past few days and wonders if this could have triggered symptoms.  She denies any medication changes.  She has previously been told that her migraines are related to hypertension.  She is currently working with her PCP to improve her blood pressure and adjust her medications.  She has not seen a neurologist or taken migraine prophylaxis in the past.  She reports that pain is traveling into her neck and now involving both sides of her neck.  She is having difficulty with daily activities.  Reports pain is rated 7 on a 0-10 pain scale, localized to frontal head with radiation into her neck, described as throbbing, no aggravating relieving factors identified.  She denies any recent illness or additional symptoms including fever.  Denies any recent head injury.  In addition, patient reports a several day history of urinary frequency.  She also reports a significant blood clot this morning with her first month duration.  She is unsure if this was vaginal or related to urination.  Reports that her LMP ended a few days ago and she does not typically have  clots.  Denies any pelvic pain or significant abdominal pain increased from baseline; has chronic abdominal pain which has been attributed to hepatomegaly which is being followed by her PCP.  She denies history of single kidney, recent urogenital procedure, self catheterization.  Denies any recent antibiotic use.  Denies any pelvic pain or vaginal discharge.    Past Medical History:  Diagnosis Date   Anxiety    Depression    History of anemia    per pt on 02/02/2021   History of suicidal ideation 10/02/2019   Hx of wisdom tooth extraction    Hypertension    Migraines    occasional per pt 02/02/2021   Morbid obesity [facility])     Patient Active Problem List   Diagnosis Date Noted   Hepatomegaly 11/04/2021   Encounter to establish care with new doctor 11/04/2021   Submucous uterine fibroid 02/02/2021   Abnormal uterine bleeding 11/22/2020   Pelvic pain 11/22/2020   Previous cesarean section 05/26/2018   BMI 40.0-44.9, adult ([place]) 05/26/2018   Marijuana abuse 05/26/2018   Tobacco use 05/12/2013   Primary hypertension 05/12/2013    Past Surgical History:  Procedure Laterality Date   CESAREAN SECTION N/A 10/28/2013   Procedure: CESAREAN SECTION;  Surgeon: Emily Filbert, MD;  Location: [facility];  Service: Obstetrics;  Laterality: N/A;   CESAREAN SECTION N/A 09/15/2015   Procedure: CESAREAN SECTION;  Surgeon: Mora Bellman, MD;  Location: [place];  Service: Obstetrics;  Laterality: N/A;   CESAREAN SECTION N/A 05/26/2018   Procedure: CESAREAN SECTION;  Surgeon: Woodroe Mode, MD;  Location: [place];  Service: Obstetrics;  Laterality: N/A;   CHOLECYSTECTOMY     DILATATION & CURETTAGE/HYSTEROSCOPY WITH MYOSURE N/A 02/09/2021   Procedure: DILATATION & CURETTAGE/HYSTEROSCOPY WITH MYOSURE;  Surgeon: [name], MD;  Location: [place];  Service: Gynecology;  Laterality: N/A;  fibroid resection   WISDOM TOOTH EXTRACTION      OB History     Gravida   5   Para  5   Term  5   Preterm  0   AB  0   Living  5      SAB  0   IAB  0   Ectopic  0   Multiple  0   Live Births  5            Home Medications    Prior to Admission medications   Medication Sig Start Date End Date Taking? Authorizing Provider  baclofen (LIORESAL) 10 MG tablet Take 1 tablet (10 mg total) by mouth 2 (two) times daily as needed for muscle spasms. 11/07/21  Yes Elford Evilsizer K, PA-C  amLODipine (NORVASC) 10 MG tablet Take 1 tablet (10 mg total) by mouth daily. 08/03/20   Kerin Perna, NP  losartan-hydrochlorothiazide (HYZAAR) 50-12.5 MG tablet Take 1 tablet by mouth daily. 11/04/21   Eppie Gibson, MD  neomycin-polymyxin-hydrocortisone (CORTISPORIN) 3.5-10000-1 OTIC suspension Place 4 drops into the left ear 3 (three) times daily. Patient not taking: Reported on 10/21/2021 09/28/21   Vanessa Kick, MD  Ruxolitinib Phosphate (OPZELURA) 1.5 % CREA Apply 1 Application topically daily as needed (eczema).    [provider]    Family History Family History  Problem Relation Age of Onset   Hypertension Sister     Social History Social History   Tobacco Use   Smoking status: Some Days    Packs/day: 0.25    Types: Cigarettes   Smokeless tobacco: Never  Vaping Use   Vaping Use: Never used  Substance Use Topics   Alcohol use: Yes    Comment: occasional   Drug use: Yes    Types: Marijuana     Allergies   Codeine, Fish allergy, Hydrocodone, Latex, Percocet [oxycodone-acetaminophen], and Shellfish allergy   Review of Systems Review of Systems  Constitutional:  Positive for activity change. Negative for appetite change, fatigue and fever.  Eyes:  Positive for visual disturbance (Blurred).  Respiratory:  Negative for cough and shortness of breath.   Cardiovascular:  Negative for chest pain.  Gastrointestinal:  Positive for abdominal pain. Negative for diarrhea, nausea and vomiting.  Genitourinary:  Positive for frequency and  hematuria. Negative for dysuria, pelvic pain, urgency, vaginal bleeding, vaginal discharge and vaginal pain.  Musculoskeletal:  Positive for back pain and neck pain. Negative for arthralgias and myalgias.  Neurological:  Positive for headaches. Negative for dizziness, syncope, facial asymmetry, speech difficulty, weakness, light-headedness and numbness.     Physical Exam Triage Vital Signs ED Triage Vitals  Enc Vitals Group     BP 11/07/21 1250 (!) 156/100     Pulse Rate 11/07/21 1250 61     Resp 11/07/21 1250 16     Temp 11/07/21 1250 97.9 F (36.6 C)     Temp Source 11/07/21 1250 Oral     SpO2 11/07/21 1250 100 %     Weight --  Height --      Head Circumference --      Peak Flow --      Pain Score 11/07/21 1249 6     Pain Loc --      Pain Edu? --      Excl. in [place]? --    No data found.  Updated Vital Signs BP (!) 156/100 (BP Location: Left Arm)   Pulse 61   Temp 97.9 F (36.6 C) (Oral)   Resp 16   SpO2 100%   Visual Acuity Right Eye Distance: 20/40 Left Eye Distance: 20/50 Bilateral Distance: 20/40  Right Eye Near:   Left Eye Near:    Bilateral Near:     Physical Exam Vitals reviewed.  Constitutional:      General: She is awake. She is not in acute distress.    Appearance: Normal appearance. She is well-developed. She is not ill-appearing.     Comments: Very pleasant female appears stated age in no acute distress sitting comfortably in exam room  HENT:     Head: Normocephalic and atraumatic. No raccoon eyes, Battle's sign or contusion.     Right Ear: External ear normal.     Left Ear: External ear normal.     Nose: Nose normal.     Mouth/Throat:     Tongue: Tongue does not deviate from midline.     Pharynx: Uvula midline. No oropharyngeal exudate or posterior oropharyngeal erythema.  Eyes:     Extraocular Movements: Extraocular movements intact.  Cardiovascular:     Rate and Rhythm: Normal rate and regular rhythm.     Heart sounds: Normal heart  sounds, S1 normal and S2 normal. No murmur heard. Pulmonary:     Effort: Pulmonary effort is normal.     Breath sounds: Normal breath sounds. No wheezing, rhonchi or rales.     Comments: Clear to auscultation bilaterally Abdominal:     General: Bowel sounds are normal.     Palpations: Abdomen is soft.     Tenderness: There is no abdominal tenderness. There is no right CVA tenderness, left CVA tenderness, guarding or rebound.  Musculoskeletal:     Cervical back: Neck supple. Spasms, tenderness and bony tenderness present. Spinous process tenderness and muscular tenderness present. Decreased range of motion.     Thoracic back: Tenderness present. No bony tenderness.     Lumbar back: Tenderness present. No bony tenderness. Negative right straight leg raise test and negative left straight leg raise test.     Comments: Strength 5/5 bilateral upper and lower extremities  Lymphadenopathy:     Head:     Right side of head: No submental, submandibular or tonsillar adenopathy.     Left side of head: No submental, submandibular or tonsillar adenopathy.  Neurological:     General: No focal deficit present.     Cranial Nerves: Cranial nerves 2-12 are intact.     Motor: Motor function is intact.     Coordination: Coordination is intact.     Gait: Gait is intact.     Comments: Cranial nerves II through XII grossly intact.  Psychiatric:        Behavior: Behavior is cooperative.      UC Treatments / Results  Labs (all labs ordered are listed, but only abnormal results are displayed) Labs Reviewed  POCT URINALYSIS DIPSTICK, ED / UC - Abnormal; Notable for the following components:      Result Value   Hgb urine dipstick LARGE (*)  All other components within normal limits  URINE CULTURE  POC URINE PREG, ED    EKG   Radiology DG Cervical Spine Complete  Result Date: 11/07/2021 CLINICAL DATA:  Pain over the vertebrae, migraines, neck pain EXAM: CERVICAL SPINE - COMPLETE 4+ VIEW  COMPARISON:  08/27/2011 FINDINGS: Cervical spine is visualized to the level of C7-T1. Vertebral body heights are maintained. No static listhesis. Loss of the normal cervical lordosis with straightening. Prevertebral soft tissues are normal. No acute fracture. Disc spaces are maintained. No foraminal stenosis. IMPRESSION: Negative cervical spine radiographs. Electronically Signed   By: Kathreen Devoid M.D.   On: 11/07/2021 14:35    Procedures Procedures (including critical care time)  Medications Ordered in UC Medications  ketorolac (TORADOL) 30 MG/ML injection 30 mg (30 mg Intramuscular Given 11/07/21 1340)    Initial Impression / Assessment and Plan / UC Course  I have reviewed the triage vital signs and the nursing notes.  Pertinent labs & imaging results that were available during my care of the patient were reviewed by me and considered in my medical decision making (see chart for details).     Patient is well-appearing, afebrile, nontoxic, nontachycardic.  Vital signs and physical exam reassuring today; no indication for emergent evaluation or imaging.  Patient has significant tenderness palpation over paraspinal muscles and along cervical vertebrae.  X-ray was obtained that showed no osseous abnormality.  Discussed this is likely contributing to ongoing headache.  She was given Toradol in clinic if she is unable to take NSAIDs orally due to history of peptic ulcer disease.  This did provide some relief of symptoms.  She can continue over-the-counter medications as needed for additional symptom relief.  We will start baclofen with instruction not to drive or drink alcohol taking this medication as drowsiness is a common side effect.  Recommended conservative measures including rest and heat.  If her symptoms persist she is to follow-up with neurology was given contact information for local provider with instruction to call to schedule an appointment.  Discussed that if she has any worsening  symptoms including severe headache, nausea/vomiting after the oral intake, weakness, visual disturbance, dysarthria she is to go to the emergency room.  Strict return precautions given.  Work excuse note provided.  UA was obtained that showed hemoglobin but was otherwise normal.  Suspect this is related to recent menstrual bleeding.  We will send this off for culture prefer antibiotics until results are available.  She was encouraged to drink plenty of fluid.  Discussed that she should have her urine rechecked with her primary care provider within a few weeks to ensure that hemoglobin noted in urine resolves.  Discussed alarm symptoms that warrant emergent evaluation.  Strict return precautions given.  Work excuse note provided.  Final Clinical Impressions(s) / UC Diagnoses   Final diagnoses:  Cervicogenic headache  Neck pain  Urinary frequency     Discharge Instructions      Your x-rays were normal.  Take baclofen to help with muscle spasm and pain.  This make you sleepy do not drive or drink alcohol with taking it.  You can use heat for additional symptom relief.  I do recommend that you follow-up with specialist given your recurrent headaches.  Call them to schedule appointment.  If you have any worsening symptoms including weakness, visual disturbance, severe headache, nausea/vomiting interfere with oral intake you need to go to the emergency room.  Your urine had blood was otherwise normal.  I will send  this off for culture but do not think we need to start antibiotics.  Make sure you are drinking plenty of fluid.  If anything worsens please return for reevaluation.     ED Prescriptions     Medication Sig Dispense Auth. Provider   baclofen (LIORESAL) 10 MG tablet Take 1 tablet (10 mg total) by mouth 2 (two) times daily as needed for muscle spasms. 30 each Erhardt Dada, Derry Skill, PA-C      PDMP not reviewed this encounter.   Terrilee Croak, PA-C 11/07/21 1448

## 2021-11-07 NOTE — Discharge Instructions (Signed)
Your x-rays were normal.  Take baclofen to help with muscle spasm and pain.  This make you sleepy do not drive or drink alcohol with taking it.  You can use heat for additional symptom relief.  I do recommend that you follow-up with specialist given your recurrent headaches.  Call them to schedule appointment.  If you have any worsening symptoms including weakness, visual disturbance, severe headache, nausea/vomiting interfere with oral intake you need to go to the emergency room.  Your urine had blood was otherwise normal.  I will send this off for culture but do not think we need to start antibiotics.  Make sure you are drinking plenty of fluid.  If anything worsens please return for reevaluation.

## 2021-11-08 LAB — URINE CULTURE: Culture: 40000 — AB

## 2021-11-11 ENCOUNTER — Other Ambulatory Visit: Payer: Medicaid Other

## 2021-11-11 DIAGNOSIS — I10 Essential (primary) hypertension: Secondary | ICD-10-CM

## 2021-11-12 LAB — BASIC METABOLIC PANEL
BUN/Creatinine Ratio: 9 (ref 9–23)
BUN: 6 mg/dL (ref 6–20)
CO2: 24 mmol/L (ref 20–29)
Calcium: 8.7 mg/dL (ref 8.7–10.2)
Chloride: 102 mmol/L (ref 96–106)
Creatinine, Ser: 0.65 mg/dL (ref 0.57–1.00)
Glucose: 92 mg/dL (ref 70–99)
Potassium: 3.9 mmol/L (ref 3.5–5.2)
Sodium: 138 mmol/L (ref 134–144)
eGFR: 118 mL/min/{1.73_m2} (ref >59)

## 2021-11-18 ENCOUNTER — Encounter: Payer: Self-pay | Admitting: Nurse Practitioner

## 2021-11-18 ENCOUNTER — Ambulatory Visit (INDEPENDENT_AMBULATORY_CARE_PROVIDER_SITE_OTHER): Payer: Medicaid Other | Admitting: Nurse Practitioner

## 2021-11-18 ENCOUNTER — Other Ambulatory Visit (INDEPENDENT_AMBULATORY_CARE_PROVIDER_SITE_OTHER): Payer: Medicaid Other

## 2021-11-18 VITALS — BP 140/100 | HR 72 | Ht 72.0 in | Wt 325.4 lb

## 2021-11-18 DIAGNOSIS — R160 Hepatomegaly, not elsewhere classified: Secondary | ICD-10-CM

## 2021-11-18 DIAGNOSIS — R1010 Upper abdominal pain, unspecified: Secondary | ICD-10-CM | POA: Diagnosis not present

## 2021-11-18 DIAGNOSIS — G8929 Other chronic pain: Secondary | ICD-10-CM | POA: Diagnosis not present

## 2021-11-18 LAB — PROTIME-INR
INR: 1 ratio (ref 0.8–1.0)
Prothrombin Time: 11.2 s (ref 9.6–13.1)

## 2021-11-18 NOTE — Progress Notes (Signed)
Chief Complaint:  abdominal pain, hernia   Assessment & Plan    36 yo female with chronic LUQ pain related only to movement involving the area.  Pain seems to be musculoskeletal.  She is very clear that the pain only occurs with stretching, coughing, or bearing down as during defecation.  She has intermittent nausea but this has been present for 14 years and she really is not concerned about it.  Recent CT scan in the ED unrevealing except for small supra-umbilical hernia and mildly prominent gas and small volume fluid in multiple small bowel loops in the central abdomen.  Suspect small bowel findings are incidental as is the hepatomegaly ( 21 cm) which was also seen, see below  Chronic, intermittent RLQ discomfort. which feels like something is moving across her lower abdomen. No RLQ findings on CT scan including evaluation of appendix. She has occasional constipation and also occasional loose stool.  Query is pain if functional? Of note, MRI in July 2019 for RLQ in setting of pregnancy was basically unrevealing.   She asked about the hernia seen on CT scan.  Explained this was a small hernia just above the umbilicus and it did not appear to be causing any problems No further GI work-up needed at this point  Hepatomegaly ( incidental finding on recent CT scan) . Non-tender. Not cholestatic, liver enzymes normal. Of note, MRI in July 2019 without liver abnormalities  so this is interval development. She is at risk for fatty liver though steatosis not reported on scan. Infiltrative process possible but difficult to know as there was no description of the liver parenchyma given in CT report.  Check for chronic viral hepatitis, check INR Additional imaging needed such as doppler study? Will discuss with Dr. Hilarie Fredrickson.    Chronic [place] anemia dating back to at least 2008. Hgb is at baseline >> 11.3.   Borderline cardiomegaly.    Morbid Obesity  HPI:     Patient is a 36 year old female with a  past medical history of hypertension, tobacco use, obesity, uterine fibroids , C-section , cholecystectomy , suicidal ideation, anxiety, depression and chronic abdominal pain.   Patient is self-referred. She was recently seen in ED for abdominal pain.  for evaluation of abdominal pain.  She gives a 3 year history of the same abdominal pain   ED 10/21/21 ED visit for evaluation of abdominal pain. She points to her LUQ area and says this is where the pain is always located. The pain is intermittent. She cannot correlate the pain to eating.She says the pain is ONLY related to coughing, stretching or bearing down such as when she defecates. Pain feels like needles and often radiates across abdomen toward RUQ. She also has intermittent RLQ  which feels like something is moving across her lower abdomen. She says the ED told her she needed " a scope" because she has a hernia. I reviewed the CT scan, she has a small midline supraumbilical hernia.   ED workup Lipase 29, albumin 3.2 liver chemistries otherwise normal.  Hemoglobin 11.3 (at baseline).  CT scan showed enlarged liver and mildly prominent gas and small volume fluid are present in multiple small bowel loops in the central abdomen.   11/07/21 preg test negative.  Urine culture - 40,000 COLONIES/mL STREPTOCOCCUS AGALACTIAE  She has had occasional bouts of nausea for 14 years. She has occasional constipation. No other GI complaints. No blood in stool. She is adopted and doesn't know her family history.  Previous Labs / Imaging::  EXAM: CT ABDOMEN AND PELVIS WITH CONTRAST   TECHNIQUE: Multidetector CT imaging of the abdomen and pelvis was performed using the standard protocol following bolus administration of intravenous contrast.   RADIATION DOSE REDUCTION: This exam was performed according to the departmental dose-optimization program which includes automated exposure control, adjustment of the mA and/or kV according to patient size  and/or use of iterative reconstruction technique.   CONTRAST:  131m OMNIPAQUE IOHEXOL 300 MG/ML  SOLN   COMPARISON:  Pelvic ultrasound 11/30/2020. MRI abdomen and pelvis 11/26/2017.   FINDINGS: Assessment is limited by poor contrast enhancement.   Lower chest: Minimal dependent atelectasis in the lung bases. No pleural effusion.   Hepatobiliary: Enlarged liver measuring 21 cm in craniocaudal length. No significant biliary dilatation status post cholecystectomy.   Pancreas: Unremarkable.   Spleen: Unremarkable.   Adrenals/Urinary Tract: Unremarkable adrenal glands. No evidence of a renal mass, calculi, or hydronephrosis. Unremarkable bladder.   Stomach/Bowel: The stomach is unremarkable. Mildly prominent gas and small volume fluid are present in multiple small bowel loops in the central abdomen. No frankly dilated loops of bowel are seen to indicate obstruction. A small to moderate amount of stool is present in the colon. The appendix is unremarkable.   Vascular/Lymphatic: Normal caliber of the abdominal aorta. No enlarged lymph nodes.   Reproductive: Uterus and bilateral adnexa are grossly unremarkable.   Other: Trace pelvic free fluid which may be physiologic. No pneumoperitoneum. Small midline supraumbilical hernia containing fat and a small amount of fluid.   Musculoskeletal: No acute osseous abnormality or suspicious osseous lesion.   IMPRESSION: 1. No acute abnormality identified in the abdomen or pelvis. 2. Small supraumbilical hernia containing fat and a small amount of fluid. 3. Hepatomegaly.     Electronically Signed   By: ALogan BoresM.D.   On: 10/21/2021 13:06         Latest Ref Rng & Units 10/21/2021   10:39 AM 07/22/2021   11:28 AM 02/09/2021   11:40 AM  CBC  WBC 4.0 - 10.5 K/uL 7.4  7.9    Hemoglobin 12.0 - 15.0 g/dL 11.3  11.7  12.2   Hematocrit 36.0 - 46.0 % 35.4  35.6  36.0   Platelets 150 - 400 K/uL 199  162      Lab Results   Component Value Date   LIPASE 29 10/21/2021      Latest Ref Rng & Units 11/11/2021    9:45 AM 10/21/2021   10:39 AM 07/22/2021   12:06 PM  CMP  Glucose 70 - 99 mg/dL 92  88  80   BUN 6 - 20 mg/dL '6  12  11   '$ Creatinine 0.57 - 1.00 mg/dL 0.65  0.77  0.75   Sodium 134 - 144 mmol/L 138  140  138   Potassium 3.5 - 5.2 mmol/L 3.9  4.1  4.1   Chloride 96 - 106 mmol/L 102  107  105   CO2 20 - 29 mmol/L '24  26  27   '$ Calcium 8.7 - 10.2 mg/dL 8.7  8.5  9.0   Total Protein 6.5 - 8.1 g/dL  6.5    Total Bilirubin 0.3 - 1.2 mg/dL  0.5    Alkaline Phos 38 - 126 U/L  71    AST 15 - 41 U/L  12    ALT 0 - 44 U/L  10      Previous GI Evaluation   none  Imaging:  10/21/21 CT AP with contrast Lower chest: Minimal dependent atelectasis in the lung bases. No pleural effusion.   Hepatobiliary: Enlarged liver measuring 21 cm in craniocaudal length. No significant biliary dilatation status post cholecystectomy.   Pancreas: Unremarkable.   Spleen: Unremarkable.   Adrenals/Urinary Tract: Unremarkable adrenal glands. No evidence of a renal mass, calculi, or hydronephrosis. Unremarkable bladder.   Stomach/Bowel: The stomach is unremarkable. Mildly prominent gas and small volume fluid are present in multiple small bowel loops in the central abdomen. No frankly dilated loops of bowel are seen to indicate obstruction. A small to moderate amount of stool is present in the colon. The appendix is unremarkable.   Vascular/Lymphatic: Normal caliber of the abdominal aorta. No enlarged lymph nodes.   Reproductive: Uterus and bilateral adnexa are grossly unremarkable.   Other: Trace pelvic free fluid which may be physiologic. No pneumoperitoneum. Small midline supraumbilical hernia containing fat and a small amount of fluid.   Musculoskeletal: No acute osseous abnormality or suspicious osseous lesion.   IMPRESSION: 1. No acute abnormality identified in the abdomen or pelvis. 2. Small  supraumbilical hernia containing fat and a small amount of fluid. 3. Hepatomegaly.     Past Medical History:  Diagnosis Date   Anxiety    Depression    History of anemia    per pt on 02/02/2021   History of suicidal ideation 10/02/2019   Hx of wisdom tooth extraction    Hypertension    Migraines    occasional per pt 02/02/2021   Morbid obesity ([place])    Past Surgical History:  Procedure Laterality Date   CESAREAN SECTION N/A 10/28/2013   Procedure: CESAREAN SECTION;  Surgeon: Emily Filbert, MD;  Location: [facility];  Service: Obstetrics;  Laterality: N/A;   CESAREAN SECTION N/A 09/15/2015   Procedure: CESAREAN SECTION;  Surgeon: Mora Bellman, MD;  Location: [facility];  Service: Obstetrics;  Laterality: N/A;   CESAREAN SECTION N/A 05/26/2018   Procedure: CESAREAN SECTION;  Surgeon: Woodroe Mode, MD;  Location: [facility];  Service: Obstetrics;  Laterality: N/A;   CHOLECYSTECTOMY     DILATATION & CURETTAGE/HYSTEROSCOPY WITH MYOSURE N/A 02/09/2021   Procedure: DILATATION & CURETTAGE/HYSTEROSCOPY WITH MYOSURE;  Surgeon: [name], MD;  Location: [facility];  Service: Gynecology;  Laterality: N/A;  fibroid resection   WISDOM TOOTH EXTRACTION     Family History  Problem Relation Age of Onset   Hypertension Sister    Social History   Tobacco Use   Smoking status: Some Days    Packs/day: 0.25    Types: Cigarettes   Smokeless tobacco: Never  Vaping Use   Vaping Use: Never used  Substance Use Topics   Alcohol use: Yes    Comment: occasional   Drug use: Yes    Types: Marijuana   Current Outpatient Medications  Medication Sig Dispense Refill   amLODipine (NORVASC) 10 MG tablet Take 1 tablet (10 mg total) by mouth daily. 90 tablet 3   baclofen (LIORESAL) 10 MG tablet Take 1 tablet (10 mg total) by mouth 2 (two) times daily as needed for muscle spasms. 30 each 0   losartan-hydrochlorothiazide (HYZAAR) 50-12.5 MG tablet Take 1 tablet by mouth  daily. 30 tablet 0   neomycin-polymyxin-hydrocortisone (CORTISPORIN) 3.5-10000-1 OTIC suspension Place 4 drops into the left ear 3 (three) times daily. (Patient not taking: Reported on 10/21/2021) 10 mL 0   Ruxolitinib Phosphate (OPZELURA) 1.5 % CREA Apply 1 Application topically daily as needed (eczema).  No current facility-administered medications for this visit.   Allergies  Allergen Reactions   Codeine Other (See Comments)    Migraines    Fish Allergy Itching   Hydrocodone Other (See Comments)    Migraines    Latex Itching   Percocet [Oxycodone-Acetaminophen] Other (See Comments)    Migraines    Shellfish Allergy Itching     Review of Systems: Positive for vision changes and headache.  All other systems reviewed and negative except where noted in HPI.   Wt Readings from Last 3 Encounters:  11/04/21 (!) 323 lb 9.6 oz (146.8 kg)  10/21/21 (!) 322 lb 5 oz (146.2 kg)  07/22/21 (!) 326 lb 11.6 oz (148.2 kg)    Physical Exam   BP (!) 140/100 (BP Location: Left Wrist, Patient Position: Sitting, Cuff Size: Normal)   Pulse 72   Ht 6' (1.829 m) Comment: height measured without shoes  Wt (!) 325 lb 6 oz (147.6 kg)   BMI 44.13 kg/m  Constitutional:  Generally well appearing, obese female in no acute distress. Psychiatric: Pleasant. Normal mood and affect. Behavior is normal. EENT: Pupils normal.  Conjunctivae are normal. No scleral icterus. Neck supple.  Cardiovascular: Normal rate, regular rhythm. No edema Pulmonary/chest: Effort normal and breath sounds normal. No wheezing, rales or rhonchi. Abdominal: Soft, nondistended, nontender. Bowel sounds active throughout. There are no masses palpable. No hepatomegaly. Neurological: Alert and oriented to person place and time. Skin: Skin is warm and dry. No rashes noted.  Tye Savoy, NP  11/18/2021, 11:33 AM

## 2021-11-18 NOTE — Patient Instructions (Signed)
Your provider has requested that you go to the basement level for lab work before leaving today. Press "B" on the elevator. The lab is located at the first door on the left as you exit the elevator.  If you are age 35 or older, your body mass index should be between 23-30. Your Body mass index is 44.13 kg/m. If this is out of the aforementioned range listed, please consider follow up with your Primary Care Provider.  If you are age 50 or younger, your body mass index should be between 19-25. Your Body mass index is 44.13 kg/m. If this is out of the aformentioned range listed, please consider follow up with your Primary Care Provider.   ________________________________________________________  The [facility] providers would like to encourage you to use [facility] to communicate with providers for non-urgent requests or questions.  Due to long hold times on the telephone, sending your provider a message by [facility] may be a faster and more efficient way to get a response.  Please allow 48 business hours for a response.  Please remember that this is for non-urgent requests.  _______________________________________________________

## 2021-11-21 LAB — HEPATITIS C ANTIBODY: Hepatitis C Ab: NONREACTIVE

## 2021-11-21 LAB — HEPATITIS A ANTIBODY, TOTAL: Hepatitis A AB,Total: NONREACTIVE

## 2021-11-21 LAB — HEPATITIS B SURFACE ANTIBODY,QUALITATIVE: Hep B S Ab: REACTIVE — AB

## 2021-11-21 LAB — HEPATITIS B SURFACE ANTIGEN: Hepatitis B Surface Ag: NONREACTIVE

## 2021-11-21 NOTE — Progress Notes (Signed)
Addendum: Reviewed and agree with assessment and management plan. Alithia Zavaleta M, MD  

## 2021-11-29 ENCOUNTER — Ambulatory Visit: Payer: Medicaid Other | Admitting: Student

## 2021-12-05 ENCOUNTER — Encounter: Payer: Self-pay | Admitting: Student

## 2021-12-05 ENCOUNTER — Ambulatory Visit (INDEPENDENT_AMBULATORY_CARE_PROVIDER_SITE_OTHER): Payer: Medicaid Other | Admitting: Student

## 2021-12-05 VITALS — BP 133/80 | HR 56 | Ht 72.0 in | Wt 330.6 lb

## 2021-12-05 DIAGNOSIS — I10 Essential (primary) hypertension: Secondary | ICD-10-CM

## 2021-12-05 DIAGNOSIS — Z8711 Personal history of peptic ulcer disease: Secondary | ICD-10-CM | POA: Diagnosis present

## 2021-12-05 DIAGNOSIS — R7309 Other abnormal glucose: Secondary | ICD-10-CM

## 2021-12-05 DIAGNOSIS — Z6841 Body Mass Index (BMI) 40.0 and over, adult: Secondary | ICD-10-CM

## 2021-12-05 DIAGNOSIS — R110 Nausea: Secondary | ICD-10-CM

## 2021-12-05 LAB — POCT GLYCOSYLATED HEMOGLOBIN (HGB A1C): Hemoglobin A1C: 5.2 % (ref 4.0–5.6)

## 2021-12-05 MED ORDER — LOSARTAN POTASSIUM-HCTZ 100-25 MG PO TABS: 1.0000 | ORAL_TABLET | Freq: Every day | ORAL | 1 refills | Status: DC

## 2021-12-05 MED ORDER — FAMOTIDINE 20 MG PO TABS: 20.0000 mg | ORAL_TABLET | Freq: Every day | ORAL | 1 refills | Status: DC

## 2021-12-05 NOTE — Progress Notes (Signed)
    SUBJECTIVE:   CHIEF COMPLAINT / HPI:   Hypertension Patient with persistently elevated BP to 170/126 today. Has been elevated at recent visits as well. Recently started on Losartan-HCTZ 50-12.'5mg'$ . Cr has been WNL. Open to increasing dose. No neurological symptoms.   Nausea Patient with persistent nausea for several weeks. Has a history of peptic ulcer >10 years ago. Not currently on any acid-blocking therapies. Girlfriend recently tested for H pylori. Patient requesting testing as well. Nausea with occasional NBNB vomiting. Mild epigastric pain. No reflux symptoms, but did not have these with her ulcer in the past either. No dysphagia or odynophagia.   BMI >40 Patient interested in medications to assist with weight loss. Would be open to GLP-1 agonists. No history of pancreatitis or family hx of MEN/Thyroid CA. Does have a hx of gallstones but is s/p cholecystectomy. Discussed that we would want her nausea symptoms adequately worked-up/treated before starting these agents to avoid muddying the waters. Last A1c >1 year ago 5.2.     OBJECTIVE:   BP (!) 170/126   Pulse (!) 56   Ht 6' (1.829 m)   Wt (!) 330 lb 9.6 oz (150 kg)   SpO2 100%   BMI 44.84 kg/m   Physical Exam Constitutional:      General: She is not in acute distress. Pulmonary:     Effort: Pulmonary effort is normal.  Abdominal:     General: There is no distension.     Palpations: Abdomen is soft.     Tenderness: There is no abdominal tenderness.  Musculoskeletal:        General: No swelling or deformity.  Neurological:     General: No focal deficit present.     Mental Status: Mental status is at baseline.  Psychiatric:        Mood and Affect: Mood normal.        Behavior: Behavior normal.      ASSESSMENT/PLAN:   Primary hypertension Poorly controlled. Will uptitrate Losartan-HCTZ to 100-'25mg'$  daily. - Return in 1 week for future lab visit for BMP  Nausea Given history of PUD, symptoms raise concern  for GERD vs PUD. Not currently on acid-suppressing therapy. Likely exposure to H pylori through partner. Will test for H pylori and guide therapy based on results. If negative, can start with pepcid, if positive, will pursue triple therapy.   BMI 40.0-44.9, adult [facility]) Began discussion regarding GLP-1 agonists. A1c today stable at 5.2. Would be a candidate for GLP-1 but do not want to start until her nausea is worked-up/under control in order to not muddy the waters.  - hope to start semaglutide 0.'25mg'$ /week at next visit if nausea under control     [name], MD [place]

## 2021-12-05 NOTE — Patient Instructions (Addendum)
Ms. Glassberg,  Your BP is still a bit high today--let's increase the dose of your Losartan-HCTZ.  The recommendation is to recheck your kidney function about a week after starting this.  I will put in a future lab order and you can swing by the clinic at your convenience to have this check done.  We are also going to test you for H. pylori given your exposure and history of an ulcer.  I am going to hold off on starting on any medication until we get that test back as the results of the test will guide my therapy.  I will see back in about 4 weeks.  Hopefully at that time we will have your nausea under control and can talk about weight loss medicines at that time.  Stay well!  [name], MD

## 2021-12-06 DIAGNOSIS — R110 Nausea: Secondary | ICD-10-CM | POA: Insufficient documentation

## 2021-12-06 DIAGNOSIS — Z8711 Personal history of peptic ulcer disease: Secondary | ICD-10-CM | POA: Insufficient documentation

## 2021-12-06 NOTE — Assessment & Plan Note (Addendum)
Given history of PUD, symptoms raise concern for GERD vs PUD. Not currently on acid-suppressing therapy. Likely exposure to H pylori through partner. Will test for H pylori and guide therapy based on results. If negative, can start with pepcid, if positive, will pursue triple therapy.

## 2021-12-06 NOTE — Assessment & Plan Note (Signed)
Poorly controlled. Will uptitrate Losartan-HCTZ to 100-'25mg'$  daily. - Return in 1 week for future lab visit for BMP

## 2021-12-06 NOTE — Assessment & Plan Note (Signed)
Began discussion regarding GLP-1 agonists. A1c today stable at 5.2. Would be a candidate for GLP-1 but do not want to start until her nausea is worked-up/under control in order to not muddy the waters.  - hope to start semaglutide 0.'25mg'$ /week at next visit if nausea under control

## 2021-12-07 LAB — H. PYLORI BREATH TEST: H pylori Breath Test: NEGATIVE

## 2021-12-08 ENCOUNTER — Telehealth: Payer: Self-pay

## 2021-12-08 MED ORDER — FAMOTIDINE 20 MG PO TABS: 20.0000 mg | ORAL_TABLET | Freq: Two times a day (BID) | ORAL | 0 refills | Status: DC

## 2021-12-08 NOTE — Telephone Encounter (Signed)
Patient calls nurse line stating that she is returning missed call.   Patient states that she is at work right now and would like for provider to return call to her wife, Mardene Celeste.   She has signed a DPR giving permission to speak with Mardene Celeste.   Please return call to [phone].   Talbot Grumbling, RN

## 2021-12-08 NOTE — Addendum Note (Signed)
Addended by: Jim Like B on: 12/08/2021 08:27 AM   Modules accepted: Orders

## 2021-12-20 ENCOUNTER — Other Ambulatory Visit: Payer: Self-pay | Admitting: Student

## 2021-12-20 DIAGNOSIS — R110 Nausea: Secondary | ICD-10-CM

## 2021-12-20 MED ORDER — ONDANSETRON HCL 4 MG PO TABS: 4.0000 mg | ORAL_TABLET | Freq: Three times a day (TID) | ORAL | 0 refills | Status: DC | PRN

## 2021-12-22 ENCOUNTER — Ambulatory Visit (INDEPENDENT_AMBULATORY_CARE_PROVIDER_SITE_OTHER): Payer: Medicaid Other | Admitting: Student

## 2021-12-22 ENCOUNTER — Ambulatory Visit (HOSPITAL_COMMUNITY): Payer: Medicaid Other | Attending: Family Medicine

## 2021-12-22 ENCOUNTER — Telehealth: Payer: Self-pay

## 2021-12-22 VITALS — BP 146/100 | HR 61 | Ht 72.0 in | Wt 337.8 lb

## 2021-12-22 DIAGNOSIS — I10 Essential (primary) hypertension: Secondary | ICD-10-CM | POA: Diagnosis not present

## 2021-12-22 DIAGNOSIS — R0789 Other chest pain: Secondary | ICD-10-CM | POA: Diagnosis present

## 2021-12-22 DIAGNOSIS — G8929 Other chronic pain: Secondary | ICD-10-CM

## 2021-12-22 DIAGNOSIS — R079 Chest pain, unspecified: Secondary | ICD-10-CM | POA: Diagnosis not present

## 2021-12-22 MED ORDER — DICLOFENAC SODIUM 1 % EX GEL: 2.0000 g | CUTANEOUS | Status: AC | PRN

## 2021-12-22 NOTE — Patient Instructions (Addendum)
It was great to see you! Thank you for allowing me to participate in your care!   Our plans for today:  - Chest pain does not appear to be a heart attack, and may be costochondritis. Inflammation/irritation of the joints in your chest. - Costochondritis  Use Voltaren cream on chest area as needed -Follow up Blood pressure was high today, bring all meds to next visit with Dr. Joelyn Oms to discuss plans to adjust meds for Blood pressure.  Emergency care If you develop chest pain/pressure, with sweating, shortness or breath, pain in jaw or arm, or upset stomach, seek immediate medical attention.    Take care and seek immediate care sooner if you develop any concerns.   Dr. Holley Bouche, MD [place]

## 2021-12-22 NOTE — Assessment & Plan Note (Addendum)
Patient complains of chest pain that spreads to back at times, and has been an issue for years. She's had a negative work up for the chest pain, and reports it hurts with deep breath's and is reproducible with palpation. Patient EKG was normal with no signs of ischemic changes. Given hx and symptoms pain likely 2/2 to costochondritis. Less concerned for PE given no SOB and normal LE exam, or MI given EKG. Patient wanting to try NSAID for pain, but has stomach ulcer. Will try Voltaren gel. -Voltaren gel, prn, up to 4 times in a day -Return precautions given.

## 2021-12-22 NOTE — Progress Notes (Signed)
  SUBJECTIVE:   CHIEF COMPLAINT / HPI:   Chest pain Had blue discoloration of nails today, and pulse ox was 92 at work today. Fingers were all slightly discolored blue up to halfway up nail bed. No pain in hands.   Chest pain, achy and can feel it in her back. The chest pain is constant and has been issue for a long while. But the severity of the pain fluctuates. Has had test run for chest pain (EKG), but nothing found. Occasional pain down right arm, going numb and shake it to wake it back up. Also has pain with deep breath's but no SOB. The chest pain intensity increases sporadically. Patient reports some nausea for last month, constantly, that's been happening for a while as she's soon to be tested for h. Pylori.   HTN Patient reports she takes both amlodipine and Hyzarr.   PERTINENT  PMH / PSH: HTN, Tobacco use, Peptic ulcer disease  OBJECTIVE:  BP (!) 146/100   Pulse 61   Ht 6' (1.829 m)   Wt (!) 337 lb 12.8 oz (153.2 kg)   LMP 11/01/2021 (Exact Date)   SpO2 98%   BMI 45.81 kg/m   General: NAD, pleasant, able to participate in exam Cardiac: RRR, no murmurs auscultated. Respiratory: CTAB, normal effort, no wheezes, rales or rhonchi Abdomen: soft, non-tender, non-distended, normoactive bowel sounds Extremities: warm and well perfused, no edema or cyanosis.  ASSESSMENT/PLAN:  Chronic chest pain Patient complains of chest pain that spreads to back at times, and has been an issue for years. She's had a negative work up for the chest pain, and reports it hurts with deep breath's and is reproducible with palpation. Patient EKG was normal with no signs of ischemic changes. Given hx and symptoms pain likely 2/2 to costochondritis. Less concerned for PE given no SOB and normal LE exam, or MI given EKG. Patient wanting to try NSAID for pain, but has stomach ulcer. Will try Voltaren gel. -Voltaren gel, prn, up to 4 times in a day -Return precautions given.   Primary hypertension Patient  BP elevated to 146/100 on recheck. Patient repots she is taking all her meds, but had forgotten about her amlodipine when reviewing meds. Patient may need adjustment in antihypertensives, but unsure if patient is consistently taking. Will have patient f/u with PCP and bring home meds to discuss changes to HTN regimen. -F/u with PCP   Orders Placed This Encounter  Procedures   EKG 12-Lead   EKG 12-Lead    Ordered by an unspecified provider    Meds ordered this encounter  Medications   diclofenac Sodium (VOLTAREN) 1 % topical gel 2 g   No follow-ups on file. '@SIGNNOTE'$ @

## 2021-12-22 NOTE — Assessment & Plan Note (Addendum)
Patient BP elevated to 146/100 on recheck. Patient repots she is taking all her meds, but had forgotten about her amlodipine when reviewing meds. Patient may need adjustment in antihypertensives, but unsure if patient is consistently taking. Will have patient f/u with PCP and bring home meds to discuss changes to HTN regimen. -F/u with PCP

## 2021-12-22 NOTE — Telephone Encounter (Signed)
Patient sent mychart message regarding intermittent chest pain and blue discoloration to fingertips. She reports SpO2 of 92%.   Called patient to discuss further. Patient reports that she has been having intermittent mid sternal chest pain since last week. States that pain is worsened with deep breaths. Describes pain as stabbing and pins and needles. States that she will also have intermittent R sided arm tingling.   She denies current SHOB, discoloration around mouth, diaphoresis. No recent travel. She does smoke.   Precepted with Dr. Erin Hearing who advised that patient could be seen this afternoon in RN triage spot.   Scheduled patient and provided with ED precautions in the meantime.   Talbot Grumbling, RN

## 2021-12-27 ENCOUNTER — Encounter (HOSPITAL_COMMUNITY): Payer: Self-pay

## 2021-12-27 ENCOUNTER — Ambulatory Visit (HOSPITAL_COMMUNITY)
Admission: EM | Admit: 2021-12-27 | Discharge: 2021-12-27 | Disposition: A | Discharge status: Home / Self Care | Payer: Medicaid Other | Attending: Internal Medicine | Admitting: Internal Medicine

## 2021-12-27 DIAGNOSIS — L299 Pruritus, unspecified: Secondary | ICD-10-CM | POA: Diagnosis not present

## 2021-12-27 DIAGNOSIS — L309 Dermatitis, unspecified: Secondary | ICD-10-CM | POA: Diagnosis not present

## 2021-12-27 DIAGNOSIS — J029 Acute pharyngitis, unspecified: Secondary | ICD-10-CM | POA: Insufficient documentation

## 2021-12-27 DIAGNOSIS — J069 Acute upper respiratory infection, unspecified: Secondary | ICD-10-CM | POA: Insufficient documentation

## 2021-12-27 DIAGNOSIS — Z20822 Contact with and (suspected) exposure to covid-19: Secondary | ICD-10-CM | POA: Insufficient documentation

## 2021-12-27 DIAGNOSIS — R0981 Nasal congestion: Secondary | ICD-10-CM | POA: Diagnosis not present

## 2021-12-27 DIAGNOSIS — Z1152 Encounter for screening for COVID-19: Secondary | ICD-10-CM

## 2021-12-27 LAB — POCT RAPID STREP A, ED / UC: Streptococcus, Group A Screen (Direct): NEGATIVE

## 2021-12-27 LAB — SARS CORONAVIRUS 2 (TAT 6-24 HRS): SARS Coronavirus 2: NEGATIVE

## 2021-12-27 MED ORDER — TRIAMCINOLONE ACETONIDE 0.1 % EX CREA: 1.0000 | TOPICAL_CREAM | Freq: Two times a day (BID) | CUTANEOUS | 0 refills | Status: DC

## 2021-12-27 MED ORDER — DM-GUAIFENESIN ER 30-600 MG PO TB12: 1.0000 | ORAL_TABLET | Freq: Two times a day (BID) | ORAL | 0 refills | Status: DC

## 2021-12-27 NOTE — ED Provider Notes (Signed)
[place]    CSN: [id number] Arrival date & time: 12/27/21  1308      History   Chief Complaint Chief Complaint  Patient presents with   Sore Throat    HPI Tabitha Holland is a 36 y.o. female.   36 year old female presents with sore throat and congestion.  Patient relates for the past 2 days she has been having increasing sore throat and painful swallowing, she also has some mild sinus congestion mainly frontal and maxillary with rhinitis, clear production.  Patient also indicates that she is having some chest congestion with intermittent cough, production is thick, and brownish in color.  She indicates that she is having some mild fatigue associated with her symptoms.  She does relate that she works in an [employer] and is exposed to a lot of different people coming through the office.  She has been taking some Tylenol and lozenges with minimal relief from the sore throat pain.  Patient denies fever or chills. She also indicates that she was bitten by an insect several days ago on the top of the left foot, this is causing her a lot of itching and irritation.  She also relates that she has had some redness at the site.  She has not been able to use any cream yet to apply to the area to reduce the itching.   Sore Throat    Past Medical History:  Diagnosis Date   Anxiety    Depression    Gallstones    History of anemia    per pt on 02/02/2021   History of suicidal ideation 10/02/2019   Hx of wisdom tooth extraction    Hypertension    Migraines    occasional per pt 02/02/2021   Morbid obesity [facility])     Patient Active Problem List   Diagnosis Date Noted   Chronic chest pain 12/22/2021   Nausea 12/06/2021   Personal history of peptic ulcer disease 12/06/2021   Hepatomegaly 11/04/2021   Encounter to establish care with new doctor 11/04/2021   Submucous uterine fibroid 02/02/2021   Abnormal uterine bleeding 11/22/2020   Pelvic pain 11/22/2020   Previous  cesarean section 05/26/2018   BMI 40.0-44.9, adult ([place]) 05/26/2018   Marijuana abuse 05/26/2018   Tobacco use 05/12/2013   Primary hypertension 05/12/2013    Past Surgical History:  Procedure Laterality Date   CESAREAN SECTION N/A 10/28/2013   Procedure: CESAREAN SECTION;  Surgeon: Emily Filbert, MD;  Location: [facility];  Service: Obstetrics;  Laterality: N/A;   CESAREAN SECTION N/A 09/15/2015   Procedure: CESAREAN SECTION;  Surgeon: Mora Bellman, MD;  Location: [place];  Service: Obstetrics;  Laterality: N/A;   CESAREAN SECTION N/A 05/26/2018   Procedure: CESAREAN SECTION;  Surgeon: Woodroe Mode, MD;  Location: [place];  Service: Obstetrics;  Laterality: N/A;   CHOLECYSTECTOMY     DILATATION & CURETTAGE/HYSTEROSCOPY WITH MYOSURE N/A 02/09/2021   Procedure: DILATATION & CURETTAGE/HYSTEROSCOPY WITH MYOSURE;  Surgeon: [name], MD;  Location: [place];  Service: Gynecology;  Laterality: N/A;  fibroid resection   WISDOM TOOTH EXTRACTION      OB History     Gravida  5   Para  5   Term  5   Preterm  0   AB  0   Living  5      SAB  0   IAB  0   Ectopic  0   Multiple  0  Live Births  5            Home Medications    Prior to Admission medications   Medication Sig Start Date End Date Taking? Authorizing Provider  dextromethorphan-guaiFENesin (MUCINEX DM) 30-600 MG 12hr tablet Take 1 tablet by mouth 2 (two) times daily. 12/27/21  Yes Nyoka Lint, PA-C  triamcinolone cream (KENALOG) 0.1 % Apply 1 Application topically 2 (two) times daily. 12/27/21  Yes Nyoka Lint, PA-C  amLODipine (NORVASC) 10 MG tablet Take 1 tablet (10 mg total) by mouth daily. 08/03/20   Kerin Perna, NP  baclofen (LIORESAL) 10 MG tablet Take 1 tablet (10 mg total) by mouth 2 (two) times daily as needed for muscle spasms. 11/07/21   Raspet, Derry Skill, PA-C  famotidine (PEPCID) 20 MG tablet Take 1 tablet (20 mg total) by mouth 2 (two) times daily.  12/08/21   Eppie Gibson, MD  losartan-hydrochlorothiazide (HYZAAR) 100-25 MG tablet Take 1 tablet by mouth daily. 12/05/21   Eppie Gibson, MD  ondansetron (ZOFRAN) 4 MG tablet Take 1 tablet (4 mg total) by mouth every 8 (eight) hours as needed for nausea or vomiting. 12/20/21   Eppie Gibson, MD  Ruxolitinib Phosphate (OPZELURA) 1.5 % CREA Apply 1 Application topically daily as needed (eczema).    [provider]    Family History Family History  Adopted: Yes  Problem Relation Age of Onset   Hypertension Sister    Bipolar disorder Brother    Schizophrenia Brother     Social History Social History   Tobacco Use   Smoking status: Some Days    Packs/day: 0.25    Types: Cigarettes   Smokeless tobacco: Never  Vaping Use   Vaping Use: Never used  Substance Use Topics   Alcohol use: Yes    Comment: 2-3 weekly   Drug use: Yes    Types: Marijuana     Allergies   Codeine, Fish allergy, Hydrocodone, Latex, Percocet [oxycodone-acetaminophen], and Shellfish allergy   Review of Systems Review of Systems  HENT:  Positive for postnasal drip, rhinorrhea, sinus pressure, sinus pain and sore throat.   Skin:  Positive for rash (left foot).     Physical Exam Triage Vital Signs ED Triage Vitals  Enc Vitals Group     BP 12/27/21 1334 (!) 146/88     Pulse Rate 12/27/21 1334 63     Resp 12/27/21 1334 16     Temp 12/27/21 1334 97.9 F (36.6 C)     Temp Source 12/27/21 1334 Oral     SpO2 12/27/21 1334 98 %     Weight --      Height --      Head Circumference --      Peak Flow --      Pain Score 12/27/21 1336 7     Pain Loc --      Pain Edu? --      Excl. in [place]? --    No data found.  Updated Vital Signs BP (!) 146/88   Pulse 63   Temp 97.9 F (36.6 C) (Oral)   Resp 16   LMP 12/24/2021   SpO2 98%   Visual Acuity Right Eye Distance:   Left Eye Distance:   Bilateral Distance:    Right Eye Near:   Left Eye Near:    Bilateral Near:     Physical  Exam Constitutional:      Appearance: She is well-developed.  HENT:  Right Ear: Tympanic membrane and ear canal normal.     Left Ear: Tympanic membrane and ear canal normal.     Mouth/Throat:     Mouth: Mucous membranes are moist.     Pharynx: Oropharynx is clear. Posterior oropharyngeal erythema present. No oropharyngeal exudate.  Cardiovascular:     Rate and Rhythm: Normal rate and regular rhythm.     Heart sounds: Normal heart sounds.  Pulmonary:     Effort: Pulmonary effort is normal.     Breath sounds: Normal breath sounds and air entry. No wheezing, rhonchi or rales.  Lymphadenopathy:     Cervical: No cervical adenopathy.  Skin:         Comments: Left foot: There is a grouping of 5 bite sites with excoriations and some scabbing present at the mid dorsum of the foot, minimal redness, no drainage or streaking.  Neurological:     Mental Status: She is alert.      UC Treatments / Results  Labs (all labs ordered are listed, but only abnormal results are displayed) Labs Reviewed  SARS CORONAVIRUS 2 (TAT 6-24 HRS)  CULTURE, GROUP A STREP [facility])  POCT RAPID STREP A, ED / UC    EKG   Radiology No results found.  Procedures Procedures (including critical care time)  Medications Ordered in UC Medications - No data to display  Initial Impression / Assessment and Plan / UC Course  I have reviewed the triage vital signs and the nursing notes.  Pertinent labs & imaging results that were available during my care of the patient were reviewed by me and considered in my medical decision making (see chart for details).    Plan: 1.  Advised to use salt water gargles frequently and lozenges to help soothe the pain the sore throat. 2.  Advised to take Mucinex DM every 12 hours to help decrease the chest congestion and cough. 3.  Advised take ibuprofen and Tylenol to help reduce the pain and discomfort. 4.  Advised to apply the triamcinolone to the insect bite areas 3 times  daily to help reduce itching and irritation. 5.  Throat culture is pending. 6.  Advised to follow-up with PCP or return to urgent care if symptoms fail to improve. Final Clinical Impressions(s) / UC Diagnoses   Final diagnoses:  Sore throat  Viral upper respiratory tract infection  Sinus congestion  Dermatitis  Itching     Discharge Instructions      Advised to take Mucinex DM for cough and congestion every 12 hours. Advised to continue using salt water gargles and lozenges to help soothe the throat. Advised take Tylenol or ibuprofen as needed for pain and discomfort. Advised to use triamcinolone cream 3 times a day as needed for itching and irritation.    ED Prescriptions     Medication Sig Dispense Auth. Provider   triamcinolone cream (KENALOG) 0.1 % Apply 1 Application topically 2 (two) times daily. 30 g Nyoka Lint, PA-C   dextromethorphan-guaiFENesin [facility] DM) 30-600 MG 12hr tablet Take 1 tablet by mouth 2 (two) times daily. 14 tablet Nyoka Lint, PA-C      PDMP not reviewed this encounter.   Nyoka Lint, PA-C 12/27/21 1408

## 2021-12-27 NOTE — Discharge Instructions (Addendum)
Advised to take Mucinex DM for cough and congestion every 12 hours. Advised to continue using salt water gargles and lozenges to help soothe the throat. Advised take Tylenol or ibuprofen as needed for pain and discomfort. Advised to use triamcinolone cream 3 times a day as needed for itching and irritation.

## 2021-12-27 NOTE — ED Triage Notes (Signed)
Pt states sore throat and fever for the past three days.  Also had possible bug bites on her left foot.

## 2021-12-30 ENCOUNTER — Encounter (HOSPITAL_COMMUNITY): Payer: Self-pay | Admitting: *Deleted

## 2021-12-30 ENCOUNTER — Other Ambulatory Visit: Payer: Self-pay

## 2021-12-30 ENCOUNTER — Encounter: Payer: Self-pay | Admitting: Student

## 2021-12-30 ENCOUNTER — Emergency Department (HOSPITAL_COMMUNITY): Payer: Medicaid Other

## 2021-12-30 ENCOUNTER — Ambulatory Visit (HOSPITAL_COMMUNITY)
Admission: RE | Admit: 2021-12-30 | Discharge: 2021-12-30 | Disposition: A | Discharge status: Home / Self Care | Payer: Medicaid Other | Source: Ambulatory Visit | Attending: Family Medicine | Admitting: Family Medicine

## 2021-12-30 ENCOUNTER — Ambulatory Visit (INDEPENDENT_AMBULATORY_CARE_PROVIDER_SITE_OTHER): Payer: Medicaid Other | Admitting: Student

## 2021-12-30 ENCOUNTER — Emergency Department (HOSPITAL_COMMUNITY)
Admission: EM | Admit: 2021-12-30 | Discharge: 2021-12-30 | Disposition: A | Discharge status: Home / Self Care | Payer: Medicaid Other | Attending: Emergency Medicine | Admitting: Emergency Medicine

## 2021-12-30 VITALS — BP 156/117 | HR 73 | Wt 338.0 lb

## 2021-12-30 DIAGNOSIS — R079 Chest pain, unspecified: Secondary | ICD-10-CM

## 2021-12-30 DIAGNOSIS — R072 Precordial pain: Secondary | ICD-10-CM | POA: Insufficient documentation

## 2021-12-30 DIAGNOSIS — I10 Essential (primary) hypertension: Secondary | ICD-10-CM | POA: Diagnosis not present

## 2021-12-30 DIAGNOSIS — R001 Bradycardia, unspecified: Secondary | ICD-10-CM | POA: Diagnosis not present

## 2021-12-30 DIAGNOSIS — R519 Headache, unspecified: Secondary | ICD-10-CM | POA: Insufficient documentation

## 2021-12-30 DIAGNOSIS — R600 Localized edema: Secondary | ICD-10-CM | POA: Diagnosis not present

## 2021-12-30 DIAGNOSIS — Z91040 Latex allergy status: Secondary | ICD-10-CM | POA: Diagnosis not present

## 2021-12-30 DIAGNOSIS — Z79899 Other long term (current) drug therapy: Secondary | ICD-10-CM | POA: Insufficient documentation

## 2021-12-30 DIAGNOSIS — R030 Elevated blood-pressure reading, without diagnosis of hypertension: Secondary | ICD-10-CM

## 2021-12-30 DIAGNOSIS — R0789 Other chest pain: Secondary | ICD-10-CM

## 2021-12-30 LAB — BASIC METABOLIC PANEL
Anion gap: 8 (ref 5–15)
BUN: 8 mg/dL (ref 6–20)
CO2: 27 mmol/L (ref 22–32)
Calcium: 8.9 mg/dL (ref 8.9–10.3)
Chloride: 102 mmol/L (ref 98–111)
Creatinine, Ser: 0.74 mg/dL (ref 0.44–1.00)
GFR, Estimated: >60 mL/min (ref >60)
Glucose, Bld: 84 mg/dL (ref 70–99)
Potassium: 3.7 mmol/L (ref 3.5–5.1)
Sodium: 137 mmol/L (ref 135–145)

## 2021-12-30 LAB — CULTURE, GROUP A STREP (THRC)

## 2021-12-30 LAB — CBC
HCT: 33.5 % — ABNORMAL LOW (ref 36.0–46.0)
Hemoglobin: 11.1 g/dL — ABNORMAL LOW (ref 12.0–15.0)
MCH: 29.1 pg (ref 26.0–34.0)
MCHC: 33.1 g/dL (ref 30.0–36.0)
MCV: 87.9 fL (ref 80.0–100.0)
Platelets: 202 10*3/uL (ref 150–400)
RBC: 3.81 MIL/uL — ABNORMAL LOW (ref 3.87–5.11)
RDW: 13.2 % (ref 11.5–15.5)
WBC: 7.1 10*3/uL (ref 4.0–10.5)
nRBC: 0 % (ref 0.0–0.2)

## 2021-12-30 LAB — TROPONIN I (HIGH SENSITIVITY)
Troponin I (High Sensitivity): 4 ng/L (ref <18)
Troponin I (High Sensitivity): 4 ng/L (ref <18)

## 2021-12-30 LAB — I-STAT BETA HCG BLOOD, ED (MC, WL, AP ONLY): I-stat hCG, quantitative: <5 m[IU]/mL (ref <5)

## 2021-12-30 MED ORDER — ASPIRIN 325 MG PO TABS
325.0000 mg | ORAL_TABLET | Freq: Once | ORAL | Status: AC
  Administered 2021-12-30: 325 mg via ORAL

## 2021-12-30 MED ORDER — METOCLOPRAMIDE HCL 5 MG/ML IJ SOLN
5.0000 mg | Freq: Once | INTRAMUSCULAR | Status: AC
Start: 2021-12-30 — End: 2021-12-30
  Administered 2021-12-30: 5 mg via INTRAVENOUS
  Filled 2021-12-30: qty 2

## 2021-12-30 MED ORDER — DIPHENHYDRAMINE HCL 50 MG/ML IJ SOLN
12.5000 mg | Freq: Once | INTRAMUSCULAR | Status: AC
Start: 2021-12-30 — End: 2021-12-30
  Administered 2021-12-30: 12.5 mg via INTRAVENOUS
  Filled 2021-12-30: qty 1

## 2021-12-30 MED ORDER — HYDRALAZINE HCL 20 MG/ML IJ SOLN
5.0000 mg | Freq: Once | INTRAMUSCULAR | Status: AC
  Administered 2021-12-30: 5 mg via INTRAVENOUS
  Filled 2021-12-30: qty 1

## 2021-12-30 MED ORDER — SODIUM CHLORIDE 0.9 % IV BOLUS
500.0000 mL | Freq: Once | INTRAVENOUS | Status: AC
  Administered 2021-12-30: 500 mL via INTRAVENOUS

## 2021-12-30 MED ORDER — KETOROLAC TROMETHAMINE 30 MG/ML IJ SOLN
30.0000 mg | Freq: Once | INTRAMUSCULAR | Status: AC
Start: 2021-12-30 — End: 2021-12-30
  Administered 2021-12-30: 30 mg via INTRAVENOUS
  Filled 2021-12-30: qty 1

## 2021-12-30 NOTE — ED Notes (Signed)
Pt provided snack at this time.

## 2021-12-30 NOTE — Discharge Instructions (Addendum)
Get help right away if you: Develop a severe headache or confusion. Have unusual weakness or numbness. Feel faint. Have severe pain in your chest or abdomen. Vomit repeatedly. Have trouble breathing.

## 2021-12-30 NOTE — ED Provider Notes (Signed)
[facility] EMERGENCY DEPARTMENT Provider Note   CSN: [id number] Arrival date & time: 12/30/21  1442     History  Chief Complaint  Patient presents with   Chest Pain    Tabitha Holland is a 36 y.o. female.  36 y/o F who  presents with a cc of chest pain and headache.  Patient states that she noticed last night her legs were swollen bilaterally.  She has had this in the past but not recently.  She states that she knew she forgot to take her blood pressure medication and took it before bed.  When she woke up this morning she was not feeling great.  By the time she got to work she began having a headache and pleuritic chest pain which she describes as located in the center of her chest behind her sternum.  Has been progressively worsening.  She began having a throbbing "migraine" headache which has been progressively worsening.  She kept checking her blood pressure at work and noticed that it was going higher.  She went to see her physician today for a scheduled doctor's appointment to evaluate your blood pressure and was sent here by her PCP due to blood pressures with systolic of 222.  Patient denies use of exogenous estrogens, personal history of cancer, personal history of DVT or PE, recent travel, confinement or trauma.  Patient has a previous history of osteochondritis.  She was given aspirin and nitroglycerin which did not help her chest pain or her headache.  She denies changes in vision unilateral weakness difficulty with speech or swallowing weakness on one side of her body.  Took her regular medications today.   Chest Pain Pain location:  Substernal area Pain quality: sharp   Pain radiates to:  Does not radiate Pain severity:  Moderate Onset quality:  Gradual Duration:  5 hours Timing:  Constant Progression:  Worsening Chronicity:  New Context: breathing   Relieved by:  Nothing Worsened by:  Nothing Ineffective treatments:  None tried Associated symptoms:  headache   Associated symptoms: no orthopnea, no PND, no shortness of breath and no vomiting   Headaches:    Severity:  Severe   Onset quality:  Gradual   Duration:  5 hours   Timing:  Constant   Chronicity:  Recurrent Risk factors: hypertension and obesity   Risk factors: no diabetes mellitus and no prior DVT/PE        Home Medications Prior to Admission medications   Medication Sig Start Date End Date Taking? Authorizing Provider  amLODipine (NORVASC) 10 MG tablet Take 1 tablet (10 mg total) by mouth daily. 08/03/20   Kerin Perna, NP  baclofen (LIORESAL) 10 MG tablet Take 1 tablet (10 mg total) by mouth 2 (two) times daily as needed for muscle spasms. 11/07/21   Raspet, Derry Skill, PA-C  dextromethorphan-guaiFENesin (MUCINEX DM) 30-600 MG 12hr tablet Take 1 tablet by mouth 2 (two) times daily. 12/27/21   Nyoka Lint, PA-C  famotidine (PEPCID) 20 MG tablet Take 1 tablet (20 mg total) by mouth 2 (two) times daily. 12/08/21   Eppie Gibson, MD  losartan-hydrochlorothiazide (HYZAAR) 100-25 MG tablet Take 1 tablet by mouth daily. 12/05/21   Eppie Gibson, MD  ondansetron (ZOFRAN) 4 MG tablet Take 1 tablet (4 mg total) by mouth every 8 (eight) hours as needed for nausea or vomiting. 12/20/21   Eppie Gibson, MD  Ruxolitinib Phosphate (OPZELURA) 1.5 % CREA Apply 1 Application topically daily as needed (  eczema).    [provider]  triamcinolone cream (KENALOG) 0.1 % Apply 1 Application topically 2 (two) times daily. 12/27/21   Nyoka Lint, PA-C      Allergies    Codeine, Fish allergy, Hydrocodone, Latex, Percocet [oxycodone-acetaminophen], and Shellfish allergy    Review of Systems   Review of Systems  Respiratory:  Negative for shortness of breath.   Cardiovascular:  Positive for chest pain. Negative for orthopnea and PND.  Gastrointestinal:  Negative for vomiting.  Neurological:  Positive for headaches.    Physical Exam Updated Vital Signs BP (!) 160/109 (BP  Location: Left Wrist)   Pulse (!) 52   Temp 98.4 F (36.9 C) (Oral)   Resp 18   Ht 6' (1.829 m)   Wt (!) 153.3 kg   LMP 12/24/2021   SpO2 100%   BMI 45.84 kg/m  Physical Exam Vitals and nursing note reviewed.  Constitutional:      General: She is not in acute distress.    Appearance: She is well-developed. She is not diaphoretic.  HENT:     Head: Normocephalic and atraumatic.     Nose: Nose normal.     Mouth/Throat:     Mouth: Mucous membranes are moist.  Eyes:     General: No scleral icterus.    Conjunctiva/sclera: Conjunctivae normal.     Pupils: Pupils are equal, round, and reactive to light.     Comments: No horizontal, vertical or rotational nystagmus  Neck:     Comments: Full active and passive ROM without pain No midline or paraspinal tenderness No nuchal rigidity or meningeal signs Cardiovascular:     Rate and Rhythm: Regular rhythm. Bradycardia present.     Pulses:          Dorsalis pedis pulses are 2+ on the right side and 2+ on the left side.  Pulmonary:     Effort: Pulmonary effort is normal. No respiratory distress.     Breath sounds: No wheezing or rales.  Abdominal:     General: There is no distension.     Palpations: Abdomen is soft.     Tenderness: There is no abdominal tenderness. There is no guarding or rebound.  Musculoskeletal:        General: Normal range of motion.     Cervical back: Normal range of motion and neck supple.     Right lower leg: 1+ Pitting Edema present.     Left lower leg: 1+ Pitting Edema present.  Lymphadenopathy:     Cervical: No cervical adenopathy.  Skin:    General: Skin is warm and dry.     Findings: No rash.  Neurological:     Mental Status: She is alert and oriented to person, place, and time.     Cranial Nerves: No cranial nerve deficit.     Motor: No abnormal muscle tone.     Coordination: Coordination normal.     Comments: Mental Status:  Alert, oriented, thought content appropriate. Speech fluent without  evidence of aphasia. Able to follow 2 step commands without difficulty.  Cranial Nerves:  II:  Peripheral visual fields grossly normal, pupils equal, round, reactive to light III,IV, VI: ptosis not present, extra-ocular motions intact bilaterally  V,VII: smile symmetric, facial light touch sensation equal VIII: hearing grossly normal bilaterally  IX,X: midline uvula rise  XI: bilateral shoulder shrug equal and strong XII: midline tongue extension  Motor:  5/5 in upper and lower extremities bilaterally including strong and equal grip strength and  dorsiflexion/plantar flexion Sensory: Pinprick and light touch normal in all extremities.  Cerebellar: normal finger-to-nose with bilateral upper extremities Gait: normal gait and balance CV: distal pulses palpable throughout   Psychiatric:        Behavior: Behavior normal.        Thought Content: Thought content normal.        Judgment: Judgment normal.     ED Results / Procedures / Treatments   Labs (all labs ordered are listed, but only abnormal results are displayed) Labs Reviewed  BASIC METABOLIC PANEL  CBC  I-STAT BETA HCG BLOOD, ED ([facility], [facility], [facility] ONLY)  TROPONIN I (HIGH SENSITIVITY)    EKG None  Radiology No results found.  Procedures Procedures    Medications Ordered in ED Medications  sodium chloride 0.9 % bolus 500 mL (has no administration in time range)  metoCLOPramide (REGLAN) injection 5 mg (has no administration in time range)  ketorolac (TORADOL) 30 MG/ML injection 30 mg (has no administration in time range)  diphenhydrAMINE (BENADRYL) injection 12.5 mg (has no administration in time range)    ED Course/ Medical Decision Making/ A&P Clinical Course as of 12/30/21 1903  Fri Dec 30, 2021  1900 ED EKG EKG shows sinus bradycardia at a rate of 56.  She does not have atrial flutter.  She does have baseline artifact [AH]  1900 Troponin I (High Sensitivity) Opponent is negative x2 [AH]  1900 I-Stat beta hCG blood,  ED hCG negative [AH]  1900 CBC(!) CBC shows mild chronic anemia [AH]  1275 Basic metabolic panel BMP unremarkable [AH]  1901 DG Chest Port 1 View Portable 1 view chest x-ray is negative for acute abnormality.  I personally visualized and interpreted x-ray findings [AH]    Clinical Course User Index [AH] Margarita Mail, PA-C                           Medical Decision Making This patient presents to the ED for concern of HTN, Headache,and Chest pain, this involves an extensive number of treatment options, and is a complaint that carries with it a high risk of complications and morbidity.  The differential diagnosis includes The differential diagnosis for hypertension includes but is not limited to hypertensive emergency, hypertensive urgency, stroke, sympathomimetic ingestion, acute pulmonary edema, ischemic stroke, intracranial hemorrhage, preeclampsia or eclampsia, autonomic dysreflexia, acute glomerulonephritis, type I MI, volume overload, urinary obstruction, pain, renal artery stenosis, polycystic kidney disease, Cushing syndrome, OSA, pheochromocytoma, hyperaldosteronism, hypothyroidism, anxiety  Patient does not appear to have ACS and here hx is not concerning for this. She does not have sxs concerning for [facility], ICH, meningitis.  Patient does not have neurologic deficits suggestive of dissection.  Patient's headache treated here in the emergency department with resolution of her headache symptoms.  She is PERC negative and I have no suspicion for pulmonary embolus as cause of her pleuritic chest pain.  She has a history of chest wall pain and costochondritis which is likely the underlying cause of her symptoms.  Patient will need to follow-up with her primary care doctor for better blood pressure management.  She does not appear to have any evidence of pulmonary edema or volume overload other than 1+ pitting edema in the lower extremities which may be secondary to her amlodipine.   Co  morbidities that complicate the patient evaluation       Hypertension, obesity   Additional history obtained:  Additional history obtained from spouse at bedside  Lab Tests:  I Ordered, and personally interpreted labs.  As discussed in ED course Imaging Studies ordered:  I ordered imaging studies including chest x-ray I independently visualized and interpreted imaging which showed no acute findings I agree with the radiologist interpretation   Cardiac Monitoring:       The patient was maintained on a cardiac monitor.  I personally viewed and interpreted the cardiac monitored which showed an underlying rhythm of: Sinus bradycardia   Medicines ordered and prescription drug management:  I ordered medication including Reglan, Toradol, Benadryl for headache Reevaluation of the patient after these medicines showed that the patient resolved I have reviewed the patients home medicines and have made adjustments as needed   Test Considered:       CT head imaging although patient has a normal neurologic exam and I think this is unnecessary at this time also considered CT angiogram for dissection study however I did have extremely low suspicion for this given her presentation, complaint and lack of neurologic abnormalities   Critical Interventions:       N/A   Consultations Obtained: N/A   Problem List / ED Course:       Elevated blood pressure reading, headache, atypical chest pain   Reevaluation:  After the interventions noted above, I reevaluated the patient and found that they have :improved   Social Determinants of Health:       Has help at home, outpatient follow-up   Dispostion:  After consideration of the diagnostic results and the patients response to treatment, I feel that the patent would benefit from discharge    Amount and/or Complexity of Data Reviewed Independent Historian: spouse Labs: ordered. Decision-making details documented in ED  Course. Radiology: ordered and independent interpretation performed. Decision-making details documented in ED Course. ECG/medicine tests: independent interpretation performed. Decision-making details documented in ED Course.  Risk Prescription drug management.          Final Clinical Impression(s) / ED Diagnoses Final diagnoses:  Atypical chest pain  Bad headache  Elevated blood pressure reading    Rx / DC Orders ED Discharge Orders     None         Margarita Mail, PA-C 58/52/77 [id number]    Gray, Hana P, DO 35/36/14 [id number]

## 2021-12-30 NOTE — Progress Notes (Signed)
    SUBJECTIVE:   CHIEF COMPLAINT / HPI:   Chest Pain Patient presents today with new 10/10 chest pain since 10:30a this morning. Describes pain as "crushing" and is a new sensation that she has never experienced this before. CP is accompanied by shortness of breath and new LE edema.  Has a history of hypertension that has been difficult to control. CP is accompanied by nausea, but patient does have chronic nausea. Of note, was seen in our office 1 week ago for CP but was with without SOB or LE edema at that time. Given new physical exam findings and symptoms at this time, advise emergency care.    OBJECTIVE:   BP (!) 156/117   Pulse 73   Wt (!) 338 lb (153.3 kg)   LMP 12/24/2021   SpO2 100%   BMI 45.84 kg/m   Physical Exam Vitals reviewed.  Constitutional:      Appearance: She is obese.     Comments: Uncomfortable and frightened appearing  Cardiovascular:     Rate and Rhythm: Regular rhythm. Bradycardia present.     Heart sounds: No murmur heard.    No friction rub. No gallop.  Pulmonary:     Comments: Shallow breaths, lung exam somewhat limited by body habitus but no obvious crackles or rhonchi Abdominal:     General: There is no distension.     Palpations: Abdomen is soft.     Tenderness: There is no abdominal tenderness.  Musculoskeletal:     Right lower leg: Edema present.     Left lower leg: Edema present.     Comments: 2+ to the knees bilaterally      ASSESSMENT/PLAN:   Chest pain Patient with a history of chronic chest pain with new symptoms today. Symptoms described as crushing and accompanied by SOB, nausea, and new LE Edema today raise concern for an acute cardiac event. ACS vs new CHF. ECG obtained in clinic is without ST elevation or depression. Of note, I have noted patient to have a pulsatile liver on prior exams and am therefore somewhat concerned for tricuspid incompetence.   - Patient given ASA '325mg'$  in clinic - Transport to ED via EMS for cardiac  enzymes and further workup for ACS     [name], MD [place]

## 2021-12-30 NOTE — ED Notes (Signed)
RN reviewed discharge instructions w/ pt. Follow up reviewed, no further questions

## 2021-12-30 NOTE — Assessment & Plan Note (Addendum)
Patient with a history of chronic chest pain with new symptoms today. Symptoms described as crushing and accompanied by SOB, nausea, and new LE Edema today raise concern for an acute cardiac event. ACS vs new CHF. ECG obtained in clinic is without ST elevation or depression. Of note, I have noted patient to have a pulsatile liver on prior exams and am therefore somewhat concerned for tricuspid incompetence.   - Patient given ASA '325mg'$  in clinic - Transport to ED via EMS for cardiac enzymes and further workup for ACS

## 2021-12-30 NOTE — ED Triage Notes (Addendum)
Patient presents to ed via [facility] from family practice center with c/o chest pain onset last pm also c/o lower ext edema. Denies sob , however states it hurts to take a deep breath. Patient is alert and oriented. Patient was given 1 baby asa at MD office and was given ntg x1 per ems.

## 2022-01-21 ENCOUNTER — Telehealth: Payer: Self-pay | Admitting: Student

## 2022-01-21 ENCOUNTER — Other Ambulatory Visit: Payer: Self-pay | Admitting: Student

## 2022-01-21 DIAGNOSIS — R600 Localized edema: Secondary | ICD-10-CM

## 2022-01-21 NOTE — Telephone Encounter (Signed)
Called patient to follow-up recent ED visit for chest pain. Unfortunately, she feels that her complaints were not taken seriously by ED provider and remains concerned for abnormalities of the heart that are being overlooked. She continues to have lower extremity edema despite conservative measures (elevation, compression). Given persistent LE edema and cardiopulmonary complaints (CP, SOB) I think it is very reasonable to pursue echocardiography. - Echo complete  We also discussed her dyspepsia, which is improving on pepcid, though she still has some intermittent nausea. Previously tested negative for H pylori. - Continue conservative management - Will see her back in a few weeks to further discuss nausea  [name], MD

## 2022-01-23 ENCOUNTER — Telehealth: Payer: Self-pay | Admitting: *Deleted

## 2022-01-23 NOTE — Telephone Encounter (Signed)
-----   Message from Eppie Gibson, MD sent at 01/21/2022  9:31 AM EDT ----- I have ordered an echocardiogram for patient, please assist her in scheduling this. Thanks!

## 2022-01-23 NOTE — Telephone Encounter (Signed)
LMOVM informing pt of echo appt. Meiko Ives Kennon Holter, CMA

## 2022-01-25 ENCOUNTER — Ambulatory Visit (HOSPITAL_COMMUNITY)
Admission: RE | Admit: 2022-01-25 | Discharge: 2022-01-25 | Disposition: A | Discharge status: Home / Self Care | Payer: Medicaid Other | Source: Ambulatory Visit | Attending: Family Medicine | Admitting: Family Medicine

## 2022-01-25 DIAGNOSIS — R600 Localized edema: Secondary | ICD-10-CM | POA: Diagnosis present

## 2022-01-25 DIAGNOSIS — I10 Essential (primary) hypertension: Secondary | ICD-10-CM | POA: Diagnosis not present

## 2022-01-25 LAB — ECHOCARDIOGRAM COMPLETE
Area-P 1/2: 2.77 cm2
S' Lateral: 3.1 cm

## 2022-01-27 ENCOUNTER — Other Ambulatory Visit: Payer: Self-pay

## 2022-01-27 ENCOUNTER — Ambulatory Visit: Payer: Medicaid Other | Admitting: Student

## 2022-01-27 VITALS — BP 148/102 | HR 61 | Wt 339.6 lb

## 2022-01-27 DIAGNOSIS — G43919 Migraine, unspecified, intractable, without status migrainosus: Secondary | ICD-10-CM | POA: Insufficient documentation

## 2022-01-27 DIAGNOSIS — R600 Localized edema: Secondary | ICD-10-CM | POA: Diagnosis not present

## 2022-01-27 DIAGNOSIS — I10 Essential (primary) hypertension: Secondary | ICD-10-CM

## 2022-01-27 DIAGNOSIS — R519 Headache, unspecified: Secondary | ICD-10-CM | POA: Insufficient documentation

## 2022-01-27 MED ORDER — OLMESARTAN-AMLODIPINE-HCTZ 40-5-25 MG PO TABS: 1.0000 | ORAL_TABLET | Freq: Every day | ORAL | 1 refills | Status: DC

## 2022-01-27 MED ORDER — SUMATRIPTAN SUCCINATE 50 MG PO TABS: 50.0000 mg | ORAL_TABLET | ORAL | 0 refills | Status: DC | PRN

## 2022-01-27 MED ORDER — KETOROLAC TROMETHAMINE 30 MG/ML IJ SOLN
30.0000 mg | Freq: Once | INTRAMUSCULAR | Status: AC
  Administered 2022-01-27: 30 mg via INTRAMUSCULAR

## 2022-01-27 MED ORDER — SUMATRIPTAN SUCCINATE 6 MG/0.5ML ~~LOC~~ SOLN
6.0000 mg | Freq: Once | SUBCUTANEOUS | Status: AC
  Administered 2022-01-27: 6 mg via SUBCUTANEOUS

## 2022-01-27 MED ORDER — SPIRONOLACTONE 25 MG PO TABS: 25.0000 mg | ORAL_TABLET | Freq: Every day | ORAL | 3 refills | Status: DC

## 2022-01-27 NOTE — Assessment & Plan Note (Signed)
Unusual that she has not been on any migraine specific medications.  We will try to abort today's episode with a single dose of IM Toradol and some subcutaneous sumatriptan hand in clinic.  Would offer diphenhydramine and/or Compazine but patient needs to drive home and therefore do not want to give anything overly sedating.  Patient okay to take 50 mg of Benadryl upon returning home. Moderate improvement noted after administration of Toradol and sumatriptan.  Advised to avoid NSAIDs today given Toradol administration. -Will send in sumatriptan tabs, likely to need controller med in the future

## 2022-01-27 NOTE — Progress Notes (Signed)
    SUBJECTIVE:   CHIEF COMPLAINT / HPI:   Migraine Longstanding issue for her. Presents with severe headache, phono-photophobia and nausea.  Has been an issue since she was in middle school, recalls having to miss school for headaches back then.  Has had to call out of work for this present headache which has been present for several days now.  He is not currently on any migraine specific medications, just has been trying to manage symptoms conservatively but symptoms persist "no matter what I do."  Gets these headaches at least once per week.  Hypertension Has been difficult to control.  Previously had her on amlodipine 10 mg and losartan-HCTZ 100-25 mg tabs.  Reports excellent adherence to these medications.  Has been being worked up for lower extremity edema while on the 10 mg of amlodipine.  Echocardiogram last week was normal aside from mild dilatation of the left atrium.  Of note, her wife reports severe snoring and ?apneic episodes during sleep.   OBJECTIVE:   BP (!) 148/102   Pulse 61   Wt (!) 339 lb 9.6 oz (154 kg)   SpO2 100%   BMI 46.06 kg/m   Gen: Tired appearing, wearing sunglasses indoors Eyes: No papilledema Card: Regular rate and rhythm, without murmur, rub, or gallop Pulm: Normal WOB on RA, lungs are clear throughout Neuro: Gait normal, CN II-XII intact, strength and sensation intact throughout  ASSESSMENT/PLAN:   Intractable migraine without status migrainosus Unusual that she has not been on any migraine specific medications.  We will try to abort today's episode with a single dose of IM Toradol and some subcutaneous sumatriptan hand in clinic.  Would offer diphenhydramine and/or Compazine but patient needs to drive home and therefore do not want to give anything overly sedating.  Patient okay to take 50 mg of Benadryl upon returning home. Moderate improvement noted after administration of Toradol and sumatriptan.  Advised to avoid NSAIDs today given Toradol  administration. -Will send in sumatriptan tabs, likely to need controller med in the future  Primary hypertension To minimize pill burden, will transition her from amlodipine and losartan-HCTZ to olmesartan-amlodipine-HCTZ 40-5-25 mg combo pill.  While doing so we will dose adjust her amlodipine from 10 mg to 5 mg daily to try and address her lower extremity edema.  We will also add on spironolactone 25 mg daily.   -olmesartan-amlodipine-HCTZ 40-5-25 mg -Spironolactone 25 mg daily -Repeat BMP in 1 week -Urine protein creatinine ratio -Renal and aldosterone activity level -Follow-up in 3-4 weeks     [name], MD [place]

## 2022-01-27 NOTE — Patient Instructions (Addendum)
Tabitha Holland,  Several things we talked about today: Your blood pressure is still up despite our best efforts to get it down.  I am changing up your medications just a little bit by transitioning your 3 blood pressure medicines that you are now to a single pill that contains all 3.  I am then adding a new blood pressure medicine called spironolactone to your regimen.  The dosing instructions will say to take it at night but it does not matter what time of the day you take it. For your headache, we are giving you 2 shots today 1 of Toradol which is similar to ibuprofen, do not take any more ibuprofen today.  Once you get home, you may take 2 Benadryl tablets for a total of 50 mg of Benadryl.  The second medication is called sumatriptan, this is a an antimigraine medicine.  I am also sending in a prescription to your pharmacy for you to take in the future.  At the first sign of migraine, I want you to take 1 tablet.  If you are not feeling any better in 2 hours, please take your second tablet. I am also ordering a sleep study given your high blood pressure and chronic headaches and the report that you are snoring.  As with your blood pressure medicines previously, I will need to see you back in about a week to make sure that you are body is tolerating the new blood pressure medicine okay.  I have ordered this is a future lab order, just show up and let the front desk know that you are here for a lab draw.  [name], MD

## 2022-01-27 NOTE — Assessment & Plan Note (Addendum)
To minimize pill burden, will transition her from amlodipine and losartan-HCTZ to olmesartan-amlodipine-HCTZ 40-5-25 mg combo pill.  While doing so we will dose adjust her amlodipine from 10 mg to 5 mg daily to try and address her lower extremity edema.  We will also add on spironolactone 25 mg daily.   -olmesartan-amlodipine-HCTZ 40-5-25 mg -Spironolactone 25 mg daily -Repeat BMP in 1 week -Urine protein creatinine ratio -Renal and aldosterone activity level -Follow-up in 3-4 weeks

## 2022-01-28 LAB — PROTEIN / CREATININE RATIO, URINE
Creatinine, Urine: 73 mg/dL
Protein, Ur: 4.8 mg/dL
Protein/Creat Ratio: 66 mg/g creat (ref 0–200)

## 2022-01-31 ENCOUNTER — Ambulatory Visit (INDEPENDENT_AMBULATORY_CARE_PROVIDER_SITE_OTHER): Payer: Medicaid Other | Admitting: Family Medicine

## 2022-01-31 VITALS — BP 128/84 | HR 63 | Ht 72.0 in | Wt 334.2 lb

## 2022-01-31 DIAGNOSIS — G43919 Migraine, unspecified, intractable, without status migrainosus: Secondary | ICD-10-CM | POA: Diagnosis not present

## 2022-01-31 MED ORDER — KETOROLAC TROMETHAMINE 10 MG PO TABS: 10.0000 mg | ORAL_TABLET | Freq: Four times a day (QID) | ORAL | 0 refills | Status: AC | PRN

## 2022-01-31 NOTE — Progress Notes (Unsigned)
    SUBJECTIVE:   CHIEF COMPLAINT / HPI:   Migraine Patient here with persistent migraine.  States she has had migraines since she was a teenager.  Her current migraine has been present for the past 2 weeks.  Started gradually.  She was seen by her PCP 5 days ago and given IM Toradol and subcu sumatriptan.  Also took Benadryl when she got home, and has been taking oral sumatriptan since that time.  Despite these measures, her headache is still severe.  Described as generalized, involving her entire head.  Associated photophobia and nausea.  States it is imposing on her life as she is unable to function due to the headache.  No fever, neck stiffness, weakness, dizziness, congestion, or tinnitus.  She denies any recent stressors or major life changes.  Does have 7 kids at home.  PERTINENT  PMH / PSH: Hypertension, obesity  OBJECTIVE:   BP 128/84   Pulse 63   Ht 6' (1.829 m)   Wt (!) 334 lb 3.2 oz (151.6 kg)   LMP 01/20/2022   SpO2 98%   BMI 45.33 kg/m   Gen: Sitting in dark room with sunglasses on HEENT: [place]/AT, PERRLA, no appreciable papilledema, sinuses nontender, nares patent bilaterally, TM normal bilaterally, oropharynx unremarkable Neck: supple, FROM, no tenderness, no cervical or supraclavicular lymphadenopathy CV: RRR, normal S1/S2, no murmur Resp: Normal effort, lungs CTAB Extremities: no edema or cyanosis Skin: warm and dry, no rashes noted Neuro: alert, CN II-XII intact, 5/5 strength in all extremities, sensation intact to light touch throughout, normal gait, normal speech   ASSESSMENT/PLAN:   Intractable migraine without status migrainosus Patient with severe headache lasting 2 weeks.  There are migrainous components including photophobia, nausea, debilitating aspect.  However the location is generalized and has not responded to typical migraine therapy which is unusual.  Also interesting is that despite a longstanding history of migraine headaches, she has never been on  specific medication for this. No red flags on history or exam, therefore pain relief is our goal at this point.  Trial PO Toradol '10mg'$  q6h prn for 5 days.  ED/return precautions reviewed.  Follow-up with PCP regarding migraine prophylaxis in the future.     Alcus Dad, MD [place]

## 2022-01-31 NOTE — Patient Instructions (Addendum)
It was great to see you!  I'm sorry you're experiencing this terrible headache.  I have sent a course of Toradol to your pharmacy to take for the next 5 days. DO NOT take Ibuprofen/Advil/Motrin/Aleve while taking the Toradol.  You CAN take Tylenol in addition to the Toradol if needed.  This should improve with time. Try to rest, stay well hydrated, and limit stress when possible. See Dr. Joelyn Oms in ~2 weeks to follow up.   Feel better, Dr Rock Nephew

## 2022-02-01 NOTE — Assessment & Plan Note (Signed)
Patient with severe headache lasting 2 weeks.  There are migrainous components including photophobia, nausea, debilitating aspect.  However the location is generalized and has not responded to typical migraine therapy which is unusual.  Also interesting is that despite a longstanding history of migraine headaches, she has never been on specific medication for this. No red flags on history or exam, therefore pain relief is our goal at this point.  Trial PO Toradol '10mg'$  q6h prn for 5 days.  ED/return precautions reviewed.  Follow-up with PCP regarding migraine prophylaxis in the future.

## 2022-02-20 ENCOUNTER — Encounter: Payer: Self-pay | Admitting: Student

## 2022-02-20 ENCOUNTER — Other Ambulatory Visit: Payer: Self-pay

## 2022-02-20 ENCOUNTER — Emergency Department (HOSPITAL_COMMUNITY)
Admission: EM | Admit: 2022-02-20 | Discharge: 2022-02-20 | Discharge status: Against Medical Advice | Payer: Medicaid Other | Source: Home / Self Care

## 2022-02-21 ENCOUNTER — Ambulatory Visit (HOSPITAL_COMMUNITY)
Admission: EM | Admit: 2022-02-21 | Discharge: 2022-02-21 | Disposition: A | Discharge status: Home / Self Care | Payer: Medicaid Other | Attending: Emergency Medicine | Admitting: Emergency Medicine

## 2022-02-21 ENCOUNTER — Encounter (HOSPITAL_COMMUNITY): Payer: Self-pay | Admitting: *Deleted

## 2022-02-21 DIAGNOSIS — M62830 Muscle spasm of back: Secondary | ICD-10-CM | POA: Diagnosis not present

## 2022-02-21 DIAGNOSIS — M5431 Sciatica, right side: Secondary | ICD-10-CM

## 2022-02-21 MED ORDER — KETOROLAC TROMETHAMINE 30 MG/ML IJ SOLN
30.0000 mg | Freq: Once | INTRAMUSCULAR | Status: AC
  Administered 2022-02-21: 30 mg via INTRAMUSCULAR

## 2022-02-21 MED ORDER — METHOCARBAMOL 500 MG PO TABS: 500.0000 mg | ORAL_TABLET | Freq: Every evening | ORAL | 0 refills | Status: DC

## 2022-02-21 MED ORDER — KETOROLAC TROMETHAMINE 30 MG/ML IJ SOLN
INTRAMUSCULAR | Status: AC
  Filled 2022-02-21: qty 1

## 2022-02-21 NOTE — Discharge Instructions (Addendum)
Please go home and get some rest today.  I have sent Robaxin muscle relaxant to the pharmacy, least take 1 pill before bedtime, this medication can make you sleepy so do not operate a car or heavy machinery after taking this medication.   You can start taking Aleve tomorrow, you can take Aleve (500 mg) every 12 hours as needed.  Please be advised that it would be best to have food on your stomach when taking this medication due to potential stomach side effects as GI bleed.   As discussed, please continue to monitor your blood pressure at home and take blood pressure medication when you get back home.

## 2022-02-21 NOTE — ED Triage Notes (Signed)
Yesterday pt states she was standing and started back pain and she now feels a knot on his lower back near her spine. She had to slam on breaks driving last night and it made her back worse. She said she went to ER last night but there was a long wait so she left. She did go home and take a pain pill but had muscle spasms all night. She did have one muscle relaxer from last visit she took last night.    Pt has not taken any BP meds today.

## 2022-02-21 NOTE — ED Provider Notes (Signed)
[place] [facility]    CSN: [id number] Arrival date & time: 02/21/22  3810      History   Chief Complaint Chief Complaint  Patient presents with   Back Pain    HPI Tabitha Holland is a 36 y.o. female.  Patient complaining of bilateral lower back pain that started yesterday.  Patient reports onset of symptoms began while standing at her computer typing.  Patient denies any fall or trauma to the back.  Patient denies any change in bowel or bladder.  Patient reports it is worse on the right side and radiates down right leg.  Patient denies any numbness or tingling . Patient denies history of back surgery.  Patient reports taking 1 baclofen last night with minimal relief of symptoms.  Patient denies any history of sciatica.  Patient reports increased pain with ambulation.  Patient reports increased pain with sitting and raising from a chair.  Patient reports relief of pain when laying down in bed.    Back Pain Associated symptoms: no numbness     Past Medical History:  Diagnosis Date   Anxiety    Depression    Gallstones    History of anemia    per pt on 02/02/2021   History of suicidal ideation 10/02/2019   Hx of wisdom tooth extraction    Hypertension    Migraines    occasional per pt 02/02/2021   Morbid obesity [facility])     Patient Active Problem List   Diagnosis Date Noted   Intractable migraine without status migrainosus 01/27/2022   Chest pain 12/22/2021   Nausea 12/06/2021   Personal history of peptic ulcer disease 12/06/2021   Hepatomegaly 11/04/2021   Encounter to establish care with new doctor 11/04/2021   Submucous uterine fibroid 02/02/2021   Abnormal uterine bleeding 11/22/2020   Pelvic pain 11/22/2020   Previous cesarean section 05/26/2018   BMI 40.0-44.9, adult ([place]) 05/26/2018   Marijuana abuse 05/26/2018   Tobacco use 05/12/2013   Primary hypertension 05/12/2013    Past Surgical History:  Procedure Laterality Date   CESAREAN SECTION N/A  10/28/2013   Procedure: CESAREAN SECTION;  Surgeon: Emily Filbert, MD;  Location: [facility];  Service: Obstetrics;  Laterality: N/A;   CESAREAN SECTION N/A 09/15/2015   Procedure: CESAREAN SECTION;  Surgeon: Mora Bellman, MD;  Location: [facility];  Service: Obstetrics;  Laterality: N/A;   CESAREAN SECTION N/A 05/26/2018   Procedure: CESAREAN SECTION;  Surgeon: Woodroe Mode, MD;  Location: [place] [facility];  Service: Obstetrics;  Laterality: N/A;   CHOLECYSTECTOMY     DILATATION & CURETTAGE/HYSTEROSCOPY WITH MYOSURE N/A 02/09/2021   Procedure: DILATATION & CURETTAGE/HYSTEROSCOPY WITH MYOSURE;  Surgeon: [name], MD;  Location: [facility];  Service: Gynecology;  Laterality: N/A;  fibroid resection   WISDOM TOOTH EXTRACTION      OB History     Gravida  5   Para  5   Term  5   Preterm  0   AB  0   Living  5      SAB  0   IAB  0   Ectopic  0   Multiple  0   Live Births  5            Home Medications    Prior to Admission medications   Medication Sig Start Date End Date Taking? Authorizing Provider  baclofen (LIORESAL) 10 MG tablet Take 1 tablet (10 mg total) by mouth 2 (two)  times daily as needed for muscle spasms. 11/07/21  Yes Raspet, Erin K, PA-C  methocarbamol (ROBAXIN) 500 MG tablet Take 1 tablet (500 mg total) by mouth at bedtime. 02/21/22  Yes Bently Morath N, NP  Olmesartan-amLODIPine-HCTZ 40-5-25 MG TABS Take 1 tablet by mouth daily. 01/27/22  Yes Eppie Gibson, MD  spironolactone (ALDACTONE) 25 MG tablet Take 1 tablet (25 mg total) by mouth at bedtime. 01/27/22  Yes Eppie Gibson, MD  SUMAtriptan (IMITREX) 50 MG tablet Take 1 tablet (50 mg total) by mouth every 2 (two) hours as needed for migraine. May repeat in 2 hours if headache persists or recurs. 01/27/22  Yes Eppie Gibson, MD  dextromethorphan-guaiFENesin [facility] DM) 30-600 MG 12hr tablet Take 1 tablet by mouth 2 (two) times daily. 12/27/21   Nyoka Lint, PA-C   famotidine (PEPCID) 20 MG tablet Take 1 tablet (20 mg total) by mouth 2 (two) times daily. 12/08/21   Eppie Gibson, MD  ondansetron (ZOFRAN) 4 MG tablet Take 1 tablet (4 mg total) by mouth every 8 (eight) hours as needed for nausea or vomiting. 12/20/21   Eppie Gibson, MD  Ruxolitinib Phosphate (OPZELURA) 1.5 % CREA Apply 1 Application topically daily as needed (eczema).    [provider]  triamcinolone cream (KENALOG) 0.1 % Apply 1 Application topically 2 (two) times daily. 12/27/21   Nyoka Lint, PA-C    Family History Family History  Adopted: Yes  Problem Relation Age of Onset   Hypertension Sister    Bipolar disorder Brother    Schizophrenia Brother     Social History Social History   Tobacco Use   Smoking status: Some Days    Packs/day: 0.25    Types: Cigarettes   Smokeless tobacco: Never  Vaping Use   Vaping Use: Never used  Substance Use Topics   Alcohol use: Yes    Comment: 2-3 weekly   Drug use: Yes    Types: Marijuana     Allergies   Codeine, Fish allergy, Hydrocodone, Latex, Percocet [oxycodone-acetaminophen], and Shellfish allergy   Review of Systems Review of Systems  Constitutional:  Positive for activity change.  Musculoskeletal:  Positive for back pain (bilateral lower back) and gait problem.  Neurological:  Negative for numbness.     Physical Exam Triage Vital Signs ED Triage Vitals  Enc Vitals Group     BP      Pulse      Resp      Temp      Temp src      SpO2      Weight      Height      Head Circumference      Peak Flow      Pain Score      Pain Loc      Pain Edu?      Excl. in [place]?    No data found.  Updated Vital Signs BP (!) 164/122 (BP Location: Left Arm)   Pulse 98   Temp 98.3 F (36.8 C) (Oral)   Resp 18   LMP 02/16/2022 (Exact Date)   SpO2 98%     Physical Exam Vitals and nursing note reviewed.  Constitutional:      Appearance: Normal appearance.  Musculoskeletal:     Cervical back: Normal.      Thoracic back: Normal.     Lumbar back: Spasms and tenderness present. No swelling, edema, deformity, signs of trauma, lacerations or bony tenderness. Decreased range of motion.  Positive right straight leg raise test and positive left straight leg raise test. No scoliosis.  Neurological:     Mental Status: She is alert.  Psychiatric:        Behavior: Behavior is cooperative.      UC Treatments / Results  Labs (all labs ordered are listed, but only abnormal results are displayed) Labs Reviewed - No data to display  EKG   Radiology No results found.  Procedures Procedures (including critical care time)  Medications Ordered in UC Medications  ketorolac (TORADOL) 30 MG/ML injection 30 mg (30 mg Intramuscular Given 02/21/22 0926)    Initial Impression / Assessment and Plan / UC Course  I have reviewed the triage vital signs and the nursing notes.  Pertinent labs & imaging results that were available during my care of the patient were reviewed by me and considered in my medical decision making (see chart for details).     Per assessment and symptomology, patient is being treated for muscle spasm and sciatica.  Toradol injection was given in office.  Robaxin muscle relaxant was sent to the pharmacy to be taken at night.  Patient made aware of safety precautions with Robaxin.  Patient made aware to start Aleve anti-inflammatory tomorrow.  Patient given work note so that she can rest today.  Patient made aware she can also use a heat pad on back.  Patient advised to be careful with lifting and raising over the next couple days at work.  Patient made aware of red flag symptoms that would warrant an emergency department visit.  Patient verbalized understanding of instructions.   Patient made aware of blood pressure of 164/122.  Patient has history of hypertension . Patient states she was unable to take her blood pressure medication today but she will take it as soon as she gets back  home.  Due to symptomology blood pressure may be elevated due to pain.  Patient made aware of what a normal blood pressure reading should be.  Patient verbalized understanding and stated she has a blood pressure cuff to check blood pressure at home.  Patient states that she follows up with her PCP and blood pressures have been well managed.  Final Clinical Impressions(s) / UC Diagnoses   Final diagnoses:  Muscle spasm of back  Sciatica of right side     Discharge Instructions      Please go home and get some rest today.  I have sent Robaxin muscle relaxant to the pharmacy, least take 1 pill before bedtime, this medication can make you sleepy so do not operate a car or heavy machinery after taking this medication.   You can start taking Aleve tomorrow, you can take Aleve (500 mg) every 12 hours as needed.  Please be advised that it would be best to have food on your stomach when taking this medication due to potential stomach side effects as GI bleed.   As discussed, please continue to monitor your blood pressure at home and take blood pressure medication when you get back home.      ED Prescriptions     Medication Sig Dispense Auth. Provider   methocarbamol (ROBAXIN) 500 MG tablet Take 1 tablet (500 mg total) by mouth at bedtime. 3 tablet Flossie Dibble, NP      PDMP not reviewed this encounter.   Flossie Dibble, NP 02/21/22 [id number]

## 2022-03-03 ENCOUNTER — Ambulatory Visit (HOSPITAL_BASED_OUTPATIENT_CLINIC_OR_DEPARTMENT_OTHER): Payer: Medicaid Other | Attending: Family Medicine | Admitting: Internal Medicine

## 2022-03-03 ENCOUNTER — Ambulatory Visit (INDEPENDENT_AMBULATORY_CARE_PROVIDER_SITE_OTHER): Payer: Medicaid Other | Admitting: Student

## 2022-03-03 VITALS — BP 160/100 | HR 59 | Wt 354.0 lb

## 2022-03-03 VITALS — Ht 72.0 in | Wt 354.0 lb

## 2022-03-03 DIAGNOSIS — Z6841 Body Mass Index (BMI) 40.0 and over, adult: Secondary | ICD-10-CM | POA: Diagnosis not present

## 2022-03-03 DIAGNOSIS — R0683 Snoring: Secondary | ICD-10-CM | POA: Diagnosis present

## 2022-03-03 DIAGNOSIS — E669 Obesity, unspecified: Secondary | ICD-10-CM | POA: Insufficient documentation

## 2022-03-03 DIAGNOSIS — I10 Essential (primary) hypertension: Secondary | ICD-10-CM | POA: Diagnosis present

## 2022-03-03 DIAGNOSIS — R5383 Other fatigue: Secondary | ICD-10-CM | POA: Diagnosis not present

## 2022-03-03 DIAGNOSIS — Z720 Tobacco use: Secondary | ICD-10-CM | POA: Diagnosis not present

## 2022-03-03 DIAGNOSIS — G43919 Migraine, unspecified, intractable, without status migrainosus: Secondary | ICD-10-CM

## 2022-03-03 MED ORDER — SPIRONOLACTONE 50 MG PO TABS: 50.0000 mg | ORAL_TABLET | Freq: Every day | ORAL | 1 refills | Status: DC

## 2022-03-03 NOTE — Patient Instructions (Signed)
Tabitha Holland,  Your BP is too high. I don't like it. Let's bump up your spironolactone to '50mg'$ /day. We will recheck your kidneys when you see Dr. Valentina Lucks in a few weeks to make sure you're tolerating okay.   I am also sending you to the hypertension clinic for some specialist services.   We are also sending you to bariatrics to discuss possible surgeries.   Come back to see me sometime around Christmas.   [name], MD

## 2022-03-03 NOTE — Assessment & Plan Note (Signed)
Will refer to bariatric surgery. Recognize she will need to complete education before being a candidate for surgery, but I think this may be the best therapeutic fit for her. I do believe she has the motivation and family support to be successful.

## 2022-03-03 NOTE — Assessment & Plan Note (Signed)
Resistant HTN despite four agents. Could increase amlodipine or HCTZ components of her combo pill but worry about edema with the amlodipine and lab abnormalities with the HCTZ. Favor going up on the [facility] as she is on a relatively low dose. I believe she warrants specialist involvement to get better control of her BP. - Continue olmesartan-amlodipine-HCTZ 40-5-'25mg'$  daily - Increase Spironolactone to '50mg'$  daily - Sleep study this evening - Stop smoking - Repeat BMP when she comes for smoking cessation visit on 11/30

## 2022-03-03 NOTE — Progress Notes (Signed)
    SUBJECTIVE:   CHIEF COMPLAINT / HPI:   Hypertension Here for HTN follow-up. Patient has been adherent to her current regimen of Olmesartan-amlodipine-HCTZ 40-5-25 mg daily and Spironolactone '25mg'$  daily. Feels generally well. No issues with medication. Has been checking her BP at work and has noticed it is routinely elevated, but has been checking with a wrist cuff. Has attemped a number of dietary interventions in her pursuit of weight loss as well, including cutting back on sweets and sugar-sweetened beverages. She does continue to smoke, though now voices a desire to quit.  Of note, she actually has a sleep study this evening for presumed OSA which is likely a contributing factor.   BMI 48 Voices frustration at challenges in losing weight despite attempts at dietary/lifestyle changes. We had previously discussed trial of a GLP-1 agonist but she deals with chronic nausea, so we have made the mutual decision that this would be an imperfect therapy for her. Today she voices an interest in bariatric surgery as an option.   Tobacco Use Patient is an everyday smoker but voices a desire to quit. She is accompanied by her wife who is also an every day smoker and the two are interested in quitting together.  OBJECTIVE:   BP (!) 160/100   Pulse (!) 59   Wt (!) 354 lb (160.6 kg)   LMP 02/16/2022 (Exact Date)   BMI 48.01 kg/m   Gen: In good spirits Cardio: Regular rate and rhythm, no murmur Pulm: Normal WOB on RA without wheezes, rales, or rhonchi Abdomen: Obese, non-tender, non-distended Ext: Minimal LE edema, improved from my previous exams Neuro: Without focal deficits  ASSESSMENT/PLAN:   Primary hypertension Resistant HTN despite four agents. Could increase amlodipine or HCTZ components of her combo pill but worry about edema with the amlodipine and lab abnormalities with the HCTZ. Favor going up on the [place] as she is on a relatively low dose. I believe she warrants specialist  involvement to get better control of her BP. - Continue olmesartan-amlodipine-HCTZ 40-5-'25mg'$  daily - Increase Spironolactone to '50mg'$  daily - Sleep study this evening - Stop smoking - Repeat BMP when she comes for smoking cessation visit on 11/30  BMI 45.0-49.9, adult [facility]) Will refer to bariatric surgery. Recognize she will need to complete education before being a candidate for surgery, but I think this may be the best therapeutic fit for her. I do believe she has the motivation and family support to be successful.   Tobacco use Considering quitting. Has a supportive partner who is likewise attempting to quit. Will have them come back to see Dr. Valentina Lucks to work together toward quitting.      [name], MD [place]

## 2022-03-03 NOTE — Assessment & Plan Note (Signed)
Considering quitting. Has a supportive partner who is likewise attempting to quit. Will have them come back to see Dr. Valentina Lucks to work together toward quitting.

## 2022-03-06 ENCOUNTER — Encounter (HOSPITAL_BASED_OUTPATIENT_CLINIC_OR_DEPARTMENT_OTHER): Payer: Medicaid Other | Admitting: Internal Medicine

## 2022-03-11 DIAGNOSIS — I10 Essential (primary) hypertension: Secondary | ICD-10-CM

## 2022-03-11 NOTE — Procedures (Signed)
    Patient Name: Tabitha Holland, Tabitha Holland Date: 03/03/2022 Gender: Female D.O.B: 1985-05-12 Age (years): 36 Referring Provider: Andrena Mews Height (inches): 72 Interpreting Physician: Baird Lyons MD, ABSM Weight (lbs): 354 RPSGT: Jorge Ny BMI: 48 MRN: [id number] Neck Size: 15.75  CLINICAL INFORMATION Sleep Study Type: NPSG Indication for sleep study: Fatigue, Morbid Obesity, Snoring Epworth Sleepiness Score: 3  SLEEP STUDY TECHNIQUE As per the AASM Manual for the Scoring of Sleep and Associated Events v2.3 (April 2016) with a hypopnea requiring 4% desaturations.  The channels recorded and monitored were frontal, central and occipital EEG, electrooculogram (EOG), submentalis EMG (chin), nasal and oral airflow, thoracic and abdominal wall motion, anterior tibialis EMG, snore microphone, electrocardiogram, and pulse oximetry.  MEDICATIONS Medications self-administered by patient taken the night of the study : N/A  SLEEP ARCHITECTURE The study was initiated at 10:45:18 PM and ended at 5:00:39 AM.  Sleep onset time was 19.7 minutes and the sleep efficiency was 92.6%%. The total sleep time was 347.5 minutes.  Stage REM latency was 77.0 minutes.  The patient spent 1.9%% of the night in stage N1 sleep, 71.4%% in stage N2 sleep, 0.0%% in stage N3 and 26.8% in REM.  Alpha intrusion was absent.  Supine sleep was 12.45%.  RESPIRATORY PARAMETERS The overall apnea/hypopnea index (AHI) was 1.0 per hour. There were 0 total apneas, including 0 obstructive, 0 central and 0 mixed apneas. There were 6 hypopneas and 1 RERAs.  The AHI during Stage REM sleep was 3.9 per hour.  AHI while supine was 0.0 per hour.  The mean oxygen saturation was 95.1%. The minimum SpO2 during sleep was 90.0%.  soft snoring was noted during this study.  CARDIAC DATA The 2 lead EKG demonstrated sinus rhythm. The mean heart rate was 62.1 beats per minute. Other EKG findings include: None.  LEG  MOVEMENT DATA The total PLMS were 0 with a resulting PLMS index of 0.0. Associated arousal with leg movement index was 0.0 .  IMPRESSIONS - No significant obstructive sleep apnea occurred during this study (AHI = 1.0/h). - The patient had minimal or no oxygen desaturation during the study (Min O2 = 90.0%) - The patient snored with soft snoring volume. - No cardiac abnormalities were noted during this study. - Clinically significant periodic limb movements did not occur during sleep. No significant associated arousals.  DIAGNOSIS - Normal Study - Primary Snoring  RECOMMENDATIONS - Manage for symptoms and snoring based on clinical judgment. - Sleep hygiene should be reviewed to assess factors that may improve sleep quality. - Weight management and regular exercise should be initiated or continued if appropriate.  [Electronically signed] 03/11/2022 11:00 AM  Baird Lyons MD, ABSM Diplomate, American Board of Sleep Medicine NPI: [id number]                         [place] [place] [facility]  ELECTRONICALLY SIGNED ON:  03/11/2022, 10:58 AM [place] PH: [phone]   FX: [phone] [place]

## 2022-03-17 ENCOUNTER — Ambulatory Visit: Payer: Medicaid Other | Admitting: Student

## 2022-03-21 ENCOUNTER — Encounter: Payer: Self-pay | Admitting: Student

## 2022-03-21 DIAGNOSIS — G8929 Other chronic pain: Secondary | ICD-10-CM

## 2022-03-21 MED ORDER — METHOCARBAMOL 500 MG PO TABS: 500.0000 mg | ORAL_TABLET | Freq: Every evening | ORAL | 0 refills | Status: DC

## 2022-03-26 ENCOUNTER — Other Ambulatory Visit: Payer: Self-pay

## 2022-03-26 ENCOUNTER — Emergency Department (HOSPITAL_COMMUNITY)
Admission: EM | Admit: 2022-03-26 | Discharge: 2022-03-26 | Discharge status: Against Medical Advice | Payer: Medicaid Other | Attending: Emergency Medicine | Admitting: Emergency Medicine

## 2022-03-26 ENCOUNTER — Encounter (HOSPITAL_COMMUNITY): Payer: Self-pay

## 2022-03-26 DIAGNOSIS — M545 Low back pain, unspecified: Secondary | ICD-10-CM | POA: Diagnosis present

## 2022-03-26 DIAGNOSIS — Z5321 Procedure and treatment not carried out due to patient leaving prior to being seen by health care provider: Secondary | ICD-10-CM | POA: Insufficient documentation

## 2022-03-26 NOTE — ED Triage Notes (Signed)
Patient with left hip pain x 1 week after bending down at work, has hx of sciatica. Has taken mobic x 3 days with no relief. With any bending or lifting of left leg she continues to have the severe pain, states that she feels as if her hip is hot

## 2022-03-26 NOTE — ED Notes (Signed)
Pt came to this NT stated that it feels worse sitting in the chair than laying down in bed at home. Pt left and then came back and stated that pt will try to get an appointment tomorrow with PCP and this NT stated if anything changes that pt can come back to ED. Pt acknowledge. Pt seen leaving WR.

## 2022-03-26 NOTE — ED Provider Triage Note (Signed)
Emergency Medicine Provider Triage Evaluation Note  Tabitha Holland , a 36 y.o. female  was evaluated in triage.  Pt complains of lower back pain.  Week ago.  Pain is located in the left lower back at the top of the buttock and at times radiates down her left leg to his her knee.  The pain is sharp.  She does have a recent history of sciatica in her right back was treated with methocarbamol which improved her symptoms.  She was evaluated for the symptoms in her left back on Monday restarted on methocarbamol which has not helped her symptoms at all.  She is also taken Tylenol ibuprofen.  Denies saddle anesthesia, urinary or bowel continence, and fever and trauma.  Review of Systems  Positive: See above Negative: See above  Physical Exam  BP (!) 180/126   Pulse 67   Temp 98 F (36.7 C)   Resp 16   SpO2 100%  Gen:   Awake, no distress   Resp:  Normal effort  MSK:   Moves extremities without difficulty  Other:    Medical Decision Making  Medically screening exam initiated at 11:24 AM.  Appropriate orders placed.  [name] was informed that the remainder of the evaluation will be completed by another provider, this initial triage assessment does not replace that evaluation, and the importance of remaining in the ED until their evaluation is complete.  Work up initiated   Harriet Pho, PA-C 03/26/22 1134

## 2022-03-27 ENCOUNTER — Other Ambulatory Visit: Payer: Self-pay

## 2022-03-27 ENCOUNTER — Telehealth: Payer: Self-pay

## 2022-03-27 DIAGNOSIS — R1010 Upper abdominal pain, unspecified: Secondary | ICD-10-CM

## 2022-03-27 DIAGNOSIS — R160 Hepatomegaly, not elsewhere classified: Secondary | ICD-10-CM

## 2022-03-27 NOTE — Telephone Encounter (Signed)
Left detailed message on pt's voicemail letting her know about repeat lab work that is due. Order placed for repeat LFTs.

## 2022-03-27 NOTE — Telephone Encounter (Signed)
-----   Message from Marice Potter, RN sent at 12/22/2021  8:50 AM EDT ----- Pt needs LFTs. Need to enter order. See 7/21 hepatitis surface antibody result note.

## 2022-03-30 ENCOUNTER — Ambulatory Visit: Payer: Medicaid Other | Admitting: Pharmacist

## 2022-03-30 ENCOUNTER — Other Ambulatory Visit: Payer: Self-pay

## 2022-03-31 ENCOUNTER — Other Ambulatory Visit: Payer: Medicaid Other

## 2022-03-31 DIAGNOSIS — I10 Essential (primary) hypertension: Secondary | ICD-10-CM

## 2022-03-31 DIAGNOSIS — R1010 Upper abdominal pain, unspecified: Secondary | ICD-10-CM

## 2022-03-31 DIAGNOSIS — R160 Hepatomegaly, not elsewhere classified: Secondary | ICD-10-CM

## 2022-04-01 LAB — BASIC METABOLIC PANEL
BUN/Creatinine Ratio: 14 (ref 9–23)
BUN: 10 mg/dL (ref 6–20)
CO2: 24 mmol/L (ref 20–29)
Calcium: 8.7 mg/dL (ref 8.7–10.2)
Chloride: 106 mmol/L (ref 96–106)
Creatinine, Ser: 0.71 mg/dL (ref 0.57–1.00)
Glucose: 79 mg/dL (ref 70–99)
Potassium: 4 mmol/L (ref 3.5–5.2)
Sodium: 140 mmol/L (ref 134–144)
eGFR: 114 mL/min/{1.73_m2} (ref >59)

## 2022-04-12 ENCOUNTER — Encounter: Payer: Self-pay | Admitting: Cardiovascular Disease

## 2022-04-19 ENCOUNTER — Other Ambulatory Visit: Payer: Self-pay

## 2022-04-19 DIAGNOSIS — R160 Hepatomegaly, not elsewhere classified: Secondary | ICD-10-CM

## 2022-04-19 DIAGNOSIS — R1010 Upper abdominal pain, unspecified: Secondary | ICD-10-CM

## 2022-04-19 NOTE — Telephone Encounter (Signed)
Received call from lab stating that it looks like pt's lab had been released from another location. Hepatic function panel reordered.

## 2022-05-14 ENCOUNTER — Emergency Department (HOSPITAL_COMMUNITY)
Admission: EM | Admit: 2022-05-14 | Discharge: 2022-05-14 | Discharge status: Against Medical Advice | Payer: Medicaid Other | Attending: Emergency Medicine | Admitting: Emergency Medicine

## 2022-05-14 ENCOUNTER — Other Ambulatory Visit: Payer: Self-pay

## 2022-05-14 DIAGNOSIS — R519 Headache, unspecified: Secondary | ICD-10-CM | POA: Insufficient documentation

## 2022-05-14 DIAGNOSIS — H53149 Visual discomfort, unspecified: Secondary | ICD-10-CM | POA: Insufficient documentation

## 2022-05-14 DIAGNOSIS — Z5321 Procedure and treatment not carried out due to patient leaving prior to being seen by health care provider: Secondary | ICD-10-CM | POA: Insufficient documentation

## 2022-05-14 DIAGNOSIS — R1110 Vomiting, unspecified: Secondary | ICD-10-CM | POA: Diagnosis not present

## 2022-05-14 NOTE — ED Triage Notes (Signed)
Pt in with headache x 4 days, reports some blurred vision and photophobia. Also reports emesis x 1 PTA.   VS en route: 143/73 86 98% 18

## 2022-05-14 NOTE — ED Notes (Signed)
Pt seen leaving ED after talking to [facility] from [facility].

## 2022-05-14 NOTE — ED Provider Triage Note (Signed)
Emergency Medicine Provider Triage Evaluation Note  LORANA MAFFEO , a 37 y.o. female  was evaluated in triage.  Pt complains of headache x4 days, + photophobia, vomited x1.  Admits to headaches when on her menses but worse today than usual and just could not take the pain.  Took toradol, tyelnol, and benadryl PTA but vomited it back up.  Review of Systems  Positive: headache Negative: fever  Physical Exam  BP (!) 152/100 (BP Location: Right Arm)   Pulse 79   Temp 98.9 F (37.2 C) (Oral)   Resp 20   Wt (!) 160.6 kg   SpO2 100%   BMI 48.02 kg/m   Gen:   Awake, no distress   Resp:  Normal effort  MSK:   Moves extremities without difficulty  Other:  AAOx3, answering questions and following commands, moving extremities well, no focal deficits  Medical Decision Making  Medically screening exam initiated at 4:04 AM.  Appropriate orders placed.  [name] was informed that the remainder of the evaluation will be completed by another provider, this initial triage assessment does not replace that evaluation, and the importance of remaining in the ED until their evaluation is complete.  Headache.  Hx of same when on menses, worse today.  AAOx3, no focal deficits.     Larene Pickett, PA-C 05/14/22 0425

## 2022-05-14 NOTE — ED Notes (Signed)
Pt stated she was leaving AMA

## 2022-05-15 ENCOUNTER — Emergency Department (HOSPITAL_BASED_OUTPATIENT_CLINIC_OR_DEPARTMENT_OTHER): Payer: Medicaid Other

## 2022-05-15 ENCOUNTER — Emergency Department (HOSPITAL_BASED_OUTPATIENT_CLINIC_OR_DEPARTMENT_OTHER)
Admission: EM | Admit: 2022-05-15 | Discharge: 2022-05-15 | Disposition: A | Discharge status: Home / Self Care | Payer: Medicaid Other | Attending: Emergency Medicine | Admitting: Emergency Medicine

## 2022-05-15 ENCOUNTER — Encounter (HOSPITAL_BASED_OUTPATIENT_CLINIC_OR_DEPARTMENT_OTHER): Payer: Self-pay | Admitting: Emergency Medicine

## 2022-05-15 ENCOUNTER — Other Ambulatory Visit: Payer: Self-pay

## 2022-05-15 ENCOUNTER — Encounter (HOSPITAL_COMMUNITY): Payer: Self-pay

## 2022-05-15 ENCOUNTER — Ambulatory Visit (HOSPITAL_COMMUNITY)
Admission: EM | Admit: 2022-05-15 | Discharge: 2022-05-15 | Disposition: A | Discharge status: Home / Self Care | Payer: Medicaid Other | Attending: Family Medicine | Admitting: Family Medicine

## 2022-05-15 DIAGNOSIS — R519 Headache, unspecified: Secondary | ICD-10-CM

## 2022-05-15 DIAGNOSIS — H538 Other visual disturbances: Secondary | ICD-10-CM | POA: Diagnosis not present

## 2022-05-15 DIAGNOSIS — Z91040 Latex allergy status: Secondary | ICD-10-CM | POA: Insufficient documentation

## 2022-05-15 DIAGNOSIS — I10 Essential (primary) hypertension: Secondary | ICD-10-CM

## 2022-05-15 DIAGNOSIS — R110 Nausea: Secondary | ICD-10-CM

## 2022-05-15 DIAGNOSIS — R1110 Vomiting, unspecified: Secondary | ICD-10-CM | POA: Insufficient documentation

## 2022-05-15 DIAGNOSIS — R030 Elevated blood-pressure reading, without diagnosis of hypertension: Secondary | ICD-10-CM

## 2022-05-15 DIAGNOSIS — D72829 Elevated white blood cell count, unspecified: Secondary | ICD-10-CM | POA: Insufficient documentation

## 2022-05-15 LAB — BASIC METABOLIC PANEL
Anion gap: 9 (ref 5–15)
BUN: 13 mg/dL (ref 6–20)
CO2: 27 mmol/L (ref 22–32)
Calcium: 9.4 mg/dL (ref 8.9–10.3)
Chloride: 101 mmol/L (ref 98–111)
Creatinine, Ser: 0.84 mg/dL (ref 0.44–1.00)
GFR, Estimated: >60 mL/min (ref >60)
Glucose, Bld: 107 mg/dL — ABNORMAL HIGH (ref 70–99)
Potassium: 4.2 mmol/L (ref 3.5–5.1)
Sodium: 137 mmol/L (ref 135–145)

## 2022-05-15 LAB — CBC WITH DIFFERENTIAL/PLATELET
Abs Immature Granulocytes: 0.04 10*3/uL (ref 0.00–0.07)
Basophils Absolute: 0 10*3/uL (ref 0.0–0.1)
Basophils Relative: 0 %
Eosinophils Absolute: 0 10*3/uL (ref 0.0–0.5)
Eosinophils Relative: 0 %
HCT: 37.2 % (ref 36.0–46.0)
Hemoglobin: 12.5 g/dL (ref 12.0–15.0)
Immature Granulocytes: 0 %
Lymphocytes Relative: 9 %
Lymphs Abs: 1 10*3/uL (ref 0.7–4.0)
MCH: 28.5 pg (ref 26.0–34.0)
MCHC: 33.6 g/dL (ref 30.0–36.0)
MCV: 84.7 fL (ref 80.0–100.0)
Monocytes Absolute: 0.1 10*3/uL (ref 0.1–1.0)
Monocytes Relative: 1 %
Neutro Abs: 10.2 10*3/uL — ABNORMAL HIGH (ref 1.7–7.7)
Neutrophils Relative %: 90 %
Platelets: 214 10*3/uL (ref 150–400)
RBC: 4.39 MIL/uL (ref 3.87–5.11)
RDW: 13.2 % (ref 11.5–15.5)
WBC: 11.4 10*3/uL — ABNORMAL HIGH (ref 4.0–10.5)
nRBC: 0 % (ref 0.0–0.2)

## 2022-05-15 LAB — URINALYSIS, ROUTINE W REFLEX MICROSCOPIC
Bacteria, UA: NONE SEEN
Bilirubin Urine: NEGATIVE
Glucose, UA: NEGATIVE mg/dL
Ketones, ur: 15 mg/dL — AB
Leukocytes,Ua: NEGATIVE
Nitrite: NEGATIVE
Specific Gravity, Urine: 1.025 (ref 1.005–1.030)
pH: 6.5 (ref 5.0–8.0)

## 2022-05-15 MED ORDER — DIPHENHYDRAMINE HCL 50 MG/ML IJ SOLN
25.0000 mg | Freq: Once | INTRAMUSCULAR | Status: AC
  Administered 2022-05-15: 25 mg via INTRAVENOUS
  Filled 2022-05-15: qty 1

## 2022-05-15 MED ORDER — SODIUM CHLORIDE 0.9 % IV BOLUS
1000.0000 mL | Freq: Once | INTRAVENOUS | Status: AC
  Administered 2022-05-15: 1000 mL via INTRAVENOUS

## 2022-05-15 MED ORDER — ACETAMINOPHEN 500 MG PO TABS
1000.0000 mg | ORAL_TABLET | Freq: Once | ORAL | Status: AC
  Administered 2022-05-15: 1000 mg via ORAL
  Filled 2022-05-15: qty 2

## 2022-05-15 MED ORDER — METOCLOPRAMIDE HCL 5 MG/ML IJ SOLN
5.0000 mg | Freq: Once | INTRAMUSCULAR | Status: AC
Start: 2022-05-15 — End: 2022-05-15
  Administered 2022-05-15: 5 mg via INTRAMUSCULAR

## 2022-05-15 MED ORDER — CLONIDINE HCL 0.1 MG PO TABS
0.1000 mg | ORAL_TABLET | Freq: Once | ORAL | Status: AC
  Administered 2022-05-15: 0.1 mg via ORAL

## 2022-05-15 MED ORDER — KETOROLAC TROMETHAMINE 60 MG/2ML IM SOLN
INTRAMUSCULAR | Status: AC
  Filled 2022-05-15: qty 2

## 2022-05-15 MED ORDER — METOCLOPRAMIDE HCL 5 MG/ML IJ SOLN
5.0000 mg | Freq: Once | INTRAMUSCULAR | Status: AC
  Administered 2022-05-15: 5 mg via INTRAVENOUS
  Filled 2022-05-15: qty 2

## 2022-05-15 MED ORDER — DEXAMETHASONE SODIUM PHOSPHATE 10 MG/ML IJ SOLN
INTRAMUSCULAR | Status: AC
  Filled 2022-05-15: qty 1

## 2022-05-15 MED ORDER — ONDANSETRON 4 MG PO TBDP
ORAL_TABLET | ORAL | Status: AC
  Filled 2022-05-15: qty 1

## 2022-05-15 MED ORDER — DEXAMETHASONE SODIUM PHOSPHATE 10 MG/ML IJ SOLN
10.0000 mg | Freq: Once | INTRAMUSCULAR | Status: AC
  Administered 2022-05-15: 10 mg via INTRAMUSCULAR

## 2022-05-15 MED ORDER — METOCLOPRAMIDE HCL 5 MG/ML IJ SOLN
INTRAMUSCULAR | Status: AC
  Filled 2022-05-15: qty 2

## 2022-05-15 MED ORDER — KETOROLAC TROMETHAMINE 60 MG/2ML IM SOLN
60.0000 mg | Freq: Once | INTRAMUSCULAR | Status: AC
  Administered 2022-05-15: 60 mg via INTRAMUSCULAR

## 2022-05-15 MED ORDER — ONDANSETRON HCL 4 MG PO TABS: 4.0000 mg | ORAL_TABLET | Freq: Three times a day (TID) | ORAL | 0 refills | Status: DC | PRN

## 2022-05-15 MED ORDER — CLONIDINE HCL 0.1 MG PO TABS
ORAL_TABLET | ORAL | Status: AC
  Filled 2022-05-15: qty 1

## 2022-05-15 MED ORDER — ONDANSETRON 4 MG PO TBDP
4.0000 mg | ORAL_TABLET | Freq: Once | ORAL | Status: AC
  Administered 2022-05-15: 4 mg via ORAL

## 2022-05-15 NOTE — Discharge Instructions (Signed)
It was a pleasure taking care of you today!  Your CT scan did not show any emergent concerning findings tonight.  Your labs did not show any emergent concerning findings tonight.  You will be sent a prescription for Zofran, take as directed if you are experiencing nausea or vomiting.  Attached is a referral to neurology regarding her headaches. You may follow-up with your primary care provider as needed.  Return to the emergency department if you experience increasing/worsening symptoms.

## 2022-05-15 NOTE — ED Triage Notes (Signed)
Patient reports that she began having a headache 6 days ago and was treating with Tylenol. Patient states 4 days ago her symptoms worsened which include left eye pain and vomiting.  Patient states she has not taken her BP meds in 3 days due to vomiting.

## 2022-05-15 NOTE — ED Notes (Signed)
All appropriate discharge materials reviewed at length with patient. Time for questions provided. Pt has no other questions at this time and verbalizes understanding of all provided materials.  

## 2022-05-15 NOTE — ED Notes (Signed)
Patient ambulated to bathroom with a steady gait. Denies dizziness. Stated "I feel a lot better and I am ready to go" PA-C has been notified.

## 2022-05-15 NOTE — ED Provider Notes (Signed)
[place]   [id number] 05/15/22 Arrival Time: 1102  ASSESSMENT & PLAN:  1. Bad headache   2. Elevated blood pressure reading in office with diagnosis of hypertension    Meds ordered this encounter  Medications   metoCLOPramide (REGLAN) injection 5 mg   dexamethasone (DECADRON) injection 10 mg   ketorolac (TORADOL) injection 60 mg   ondansetron (ZOFRAN-ODT) disintegrating tablet 4 mg   cloNIDine (CATAPRES) tablet 0.1 mg   After a long discussion regarding her current headache, she would like a trial of the above before possible need for ED evaluation. Without neurological abnormalities on exam. If she responds well to the above and her BP comes down, she will be discharge for home observation x 24 hours. If she does not respond well, she understands that she will need to proceed to the ED for further evaluation.  Reviewed expectations re: course of current medical issues. Questions answered. Outlined signs and symptoms indicating need for more acute intervention. Patient verbalized understanding. After Visit Summary given.   SUBJECTIVE: History from: Patient. Patient is able to give a clear and coherent history.  Tabitha Holland is a 37 y.o. female who presents with complaint of a bad headache. Does have h/o headaches that are similar to current headache but reports this headache is much worse. Gradual onset 5-6 days ago; now constant; mostly located behind L eye. Initially with eye discomfort but none now. Significant photophobia. No specific visual changes reported; blurry vision when HA started. With associated sporadic emesis. Hasn't been able to take BP meds secondary to n/v. Is ambulatory here without difficulty. Requests dark room. Denies head trauma. No tx PTA. Afebrile. No recent illnesses. Denies dizziness, loss of balance, muscle weakness, numbness of extremities, and speech difficulties. No recent travel.   OBJECTIVE:  Vitals:   05/15/22 1252  05/15/22 1255  BP: (!) 155/113 (!) 162/120  Pulse: 70   Temp: 98.3 F (36.8 C)   TempSrc: Oral   SpO2: 95%     General appearance: alert; fatigued-appearing HENT: normocephalic; atraumatic Eyes: PERRLA; EOMI; conjunctivae normal Neck: supple with FROM Lungs: unlabored respirations Extremities: no edema; symmetrical with no gross deformities Skin: warm and dry Neurologic: alert; speech is fluent and clear without dysarthria or aphasia; CN 2-12 grossly intact; no facial droop; normal gait; normal symmetric reflexes; normal extremity strength and sensation throughout; bilateral upper and lower extremity sensation is grossly intact with 5/5 symmetric strength; normal grip strength Psychological: alert and cooperative; normal mood and affect  Allergies  Allergen Reactions   Codeine Other (See Comments)    Migraines    Fish Allergy Itching   Hydrocodone Other (See Comments)    Migraines    Latex Itching   Percocet [Oxycodone-Acetaminophen] Other (See Comments)    Migraines    Shellfish Allergy Itching    Past Medical History:  Diagnosis Date   Anxiety    Depression    Gallstones    History of anemia    per pt on 02/02/2021   History of suicidal ideation 10/02/2019   Hx of wisdom tooth extraction    Hypertension    Migraines    occasional per pt 02/02/2021   Morbid obesity ([place])    Social History   Socioeconomic History   Marital status: Single    Spouse name: Not on file   Number of children: 3   Years of education: Not on file   Highest education level: Not on file  Occupational History   Occupation: Camera operator  Assistant  Tobacco Use   Smoking status: Some Days    Packs/day: 0.25    Types: Cigarettes   Smokeless tobacco: Never  Vaping Use   Vaping Use: Never used  Substance and Sexual Activity   Alcohol use: Yes    Comment: 2-3 weekly   Drug use: Not Currently    Types: Marijuana   Sexual activity: Not Currently    Birth control/protection: None  Other  Topics Concern   Not on file  Social History Narrative   Not on file   Social Determinants of Health   Financial Resource Strain: Not on file  Food Insecurity: No Food Insecurity (11/22/2020)   Hunger Vital Sign    Worried About Running Out of Food in the Last Year: Never true    Ran Out of Food in the Last Year: Never true  Transportation Needs: No Transportation Needs (11/22/2020)   PRAPARE - Hydrologist (Medical): No    Lack of Transportation (Non-Medical): No  Physical Activity: Not on file  Stress: Not on file  Social Connections: Not on file  Intimate Partner Violence: Not on file   Family History  Adopted: Yes  Problem Relation Age of Onset   Hypertension Sister    Bipolar disorder Brother    Schizophrenia Brother    Past Surgical History:  Procedure Laterality Date   CESAREAN SECTION N/A 10/28/2013   Procedure: CESAREAN SECTION;  Surgeon: Emily Filbert, MD;  Location: [facility];  Service: Obstetrics;  Laterality: N/A;   CESAREAN SECTION N/A 09/15/2015   Procedure: CESAREAN SECTION;  Surgeon: Mora Bellman, MD;  Location: [facility];  Service: Obstetrics;  Laterality: N/A;   CESAREAN SECTION N/A 05/26/2018   Procedure: CESAREAN SECTION;  Surgeon: Woodroe Mode, MD;  Location: [place] [facility];  Service: Obstetrics;  Laterality: N/A;   CHOLECYSTECTOMY     DILATATION & CURETTAGE/HYSTEROSCOPY WITH MYOSURE N/A 02/09/2021   Procedure: DILATATION & CURETTAGE/HYSTEROSCOPY WITH MYOSURE;  Surgeon: [name], MD;  Location: [facility];  Service: Gynecology;  Laterality: N/A;  fibroid resection   WISDOM TOOTH EXTRACTION        Vanessa Kick, MD 05/15/22 1406

## 2022-05-15 NOTE — Discharge Instructions (Addendum)
Dr. Mannie Stabile and I both feel that you should go to the ED for further evaluation of your headache as you have not had significant improvement in your symptoms despite all the medications you were given. Please have your cousin drive you straight to the ED from here.

## 2022-05-15 NOTE — ED Triage Notes (Signed)
Headache x 1 week  Seen at [facility], and treated with some symptom relief both not resolved. Sent for further eval, suggesting CT scan  Reports "wooshing" in ears

## 2022-05-15 NOTE — ED Provider Notes (Signed)
[facility]ERG[facility] DEPT Provider Note   CSN: [id number] Arrival date & time: 05/15/22  1606     History  Chief Complaint  Patient presents with   Headache   Hypertension    Tabitha Holland is a 37 y.o. female with a past medical history of HTN, migraines, who presents emergency department with concerns for headache x 1 week. Was seen at urgent care earlier today and informed to come into the ED for her symptoms.  Has associated photophobia, intermittent emesis.  Her headache feel similar to previous headaches.  Patient has tried her prescription Toradol, Benadryl, Tylenol, Excedrin without relief of her symptoms.  Denies dizziness, numbness, tingling, weakness.   Per patient chart review: Patient was evaluated at the urgent care today for similar concerns.  Patient was treated with clonidine, Decadron, Toradol, Reglan, Zofran.  Patient noted relief in her headache however still complained of pressure behind her eyes.  Patient was instructed to come into the emergency department for further evaluation with a CT scan.  The history is provided by the patient. No language interpreter was used.       Home Medications Prior to Admission medications   Medication Sig Start Date End Date Taking? Authorizing Provider  baclofen (LIORESAL) 10 MG tablet Take 1 tablet (10 mg total) by mouth 2 (two) times daily as needed for muscle spasms. 11/07/21   Raspet, Derry Skill, PA-C  dextromethorphan-guaiFENesin (MUCINEX DM) 30-600 MG 12hr tablet Take 1 tablet by mouth 2 (two) times daily. 12/27/21   Nyoka Lint, PA-C  famotidine (PEPCID) 20 MG tablet Take 1 tablet (20 mg total) by mouth 2 (two) times daily. 12/08/21   Eppie Gibson, MD  methocarbamol (ROBAXIN) 500 MG tablet Take 1 tablet (500 mg total) by mouth at bedtime. 03/21/22   Eppie Gibson, MD  Olmesartan-amLODIPine-HCTZ 40-5-25 MG TABS Take 1 tablet by mouth daily. 01/27/22   Eppie Gibson, MD  ondansetron (ZOFRAN) 4 MG  tablet Take 1 tablet (4 mg total) by mouth every 8 (eight) hours as needed for nausea or vomiting. 05/15/22   Hennesy Sobalvarro A, PA-C  Ruxolitinib Phosphate (OPZELURA) 1.5 % CREA Apply 1 Application topically daily as needed (eczema).    [provider]  spironolactone (ALDACTONE) 50 MG tablet Take 1 tablet (50 mg total) by mouth at bedtime. 03/03/22   Eppie Gibson, MD  SUMAtriptan (IMITREX) 50 MG tablet Take 1 tablet (50 mg total) by mouth every 2 (two) hours as needed for migraine. May repeat in 2 hours if headache persists or recurs. 01/27/22   Eppie Gibson, MD  triamcinolone cream (KENALOG) 0.1 % Apply 1 Application topically 2 (two) times daily. 12/27/21   Nyoka Lint, PA-C      Allergies    Codeine, Fish allergy, Hydrocodone, Latex, Percocet [oxycodone-acetaminophen], and Shellfish allergy    Review of Systems   Review of Systems  Neurological:  Positive for headaches.  All other systems reviewed and are negative.   Physical Exam Updated Vital Signs BP (!) 141/90   Pulse (!) 57   Temp 98 F (36.7 C) (Oral)   Resp 17   LMP 05/12/2022   SpO2 99%  Physical Exam Vitals and nursing note reviewed.  Constitutional:      General: She is not in acute distress.    Appearance: She is not diaphoretic.  HENT:     Head: Normocephalic and atraumatic.     Comments: Tenderness to palpation noted to left frontal and maxillary region  without overlying skin changes.    Right Ear: Tympanic membrane, ear canal and external ear normal.     Left Ear: Tympanic membrane, ear canal and external ear normal.     Mouth/Throat:     Pharynx: No oropharyngeal exudate.  Eyes:     General: No scleral icterus.    Conjunctiva/sclera: Conjunctivae normal.  Cardiovascular:     Rate and Rhythm: Normal rate and regular rhythm.     Pulses: Normal pulses.     Heart sounds: Normal heart sounds.  Pulmonary:     Effort: Pulmonary effort is normal. No respiratory distress.     Breath sounds: Normal  breath sounds. No wheezing.  Abdominal:     General: Bowel sounds are normal.     Palpations: Abdomen is soft. There is no mass.     Tenderness: There is no abdominal tenderness. There is no guarding or rebound.  Musculoskeletal:        General: Normal range of motion.     Cervical back: Normal range of motion and neck supple.  Skin:    General: Skin is warm and dry.  Neurological:     General: No focal deficit present.     Mental Status: She is alert.     Sensory: Sensation is intact.     Motor: Motor function is intact.     Coordination: Finger-Nose-Finger Test and Heel to [place] Test normal.     Comments: No focal neurological deficits. Negative pronator drift. Strength and sensation intact to BUE and BLE. Grip strength 5/5 bilaterally.  Normal finger-nose testing.  Normal heel-to-shin testing.  Cranial nerves II through XII intact.  Psychiatric:        Behavior: Behavior normal.     ED Results / Procedures / Treatments   Labs (all labs ordered are listed, but only abnormal results are displayed) Labs Reviewed  URINALYSIS, ROUTINE W REFLEX MICROSCOPIC - Abnormal; Notable for the following components:      Result Value   Hgb urine dipstick LARGE (*)    Ketones, ur 15 (*)    Protein, ur TRACE (*)    All other components within normal limits  BASIC METABOLIC PANEL - Abnormal; Notable for the following components:   Glucose, Bld 107 (*)    All other components within normal limits  CBC WITH DIFFERENTIAL/PLATELET - Abnormal; Notable for the following components:   WBC 11.4 (*)    Neutro Abs 10.2 (*)    All other components within normal limits    EKG None  Radiology CT Head Wo Contrast  Result Date: 05/15/2022 CLINICAL DATA:  Headache, increasing frequency or severity. EXAM: CT HEAD WITHOUT CONTRAST TECHNIQUE: Contiguous axial images were obtained from the base of the skull through the vertex without intravenous contrast. RADIATION DOSE REDUCTION: This exam was performed  according to the departmental dose-optimization program which includes automated exposure control, adjustment of the mA and/or kV according to patient size and/or use of iterative reconstruction technique. COMPARISON:  Head CT 04/24/2014 FINDINGS: Brain: There is no evidence of an acute infarct, intracranial hemorrhage, mass, midline shift, or extra-axial fluid collection. The ventricles and sulci are normal. There is an unchanged partially empty sella. The cerebellar tonsils are normally positioned. Vascular: No hyperdense vessel. Skull: No fracture or suspicious osseous lesion. Sinuses/Orbits: Visualized paranasal sinuses and mastoid air cells are clear. Unremarkable orbits. Other: None. IMPRESSION: 1. No evidence of acute intracranial abnormality. 2. Partially empty sella, often an incidental finding though can be seen within idiopathic intracranial  hypertension. Electronically Signed   By: Logan Bores M.D.   On: 05/15/2022 17:53    Procedures Procedures    Medications Ordered in ED Medications  sodium chloride 0.9 % bolus 1,000 mL (0 mLs Intravenous Stopped 05/15/22 1831)  acetaminophen (TYLENOL) tablet 1,000 mg (1,000 mg Oral Given 05/15/22 1802)  metoCLOPramide (REGLAN) injection 5 mg (5 mg Intravenous Given 05/15/22 2034)  diphenhydrAMINE (BENADRYL) injection 25 mg (25 mg Intravenous Given 05/15/22 2034)    ED Course/ Medical Decision Making/ A&P Clinical Course as of 05/15/22 2207  Mon May 15, 2022  1844 Pt re-evaluated and noted slight improvement in her headache. Discussed with patient regarding lab and imaging findings. Pt requesting additional nausea medication at this time.  [SB]  2108 Pt re-evaluated and noted that her symptoms improved.  Discussed with patient discharge treatment plan.  Answered all available questions.  Patient present for discharge at this time. [SB]    Clinical Course User Index [SB] Loxley Cibrian A, PA-C                             Medical Decision  Making Amount and/or Complexity of Data Reviewed Labs: ordered. Radiology: ordered.  Risk OTC drugs. Prescription drug management.   Pt presented to the ED with headache x 1 week.  Has a history of migraines and notes that this 1 is similar.  Patient afebrile.  On exam, pt with no focal neuro deficits.  No red flags of neck pain, neck stiffness, focal neuro deficits, or worst headache of life. Differential diagnosis includes SAH, ICH, Hypertensive urgency, Hypertensive emergency, migraine, tension headache.   Additional history obtained:  Additional history obtained from Spouse/Significant Other  Labs:  I ordered, and personally interpreted labs.  The pertinent results include:   CBC with leukocytosis at 11.4 otherwise unremarkable BMP unremarkable Urinalysis with large amount of hemoglobin (patient is currently on her menstrual cycle)  Imaging: I ordered imaging studies including CT head I independently visualized and interpreted imaging which showed:  1. No evidence of acute intracranial abnormality.  2. Partially empty sella, often an incidental finding though can be  seen within idiopathic intracranial hypertension.   I agree with the radiologist interpretation  Medications:  I ordered medication including Benadryl, Reglan, IV fluids, Toradol for symptom management  Reevaluation of the patient after these medicines and interventions, I reevaluated the patient and found that they have improved I have reviewed the patients home medicines and have made adjustments as needed    Disposition: Presentation suspicious for likely migraine headache.  Doubt concerns for CVA, TIA, intracranial abnormality. Presentation less likely due to [place] or ICH.  After consideration of the diagnostic results and the patients response to treatment, I feel that the patient would benefit from Discharge home.  Ambulatory referral to neurology provided.  Zofran prescription sent.  Discussed supportive  care measures and strict return precautions with patient consisting of increasing persistent chest pain, increasing persistent shortness of breath, sudden onset headache.  Pt acknowledges and verbalizes understanding and is agreeable to plan. Patient appears safe for discharge at this time.  Follow-up as indicated in the discharge paperwork.  This chart was dictated using voice recognition software, Dragon. Despite the best efforts of this provider to proofread and correct errors, errors may still occur which can change documentation meaning.   Final Clinical Impression(s) / ED Diagnoses Final diagnoses:  Bad headache  Elevated blood pressure reading    Rx /  DC Orders ED Discharge Orders          Ordered    Ambulatory referral to Neurology       Comments: An appointment is requested in approximately: 2 weeks   05/15/22 2113    ondansetron (ZOFRAN) 4 MG tablet  Every 8 hours PRN        05/15/22 2113              Auburn Hester A, PA-C 58/83/25 [id number]    Campbell Stall P, DO 64/15/83 2358

## 2022-05-15 NOTE — ED Provider Notes (Addendum)
Tabitha Holland is a 37 y.o. female that presented here for evaluation of a headache. She was evaluated by Dr. Silvio Pate see his note for further details] who has transferred her care to me at 14:30. Patient was given clonidine, decadron, Toradol, Reglan and Zofran in the clinic. Patient reports some relief in her headache but still complains of significant pressure behind her eyes. She is AAOx3. Afebrile. Normotensive at 137/76. Uncomfortable but nontoxic. No focal neurological deficits. Patient advised to go to the ED for further evaluation and management. Patient initially reluctant but agreeable. Patient stable to transport via private vehicle.    Enrique Sack, [place] 05/15/22 [place], [place], [place] 05/15/22 1537

## 2022-05-16 ENCOUNTER — Encounter: Payer: Self-pay | Admitting: Student

## 2022-05-17 ENCOUNTER — Encounter: Payer: Self-pay | Admitting: Neurology

## 2022-05-17 ENCOUNTER — Ambulatory Visit (INDEPENDENT_AMBULATORY_CARE_PROVIDER_SITE_OTHER): Payer: Medicaid Other | Admitting: Neurology

## 2022-05-17 ENCOUNTER — Other Ambulatory Visit: Payer: Self-pay | Admitting: Student

## 2022-05-17 VITALS — BP 152/94 | HR 64 | Ht 72.0 in | Wt 362.0 lb

## 2022-05-17 DIAGNOSIS — G932 Benign intracranial hypertension: Secondary | ICD-10-CM | POA: Diagnosis not present

## 2022-05-17 MED ORDER — MECLIZINE HCL 12.5 MG PO TABS: 12.5000 mg | ORAL_TABLET | Freq: Three times a day (TID) | ORAL | 0 refills | Status: DC | PRN

## 2022-05-17 MED ORDER — ACETAZOLAMIDE 250 MG PO TABS: 500.0000 mg | ORAL_TABLET | Freq: Two times a day (BID) | ORAL | 6 refills | Status: DC

## 2022-05-17 NOTE — Progress Notes (Signed)
[facility]  PATIENT: Tabitha Holland DOB: 09-16-1985  REQUESTING CLINICIAN: Blue, Soijett A, PA-C HISTORY FROM: Patient  REASON FOR VISIT: Headaches    HISTORICAL  CHIEF COMPLAINT:  Chief Complaint  Patient presents with   New Patient (Initial Visit)    Rm 13 with wife. Reports she is here to disucss worsening headaches pt reports h/a present for the last week. She has struggled with foggy headedness,visual disturbance and dizziness. She reports this morning she woke up with left eye visual loss this was new and has improved as the day went on.      HISTORY OF PRESENT ILLNESS:  This is a 37 year old woman past medical history of anxiety, depression, hypertension, obesity, headaches who is presenting with complaint of persistent headaches for the past 9 days.  Patient reports she woke up last Wednesday morning with headaches and the headache did not go away.  On top of the headache she also experienced back pain, visual obscuration and buzzing feeling in the ears.  She also reports pressure in the head.  The pain was so severe that she presented to the ED 3 days ago.  At that time, she did have a head CT which showed no acute abnormality but show evidence of partially empty sella.  She was also given a migraine cocktail which improved her symptoms.  She is still symptomatic, describes a headache at 7 out of 10, pressure squeezing pain behind the left eye which is also throbbing. She has tried Tylenol.  Patient reports she woke up today with loss of vision in the left eye. She did report a history of headaches since the age of 67 intermittent but this is the most severe episode she ever had.  She also feels with this episode her speech is not coherent she had difficulty formulating the words.   Headache History and Characteristics: Onset: Last Tuesday 05/09/22 Location: Behind left eye  Quality:  Pressure squeezing type pain, thrombing Intensity: 7 /10 currently    Duration: Last  Migrainous Features: Photophobia, nausea, vomiting.  Aura: No  History of brain injury or tumor: No  Family history: Not sure  Motion sickness: no Cardiac history: no  OTC: tylenol, Toradol  Caffeine: Morning coffee  Sleep:  Good  Mood/ Stress:   Prior prophylaxis: Propranolol: No  Verapamil:No TCA: No Topamax: No Depakote: No Effexor: No Cymbalta: No Neurontin:No  Prior abortives: Triptan: Yes  Anti-emetic: No Steroids: No Ergotamine suppository: No   OTHER MEDICAL CONDITIONS: Obesity, Headaches, Hypertension, anxiety/Depression   REVIEW OF SYSTEMS: Full 14 system review of systems performed and negative with exception of: As noted in the HPI   ALLERGIES: Allergies  Allergen Reactions   Codeine Other (See Comments)    Migraines    Fish Allergy Itching   Hydrocodone Other (See Comments)    Migraines    Latex Itching   Percocet [Oxycodone-Acetaminophen] Other (See Comments)    Migraines    Shellfish Allergy Itching    HOME MEDICATIONS: Outpatient Medications Prior to Visit  Medication Sig Dispense Refill   meclizine (ANTIVERT) 12.5 MG tablet Take 12.5 mg by mouth 3 (three) times daily as needed for dizziness.     Olmesartan-amLODIPine-HCTZ 40-5-25 MG TABS Take 1 tablet by mouth daily. 30 tablet 1   ondansetron (ZOFRAN) 4 MG tablet Take 1 tablet (4 mg total) by mouth every 8 (eight) hours as needed for nausea or vomiting. 10 tablet 0   Ruxolitinib Phosphate (OPZELURA) 1.5 % CREA Apply 1 Application  topically daily as needed (eczema).     spironolactone (ALDACTONE) 50 MG tablet Take 1 tablet (50 mg total) by mouth at bedtime. 90 tablet 1   triamcinolone cream (KENALOG) 0.1 % Apply 1 Application topically 2 (two) times daily. 30 g 0   meclizine (ANTIVERT) 12.5 MG tablet Take 1 tablet (12.5 mg total) by mouth 3 (three) times daily as needed for dizziness. 30 tablet 0   methocarbamol (ROBAXIN) 500 MG tablet Take 1 tablet (500 mg total) by mouth  at bedtime. 3 tablet 0   baclofen (LIORESAL) 10 MG tablet Take 1 tablet (10 mg total) by mouth 2 (two) times daily as needed for muscle spasms. (Patient not taking: Reported on 05/17/2022) 30 each 0   dextromethorphan-guaiFENesin (MUCINEX DM) 30-600 MG 12hr tablet Take 1 tablet by mouth 2 (two) times daily. (Patient not taking: Reported on 05/17/2022) 14 tablet 0   famotidine (PEPCID) 20 MG tablet Take 1 tablet (20 mg total) by mouth 2 (two) times daily. (Patient not taking: Reported on 05/17/2022) 60 tablet 0   SUMAtriptan (IMITREX) 50 MG tablet Take 1 tablet (50 mg total) by mouth every 2 (two) hours as needed for migraine. May repeat in 2 hours if headache persists or recurs. (Patient not taking: Reported on 05/17/2022) 20 tablet 0   Facility-Administered Medications Prior to Visit  Medication Dose Route Frequency Provider Last Rate Last Admin   diclofenac Sodium (VOLTAREN) 1 % topical gel 2 g  2 g Topical PRN Holley Bouche, MD        PAST MEDICAL HISTORY: Past Medical History:  Diagnosis Date   Anxiety    Depression    Gallstones    History of anemia    per pt on 02/02/2021   History of suicidal ideation 10/02/2019   Hx of wisdom tooth extraction    Hypertension    Migraines    occasional per pt 02/02/2021   Morbid obesity ([place])     PAST SURGICAL HISTORY: Past Surgical History:  Procedure Laterality Date   CESAREAN SECTION N/A 10/28/2013   Procedure: CESAREAN SECTION;  Surgeon: Emily Filbert, MD;  [facility];  Service: Obstetrics;  Laterality: N/A;   CESAREAN SECTION N/A 09/15/2015   Procedure: CESAREAN SECTION;  Surgeon: Mora Bellman, MD;  Location: [facility];  Service: Obstetrics;  Laterality: N/A;   CESAREAN SECTION N/A 05/26/2018   Procedure: CESAREAN SECTION;  Surgeon: Woodroe Mode, MD;  Location: [facility];  Service: Obstetrics;  Laterality: N/A;   CHOLECYSTECTOMY     DILATATION & CURETTAGE/HYSTEROSCOPY WITH MYOSURE N/A 02/09/2021   Procedure:  DILATATION & CURETTAGE/HYSTEROSCOPY WITH MYOSURE;  Surgeon: [name], MD;  Location: [facility];  Service: Gynecology;  Laterality: N/A;  fibroid resection   WISDOM TOOTH EXTRACTION      FAMILY HISTORY: Family History  Adopted: Yes  Problem Relation Age of Onset   Hypertension Sister    Bipolar disorder Brother    Schizophrenia Brother     SOCIAL HISTORY: Social History   Socioeconomic History   Marital status: Soil scientist    Spouse name: Not on file   Number of children: 3   Years of education: Not on file   Highest education level: Not on file  Occupational History   Occupation: Charity fundraiser  Tobacco Use   Smoking status: Some Days    Packs/day: 0.25    Types: Cigarettes   Smokeless tobacco: Never  Vaping Use   Vaping Use: Never used  Substance and Sexual Activity   Alcohol use: Yes    Comment: 2-3 weekly   Drug use: Not Currently    Types: Marijuana   Sexual activity: Not Currently    Birth control/protection: None  Other Topics Concern   Not on file  Social History Narrative   Not on file   Social Determinants of Health   Financial Resource Strain: Not on file  Food Insecurity: No Food Insecurity (11/22/2020)   Hunger Vital Sign    Worried About Running Out of Food in the Last Year: Never true    Ran Out of Food in the Last Year: Never true  Transportation Needs: No Transportation Needs (11/22/2020)   PRAPARE - Hydrologist (Medical): No    Lack of Transportation (Non-Medical): No  Physical Activity: Not on file  Stress: Not on file  Social Connections: Not on file  Intimate Partner Violence: Not on file    PHYSICAL EXAM  GENERAL EXAM/CONSTITUTIONAL: Vitals:  Vitals:   05/17/22 1414  BP: (!) 152/94  Pulse: 64  SpO2: 100%  Weight: (!) 362 lb (164.2 kg)  Height: 6' (1.829 m)   Body mass index is 49.1 kg/m. Wt Readings from Last 3 Encounters:  05/17/22 (!) 362 lb (164.2 kg)   05/14/22 (!) 354 lb 0.9 oz (160.6 kg)  03/03/22 (!) 354 lb (160.6 kg)   Patient is in no distress; well developed, nourished and groomed; neck is supple  EYES: Pupils round and reactive to light, Visual fields full to confrontation, Extraocular movements intacts,   MUSCULOSKELETAL: Gait, strength, tone, movements noted in Neurologic exam below  NEUROLOGIC: MENTAL STATUS:      No data to display         awake, alert, oriented to person, place and time recent and remote memory intact normal attention and concentration language fluent, comprehension intact, naming intact fund of knowledge appropriate  CRANIAL NERVE:  2nd - no papilledema or hemorrhages on fundoscopic exam 2nd, 3rd, 4th, 6th - pupils equal and reactive to light, visual fields full to confrontation, extraocular muscles intact, no nystagmus 5th - facial sensation symmetric 7th - facial strength symmetric 8th - hearing intact 9th - palate elevates symmetrically, uvula midline 11th - shoulder shrug symmetric 12th - tongue protrusion midline  MOTOR:  normal bulk and tone, full strength in the BUE, BLE  SENSORY:  normal and symmetric to light touch, pinprick  COORDINATION:  finger-nose-finger, fine finger movements normal  REFLEXES:  deep tendon reflexes present and symmetric  GAIT/STATION:  normal   DIAGNOSTIC DATA (LABS, IMAGING, TESTING) - I reviewed patient records, labs, notes, testing and imaging myself where available.  Lab Results  Component Value Date   WBC 11.4 (H) 05/15/2022   HGB 12.5 05/15/2022   HCT 37.2 05/15/2022   MCV 84.7 05/15/2022   PLT 214 05/15/2022      Component Value Date/Time   NA 137 05/15/2022 1805   NA 140 03/31/2022 1512   K 4.2 05/15/2022 1805   CL 101 05/15/2022 1805   CO2 27 05/15/2022 1805   GLUCOSE 107 (H) 05/15/2022 1805   BUN 13 05/15/2022 1805   BUN 10 03/31/2022 1512   CREATININE 0.84 05/15/2022 1805   CREATININE 0.54 07/18/2013 0924   CALCIUM  9.4 05/15/2022 1805   PROT 6.5 10/21/2021 1039   PROT 6.6 08/03/2020 0952   ALBUMIN 3.2 (L) 10/21/2021 1039   ALBUMIN 3.7 (L) 08/03/2020 0952   AST 12 (L) 10/21/2021 1039  ALT 10 10/21/2021 1039   ALKPHOS 71 10/21/2021 1039   BILITOT 0.5 10/21/2021 1039   BILITOT 0.6 08/03/2020 0952   GFRNONAA >60 05/15/2022 1805   GFRAA >60 10/02/2019 1645   Lab Results  Component Value Date   CHOL 139 08/03/2020   HDL 55 08/03/2020   LDLCALC 72 08/03/2020   TRIG 58 08/03/2020   CHOLHDL 2.5 08/03/2020   Lab Results  Component Value Date   HGBA1C 5.2 12/05/2021   No results found for: "VITAMINB12" Lab Results  Component Value Date   TSH 1.420 08/03/2020    Head CT 05/15/2022 1. No evidence of acute intracranial abnormality. 2. Partially empty sella, often an incidental finding though can be seen within idiopathic intracranial hypertension.   ASSESSMENT AND PLAN  37 y.o. year old female with history of obesity, hypertension, headaches, anxiety/depression who is presenting with complaint of persistent headache for the past 9 days.  Her head CT showed evidence of partially empty sella.  She did also complain of visual obscuration.  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   1. IIH (idiopathic intracranial hypertension)     Patient Instructions  Urgent LP to check for opening pressure Start Diamox 250 twice daily for a week then increase to 500 mg twice daily Referral to ophthalmology for visual field testing and dilated funduscopic exam Recommend aggressive weight loss management Follow-up in 3 months or sooner if worse.   Orders Placed This Encounter  Procedures   DG FL GUIDED LUMBAR PUNCTURE   Ambulatory  referral to Ophthalmology    Meds ordered this encounter  Medications   acetaZOLAMIDE (DIAMOX) 250 MG tablet    Sig: Take 2 tablets (500 mg total) by mouth 2 (two) times daily.    Dispense:  120 tablet    Refill:  6    Return in about 3 months (around 08/16/2022).    Alric Ran, MD 05/17/2022, 6:06 PM  [facility] [address], [place], [place] [address] [phone]

## 2022-05-17 NOTE — Patient Instructions (Signed)
Urgent LP to check for opening pressure Start Diamox 250 twice daily for a week then increase to 500 mg twice daily Referral to ophthalmology for visual field testing and dilated funduscopic exam Recommend aggressive weight loss management Follow-up in 3 months or sooner if worse.

## 2022-05-18 ENCOUNTER — Telehealth: Payer: Self-pay | Admitting: Neurology

## 2022-05-18 NOTE — Telephone Encounter (Signed)
Referral for ophthalmology fax to [facility]. Phone: [phone]. Fax: [phone]

## 2022-05-23 ENCOUNTER — Encounter: Payer: Self-pay | Admitting: Neurology

## 2022-05-23 NOTE — Telephone Encounter (Signed)
Called patient. She started taking new medication, Acetazolamide, on 05/17/22. Symptoms started Saturday evening. She reports that she began experiencing tightness in chest, difficulty catching breath with exertion and weakness in legs and arms. 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   She states that when she didn't take her medication last night that the symptoms had resolved. She states that she took medication this morning for headache and then shortly after started having issues with [facility] with exertion.   Advised patient to contact neurologist regarding possible medication side effects.   Also provided with ED precautions. Patient able to speak in complete sentences at the time of phone call.   Forwarding to PCP for additional recommendations.   Talbot Grumbling, RN

## 2022-05-29 ENCOUNTER — Ambulatory Visit (INDEPENDENT_AMBULATORY_CARE_PROVIDER_SITE_OTHER): Payer: Medicaid Other | Admitting: Student

## 2022-05-29 ENCOUNTER — Telehealth: Payer: Self-pay

## 2022-05-29 ENCOUNTER — Other Ambulatory Visit: Payer: Self-pay | Admitting: Neurology

## 2022-05-29 ENCOUNTER — Encounter: Payer: Self-pay | Admitting: Family Medicine

## 2022-05-29 VITALS — BP 150/100 | HR 70 | Ht 72.0 in | Wt 362.0 lb

## 2022-05-29 DIAGNOSIS — G43919 Migraine, unspecified, intractable, without status migrainosus: Secondary | ICD-10-CM

## 2022-05-29 DIAGNOSIS — R519 Headache, unspecified: Secondary | ICD-10-CM

## 2022-05-29 MED ORDER — TOPIRAMATE 50 MG PO TABS: 50.0000 mg | ORAL_TABLET | Freq: Two times a day (BID) | ORAL | 6 refills | Status: DC

## 2022-05-29 MED ORDER — KETOROLAC TROMETHAMINE 30 MG/ML IJ SOLN
30.0000 mg | Freq: Once | INTRAMUSCULAR | Status: AC
  Administered 2022-05-29: 30 mg via INTRAMUSCULAR

## 2022-05-29 NOTE — Patient Instructions (Signed)
I talked to Dr. April Manson, They are going to try to get you your LP this week.  We are giving you a shot of Toradol here.  Please take 50 mg of Benadryl when you get home to see if that helps break this headache.  Please also add the Topamax that neurology wanted you to add.  Hopefully we will have more answers by the end of this week.  [name], MD

## 2022-05-29 NOTE — Telephone Encounter (Signed)
Please inform patient to continue with Diamox 250 mg (1 tab) twice daily and I will add Topiramate to her regiment 50 mg twice daily

## 2022-05-29 NOTE — Progress Notes (Unsigned)
    SUBJECTIVE:   CHIEF COMPLAINT / HPI:   Headaches Ongoing issue. Associated with L sided visual field deficits. 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 Trying to get scheduled for urgent LP for diagnostic and therapeutic benefit.   PERTINENT  PMH / PSH: ***  OBJECTIVE:   BP (!) 156/92   Pulse 70   Ht 6' (1.829 m)   Wt (!) 362 lb (164.2 kg)   LMP 05/12/2022   SpO2 100%   BMI 49.10 kg/m   ***  ASSESSMENT/PLAN:   No problem-specific Assessment & Plan notes found for this encounter.     [name], MD [place]

## 2022-05-29 NOTE — Telephone Encounter (Signed)
Dr. April Manson requested follow up on LP for this pt.  Do we have any information?  Thanks!

## 2022-05-29 NOTE — Telephone Encounter (Signed)
Can we please contact patient and have her schedule her appointment. Thanks

## 2022-05-29 NOTE — Telephone Encounter (Signed)
Spoke with patient.   She was currently talking with a clinic while at work and could not discuss long.   Patient scheduled for 2:10 this afternoon.

## 2022-05-29 NOTE — Telephone Encounter (Signed)
Yong Channel, [place] L  You10 minutes ago (2:19 PM)   [facility] says [facility] left her a voicemail on 1/19 and 1/29 to schedule  Update on LP.

## 2022-05-30 ENCOUNTER — Encounter: Payer: Self-pay | Admitting: Student

## 2022-05-30 NOTE — Telephone Encounter (Signed)
Noted thank you

## 2022-05-30 NOTE — Assessment & Plan Note (Addendum)
Similar presentation to her ongoing episodic headaches.  While the loss of visual acuity in her left eye is concerning, this is consistent with previous presentations and she has had normal head imaging in the past with similar findings.  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  Will follow-up on scheduling for her urgent LP.  It looks like [facility] was tried to call her a few times to get this scheduled and has left voicemails.  I will have her call them back to get this scheduled ASAP.  In the meantime, we will give 30 mg of Toradol in clinic today and have her take 50 mg of Benadryl upon getting back home to attempt to abort the present headache. -Continue acetazolamide and Topamax per neurology -'30mg'$  IM Toradol -'50mg'$  PO benadryl at home (would give in clinic but she has to drive home)

## 2022-06-01 ENCOUNTER — Ambulatory Visit
Admission: RE | Admit: 2022-06-01 | Discharge: 2022-06-01 | Disposition: A | Discharge status: Home / Self Care | Payer: Medicaid Other | Source: Ambulatory Visit | Attending: Neurology | Admitting: Neurology

## 2022-06-01 VITALS — BP 142/72 | HR 63

## 2022-06-01 DIAGNOSIS — G932 Benign intracranial hypertension: Secondary | ICD-10-CM

## 2022-06-01 LAB — CSF CELL COUNT WITH DIFFERENTIAL
Basophils, %: 0 %
Eosinophils, CSF: 0 %
Lymphs, CSF: 90 % — ABNORMAL HIGH (ref 40–80)
Monocyte/Macrophage: 10 % — ABNORMAL LOW (ref 15–45)
RBC Count, CSF: 1 cells/uL — ABNORMAL HIGH
Segmented Neutrophils-CSF: 0 % (ref 0–6)
TOTAL NUCLEATED CELL: 23 cells/uL — ABNORMAL HIGH (ref 0–5)

## 2022-06-01 LAB — GLUCOSE, CSF: Glucose, CSF: 58 mg/dL (ref 40–80)

## 2022-06-01 LAB — PROTEIN, CSF: Total Protein, CSF: 41 mg/dL (ref 15–45)

## 2022-06-01 NOTE — Discharge Instructions (Signed)

## 2022-06-05 ENCOUNTER — Telehealth: Payer: Self-pay

## 2022-06-05 ENCOUNTER — Encounter: Payer: Self-pay | Admitting: Neurology

## 2022-06-05 ENCOUNTER — Encounter: Payer: Self-pay | Admitting: Student

## 2022-06-05 NOTE — Telephone Encounter (Signed)
We will give her another couple days, if no resolution then we will send her for blood patch.

## 2022-06-05 NOTE — Telephone Encounter (Signed)
Nurse called to ask Dr. April Manson to consider the blood patch for patient, I informed her a message had already been sent to patient by Dr. April Manson about the situation and she wanted Dr. April Manson to reconsider. I told her I would send to Dr. April Manson to advise if needed

## 2022-06-21 ENCOUNTER — Encounter: Payer: Self-pay | Admitting: Neurology

## 2022-06-21 ENCOUNTER — Encounter: Payer: Self-pay | Admitting: Student

## 2022-06-23 ENCOUNTER — Other Ambulatory Visit: Payer: Self-pay | Admitting: Student

## 2022-06-23 DIAGNOSIS — I10 Essential (primary) hypertension: Secondary | ICD-10-CM

## 2022-08-04 ENCOUNTER — Encounter: Payer: Self-pay | Admitting: Student

## 2022-08-04 ENCOUNTER — Ambulatory Visit: Payer: Medicaid Other | Admitting: Student

## 2022-08-04 VITALS — BP 117/77 | HR 74 | Ht 72.0 in | Wt 364.8 lb

## 2022-08-04 DIAGNOSIS — F430 Acute stress reaction: Secondary | ICD-10-CM

## 2022-08-04 MED ORDER — LORAZEPAM 1 MG PO TABS: 1.0000 mg | ORAL_TABLET | Freq: Two times a day (BID) | ORAL | 0 refills | Status: DC | PRN

## 2022-08-04 MED ORDER — FLUOXETINE HCL 20 MG PO TABS: 20.0000 mg | ORAL_TABLET | Freq: Every day | ORAL | 3 refills | Status: DC

## 2022-08-04 NOTE — Patient Instructions (Addendum)
I'm so sorry for all that you and Rosann Auerbach are going through. I know it's a lot. Given the disruptive nature of your symptoms, I think we ought to add a low-dose medicine for mood/panic. Send me  a MyChart message in ~2 weeks to let me know how you're feeling. Make an appt for you and Trish to see me together when you get back from [place].  I do think you ought to keep taking the Topamax and (especially) the Diamox.   You guys make a good team--lean on each other to do the things that only you can do for each other.   Eliezer Mccoy, MD

## 2022-08-04 NOTE — Progress Notes (Signed)
    SUBJECTIVE:   CHIEF COMPLAINT / HPI:   Panic Symptoms Patient with a history of IIH, followed by neuro. S/p therapeutic LP ~2 mo ago and now stabilized on Diamox. Had been doing really well for ~6 weeks.  However, over the past 2 weeks she has started to have a tingling sensation in her arms that is usually followed by a full-blown plan to contact that interferes with her work, she often has to be pulled into her bosses office and consoled by her coworkers.  She has had panic symptoms like this in the past but it has been years and years and years since she has had them.  She was not on any medications for them in the past as she was not under any doctor's care.  The only time that she has been on psych meds in the past was when she was about the age of 70 and she was placed on Zoloft for unclear emotional/behavioral disturbances that she was moving through the foster care system.  She does not remember exactly what her side effects were but she did find that this was bothersome to her.  She attributes her recent onset of symptoms to her wife's Cancer Diagnosis. Her wife, Rosann Auerbach was recently diagnosed with triple-neg breast cancer and they have had a whirlwind of visits and testing over the past two weeks and will be starting chemo soon. They are preparing to drive to [place] this evening to tell her wife's children about her diagnosis. This is obviously a tremendous stressor for her. Notes that her weight is up, tells me she has been self-medicating with food.  OBJECTIVE:   BP 117/77   Pulse 74   Ht 6' (1.829 m)   Wt (!) 364 lb 12.8 oz (165.5 kg)   LMP 07/24/2022   SpO2 100%   BMI 49.48 kg/m    Gen: Intermittently emotional but NAD HENT: EOMs intact, sclerae anicteric Cardio: RRR, no m/r/g Pulm: Normal WOB on RA, lungs clear Neuro: CN II-XII Intact, gait normal, speech fluent, speech and sensation intact  ASSESSMENT/PLAN:   Acute stress disorder Given benign neuro exam, I am not too  concerned that this represents sequelae of her IIH which seems well-controlled on Diamox/Topamax. Suspect her panic symptoms are related to the acute stress of wife's recent cancer diagnosis. - Fluoxetine 20mg  daily - Giving 6 tablets 1mg  Ativan for PRN use as they are preparing to head out of state - She will make a joint appt with her and her wife when they return from [place]     J Dorothyann Gibbs, MD [facility]

## 2022-08-07 ENCOUNTER — Other Ambulatory Visit (HOSPITAL_COMMUNITY): Payer: Self-pay

## 2022-08-07 ENCOUNTER — Encounter: Payer: Self-pay | Admitting: Student

## 2022-08-07 DIAGNOSIS — F430 Acute stress reaction: Secondary | ICD-10-CM | POA: Insufficient documentation

## 2022-08-07 NOTE — Assessment & Plan Note (Addendum)
Given benign neuro exam, I am not too concerned that this represents sequelae of her IIH which seems well-controlled on Diamox/Topamax. Suspect her panic symptoms are related to the acute stress of wife's recent cancer diagnosis. - Fluoxetine 20mg  daily - Giving 6 tablets 1mg  Ativan for PRN use as they are preparing to head out of state - She will make a joint appt with her and her wife when they return from [place]

## 2022-08-10 ENCOUNTER — Other Ambulatory Visit (HOSPITAL_COMMUNITY): Payer: Self-pay

## 2022-08-28 ENCOUNTER — Encounter: Payer: Self-pay | Admitting: Neurology

## 2022-08-28 ENCOUNTER — Ambulatory Visit: Payer: Medicaid Other | Admitting: Neurology

## 2022-08-29 ENCOUNTER — Other Ambulatory Visit: Payer: Self-pay | Admitting: Student

## 2022-08-29 ENCOUNTER — Encounter: Payer: Self-pay | Admitting: Student

## 2022-08-29 DIAGNOSIS — I10 Essential (primary) hypertension: Secondary | ICD-10-CM

## 2022-08-29 NOTE — Telephone Encounter (Signed)
Called patient. Patient needs afternoon appointment. Scheduled in [facility] clinic with PCP on Thursday, 09/07/22.  Veronda Prude, RN

## 2022-09-07 ENCOUNTER — Ambulatory Visit: Payer: Medicaid Other

## 2022-10-06 ENCOUNTER — Encounter (HOSPITAL_COMMUNITY): Payer: Self-pay | Admitting: *Deleted

## 2022-10-06 ENCOUNTER — Emergency Department (HOSPITAL_COMMUNITY)
Admission: EM | Admit: 2022-10-06 | Discharge: 2022-10-06 | Disposition: A | Discharge status: Home / Self Care | Payer: Medicaid Other | Attending: Emergency Medicine | Admitting: Emergency Medicine

## 2022-10-06 ENCOUNTER — Other Ambulatory Visit: Payer: Self-pay

## 2022-10-06 ENCOUNTER — Emergency Department (HOSPITAL_COMMUNITY): Payer: Medicaid Other

## 2022-10-06 DIAGNOSIS — Z79899 Other long term (current) drug therapy: Secondary | ICD-10-CM | POA: Diagnosis not present

## 2022-10-06 DIAGNOSIS — G43811 Other migraine, intractable, with status migrainosus: Secondary | ICD-10-CM | POA: Insufficient documentation

## 2022-10-06 DIAGNOSIS — I10 Essential (primary) hypertension: Secondary | ICD-10-CM | POA: Insufficient documentation

## 2022-10-06 DIAGNOSIS — R079 Chest pain, unspecified: Secondary | ICD-10-CM | POA: Diagnosis present

## 2022-10-06 DIAGNOSIS — Z91040 Latex allergy status: Secondary | ICD-10-CM | POA: Insufficient documentation

## 2022-10-06 LAB — CBC
HCT: 37.5 % (ref 36.0–46.0)
Hemoglobin: 12.1 g/dL (ref 12.0–15.0)
MCH: 27.8 pg (ref 26.0–34.0)
MCHC: 32.3 g/dL (ref 30.0–36.0)
MCV: 86.2 fL (ref 80.0–100.0)
Platelets: 250 10*3/uL (ref 150–400)
RBC: 4.35 MIL/uL (ref 3.87–5.11)
RDW: 13.2 % (ref 11.5–15.5)
WBC: 7.8 10*3/uL (ref 4.0–10.5)
nRBC: 0 % (ref 0.0–0.2)

## 2022-10-06 LAB — TROPONIN I (HIGH SENSITIVITY)
Troponin I (High Sensitivity): 7 ng/L (ref <18)
Troponin I (High Sensitivity): 6 ng/L (ref <18)

## 2022-10-06 LAB — BASIC METABOLIC PANEL
Anion gap: 11 (ref 5–15)
BUN: 9 mg/dL (ref 6–20)
CO2: 24 mmol/L (ref 22–32)
Calcium: 9 mg/dL (ref 8.9–10.3)
Chloride: 102 mmol/L (ref 98–111)
Creatinine, Ser: 0.82 mg/dL (ref 0.44–1.00)
GFR, Estimated: >60 mL/min (ref >60)
Glucose, Bld: 103 mg/dL — ABNORMAL HIGH (ref 70–99)
Potassium: 3.9 mmol/L (ref 3.5–5.1)
Sodium: 137 mmol/L (ref 135–145)

## 2022-10-06 LAB — I-STAT BETA HCG BLOOD, ED (MC, WL, AP ONLY): I-stat hCG, quantitative: <5 m[IU]/mL (ref <5)

## 2022-10-06 LAB — D-DIMER, QUANTITATIVE: D-Dimer, Quant: 0.35 ug/mL-FEU (ref 0.00–0.50)

## 2022-10-06 MED ORDER — KETOROLAC TROMETHAMINE 15 MG/ML IJ SOLN
15.0000 mg | Freq: Once | INTRAMUSCULAR | Status: AC
  Administered 2022-10-06: 15 mg via INTRAVENOUS
  Filled 2022-10-06: qty 1

## 2022-10-06 MED ORDER — METOCLOPRAMIDE HCL 5 MG/ML IJ SOLN
10.0000 mg | Freq: Once | INTRAMUSCULAR | Status: AC
  Administered 2022-10-06: 10 mg via INTRAVENOUS
  Filled 2022-10-06: qty 2

## 2022-10-06 NOTE — Discharge Instructions (Signed)
All your blood work today looks normal.  No evidence of issues with your heart or lungs.  No signs of blood clot.  The pain may be coming from the weather changes and smoking can irritate the lung lining you may have pleurisy.  Also some of your symptoms may be coming from your IIH.  May be a good idea to start back on your Diamox.  You can do Tylenol or ibuprofen as needed for the pain in your chest.  If you start having high fever, vision changes, confusion, persistent vomiting return to the emergency room immediately.

## 2022-10-06 NOTE — ED Provider Notes (Signed)
EKG interpreted by myself at October 06, 2022 at 6:23 AM showing normal sinus rhythm, rate of 60 bpm, normal axis normal intervals normal ST segments normal T waves, essentially normal EKG.   Eber Hong, MD 10/06/22 [phone]

## 2022-10-06 NOTE — ED Notes (Signed)
Pt is a&ox4, warm and dry to touch. Pt states that she began having chest pain last night. Pt describes pain as central chest that is constant at a 6/10. Pt also complains of left arm numbness and tingling. Pt states that sometimes the chest pain shoots up into her neck. Pt also complains of HA, which she has a history of. Pt denies n/v/d/shob. Pt changed into a gown, attached to monitor/vitals. Call light within reach of patient. SO at the bedside.

## 2022-10-06 NOTE — ED Triage Notes (Signed)
Patient presents to ed via [facility] states she has been having chest pain with sob and tingling to her left arm states when she woke up this am it was worse

## 2022-10-06 NOTE — ED Provider Notes (Signed)
[place] EMERGENCY DEPARTMENT AT [facility] Provider Note   CSN: [id number] Arrival date & time: 10/06/22  4098     History  Chief Complaint  Patient presents with   Chest Pain    Tabitha Holland is a 37 y.o. female.  Patient is a 37 year old female with a history of IIH, hypertension, migraines who is presenting today with multiple complaints.  Patient reports that this week she has had fairly consistent generalized chest pain that is worse sometimes with movement and taking a deep breath, occasional cough and intermittent shortness of breath.  Symptoms can occur while she is at rest or with exertion.  Also she has noticed in the last few weeks her migraines are starting to come back and she was concerned her IIH was acting up she actually had gone back on her Diamox but reports that sometimes that can give her shortness of breath so the last 2 days she had stopped taking it.  She intermittently takes her blood pressure medication because when she has been checking at home after getting the IIH under control her blood pressure has been much better.  She denies any productive cough, fever, abdominal pain, nausea or vomiting.  She did report yesterday after she got home from work she was feeling very tired started having the chest pain again and some tingling in her arm with severe fatigue.  She went to bed and slept all night but when she woke up this morning the chest pain was worse she was coughing and felt like she could not catch her breath and just did not feel well.  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  She has not had any recent trips.  She has no prior history of PE or DVT but is adopted so is not sure what runs in her family.  She does use tobacco products but denies any drug use.  No OCPs.  The history is provided by the patient and medical records.  Chest Pain      Home  Medications Prior to Admission medications   Medication Sig Start Date End Date Taking? Authorizing Provider  acetaZOLAMIDE (DIAMOX) 250 MG tablet Take 2 tablets (500 mg total) by mouth 2 (two) times daily. 05/17/22 12/13/22  [name], MD  FLUoxetine (PROZAC) 20 MG tablet Take 1 tablet (20 mg total) by mouth daily. 08/04/22   [name], MD  LORazepam (ATIVAN) 1 MG tablet Take 1 tablet (1 mg total) by mouth 2 (two) times daily as needed for anxiety (Panic). 08/04/22   [name], MD  meclizine (ANTIVERT) 12.5 MG tablet Take 12.5 mg by mouth 3 (three) times daily as needed for dizziness.    [provider]  Olmesartan-amLODIPine-HCTZ 40-5-25 MG TABS TAKE 1 TABLET BY MOUTH DAILY 06/23/22   [name], MD  ondansetron (ZOFRAN) 4 MG tablet Take 1 tablet (4 mg total) by mouth every 8 (eight) hours as needed for nausea or vomiting. 05/15/22   Blue, Soijett A, PA-C  Ruxolitinib Phosphate (OPZELURA) 1.5 % CREA Apply 1 Application topically daily as needed (eczema).    [provider]  spironolactone (ALDACTONE) 50 MG tablet TAKE 1 TABLET(50 MG) BY MOUTH AT BEDTIME 08/30/22   [name], MD  topiramate (TOPAMAX) 50 MG tablet Take 1 tablet (50 mg total) by mouth 2 (two) times daily. 05/29/22 12/25/22  [name], MD  triamcinolone  cream (KENALOG) 0.1 % Apply 1 Application topically 2 (two) times daily. 12/27/21   Ellsworth Lennox, PA-C      Allergies    Codeine, Fish allergy, Hydrocodone, Latex, Percocet [oxycodone-acetaminophen], and Shellfish allergy    Review of Systems   Review of Systems  Cardiovascular:  Positive for chest pain.    Physical Exam Updated Vital Signs BP 120/88   Pulse (!) 55   Temp 97.8 F (36.6 C) (Oral)   Resp (!) 7   Ht 5\' 11"  (1.803 m)   Wt (!) 149.7 kg   SpO2 100%   BMI 46.03 kg/m  Physical Exam Vitals and nursing note reviewed.  Constitutional:      General: She is not in acute distress.    Appearance: She is well-developed.   HENT:     Head: Normocephalic and atraumatic.     Mouth/Throat:     Mouth: Mucous membranes are moist.  Eyes:     Pupils: Pupils are equal, round, and reactive to light.  Cardiovascular:     Rate and Rhythm: Regular rhythm. Bradycardia present.     Heart sounds: Normal heart sounds. No murmur heard.    No friction rub.  Pulmonary:     Effort: Pulmonary effort is normal.     Breath sounds: Normal breath sounds. No wheezing or rales.  Chest:     Chest wall: Tenderness present.  Abdominal:     General: Bowel sounds are normal. There is no distension.     Palpations: Abdomen is soft.     Tenderness: There is no abdominal tenderness. There is no guarding or rebound.  Musculoskeletal:        General: Tenderness present. Normal range of motion.     Comments: No edema.  Tenderness with palpation in bilateral calves  Skin:    General: Skin is warm and dry.     Findings: No rash.  Neurological:     Mental Status: She is alert and oriented to person, place, and time. Mental status is at baseline.     Cranial Nerves: No cranial nerve deficit.  Psychiatric:        Mood and Affect: Mood normal.        Behavior: Behavior normal.     ED Results / Procedures / Treatments   Labs (all labs ordered are listed, but only abnormal results are displayed) Labs Reviewed  BASIC METABOLIC PANEL - Abnormal; Notable for the following components:      Result Value   Glucose, Bld 103 (*)    All other components within normal limits  CBC  D-DIMER, QUANTITATIVE  I-STAT BETA HCG BLOOD, ED ([facility], [facility], [facility] ONLY)  TROPONIN I (HIGH SENSITIVITY)  TROPONIN I (HIGH SENSITIVITY)    EKG EKG Interpretation  Date/Time:  Friday October 06 2022 06:23:38 EDT Ventricular Rate:  60 PR Interval:  194 QRS Duration: 100 QT Interval:  490 QTC Calculation: 490 R Axis:   18 Text Interpretation: Normal sinus rhythm Cannot rule out Anterior infarct , age undetermined No significant change since last tracing Confirmed by  Gwyneth Sprout ([id number]) on 10/06/2022 8:10:12 AM  Radiology DG Chest 2 View  Result Date: 10/06/2022 CLINICAL DATA:  Chest pain EXAM: CHEST - 2 VIEW COMPARISON:  12/30/2021 FINDINGS: Normal heart size and mediastinal contours. No acute infiltrate or edema. No effusion or pneumothorax. No acute osseous findings. IMPRESSION: Negative chest. Electronically Signed   By: Tiburcio Pea M.D.   On: 10/06/2022 07:00    Procedures Procedures  Medications Ordered in ED Medications  ketorolac (TORADOL) 15 MG/ML injection 15 mg (15 mg Intravenous Given 10/06/22 1021)  metoCLOPramide (REGLAN) injection 10 mg (10 mg Intravenous Given 10/06/22 1022)    ED Course/ Medical Decision Making/ A&P                             Medical Decision Making Amount and/or Complexity of Data Reviewed External Data Reviewed: notes. Labs: ordered. Decision-making details documented in ED Course. Radiology: ordered and independent interpretation performed. Decision-making details documented in ED Course. ECG/medicine tests: ordered and independent interpretation performed. Decision-making details documented in ED Course.  Risk Prescription drug management.   Pt with multiple medical problems and comorbidities and presenting today with a complaint that caries a high risk for morbidity and mortality.  Here today with complaint of chest pain as well as some tingling in her arm, intermittent shortness of breath.  Patient has no wheezing on exam and denies URI symptoms concerning for bronchitis or COPD.  Patient has no known heart issues and does not have evidence of fluid overload today concerning for CHF.  Lower suspicion for ACS given patient's description of the pain.  Patient is low risk Wells criteria but is a smoker, obese and cannot be PERC'd.  Will rule out with D-dimer.  Low suspicion for dissection at this time as patient's blood pressure is normal and history does not fit.  Low suspicion for GI etiology.  I  independently interpreted patient's EKG which showed no acute findings today, CBC, CMP, D-dimer all within normal limits and troponins x 2 are negative.  Unclear the cause of patient's pain.  May be related to MSK pain versus pleurisy given weather changes and patient does use tobacco products.  At this time as needed meds such as Tylenol or ibuprofen.  Patient does not need any further testing in the emergency room today but did encourage her to follow-up with her neurologist as her headaches are starting to return and she will most likely need to go back on her Diamox.  Discussed the results with her and answered any questions.  At this time patient appears stable for discharge.  She was given return precautions.         Final Clinical Impression(s) / ED Diagnoses Final diagnoses:  Nonspecific chest pain  Other migraine with status migrainosus, intractable    Rx / DC Orders ED Discharge Orders     None         Gwyneth Sprout, MD 10/06/22 1043

## 2022-10-06 NOTE — ED Provider Triage Note (Signed)
Emergency Medicine Provider Triage Evaluation Note  Tabitha Holland , a 37 y.o. female  was evaluated in triage.  Pt complains of L chest pain that started a week ago with shortness of breath and LUE tingling that started yesterday. No history of this. No previous heart, lung, neuro problems. No associated fever, cough, leg pain or swelling, abdominal pain.  Review of Systems  Positive: See HPI Negative: See HPI  Physical Exam  BP 137/89 (BP Location: Right Arm)   Pulse 60   Temp 97.8 F (36.6 C) (Oral)   Resp 18   Ht 5\' 11"  (1.803 m)   Wt (!) 149.7 kg   SpO2 99%   BMI 46.03 kg/m  Gen:   Awake, no distress   Resp:  Normal effort LCTA MSK:   Moves extremities without difficulty  Other:  A&O x 4, 5/5 strength to bilateral UE and LE, CNI, sensation intact RRR without m/r/g  Medical Decision Making  Medically screening exam initiated at 7:19 AM.  Appropriate orders placed.  [name] was informed that the remainder of the evaluation will be completed by another provider, this initial triage assessment does not replace that evaluation, and the importance of remaining in the ED until their evaluation is complete.     Tonette Lederer, PA-C 10/06/22 0981

## 2022-10-12 ENCOUNTER — Other Ambulatory Visit: Payer: Self-pay | Admitting: Neurology

## 2022-10-12 ENCOUNTER — Telehealth: Payer: Self-pay

## 2022-10-12 MED ORDER — BUTALBITAL-APAP-CAFFEINE 50-325-40 MG PO TABS: 1.0000 | ORAL_TABLET | Freq: Three times a day (TID) | ORAL | 0 refills | Status: DC | PRN

## 2022-10-12 MED ORDER — TOPIRAMATE 100 MG PO TABS: 100.0000 mg | ORAL_TABLET | Freq: Two times a day (BID) | ORAL | 6 refills | Status: DC

## 2022-10-12 NOTE — Telephone Encounter (Signed)
Call to patient and advised of Dr. Teresa Coombs recommendations and advise.  Please increase Topiramate to 100 mg twice daily  Trial of Fioricet every 8 hours for headaches  Patient verbalized understanding, no questions at this. She is aware that she is on the wait list for a sooner appt.

## 2022-10-12 NOTE — Telephone Encounter (Signed)
Left message about trying to get an appointment today.

## 2022-10-12 NOTE — Progress Notes (Signed)
Please increase Topiramate to 100 mg twice daily  Trial of Fioricet every 8 hours for headaches

## 2022-10-12 NOTE — Progress Notes (Signed)
Please increase Topiramate to 100 mg twice daily  Trial of Fioricet every 8 hours for headaches  

## 2022-11-21 ENCOUNTER — Other Ambulatory Visit: Payer: Self-pay | Admitting: Student

## 2022-11-21 DIAGNOSIS — F430 Acute stress reaction: Secondary | ICD-10-CM

## 2022-11-27 ENCOUNTER — Encounter: Payer: Self-pay | Admitting: Student

## 2022-11-27 DIAGNOSIS — K047 Periapical abscess without sinus: Secondary | ICD-10-CM

## 2022-11-27 MED ORDER — AMOXICILLIN-POT CLAVULANATE 875-125 MG PO TABS
1.0000 | ORAL_TABLET | Freq: Two times a day (BID) | ORAL | 0 refills | Status: AC
Start: 2022-11-27 — End: 2022-12-04

## 2022-12-25 ENCOUNTER — Encounter (HOSPITAL_COMMUNITY): Payer: Self-pay

## 2022-12-25 ENCOUNTER — Emergency Department (HOSPITAL_COMMUNITY): Payer: Medicaid Other

## 2022-12-25 ENCOUNTER — Other Ambulatory Visit: Payer: Self-pay

## 2022-12-25 ENCOUNTER — Emergency Department (HOSPITAL_COMMUNITY)
Admission: EM | Admit: 2022-12-25 | Discharge: 2022-12-25 | Disposition: A | Discharge status: Home / Self Care | Payer: Medicaid Other | Attending: Emergency Medicine | Admitting: Emergency Medicine

## 2022-12-25 DIAGNOSIS — H538 Other visual disturbances: Secondary | ICD-10-CM | POA: Insufficient documentation

## 2022-12-25 DIAGNOSIS — R200 Anesthesia of skin: Secondary | ICD-10-CM | POA: Diagnosis not present

## 2022-12-25 DIAGNOSIS — Z79899 Other long term (current) drug therapy: Secondary | ICD-10-CM | POA: Diagnosis not present

## 2022-12-25 DIAGNOSIS — I10 Essential (primary) hypertension: Secondary | ICD-10-CM | POA: Insufficient documentation

## 2022-12-25 DIAGNOSIS — R519 Headache, unspecified: Secondary | ICD-10-CM | POA: Diagnosis present

## 2022-12-25 DIAGNOSIS — Z91040 Latex allergy status: Secondary | ICD-10-CM | POA: Diagnosis not present

## 2022-12-25 DIAGNOSIS — G932 Benign intracranial hypertension: Secondary | ICD-10-CM

## 2022-12-25 LAB — CSF CELL COUNT WITH DIFFERENTIAL
RBC Count, CSF: 0 /mm3
Tube #: 3
WBC, CSF: 2 /mm3 (ref 0–5)

## 2022-12-25 LAB — COMPREHENSIVE METABOLIC PANEL
ALT: 13 U/L (ref 0–44)
AST: 15 U/L (ref 15–41)
Albumin: 3.8 g/dL (ref 3.5–5.0)
Alkaline Phosphatase: 89 U/L (ref 38–126)
Anion gap: 6 (ref 5–15)
BUN: 13 mg/dL (ref 6–20)
CO2: 22 mmol/L (ref 22–32)
Calcium: 8.8 mg/dL — ABNORMAL LOW (ref 8.9–10.3)
Chloride: 106 mmol/L (ref 98–111)
Creatinine, Ser: 0.75 mg/dL (ref 0.44–1.00)
GFR, Estimated: >60 mL/min (ref >60)
Glucose, Bld: 100 mg/dL — ABNORMAL HIGH (ref 70–99)
Potassium: 3.2 mmol/L — ABNORMAL LOW (ref 3.5–5.1)
Sodium: 134 mmol/L — ABNORMAL LOW (ref 135–145)
Total Bilirubin: 0.7 mg/dL (ref 0.3–1.2)
Total Protein: 7.6 g/dL (ref 6.5–8.1)

## 2022-12-25 LAB — APTT: aPTT: 31 seconds (ref 24–36)

## 2022-12-25 LAB — I-STAT CHEM 8, ED
BUN: 11 mg/dL (ref 6–20)
Calcium, Ion: 1.18 mmol/L (ref 1.15–1.40)
Chloride: 106 mmol/L (ref 98–111)
Creatinine, Ser: 0.7 mg/dL (ref 0.44–1.00)
Glucose, Bld: 99 mg/dL (ref 70–99)
HCT: 37 % (ref 36.0–46.0)
Hemoglobin: 12.6 g/dL (ref 12.0–15.0)
Potassium: 3.4 mmol/L — ABNORMAL LOW (ref 3.5–5.1)
Sodium: 139 mmol/L (ref 135–145)
TCO2: 20 mmol/L — ABNORMAL LOW (ref 22–32)

## 2022-12-25 LAB — DIFFERENTIAL
Abs Immature Granulocytes: 0.01 10*3/uL (ref 0.00–0.07)
Basophils Absolute: 0 10*3/uL (ref 0.0–0.1)
Basophils Relative: 1 %
Eosinophils Absolute: 0.1 10*3/uL (ref 0.0–0.5)
Eosinophils Relative: 1 %
Immature Granulocytes: 0 %
Lymphocytes Relative: 29 %
Lymphs Abs: 1.7 10*3/uL (ref 0.7–4.0)
Monocytes Absolute: 0.4 10*3/uL (ref 0.1–1.0)
Monocytes Relative: 8 %
Neutro Abs: 3.6 10*3/uL (ref 1.7–7.7)
Neutrophils Relative %: 61 %

## 2022-12-25 LAB — CBC
HCT: 37.2 % (ref 36.0–46.0)
Hemoglobin: 12.1 g/dL (ref 12.0–15.0)
MCH: 27.9 pg (ref 26.0–34.0)
MCHC: 32.5 g/dL (ref 30.0–36.0)
MCV: 85.7 fL (ref 80.0–100.0)
Platelets: 193 10*3/uL (ref 150–400)
RBC: 4.34 MIL/uL (ref 3.87–5.11)
RDW: 14.5 % (ref 11.5–15.5)
WBC: 5.9 10*3/uL (ref 4.0–10.5)
nRBC: 0 % (ref 0.0–0.2)

## 2022-12-25 LAB — PROTEIN AND GLUCOSE, CSF
Glucose, CSF: 58 mg/dL (ref 40–70)
Total  Protein, CSF: 37 mg/dL (ref 15–45)

## 2022-12-25 LAB — CBG MONITORING, ED: Glucose-Capillary: 93 mg/dL (ref 70–99)

## 2022-12-25 LAB — ETHANOL: Alcohol, Ethyl (B): <10 mg/dL (ref <10)

## 2022-12-25 LAB — PROTIME-INR
INR: 1.1 (ref 0.8–1.2)
Prothrombin Time: 13.9 seconds (ref 11.4–15.2)

## 2022-12-25 LAB — HCG, SERUM, QUALITATIVE: Preg, Serum: NEGATIVE

## 2022-12-25 MED ORDER — FUROSEMIDE 40 MG PO TABS
20.0000 mg | ORAL_TABLET | Freq: Once | ORAL | Status: AC
  Administered 2022-12-25: 20 mg via ORAL
  Filled 2022-12-25: qty 1

## 2022-12-25 MED ORDER — LIDOCAINE HCL (PF) 1 % IJ SOLN
5.0000 mL | Freq: Once | INTRAMUSCULAR | Status: AC
  Administered 2022-12-25: 5 mL via INTRADERMAL

## 2022-12-25 MED ORDER — LABETALOL HCL 5 MG/ML IV SOLN
20.0000 mg | Freq: Once | INTRAVENOUS | Status: AC
  Administered 2022-12-25: 20 mg via INTRAVENOUS
  Filled 2022-12-25: qty 4

## 2022-12-25 MED ORDER — HYDROMORPHONE HCL 1 MG/ML IJ SOLN
0.5000 mg | Freq: Once | INTRAMUSCULAR | Status: AC
  Administered 2022-12-25: 0.5 mg via INTRAVENOUS
  Filled 2022-12-25: qty 1

## 2022-12-25 MED ORDER — FUROSEMIDE 20 MG PO TABS: 20.0000 mg | ORAL_TABLET | Freq: Every day | ORAL | 1 refills | Status: DC

## 2022-12-25 MED ORDER — DIPHENHYDRAMINE HCL 50 MG/ML IJ SOLN
25.0000 mg | Freq: Once | INTRAMUSCULAR | Status: AC
  Administered 2022-12-25: 25 mg via INTRAVENOUS
  Filled 2022-12-25: qty 1

## 2022-12-25 MED ORDER — LIDOCAINE HCL (PF) 1 % IJ SOLN
INTRAMUSCULAR | Status: AC
  Filled 2022-12-25: qty 5

## 2022-12-25 MED ORDER — KETOROLAC TROMETHAMINE 30 MG/ML IJ SOLN
30.0000 mg | Freq: Once | INTRAMUSCULAR | Status: AC
  Administered 2022-12-25: 30 mg via INTRAVENOUS
  Filled 2022-12-25: qty 1

## 2022-12-25 MED ORDER — METOCLOPRAMIDE HCL 5 MG/ML IJ SOLN
10.0000 mg | Freq: Once | INTRAMUSCULAR | Status: AC
  Administered 2022-12-25: 10 mg via INTRAVENOUS
  Filled 2022-12-25: qty 2

## 2022-12-25 MED ORDER — ONDANSETRON HCL 4 MG/2ML IJ SOLN
4.0000 mg | Freq: Once | INTRAMUSCULAR | Status: AC
  Administered 2022-12-25: 4 mg via INTRAVENOUS
  Filled 2022-12-25: qty 2

## 2022-12-25 NOTE — ED Triage Notes (Signed)
Pt arrives via EMS from home. Pt reports hx of IIH. States she had a lumbar puncture back in February that helped relieve some of her symptoms. Pt reports for the past 3 weeks she has been experiencing migraines, vision loss to left eye, hearing loss, and numbness to face and all extremities. Pt is AxOx4.

## 2022-12-25 NOTE — ED Notes (Signed)
Urine sent to the lab

## 2022-12-25 NOTE — ED Provider Notes (Signed)
[place] EMERGENCY DEPARTMENT AT [facility] Provider Note   CSN: [id number] Arrival date & time: 12/25/22  5621     History {Add pertinent medical, surgical, social history, OB history to HPI:1} Chief Complaint  Patient presents with   Hypertension   Migraine   Blurred Vision   Numbness    Tabitha Holland is a 37 y.o. female.  Patient has a history of idiopathic intracranial hypertension.  She had an LP back in February and they took 17 cc out and she improved.  Patient states that she started back with a headache in July and has tried to get an appointment with her neurologist.  She now has numbness to her face and starting to get numbness to her hands and feet along with some blurred vision in her left eye and decreased hearing.  The history is provided by the patient and medical records.  Headache Pain location:  Generalized Quality:  Dull Radiates to:  Does not radiate Severity currently:  6/10 Severity at highest:  6/10 Onset quality:  Gradual Timing:  Constant Progression:  Waxing and waning Chronicity:  Recurrent Similar to prior headaches: yes   Context: not activity   Associated symptoms: no abdominal pain, no back pain, no congestion, no cough, no diarrhea, no fatigue, no seizures and no sinus pressure        Home Medications Prior to Admission medications   Medication Sig Start Date End Date Taking? Authorizing Provider  acetaZOLAMIDE (DIAMOX) 250 MG tablet Take 2 tablets (500 mg total) by mouth 2 (two) times daily. 05/17/22 12/13/22  [name], MD  butalbital-acetaminophen-caffeine (FIORICET) 50-325-40 MG tablet Take 1 tablet by mouth every 8 (eight) hours as needed for headache. 10/12/22   [name], MD  FLUoxetine (PROZAC) 20 MG tablet Take 1 tablet (20 mg total) by mouth daily. 08/04/22   [name], MD  LORazepam (ATIVAN) 1 MG tablet TAKE 1 TABLET(1 MG) BY MOUTH TWICE DAILY AS NEEDED FOR ANXIETY OR PANIC 11/21/22   [name], MD  meclizine (ANTIVERT) 12.5 MG tablet Take 12.5 mg by mouth 3 (three) times daily as needed for dizziness.    [provider]  Olmesartan-amLODIPine-HCTZ 40-5-25 MG TABS TAKE 1 TABLET BY MOUTH DAILY 06/23/22   [name], MD  ondansetron (ZOFRAN) 4 MG tablet Take 1 tablet (4 mg total) by mouth every 8 (eight) hours as needed for nausea or vomiting. 05/15/22   Blue, Soijett A, PA-C  Ruxolitinib Phosphate (OPZELURA) 1.5 % CREA Apply 1 Application topically daily as needed (eczema).    [provider]  spironolactone (ALDACTONE) 50 MG tablet TAKE 1 TABLET(50 MG) BY MOUTH AT BEDTIME 08/30/22   [name], MD  topiramate (TOPAMAX) 100 MG tablet Take 1 tablet (100 mg total) by mouth 2 (two) times daily. 10/12/22 05/10/23  [name], MD  triamcinolone cream (KENALOG) 0.1 % Apply 1 Application topically 2 (two) times daily. 12/27/21   Ellsworth Lennox, PA-C      Allergies    Codeine, Fish allergy, Hydrocodone, Latex, Percocet [oxycodone-acetaminophen], and Shellfish allergy    Review of Systems   Review of Systems  Constitutional:  Negative for appetite change and fatigue.  HENT:  Negative for congestion, ear discharge and sinus pressure.   Eyes:  Negative for discharge.  Respiratory:  Negative for cough.   Cardiovascular:  Negative for chest pain.  Gastrointestinal:  Negative for abdominal pain and diarrhea.  Genitourinary:  Negative for frequency and hematuria.  Musculoskeletal:  Negative for back pain.  Skin:  Negative for rash.  Neurological:  Positive for headaches. Negative for seizures.  Psychiatric/Behavioral:  Negative for hallucinations.     Physical Exam Updated Vital Signs BP 126/77 (BP Location: Left Arm)   Pulse (!) 53   Temp 97.8 F (36.6 C) (Oral)   Resp 16   Ht 5\' 11"  (1.803 m)   Wt (!) 158.8 kg   SpO2 99%   BMI 48.82 kg/m  Physical Exam Vitals and nursing note reviewed.  Constitutional:      Appearance: She is well-developed.   HENT:     Head: Normocephalic.     Nose: Nose normal.  Eyes:     General: No scleral icterus.    Conjunctiva/sclera: Conjunctivae normal.     Comments: Poor vision in left eye  Neck:     Thyroid: No thyromegaly.  Cardiovascular:     Rate and Rhythm: Normal rate and regular rhythm.     Heart sounds: No murmur heard.    No friction rub. No gallop.  Pulmonary:     Breath sounds: No stridor. No wheezing or rales.  Chest:     Chest wall: No tenderness.  Abdominal:     General: There is no distension.     Tenderness: There is no abdominal tenderness. There is no rebound.  Musculoskeletal:        General: Normal range of motion.     Cervical back: Neck supple.  Lymphadenopathy:     Cervical: No cervical adenopathy.  Skin:    Findings: No erythema or rash.  Neurological:     Mental Status: She is alert and oriented to person, place, and time.     Motor: No abnormal muscle tone.     Coordination: Coordination normal.  Psychiatric:        Behavior: Behavior normal.     ED Results / Procedures / Treatments   Labs (all labs ordered are listed, but only abnormal results are displayed) Labs Reviewed  COMPREHENSIVE METABOLIC PANEL - Abnormal; Notable for the following components:      Result Value   Sodium 134 (*)    Potassium 3.2 (*)    Glucose, Bld 100 (*)    Calcium 8.8 (*)    All other components within normal limits  I-STAT CHEM 8, ED - Abnormal; Notable for the following components:   Potassium 3.4 (*)    TCO2 20 (*)    All other components within normal limits  PROTIME-INR  APTT  CBC  DIFFERENTIAL  ETHANOL  HCG, SERUM, QUALITATIVE  CSF CELL COUNT WITH DIFFERENTIAL  PROTEIN AND GLUCOSE, CSF  CBG MONITORING, ED    EKG EKG Interpretation Date/Time:  Monday December 25 2022 08:20:14 EDT Ventricular Rate:  66 PR Interval:  168 QRS Duration:  106 QT Interval:  447 QTC Calculation: 469 R Axis:   45  Text Interpretation: Sinus arrhythmia Borderline T  abnormalities, anterior leads Confirmed by Bethann Berkshire [phone]) on 12/25/2022 9:43:51 AM  Radiology DG Lumbar Puncture Fluoro Guide  Result Date: 12/25/2022 CLINICAL DATA:  Idiopathic intracranial hypertension. EXAM: DIAGNOSTIC LUMBAR PUNCTURE UNDER FLUOROSCOPIC GUIDANCE COMPARISON:  None Available. FLUOROSCOPY: Radiation Exposure Index (as provided by the fluoroscopic device): 10.9 mGy Kerma PROCEDURE: Informed consent was obtained from the patient prior to the procedure, including potential complications of headache, allergy, and pain. With the patient prone, the lower back was prepped with Betadine. 1% Lidocaine was used for local anesthesia. Lumbar puncture was performed at the  L3-4 level using a 20 gauge needle with return of clear CSF. 17.25 ml of CSF were obtained for laboratory studies. The patient tolerated the procedure well and there were no apparent complications. IMPRESSION: Successful lumbar puncture under fluoroscopy with withdrawal of 17.25 cc clear CSF. Electronically Signed   By: Leanna Battles M.D.   On: 12/25/2022 14:00   CT Head Wo Contrast  Result Date: 12/25/2022 CLINICAL DATA:  Provided history: Headache, increasing frequency or severity. EXAM: CT HEAD WITHOUT CONTRAST TECHNIQUE: Contiguous axial images were obtained from the base of the skull through the vertex without intravenous contrast. RADIATION DOSE REDUCTION: This exam was performed according to the departmental dose-optimization program which includes automated exposure control, adjustment of the mA and/or kV according to patient size and/or use of iterative reconstruction technique. COMPARISON:  Head CT 05/15/2022. FINDINGS: Brain: Cerebral volume is normal. Partially empty sella turcica. There is no acute intracranial hemorrhage. No demarcated cortical infarct. No extra-axial fluid collection. No evidence of an intracranial mass. No midline shift. Vascular: No hyperdense vessel. Skull: No calvarial fracture or aggressive  osseous lesion. Sinuses/Orbits: No orbital mass or acute orbital finding. No significant paranasal sinus disease at the imaged levels. IMPRESSION: 1. No evidence of an acute intracranial abnormality. 2. Partially empty sella turcica. This finding can reflect incidental anatomic variation, or alternatively, it can be associated with idiopathic intracranial hypertension (pseudotumor cerebri). Electronically Signed   By: Jackey Loge D.O.   On: 12/25/2022 09:06    Procedures Procedures  {Document cardiac monitor, telemetry assessment procedure when appropriate:1}  Medications Ordered in ED Medications  furosemide (LASIX) tablet 20 mg (has no administration in time range)  labetalol (NORMODYNE) injection 20 mg (20 mg Intravenous Given 12/25/22 0908)  HYDROmorphone (DILAUDID) injection 0.5 mg (0.5 mg Intravenous Given 12/25/22 0908)  ondansetron (ZOFRAN) injection 4 mg (4 mg Intravenous Given 12/25/22 0907)  ketorolac (TORADOL) 30 MG/ML injection 30 mg (30 mg Intravenous Given 12/25/22 1007)  diphenhydrAMINE (BENADRYL) injection 25 mg (25 mg Intravenous Given 12/25/22 1006)  metoCLOPramide (REGLAN) injection 10 mg (10 mg Intravenous Given 12/25/22 1006)  lidocaine (PF) (XYLOCAINE) 1 % injection 5 mL (5 mLs Intradermal Given 12/25/22 1313)    ED Course/ Medical Decision Making/ A&P   {  CRITICAL CARE Performed by: Bethann Berkshire Total critical care time: 50 minutes Critical care time was exclusive of separately billable procedures and treating other patients. Critical care was necessary to treat or prevent imminent or life-threatening deterioration. Critical care was time spent personally by me on the following activities: development of treatment plan with patient and/or surrogate as well as nursing, discussions with consultants, evaluation of patient's response to treatment, examination of patient, obtaining history from patient or surrogate, ordering and performing treatments and interventions,  ordering and review of laboratory studies, ordering and review of radiographic studies, pulse oximetry and re-evaluation of patient's condition.      I spoke to neurology Dr.Khaligdina and he reviewed her medicines.  He stated that we should start Lasix 20 mg a day and we will get an LP today to remove fluid.  She is to follow-up with neurology and ophthalmology.  Patient got the LP and they removed 17 cc of fluid. Click here for ABCD2, HEART and other calculatorsREFRESH Note before signing :1}                              Medical Decision Making Amount and/or Complexity of Data Reviewed Labs:  ordered. Radiology: ordered.  Risk Prescription drug management.   Patient with idiopathic intracranial hypertension.  She had an LP today and 17 cc removed.  She is having improvement of her headache.  She started on Lasix and will follow-up with neurology and ophthalmology {Document critical care time when appropriate:1} {Document review of labs and clinical decision tools ie heart score, Chads2Vasc2 etc:1}  {Document your independent review of radiology images, and any outside records:1} {Document your discussion with family members, caretakers, and with consultants:1} {Document social determinants of health affecting pt's care:1} {Document your decision making why or why not admission, treatments were needed:1} Final Clinical Impression(s) / ED Diagnoses Final diagnoses:  None    Rx / DC Orders ED Discharge Orders     None

## 2022-12-25 NOTE — ED Notes (Signed)
Pt to IR

## 2022-12-25 NOTE — Discharge Instructions (Addendum)
Start taking the Lasix tomorrow.  Follow-up with your neurologist within the next couple weeks and the ophthalmologist in a week.

## 2022-12-26 ENCOUNTER — Telehealth: Payer: Self-pay | Admitting: Neurology

## 2022-12-26 NOTE — Telephone Encounter (Signed)
Error

## 2022-12-27 ENCOUNTER — Emergency Department (HOSPITAL_COMMUNITY): Payer: Medicaid Other

## 2022-12-27 ENCOUNTER — Encounter (HOSPITAL_COMMUNITY): Payer: Self-pay

## 2022-12-27 ENCOUNTER — Emergency Department (HOSPITAL_COMMUNITY)
Admission: EM | Admit: 2022-12-27 | Discharge: 2022-12-27 | Disposition: A | Discharge status: Home / Self Care | Payer: Medicaid Other

## 2022-12-27 ENCOUNTER — Other Ambulatory Visit: Payer: Self-pay

## 2022-12-27 DIAGNOSIS — R209 Unspecified disturbances of skin sensation: Secondary | ICD-10-CM | POA: Diagnosis not present

## 2022-12-27 DIAGNOSIS — R519 Headache, unspecified: Secondary | ICD-10-CM | POA: Diagnosis present

## 2022-12-27 DIAGNOSIS — Z91040 Latex allergy status: Secondary | ICD-10-CM | POA: Insufficient documentation

## 2022-12-27 DIAGNOSIS — I10 Essential (primary) hypertension: Secondary | ICD-10-CM | POA: Diagnosis not present

## 2022-12-27 DIAGNOSIS — R531 Weakness: Secondary | ICD-10-CM | POA: Insufficient documentation

## 2022-12-27 LAB — COMPREHENSIVE METABOLIC PANEL
ALT: 16 U/L (ref 0–44)
AST: 14 U/L — ABNORMAL LOW (ref 15–41)
Albumin: 4.4 g/dL (ref 3.5–5.0)
Alkaline Phosphatase: 104 U/L (ref 38–126)
Anion gap: 11 (ref 5–15)
BUN: 13 mg/dL (ref 6–20)
CO2: 21 mmol/L — ABNORMAL LOW (ref 22–32)
Calcium: 9.9 mg/dL (ref 8.9–10.3)
Chloride: 104 mmol/L (ref 98–111)
Creatinine, Ser: 0.93 mg/dL (ref 0.44–1.00)
GFR, Estimated: >60 mL/min (ref >60)
Glucose, Bld: 79 mg/dL (ref 70–99)
Potassium: 3.6 mmol/L (ref 3.5–5.1)
Sodium: 136 mmol/L (ref 135–145)
Total Bilirubin: 0.4 mg/dL (ref 0.3–1.2)
Total Protein: 8.8 g/dL — ABNORMAL HIGH (ref 6.5–8.1)

## 2022-12-27 LAB — CBC WITH DIFFERENTIAL/PLATELET
Abs Immature Granulocytes: 0.03 10*3/uL (ref 0.00–0.07)
Basophils Absolute: 0 10*3/uL (ref 0.0–0.1)
Basophils Relative: 0 %
Eosinophils Absolute: 0.1 10*3/uL (ref 0.0–0.5)
Eosinophils Relative: 1 %
HCT: 42 % (ref 36.0–46.0)
Hemoglobin: 13.5 g/dL (ref 12.0–15.0)
Immature Granulocytes: 0 %
Lymphocytes Relative: 31 %
Lymphs Abs: 2.8 10*3/uL (ref 0.7–4.0)
MCH: 28.5 pg (ref 26.0–34.0)
MCHC: 32.1 g/dL (ref 30.0–36.0)
MCV: 88.6 fL (ref 80.0–100.0)
Monocytes Absolute: 0.5 10*3/uL (ref 0.1–1.0)
Monocytes Relative: 6 %
Neutro Abs: 5.4 10*3/uL (ref 1.7–7.7)
Neutrophils Relative %: 62 %
Platelets: 239 10*3/uL (ref 150–400)
RBC: 4.74 MIL/uL (ref 3.87–5.11)
RDW: 14.7 % (ref 11.5–15.5)
WBC: 8.8 10*3/uL (ref 4.0–10.5)
nRBC: 0 % (ref 0.0–0.2)

## 2022-12-27 LAB — PREGNANCY, URINE: Preg Test, Ur: NEGATIVE

## 2022-12-27 MED ORDER — GADOBUTROL 1 MMOL/ML IV SOLN
10.0000 mL | Freq: Once | INTRAVENOUS | Status: AC | PRN
  Administered 2022-12-27: 10 mL via INTRAVENOUS

## 2022-12-27 MED ORDER — KETOROLAC TROMETHAMINE 15 MG/ML IJ SOLN
15.0000 mg | Freq: Once | INTRAMUSCULAR | Status: AC
  Administered 2022-12-27: 15 mg via INTRAVENOUS
  Filled 2022-12-27: qty 1

## 2022-12-27 MED ORDER — FUROSEMIDE 10 MG/ML IJ SOLN
20.0000 mg | Freq: Once | INTRAMUSCULAR | Status: AC
  Administered 2022-12-27: 20 mg via INTRAVENOUS
  Filled 2022-12-27: qty 4

## 2022-12-27 MED ORDER — LACTATED RINGERS IV BOLUS
1000.0000 mL | Freq: Once | INTRAVENOUS | Status: AC
  Administered 2022-12-27: 1000 mL via INTRAVENOUS

## 2022-12-27 MED ORDER — ACETAMINOPHEN 325 MG PO TABS
650.0000 mg | ORAL_TABLET | Freq: Once | ORAL | Status: AC
  Administered 2022-12-27: 650 mg via ORAL
  Filled 2022-12-27: qty 2

## 2022-12-27 MED ORDER — PROCHLORPERAZINE EDISYLATE 10 MG/2ML IJ SOLN
10.0000 mg | Freq: Once | INTRAMUSCULAR | Status: AC
  Administered 2022-12-27: 10 mg via INTRAVENOUS
  Filled 2022-12-27: qty 2

## 2022-12-27 NOTE — ED Provider Notes (Signed)
[place] EMERGENCY DEPARTMENT AT [facility] Provider Note   CSN: [id number] Arrival date & time: 12/27/22  1146     History  Chief Complaint  Patient presents with   Numbness         Tabitha Holland is a 37 y.o. female.  37 year old female history of idiopathic intracranial hypertension, migraines presenting the emergency department for headache.  Reports headache started Saturday has been ongoing since that time.  She was seen 2 days ago had a lumbar puncture.  Was supposed to be taking Lasix.,  But did not pick up medication.  She reports that her symptoms persist today.  Notes that while she is at work she had some brief hand weakness and was told by coworker that her face had some drooping.  She did not overtly noted.  No aphasia, dysarthria, vision changes, unilateral weakness.  She notes that she continues to have facial paresthesias as well as paresthesias to bilateral hands into the bottom of the feet.  These are unchanged since evaluation 2 days ago.        Home Medications Prior to Admission medications   Medication Sig Start Date End Date Taking? Authorizing Provider  acetaZOLAMIDE (DIAMOX) 250 MG tablet Take 2 tablets (500 mg total) by mouth 2 (two) times daily. 05/17/22 12/13/22  [name], MD  butalbital-acetaminophen-caffeine (FIORICET) 50-325-40 MG tablet Take 1 tablet by mouth every 8 (eight) hours as needed for headache. 10/12/22   [name], MD  FLUoxetine (PROZAC) 20 MG tablet Take 1 tablet (20 mg total) by mouth daily. 08/04/22   [name], MD  furosemide (LASIX) 20 MG tablet Take 1 tablet (20 mg total) by mouth daily. 12/25/22   Bethann Berkshire, MD  LORazepam (ATIVAN) 1 MG tablet TAKE 1 TABLET(1 MG) BY MOUTH TWICE DAILY AS NEEDED FOR ANXIETY OR PANIC 11/21/22   [name], MD  meclizine (ANTIVERT) 12.5 MG tablet Take 12.5 mg by mouth 3 (three) times daily as needed for dizziness.    [provider]   Olmesartan-amLODIPine-HCTZ 40-5-25 MG TABS TAKE 1 TABLET BY MOUTH DAILY 06/23/22   [name], MD  ondansetron (ZOFRAN) 4 MG tablet Take 1 tablet (4 mg total) by mouth every 8 (eight) hours as needed for nausea or vomiting. 05/15/22   Blue, Soijett A, PA-C  Ruxolitinib Phosphate (OPZELURA) 1.5 % CREA Apply 1 Application topically daily as needed (eczema).    [provider]  spironolactone (ALDACTONE) 50 MG tablet TAKE 1 TABLET(50 MG) BY MOUTH AT BEDTIME 08/30/22   [name], MD  topiramate (TOPAMAX) 100 MG tablet Take 1 tablet (100 mg total) by mouth 2 (two) times daily. 10/12/22 05/10/23  [name], MD  triamcinolone cream (KENALOG) 0.1 % Apply 1 Application topically 2 (two) times daily. 12/27/21   Ellsworth Lennox, PA-C      Allergies    Codeine, Fish allergy, Hydrocodone, Latex, Percocet [oxycodone-acetaminophen], and Shellfish allergy    Review of Systems   Review of Systems  Physical Exam Updated Vital Signs BP (!) 148/78 (BP Location: Left Arm)   Pulse (!) 57   Temp 98 F (36.7 C) (Oral)   Resp 18   Ht 5\' 11"  (1.803 m)   Wt (!) 158.8 kg   SpO2 100%   BMI 48.82 kg/m  Physical Exam Vitals and nursing note reviewed.  Constitutional:      Appearance: She is obese. She is not toxic-appearing.  HENT:     Mouth/Throat:  Mouth: Mucous membranes are moist.  Eyes:     Extraocular Movements: Extraocular movements intact.     Conjunctiva/sclera: Conjunctivae normal.     Pupils: Pupils are equal, round, and reactive to light.  Cardiovascular:     Rate and Rhythm: Normal rate and regular rhythm.  Pulmonary:     Effort: Pulmonary effort is normal.     Breath sounds: Normal breath sounds.  Abdominal:     General: Abdomen is flat. There is no distension.     Palpations: Abdomen is soft.     Tenderness: There is no abdominal tenderness. There is no guarding or rebound.  Musculoskeletal:        General: Normal range of motion.     Cervical back: Normal range  of motion.  Skin:    General: Skin is warm and dry.     Capillary Refill: Capillary refill takes less than 2 seconds.  Neurological:     Mental Status: She is alert.     Cranial Nerves: No cranial nerve deficit.     Sensory: Sensory deficit present.     Motor: No weakness.     Comments: Abnormal sensorium to the face, bilateral hands and to the bottom of the feet.  Normal sensation to the upper arms and forearms, normal sensation to the tops of feet shins and thighs.  She has good and equal strength in all extremities.  Normal finger-nose, normal heel-to-shin.  No aphasia or dysarthria.  Psychiatric:        Mood and Affect: Mood normal.        Behavior: Behavior normal.     ED Results / Procedures / Treatments   Labs (all labs ordered are listed, but only abnormal results are displayed) Labs Reviewed  COMPREHENSIVE METABOLIC PANEL - Abnormal; Notable for the following components:      Result Value   CO2 21 (*)    Total Protein 8.8 (*)    AST 14 (*)    All other components within normal limits  CBC WITH DIFFERENTIAL/PLATELET  PREGNANCY, URINE    EKG None  Radiology No results found.  Procedures Procedures    Medications Ordered in ED Medications  furosemide (LASIX) injection 20 mg (has no administration in time range)  prochlorperazine (COMPAZINE) injection 10 mg (10 mg Intravenous Given 12/27/22 1522)  ketorolac (TORADOL) 15 MG/ML injection 15 mg (15 mg Intravenous Given 12/27/22 1522)  lactated ringers bolus 1,000 mL (1,000 mLs Intravenous New Bag/Given 12/27/22 1522)  acetaminophen (TYLENOL) tablet 650 mg (650 mg Oral Given 12/27/22 1611)  gadobutrol (GADAVIST) 1 MMOL/ML injection 10 mL (10 mLs Intravenous Contrast Given 12/27/22 1710)    ED Course/ Medical Decision Making/ A&P Clinical Course as of 12/27/22 1716  Wed Dec 27, 2022  1511 Spoke with neurology regarding patient presentation today.  Recommending MR brain as well as MR venogram.  Also recommending  treating with migraine cocktail and giving dose of Lasix here.  If MR imaging reassuring would discharge and continue Lasix outpatient. [TY]  1659 Stable 37 YOF  with hx of IIH. Atypical neurologic symptoms. Neuro consulted. MRIs pending. Prescribed lasix, didn't start. Had LP 2 days ago with 17ccs out. No sxs change per patient.  [CC]    Clinical Course User Index [CC] Glyn Ade, MD [TY] Coral Spikes, DO  Medical Decision Making Well-appearing 37 year old female history of idiopathic intracranial hypertension presents emergency department for continued headache with persistent paresthesias to the face, hands and feet.  Had short episode of left hand weakness while at work.  Symptoms seemingly resolved.  Has a reassuring neuroexam with no localizing deficits.  She does have some abnormal sensorium to the hands, face and bottoms of feet.  Case discussed with neurology.  See ED course further recommendations.  Migraine cocktail ordered.  Lasix ordered at request of neurology.  MRI is pending.  Care signed out to afternoon team; disposition pending their results.  Amount and/or Complexity of Data Reviewed External Data Reviewed: notes.    Details: Lumbar puncture yesterday with 17 cc removed. Radiology: ordered.  Risk OTC drugs. Prescription drug management.         Final Clinical Impression(s) / ED Diagnoses Final diagnoses:  None    Rx / DC Orders ED Discharge Orders     None         Coral Spikes, DO 12/27/22 1716

## 2022-12-27 NOTE — ED Provider Notes (Signed)
Care of patient received from prior provider at 5:45 PM, please see their note for complete H/P and care plan.  Received handoff per ED course.  Clinical Course as of 12/27/22 1745  Wed Dec 27, 2022  1511 Spoke with neurology regarding patient presentation today.  Recommending MR brain as well as MR venogram.  Also recommending treating with migraine cocktail and giving dose of Lasix here.  If MR imaging reassuring would discharge and continue Lasix outpatient. [TY]  1659 Stable 37 YOF  with hx of IIH. Atypical neurologic symptoms. Neuro consulted. MRIs pending. Prescribed lasix, didn't start. Had LP 2 days ago with 17ccs out. No sxs change per patient.  [CC]  1744 Reassessed at bedside no acute distress.  Patient feels comfortable discharge with follow-up with neurology already scheduled.  She stated that she had received a phone call that her Lasix is ready for pickup. [CC]    Clinical Course User Index [CC] Glyn Ade, MD [TY] Vincente Liberty, MD 12/27/22 [phone]

## 2022-12-27 NOTE — ED Provider Triage Note (Signed)
Emergency Medicine Provider Triage Evaluation Note  Tabitha Holland , a 37 y.o. female with h/o IIH  was evaluated in triage.  Pt complains of headache with numbness to her whole face, bilateral hands, and bilateral feet since Saturday. She was seen here two days ago and has been having persistent symptoms. She reports taht usually these improve after LP, but they did not. She reports that her friend thought that she may have had a facial droop, but the patient did not feel like she had a droop, but did not look. No facial droop now. She denies any worsening in headaches.   Review of Systems  Positive:  Negative:   Physical Exam  BP (!) 155/101 (BP Location: Right Arm)   Pulse (!) 53   Temp 98.6 F (37 C) (Oral)   Resp 17   Ht 5\' 11"  (1.803 m)   Wt (!) 158.8 kg   SpO2 100%   BMI 48.82 kg/m  Gen:   Awake, no distress   Resp:  Normal effort  MSK:   Moves extremities without difficulty  Other:  Cranial nerves grossly intact. Moving all extremities. No facial droop noted. Answering questions appropriately.  Medical Decision Making  Medically screening exam initiated at 12:24 PM.  Appropriate orders placed.  [name] was informed that the remainder of the evaluation will be completed by another provider, this initial triage assessment does not replace that evaluation, and the importance of remaining in the ED until their evaluation is complete.  Labs ordered.   Achille Rich, PA-C 12/27/22 1227

## 2022-12-27 NOTE — ED Triage Notes (Signed)
Pt seen Monday for migraines/vision loss/hearing loss/ and numbness to face and extremities. Pt has Hx of IIH and received a lumbar puncture for treatment. Pt reports no relief of s/s, co-worker also reported to pt that she had facial droop today. Pt did not check herself to observe facial droop, symptom not present at time of triage.

## 2022-12-27 NOTE — ED Notes (Addendum)
1611 : Delay in lasix due to MRI taking pt next 1638: Pt to MRI

## 2022-12-29 ENCOUNTER — Encounter: Payer: Self-pay | Admitting: Student

## 2022-12-29 ENCOUNTER — Ambulatory Visit (INDEPENDENT_AMBULATORY_CARE_PROVIDER_SITE_OTHER): Payer: Medicaid Other | Admitting: Student

## 2022-12-29 VITALS — BP 128/86 | HR 83 | Ht 71.0 in | Wt 357.5 lb

## 2022-12-29 DIAGNOSIS — G932 Benign intracranial hypertension: Secondary | ICD-10-CM

## 2022-12-29 NOTE — Progress Notes (Signed)
    SUBJECTIVE:   CHIEF COMPLAINT / HPI:    Patient is a 37 year old female with history of IIH presenting for worsening headache. She reports worsening of symptoms 2-3 weeks ago that  started with significant headache causing her to take time off work Recent vision left eye vision loss 6 days ago that lasted for 2 days. This happened back in January  when she was initially diagnosed and LP provided significant relief Endorses Bilateral partial hearing loss worse on the left recently which is new of patient Other associated symptoms includes face, bilateral hands,  and feet and numbness Recently went to ED where LP was performed and 17cc removed. She was also started on Lasix 20mg  daily Patient is currently on high dose Topomax and Diamox 500mg  BID She can't go up on the Diamox because is causes SOB. Next apt with Neurologist is sept 30th, can't get any earlier appointment   PERTINENT  PMH / PSH: Reviewed   OBJECTIVE:   BP 128/86   Pulse 83   Ht 5\' 11"  (1.803 m)   Wt (!) 357 lb 8 oz (162.2 kg)   LMP 11/23/2022   BMI 49.86 kg/m    Physical Exam General: Alert, well appearing, NAD Cardiovascular: RRR, No Murmurs, Normal S2/S2 Respiratory: CTAB, No wheezing or Rales Abdomen: No distension or tenderness Extremities: No edema on extremities   Neuro exam: Muscle strength in tact, sensation diminished in face and all extremities   ASSESSMENT/PLAN:   Idiopathic Intracranial Hypertension  I spoke to neurology Dr.Khaligdina who recommended patient going to the ED to get LP for symptom relief. He expressed patient would need at least 30 cc of fluid removed.  So given the patient having difficulty going up on her Diamox and on high-dose Topamax suspect patient could have failed medication therapy and could need optic nerve fenestration procedure shunting for management at this point. Advised patient to go to the ED for LP and to discuss symptoms with ophthalmologist at next appointment  next week.Patient verbalized understanding and agreeable to plan  Jerre Simon, MD [facility]

## 2022-12-29 NOTE — Patient Instructions (Addendum)
It was wonderful to see you today. Thank you for allowing me to be a part of your care. Below is a short summary of what we discussed at your visit today:  I spoke to the neurologist who recommend that it would be important for you to return to the ED to get lumbar puncture to see if they can get more spinal fluid out to see if this improves your symptoms.  Being that you cannot tolerate any increase in your medication as it is right now.  Please make sure you follow-up with your ophthalmologist appointment next week and talk to them about possibly considering optic nerve fenestration procedure.  Discussed with them about your vision losses.  You might end up needing a neurosurgeon to assess you for shunt placement  Please bring all of your medications to every appointment!  If you have any questions or concerns, please do not hesitate to contact us via phone or MyChart message.   Jerre Simon, MD [facility]

## 2022-12-30 ENCOUNTER — Other Ambulatory Visit: Payer: Self-pay

## 2022-12-30 ENCOUNTER — Emergency Department (HOSPITAL_COMMUNITY)
Admission: EM | Admit: 2022-12-30 | Discharge: 2022-12-30 | Disposition: A | Discharge status: Home / Self Care | Payer: Medicaid Other | Attending: Emergency Medicine | Admitting: Emergency Medicine

## 2022-12-30 ENCOUNTER — Encounter (HOSPITAL_COMMUNITY): Payer: Self-pay

## 2022-12-30 DIAGNOSIS — Z91040 Latex allergy status: Secondary | ICD-10-CM | POA: Diagnosis not present

## 2022-12-30 DIAGNOSIS — G932 Benign intracranial hypertension: Secondary | ICD-10-CM | POA: Diagnosis not present

## 2022-12-30 HISTORY — DX: Benign intracranial hypertension: G93.2

## 2022-12-30 LAB — CBC WITH DIFFERENTIAL/PLATELET
Abs Immature Granulocytes: 0.02 10*3/uL (ref 0.00–0.07)
Basophils Absolute: 0 10*3/uL (ref 0.0–0.1)
Basophils Relative: 0 %
Eosinophils Absolute: 0.1 10*3/uL (ref 0.0–0.5)
Eosinophils Relative: 1 %
HCT: 40.9 % (ref 36.0–46.0)
Hemoglobin: 13.3 g/dL (ref 12.0–15.0)
Immature Granulocytes: 0 %
Lymphocytes Relative: 30 %
Lymphs Abs: 2.7 10*3/uL (ref 0.7–4.0)
MCH: 28.2 pg (ref 26.0–34.0)
MCHC: 32.5 g/dL (ref 30.0–36.0)
MCV: 86.7 fL (ref 80.0–100.0)
Monocytes Absolute: 0.6 10*3/uL (ref 0.1–1.0)
Monocytes Relative: 6 %
Neutro Abs: 5.6 10*3/uL (ref 1.7–7.7)
Neutrophils Relative %: 63 %
Platelets: 226 10*3/uL (ref 150–400)
RBC: 4.72 MIL/uL (ref 3.87–5.11)
RDW: 14.4 % (ref 11.5–15.5)
WBC: 9 10*3/uL (ref 4.0–10.5)
nRBC: 0 % (ref 0.0–0.2)

## 2022-12-30 LAB — BASIC METABOLIC PANEL
Anion gap: 10 (ref 5–15)
BUN: 12 mg/dL (ref 6–20)
CO2: 18 mmol/L — ABNORMAL LOW (ref 22–32)
Calcium: 9.1 mg/dL (ref 8.9–10.3)
Chloride: 106 mmol/L (ref 98–111)
Creatinine, Ser: 0.87 mg/dL (ref 0.44–1.00)
GFR, Estimated: >60 mL/min (ref >60)
Glucose, Bld: 88 mg/dL (ref 70–99)
Potassium: 3.5 mmol/L (ref 3.5–5.1)
Sodium: 134 mmol/L — ABNORMAL LOW (ref 135–145)

## 2022-12-30 MED ORDER — LIDOCAINE HCL (PF) 1 % IJ SOLN
INTRAMUSCULAR | Status: AC
  Filled 2022-12-30: qty 30

## 2022-12-30 MED ORDER — ACETAMINOPHEN 500 MG PO TABS
1000.0000 mg | ORAL_TABLET | Freq: Once | ORAL | Status: AC
  Administered 2022-12-30: 1000 mg via ORAL
  Filled 2022-12-30: qty 2

## 2022-12-30 MED ORDER — KETOROLAC TROMETHAMINE 30 MG/ML IJ SOLN
30.0000 mg | Freq: Once | INTRAMUSCULAR | Status: AC
  Administered 2022-12-30: 30 mg via INTRAMUSCULAR
  Filled 2022-12-30: qty 1

## 2022-12-30 NOTE — ED Provider Notes (Signed)
[place] EMERGENCY DEPARTMENT AT [facility] Provider Note   CSN: [id number] Arrival date & time: 12/30/22  1016     History  Chief Complaint  Patient presents with   lumbar puncture    Tabitha Holland is a 37 y.o. female.  This is a 37 year old female here today because she needs lumbar puncture.  She was recently diagnosed with IIH.  Her PCP was not able to do a lumbar puncture, spoke with the neurologist, and they wanted her to come to the emergency department for a lumbar puncture.        Home Medications Prior to Admission medications   Medication Sig Start Date End Date Taking? Authorizing Provider  acetaZOLAMIDE (DIAMOX) 250 MG tablet Take 2 tablets (500 mg total) by mouth 2 (two) times daily. 05/17/22 12/13/22  [name], MD  butalbital-acetaminophen-caffeine (FIORICET) 50-325-40 MG tablet Take 1 tablet by mouth every 8 (eight) hours as needed for headache. 10/12/22   [name], MD  FLUoxetine (PROZAC) 20 MG tablet Take 1 tablet (20 mg total) by mouth daily. 08/04/22   [name], MD  furosemide (LASIX) 20 MG tablet Take 1 tablet (20 mg total) by mouth daily. 12/25/22   Bethann Berkshire, MD  LORazepam (ATIVAN) 1 MG tablet TAKE 1 TABLET(1 MG) BY MOUTH TWICE DAILY AS NEEDED FOR ANXIETY OR PANIC 11/21/22   [name], MD  meclizine (ANTIVERT) 12.5 MG tablet Take 12.5 mg by mouth 3 (three) times daily as needed for dizziness.    [provider]  Olmesartan-amLODIPine-HCTZ 40-5-25 MG TABS TAKE 1 TABLET BY MOUTH DAILY 06/23/22   [name], MD  ondansetron (ZOFRAN) 4 MG tablet Take 1 tablet (4 mg total) by mouth every 8 (eight) hours as needed for nausea or vomiting. 05/15/22   Blue, Soijett A, PA-C  Ruxolitinib Phosphate (OPZELURA) 1.5 % CREA Apply 1 Application topically daily as needed (eczema).    [provider]  spironolactone (ALDACTONE) 50 MG tablet TAKE 1 TABLET(50 MG) BY MOUTH AT BEDTIME 08/30/22   [name], MD   topiramate (TOPAMAX) 100 MG tablet Take 1 tablet (100 mg total) by mouth 2 (two) times daily. 10/12/22 05/10/23  [name], MD  triamcinolone cream (KENALOG) 0.1 % Apply 1 Application topically 2 (two) times daily. 12/27/21   Ellsworth Lennox, PA-C      Allergies    Codeine, Fish allergy, Hydrocodone, Latex, Percocet [oxycodone-acetaminophen], and Shellfish allergy    Review of Systems   Review of Systems  Physical Exam Updated Vital Signs BP 111/68 (BP Location: Right Arm)   Pulse 87   Temp 98.1 F (36.7 C) (Oral)   Resp 18   Ht 5\' 11"  (1.803 m)   Wt (!) 162.2 kg   LMP 11/23/2022   SpO2 100%   BMI 49.87 kg/m  Physical Exam Vitals reviewed.  HENT:     Head: Normocephalic and atraumatic.  Neurological:     General: No focal deficit present.     Gait: Gait normal.     ED Results / Procedures / Treatments   Labs (all labs ordered are listed, but only abnormal results are displayed) Labs Reviewed  BASIC METABOLIC PANEL - Abnormal; Notable for the following components:      Result Value   Sodium 134 (*)    CO2 18 (*)    All other components within normal limits  CBC WITH DIFFERENTIAL/PLATELET    EKG None  Radiology No results found.  Procedures .Lumbar Puncture  Date/Time: 12/30/2022 12:20 PM  Performed by: Arletha Pili, DO Authorized by: Arletha Pili, DO   Consent:    Consent obtained:  Verbal   Consent given by:  Patient   Risks, benefits, and alternatives were discussed: yes     Risks discussed:  Infection and headache   Alternatives discussed:  No treatment Universal protocol:    Patient identity confirmed:  Verbally with patient Pre-procedure details:    Procedure purpose:  Therapeutic Anesthesia:    Anesthesia method:  Local infiltration   Local anesthetic:  Lidocaine 1% w/o epi Procedure details:    Lumbar space:  L4-L5 interspace   Patient position:  Sitting   Needle gauge:  20   Needle type:  Spinal needle - Quincke tip   Needle  length (in):  5.0   Ultrasound guidance: no     Number of attempts:  1   Fluid appearance:  Clear   Tubes of fluid:  4   Total volume (ml):  30 Post-procedure details:    Puncture site:  Adhesive bandage applied   Procedure completion:  Tolerated well, no immediate complications     Medications Ordered in ED Medications  lidocaine (PF) (XYLOCAINE) 1 % injection (has no administration in time range)    ED Course/ Medical Decision Making/ A&P                                 Medical Decision Making 37 year old female here today with history of IIH.  Plan-will plan for lumbar puncture.  Reassessment-lumbar puncture completed without complications.  30 cc of clear fluid drained.  Patient will follow-up with neurology and PCP.  Amount and/or Complexity of Data Reviewed Labs: ordered.           Final Clinical Impression(s) / ED Diagnoses Final diagnoses:  IIH (idiopathic intracranial hypertension)    Rx / DC Orders ED Discharge Orders     None         Anders Simmonds T, DO 12/30/22 1222

## 2022-12-30 NOTE — Discharge Instructions (Addendum)
Drinking of fluids today, and lay down as needed.  Please follow-up with your neurologist and primary care doctor.

## 2022-12-30 NOTE — ED Triage Notes (Signed)
Pt states PCP spoke with a neurologist here and was told for pt to come to ED to get lumbar puncture to pull fluid off so she can go to neurology appt to discuss surgery. Pt states she has IIH. Pt states this is supposed to be a therapeutic pull of spinal fluid.

## 2022-12-31 ENCOUNTER — Other Ambulatory Visit: Payer: Self-pay | Admitting: Student

## 2022-12-31 DIAGNOSIS — F430 Acute stress reaction: Secondary | ICD-10-CM

## 2023-01-02 ENCOUNTER — Inpatient Hospital Stay (HOSPITAL_COMMUNITY)
Admission: EM | Admit: 2023-01-02 | Discharge: 2023-01-04 | DRG: 683 | Disposition: A | Discharge status: Home / Self Care | Payer: Medicaid Other | Attending: Internal Medicine | Admitting: Internal Medicine

## 2023-01-02 ENCOUNTER — Emergency Department (HOSPITAL_COMMUNITY): Payer: Medicaid Other

## 2023-01-02 ENCOUNTER — Encounter (HOSPITAL_COMMUNITY): Payer: Self-pay | Admitting: Family Medicine

## 2023-01-02 ENCOUNTER — Other Ambulatory Visit: Payer: Self-pay

## 2023-01-02 DIAGNOSIS — F32A Depression, unspecified: Secondary | ICD-10-CM | POA: Diagnosis present

## 2023-01-02 DIAGNOSIS — Z91013 Allergy to seafood: Secondary | ICD-10-CM

## 2023-01-02 DIAGNOSIS — I10 Essential (primary) hypertension: Secondary | ICD-10-CM | POA: Diagnosis present

## 2023-01-02 DIAGNOSIS — Z885 Allergy status to narcotic agent status: Secondary | ICD-10-CM

## 2023-01-02 DIAGNOSIS — N179 Acute kidney failure, unspecified: Principal | ICD-10-CM | POA: Diagnosis present

## 2023-01-02 DIAGNOSIS — Z6841 Body Mass Index (BMI) 40.0 and over, adult: Secondary | ICD-10-CM

## 2023-01-02 DIAGNOSIS — G932 Benign intracranial hypertension: Secondary | ICD-10-CM | POA: Diagnosis present

## 2023-01-02 DIAGNOSIS — R531 Weakness: Principal | ICD-10-CM

## 2023-01-02 DIAGNOSIS — Z91040 Latex allergy status: Secondary | ICD-10-CM

## 2023-01-02 DIAGNOSIS — Z818 Family history of other mental and behavioral disorders: Secondary | ICD-10-CM

## 2023-01-02 DIAGNOSIS — F419 Anxiety disorder, unspecified: Secondary | ICD-10-CM | POA: Diagnosis present

## 2023-01-02 DIAGNOSIS — Z8249 Family history of ischemic heart disease and other diseases of the circulatory system: Secondary | ICD-10-CM | POA: Diagnosis not present

## 2023-01-02 DIAGNOSIS — F17210 Nicotine dependence, cigarettes, uncomplicated: Secondary | ICD-10-CM | POA: Diagnosis present

## 2023-01-02 DIAGNOSIS — E876 Hypokalemia: Secondary | ICD-10-CM | POA: Diagnosis present

## 2023-01-02 DIAGNOSIS — I959 Hypotension, unspecified: Secondary | ICD-10-CM | POA: Diagnosis present

## 2023-01-02 DIAGNOSIS — Z79899 Other long term (current) drug therapy: Secondary | ICD-10-CM | POA: Diagnosis not present

## 2023-01-02 LAB — CBC WITH DIFFERENTIAL/PLATELET
Abs Immature Granulocytes: 0.02 10*3/uL (ref 0.00–0.07)
Basophils Absolute: 0 10*3/uL (ref 0.0–0.1)
Basophils Relative: 0 %
Eosinophils Absolute: 0 10*3/uL (ref 0.0–0.5)
Eosinophils Relative: 0 %
HCT: 40 % (ref 36.0–46.0)
Hemoglobin: 13 g/dL (ref 12.0–15.0)
Immature Granulocytes: 0 %
Lymphocytes Relative: 18 %
Lymphs Abs: 1.7 10*3/uL (ref 0.7–4.0)
MCH: 28.2 pg (ref 26.0–34.0)
MCHC: 32.5 g/dL (ref 30.0–36.0)
MCV: 86.8 fL (ref 80.0–100.0)
Monocytes Absolute: 0.5 10*3/uL (ref 0.1–1.0)
Monocytes Relative: 5 %
Neutro Abs: 7.4 10*3/uL (ref 1.7–7.7)
Neutrophils Relative %: 77 %
Platelets: 195 10*3/uL (ref 150–400)
RBC: 4.61 MIL/uL (ref 3.87–5.11)
RDW: 14.6 % (ref 11.5–15.5)
WBC: 9.7 10*3/uL (ref 4.0–10.5)
nRBC: 0 % (ref 0.0–0.2)

## 2023-01-02 LAB — COMPREHENSIVE METABOLIC PANEL
ALT: 43 U/L (ref 0–44)
AST: 20 U/L (ref 15–41)
Albumin: 3.9 g/dL (ref 3.5–5.0)
Alkaline Phosphatase: 94 U/L (ref 38–126)
Anion gap: 10 (ref 5–15)
BUN: 22 mg/dL — ABNORMAL HIGH (ref 6–20)
CO2: 20 mmol/L — ABNORMAL LOW (ref 22–32)
Calcium: 9 mg/dL (ref 8.9–10.3)
Chloride: 105 mmol/L (ref 98–111)
Creatinine, Ser: 2.34 mg/dL — ABNORMAL HIGH (ref 0.44–1.00)
GFR, Estimated: 27 mL/min — ABNORMAL LOW (ref >60)
Glucose, Bld: 111 mg/dL — ABNORMAL HIGH (ref 70–99)
Potassium: 3.6 mmol/L (ref 3.5–5.1)
Sodium: 135 mmol/L (ref 135–145)
Total Bilirubin: 0.4 mg/dL (ref 0.3–1.2)
Total Protein: 8 g/dL (ref 6.5–8.1)

## 2023-01-02 MED ORDER — SODIUM CHLORIDE 0.9 % IV BOLUS
500.0000 mL | Freq: Once | INTRAVENOUS | Status: AC
  Administered 2023-01-02: 500 mL via INTRAVENOUS

## 2023-01-02 MED ORDER — SODIUM CHLORIDE 0.9 % IV BOLUS
500.0000 mL | Freq: Once | INTRAVENOUS | Status: DC
Start: 2023-01-02 — End: 2023-01-02

## 2023-01-02 MED ORDER — TOPIRAMATE 100 MG PO TABS
100.0000 mg | ORAL_TABLET | Freq: Two times a day (BID) | ORAL | Status: DC
  Administered 2023-01-02 – 2023-01-04 (×4): 100 mg via ORAL
  Filled 2023-01-02 (×4): qty 1

## 2023-01-02 MED ORDER — ACETAMINOPHEN 325 MG PO TABS
650.0000 mg | ORAL_TABLET | Freq: Four times a day (QID) | ORAL | Status: DC | PRN
  Administered 2023-01-02 – 2023-01-03 (×2): 650 mg via ORAL
  Filled 2023-01-02 (×2): qty 2

## 2023-01-02 MED ORDER — ACETAMINOPHEN 650 MG RE SUPP: 650.0000 mg | Freq: Four times a day (QID) | RECTAL | Status: DC | PRN

## 2023-01-02 MED ORDER — ONDANSETRON HCL 4 MG PO TABS: 4.0000 mg | ORAL_TABLET | Freq: Four times a day (QID) | ORAL | Status: DC | PRN

## 2023-01-02 MED ORDER — FENTANYL CITRATE PF 50 MCG/ML IJ SOSY
12.5000 ug | PREFILLED_SYRINGE | INTRAMUSCULAR | Status: DC | PRN
  Administered 2023-01-02: 50 ug via INTRAVENOUS
  Administered 2023-01-02: 12.5 ug via INTRAVENOUS
  Administered 2023-01-03: 50 ug via INTRAVENOUS
  Filled 2023-01-02 (×3): qty 1

## 2023-01-02 MED ORDER — HEPARIN SODIUM (PORCINE) 5000 UNIT/ML IJ SOLN
5000.0000 [IU] | Freq: Three times a day (TID) | INTRAMUSCULAR | Status: DC
  Administered 2023-01-02 – 2023-01-04 (×5): 5000 [IU] via SUBCUTANEOUS
  Filled 2023-01-02 (×5): qty 1

## 2023-01-02 MED ORDER — LACTATED RINGERS IV SOLN: INTRAVENOUS | Status: DC

## 2023-01-02 MED ORDER — SODIUM CHLORIDE 0.9 % IV BOLUS
1000.0000 mL | Freq: Once | INTRAVENOUS | Status: AC
  Administered 2023-01-02: 1000 mL via INTRAVENOUS

## 2023-01-02 MED ORDER — ONDANSETRON HCL 4 MG/2ML IJ SOLN
4.0000 mg | Freq: Four times a day (QID) | INTRAMUSCULAR | Status: DC | PRN
  Administered 2023-01-02 – 2023-01-04 (×3): 4 mg via INTRAVENOUS
  Filled 2023-01-02 (×3): qty 2

## 2023-01-02 MED ORDER — ACETAZOLAMIDE 250 MG PO TABS
500.0000 mg | ORAL_TABLET | Freq: Two times a day (BID) | ORAL | Status: DC
  Administered 2023-01-02 – 2023-01-03 (×2): 500 mg via ORAL
  Filled 2023-01-02 (×2): qty 2

## 2023-01-02 MED ORDER — LORAZEPAM 1 MG PO TABS
1.0000 mg | ORAL_TABLET | Freq: Two times a day (BID) | ORAL | Status: DC | PRN
  Administered 2023-01-03 – 2023-01-04 (×2): 1 mg via ORAL
  Filled 2023-01-02 (×2): qty 1

## 2023-01-02 MED ORDER — LACTATED RINGERS IV BOLUS
1000.0000 mL | Freq: Once | INTRAVENOUS | Status: AC
  Administered 2023-01-02: 1000 mL via INTRAVENOUS

## 2023-01-02 MED ORDER — GADOBUTROL 1 MMOL/ML IV SOLN
10.0000 mL | Freq: Once | INTRAVENOUS | Status: AC | PRN
  Administered 2023-01-02: 10 mL via INTRAVENOUS

## 2023-01-02 MED ORDER — FLUOXETINE HCL 20 MG PO CAPS
20.0000 mg | ORAL_CAPSULE | Freq: Every day | ORAL | Status: DC
  Administered 2023-01-03 – 2023-01-04 (×2): 20 mg via ORAL
  Filled 2023-01-02 (×2): qty 1

## 2023-01-02 NOTE — ED Triage Notes (Signed)
EMS reports pt from Work. Pt bring in with complications of the two LP performed last week; most recent being last Saturday. Pt c/o of SOB, weakness, numbness and tingling, headaches, hip pain, low back pain around puncture site, n/v. Pt vomited this morning.  Pt states baseline Heart rate is around 39 to 49  EMS BP 130/90 HR 70

## 2023-01-02 NOTE — ED Notes (Signed)
ED TO INPATIENT HANDOFF REPORT  ED Nurse Name and Phone #: Suann Larry Name/Age/Gender Tabitha Holland 37 y.o. female Room/Bed: WA18/WA18  Code Status   Code Status: Full Code  Home/SNF/Other Home Patient oriented to: AAOx4 Is this baseline? Yes   Triage Complete: Triage complete  Chief Complaint AKI (acute kidney injury) (HCC) [N17.9]  Triage Note EMS reports pt from Work. Pt bring in with complications of the two LP performed last week; most recent being last Saturday. Pt c/o of SOB, weakness, numbness and tingling, headaches, hip pain, low back pain around puncture site, n/v. Pt vomited this morning.  Pt states baseline Heart rate is around 39 to 49  EMS BP 130/90 HR 70   Allergies Allergies  Allergen Reactions   Codeine Other (See Comments)    Migraines    Fish Allergy Itching   Hydrocodone Other (See Comments)    Migraines    Latex Itching   Percocet [Oxycodone-Acetaminophen] Other (See Comments)    Migraines    Shellfish Allergy Itching    Level of Care/Admitting Diagnosis ED Disposition     ED Disposition  Admit   Condition  --   Comment  Hospital Area: [facility] [[id number]]  Level of Care: Med-Surg [16]  May admit patient to [facility] or [facility] if equivalent level of care is available:: Yes  Covid Evaluation: Asymptomatic - no recent exposure (last 10 days) testing not required  Diagnosis: AKI (acute kidney injury) [facility]) [[id number]]  Admitting Physician: Briscoe Deutscher [[id number]]  Attending Physician: Briscoe Deutscher [[id number]]  Certification:: I certify this patient will need inpatient services for at least 2 midnights  Expected Medical Readiness: 01/04/2023          B Medical/Surgery History Past Medical History:  Diagnosis Date   Anxiety    Depression    Gallstones    History of anemia    per pt on 02/02/2021   History of suicidal ideation 10/02/2019   Hx of wisdom tooth extraction    Hypertension    IIH  (idiopathic intracranial hypertension)    Migraines    occasional per pt 02/02/2021   Morbid obesity (HCC)    Past Surgical History:  Procedure Laterality Date   CESAREAN SECTION N/A 10/28/2013   Procedure: CESAREAN SECTION;  Surgeon: Allie Bossier, MD;  Location: [facility];  Service: Obstetrics;  Laterality: N/A;   CESAREAN SECTION N/A 09/15/2015   Procedure: CESAREAN SECTION;  Surgeon: Catalina Antigua, MD;  Location: [facility];  Service: Obstetrics;  Laterality: N/A;   CESAREAN SECTION N/A 05/26/2018   Procedure: CESAREAN SECTION;  Surgeon: Adam Phenix, MD;  Location: [facility];  Service: Obstetrics;  Laterality: N/A;   CHOLECYSTECTOMY     DILATATION & CURETTAGE/HYSTEROSCOPY WITH MYOSURE N/A 02/09/2021   Procedure: DILATATION & CURETTAGE/HYSTEROSCOPY WITH MYOSURE;  Surgeon: Reva Bores, MD;  Location: [facility];  Service: Gynecology;  Laterality: N/A;  fibroid resection   WISDOM TOOTH EXTRACTION       A IV Location/Drains/Wounds Patient Lines/Drains/Airways Status     Active Line/Drains/Airways     Name Placement date Placement time Site Days   Peripheral IV 01/02/23 20 G 1.25" Right Antecubital 01/02/23  1142  Antecubital  less than 1            Intake/Output Last 24 hours No intake or output data in the 24 hours ending 01/02/23 1707  Labs/Imaging Results for orders placed or performed during the  hospital encounter of 01/02/23 (from the past 48 hour(s))  Comprehensive metabolic panel     Status: Abnormal   Collection Time: 01/02/23 11:32 AM  Result Value Ref Range   Sodium 135 135 - 145 mmol/L   Potassium 3.6 3.5 - 5.1 mmol/L   Chloride 105 98 - 111 mmol/L   CO2 20 (L) 22 - 32 mmol/L   Glucose, Bld 111 (H) 70 - 99 mg/dL    Comment: Glucose reference range applies only to samples taken after fasting for at least 8 hours.   BUN 22 (H) 6 - 20 mg/dL   Creatinine, Ser 9.52 (H) 0.44 - 1.00 mg/dL   Calcium 9.0 8.9 - 84.1 mg/dL   Total  Protein 8.0 6.5 - 8.1 g/dL   Albumin 3.9 3.5 - 5.0 g/dL   AST 20 15 - 41 U/L   ALT 43 0 - 44 U/L   Alkaline Phosphatase 94 38 - 126 U/L   Total Bilirubin 0.4 0.3 - 1.2 mg/dL   GFR, Estimated 27 (L) >60 mL/min    Comment: (NOTE) Calculated using the CKD-EPI Creatinine Equation (2021)    Anion gap 10 5 - 15    Comment: Performed at [facility], [address] [place], [place] [address]  CBC with Differential     Status: None   Collection Time: 01/02/23 11:32 AM  Result Value Ref Range   WBC 9.7 4.0 - 10.5 K/uL   RBC 4.61 3.87 - 5.11 MIL/uL   Hemoglobin 13.0 12.0 - 15.0 g/dL   HCT 10.2 72.5 - 36.6 %   MCV 86.8 80.0 - 100.0 fL   MCH 28.2 26.0 - 34.0 pg   MCHC 32.5 30.0 - 36.0 g/dL   RDW 44.0 34.7 - 42.5 %   Platelets 195 150 - 400 K/uL   nRBC 0.0 0.0 - 0.2 %   Neutrophils Relative % 77 %   Neutro Abs 7.4 1.7 - 7.7 K/uL   Lymphocytes Relative 18 %   Lymphs Abs 1.7 0.7 - 4.0 K/uL   Monocytes Relative 5 %   Monocytes Absolute 0.5 0.1 - 1.0 K/uL   Eosinophils Relative 0 %   Eosinophils Absolute 0.0 0.0 - 0.5 K/uL   Basophils Relative 0 %   Basophils Absolute 0.0 0.0 - 0.1 K/uL   Immature Granulocytes 0 %   Abs Immature Granulocytes 0.02 0.00 - 0.07 K/uL    Comment: Performed at [facility], [address] [place], [place] [address]   MR Lumbar Spine W Wo Contrast  Result Date: 01/02/2023 CLINICAL DATA:  Low back pain, cauda equina syndrome suspected. EXAM: MRI LUMBAR SPINE WITHOUT AND WITH CONTRAST TECHNIQUE: Multiplanar and multiecho pulse sequences of the lumbar spine were obtained without and with intravenous contrast. CONTRAST:  10mL GADAVIST GADOBUTROL 1 MMOL/ML IV SOLN COMPARISON:  None Available. FINDINGS: Segmentation:  Standard. Alignment:  Physiologic. Vertebrae:  No fracture, evidence of discitis, or bone lesion. Conus medullaris and cauda equina: Conus extends to the L1 level. Conus and cauda equina appear normal. No abnormal  contrast enhancement identified. Paraspinal and other soft tissues: Negative. Disc levels: T12-L1: No spinal canal or neural foraminal stenosis. L1-2: No spinal canal or neural foraminal stenosis. L2-3: Tiny left foraminal/far lateral disc protrusion and mild facet degenerative changes. No significant spinal canal or neural foraminal stenosis. L3-4: Mild-to-moderate bilateral facet degenerative changes, left greater than right. No significant spinal canal or neural foraminal stenosis. L4-5: Minimal disc bulge. Moderate hypertrophic right facet degenerative  changes with mild periarticular soft tissue edema. Mild left facet degenerative changes. No significant spinal canal or neural foraminal stenosis. L5-S1: Mild facet degenerative changes. No spinal canal or neural foraminal stenosis. IMPRESSION: 1. Degenerative changes of the lumbar spine, more pronounced at the level of the facet joints, particularly at L4-5 where there is mild periarticular soft tissue edema on the right. 2. No high-grade spinal canal or neural foraminal stenosis at any level. Electronically Signed   By: Baldemar Lenis M.D.   On: 01/02/2023 13:47    Pending Labs Unresulted Labs (From admission, onward)     Start     Ordered   01/03/23 0500  HIV Antibody (routine testing w rflx)  (HIV Antibody (Routine testing w reflex) panel)  Tomorrow morning,   R        01/02/23 1544   01/03/23 0500  Basic metabolic panel  Daily,   R      01/02/23 1544   01/03/23 0500  CBC  Daily,   R      01/02/23 1544            Vitals/Pain Today's Vitals   01/02/23 1200 01/02/23 1444 01/02/23 1500 01/02/23 1614  BP: (!) 113/56  103/66   Pulse: (!) 57  (!) 43   Resp: 18  18   Temp:  98.1 F (36.7 C)    TempSrc:  Oral    SpO2: 100%  100%   PainSc:    10-Worst pain ever    Isolation Precautions No active isolations  Medications Medications  acetaZOLAMIDE (DIAMOX) tablet 500 mg (has no administration in time range)  FLUoxetine  (PROZAC) tablet 20 mg (has no administration in time range)  LORazepam (ATIVAN) tablet 1 mg (has no administration in time range)  topiramate (TOPAMAX) tablet 100 mg (has no administration in time range)  heparin injection 5,000 Units (5,000 Units Subcutaneous Given 01/02/23 1623)  acetaminophen (TYLENOL) tablet 650 mg (has no administration in time range)    Or  acetaminophen (TYLENOL) suppository 650 mg (has no administration in time range)  fentaNYL (SUBLIMAZE) injection 12.5-50 mcg (50 mcg Intravenous Given 01/02/23 1619)  ondansetron (ZOFRAN) tablet 4 mg (has no administration in time range)    Or  ondansetron (ZOFRAN) injection 4 mg (has no administration in time range)  lactated ringers infusion (has no administration in time range)  lactated ringers bolus 1,000 mL (0 mLs Intravenous Stopped 01/02/23 1500)  gadobutrol (GADAVIST) 1 MMOL/ML injection 10 mL (10 mLs Intravenous Contrast Given 01/02/23 1235)  sodium chloride 0.9 % bolus 1,000 mL (0 mLs Intravenous Stopped 01/02/23 1541)    Mobility walks with person assist     Focused Assessments .   R Recommendations: See Admitting Provider Note  Report given to:   Additional Notes: .

## 2023-01-02 NOTE — ED Provider Notes (Signed)
[place] EMERGENCY DEPARTMENT AT [facility] Provider Note   CSN: [id number] Arrival date & time: 01/02/23  1013     History  Chief Complaint  Patient presents with   Nausea   Weakness   Shortness of Breath   Back Pain   Hip Pain   Headache    JOHNNI LUCATERO is a 37 y.o. female.  HPI Patient with multiple medical issues including obesity, idiopathic intracranial hypertension presents with weakness, fatigue, numbness, and low back pain.  Patient has been seen 3 prior times in the ED over the past 10 days.  She has had 2 lumbar punctures.  She has been compliant with her medications including Diamox and Lasix.  No focal weakness, no new vision changes, no vomiting, though she does have nausea anorexia and weakness as above.    Home Medications Prior to Admission medications   Medication Sig Start Date End Date Taking? Authorizing Provider  acetaminophen (TYLENOL) 325 MG tablet Take 650 mg by mouth once as needed for mild pain or moderate pain.   Yes [provider]  acetaZOLAMIDE (DIAMOX) 250 MG tablet Take 2 tablets (500 mg total) by mouth 2 (two) times daily. 05/17/22 01/02/23 Yes Camara, Amalia Hailey, MD  FLUoxetine (PROZAC) 20 MG tablet Take 1 tablet (20 mg total) by mouth daily. 08/04/22  Yes [name], MD  furosemide (LASIX) 20 MG tablet Take 1 tablet (20 mg total) by mouth daily. 12/25/22  Yes Bethann Berkshire, MD  LORazepam (ATIVAN) 1 MG tablet TAKE 1 TABLET(1 MG) BY MOUTH TWICE DAILY AS NEEDED FOR ANXIETY OR PANIC Patient taking differently: Take 1 mg by mouth 2 (two) times daily. 01/02/23  Yes [name], MD  Olmesartan-amLODIPine-HCTZ 40-5-25 MG TABS TAKE 1 TABLET BY MOUTH DAILY 06/23/22  Yes [name], MD  Ruxolitinib Phosphate (OPZELURA) 1.5 % CREA Apply 1 Application topically daily as needed (eczema).   Yes [provider]  spironolactone (ALDACTONE) 50 MG tablet TAKE 1 TABLET(50 MG) BY MOUTH AT BEDTIME 08/30/22  Yes [name], MD  topiramate (TOPAMAX) 100 MG tablet Take 1 tablet (100 mg total) by mouth 2 (two) times daily. 10/12/22 05/10/23 Yes [name], MD      Allergies    Codeine, Fish allergy, Hydrocodone, Latex, Percocet [oxycodone-acetaminophen], and Shellfish allergy    Review of Systems   Review of Systems  All other systems reviewed and are negative.   Physical Exam Updated Vital Signs BP (!) 113/56   Pulse (!) 57   Temp 98.1 F (36.7 C) (Oral)   Resp 18   LMP 11/23/2022   SpO2 100%  Physical Exam Vitals and nursing note reviewed.  Constitutional:      General: She is not in acute distress.    Appearance: She is well-developed. She is obese.  HENT:     Head: Normocephalic and atraumatic.  Eyes:     Conjunctiva/sclera: Conjunctivae normal.  Cardiovascular:     Rate and Rhythm: Normal rate and regular rhythm.  Pulmonary:     Effort: Pulmonary effort is normal. No respiratory distress.     Breath sounds: Normal breath sounds. No stridor.  Abdominal:     General: There is no distension.  Skin:    General: Skin is warm and dry.     Comments: Minimal erythema in the lumbar spine, no purulence, bleeding, discharge  Neurological:     General: No focal deficit present.     Mental Status: She is alert and  oriented to person, place, and time.     Cranial Nerves: No cranial nerve deficit.  Psychiatric:        Mood and Affect: Mood normal.     ED Results / Procedures / Treatments   Labs (all labs ordered are listed, but only abnormal results are displayed) Labs Reviewed  COMPREHENSIVE METABOLIC PANEL - Abnormal; Notable for the following components:      Result Value   CO2 20 (*)    Glucose, Bld 111 (*)    BUN 22 (*)    Creatinine, Ser 2.34 (*)    GFR, Estimated 27 (*)    All other components within normal limits  CBC WITH DIFFERENTIAL/PLATELET    EKG EKG Interpretation Date/Time:  Tuesday January 02 2023 10:24:45 EDT Ventricular Rate:  57 PR Interval:  189 QRS  Duration:  108 QT Interval:  480 QTC Calculation: 468 R Axis:   63  Text Interpretation: Sinus arrhythmia Confirmed by Gerhard Munch [phone]) on 01/02/2023 10:39:17 AM  Radiology MR Lumbar Spine W Wo Contrast  Result Date: 01/02/2023 CLINICAL DATA:  Low back pain, cauda equina syndrome suspected. EXAM: MRI LUMBAR SPINE WITHOUT AND WITH CONTRAST TECHNIQUE: Multiplanar and multiecho pulse sequences of the lumbar spine were obtained without and with intravenous contrast. CONTRAST:  10mL GADAVIST GADOBUTROL 1 MMOL/ML IV SOLN COMPARISON:  None Available. FINDINGS: Segmentation:  Standard. Alignment:  Physiologic. Vertebrae:  No fracture, evidence of discitis, or bone lesion. Conus medullaris and cauda equina: Conus extends to the L1 level. Conus and cauda equina appear normal. No abnormal contrast enhancement identified. Paraspinal and other soft tissues: Negative. Disc levels: T12-L1: No spinal canal or neural foraminal stenosis. L1-2: No spinal canal or neural foraminal stenosis. L2-3: Tiny left foraminal/far lateral disc protrusion and mild facet degenerative changes. No significant spinal canal or neural foraminal stenosis. L3-4: Mild-to-moderate bilateral facet degenerative changes, left greater than right. No significant spinal canal or neural foraminal stenosis. L4-5: Minimal disc bulge. Moderate hypertrophic right facet degenerative changes with mild periarticular soft tissue edema. Mild left facet degenerative changes. No significant spinal canal or neural foraminal stenosis. L5-S1: Mild facet degenerative changes. No spinal canal or neural foraminal stenosis. IMPRESSION: 1. Degenerative changes of the lumbar spine, more pronounced at the level of the facet joints, particularly at L4-5 where there is mild periarticular soft tissue edema on the right. 2. No high-grade spinal canal or neural foraminal stenosis at any level. Electronically Signed   By: Baldemar Lenis M.D.   On: 01/02/2023  13:47    Procedures Procedures    Medications Ordered in ED Medications  lactated ringers bolus 1,000 mL (0 mLs Intravenous Stopped 01/02/23 1500)  gadobutrol (GADAVIST) 1 MMOL/ML injection 10 mL (10 mLs Intravenous Contrast Given 01/02/23 1235)  sodium chloride 0.9 % bolus 1,000 mL (1,000 mLs Intravenous New Bag/Given 01/02/23 1500)    ED Course/ Medical Decision Making/ A&P                                 Medical Decision Making Adult female with obesity, hypertension, idiopathic intracranial hypertension presents with weakness, fatigue, context of recent LP.  Concern for infection versus cauda equina, electrolytes versus progression of disease.  Chart review notable for MRI MRV performed within the last week, LP x 2 performed within the past 10 days.  Patient placed on monitors, received fluids, pressure low initially, 90/50 and concern for dehydration as well. Cardiac  60 sinus normal Pulse ox 100% room air normal   Amount and/or Complexity of Data Reviewed External Data Reviewed: notes. Labs: ordered. Decision-making details documented in ED Course. Radiology: ordered and independent interpretation performed. Decision-making details documented in ED Course.  Risk Prescription drug management. Decision regarding hospitalization.   3:26 PM On repeat exam patient is somewhat better, blood pressure has been improved with fluids. Labs reviewed, discussed, considered, discussed with neurology.  Per neurology patient appropriate for admission to this facility, with ongoing fluid resuscitation given evidence for acute kidney injury.  Given the recent MRI of the lumbar spine today, had last week, no current indication for additional advanced imaging.  Patient should continue Diamox, hold Lasix, with anticipated improvement in her condition.  No other evidence for concurrent new pathology including infection, bacteremia, sepsis.         Final Clinical Impression(s) / ED  Diagnoses Final diagnoses:  Weakness  AKI (acute kidney injury) (HCC)    CRITICAL CARE Performed by: Gerhard Munch Total critical care time: 35 minutes Critical care time was exclusive of separately billable procedures and treating other patients. Critical care was necessary to treat or prevent imminent or life-threatening deterioration. Critical care was time spent personally by me on the following activities: development of treatment plan with patient and/or surrogate as well as nursing, discussions with consultants, evaluation of patient's response to treatment, examination of patient, obtaining history from patient or surrogate, ordering and performing treatments and interventions, ordering and review of laboratory studies, ordering and review of radiographic studies, pulse oximetry and re-evaluation of patient's condition.    Gerhard Munch, MD 01/02/23 1535

## 2023-01-02 NOTE — H&P (Signed)
History and Physical    Tabitha Holland:[id number] DOB: 1985-06-05 DOA: 01/02/2023  PCP: [name], MD   Patient coming from: Home   Chief Complaint: Low back pain, loss of appetite, headache   HPI: Tabitha Holland is a 37 y.o. female with medical history significant for hypertension, chronic headaches, idiopathic intracranial hypertension, depression, anxiety, and BMI 50 who presents with low back pain, headache, and loss of appetite.  She had recently been experiencing symptoms concerning for IIH and underwent therapeutic LP x 2, most recently on 12/30/2022.  She was also started on Lasix in the past week.  Patient has had low back pain since the most recent LP, has been experiencing nausea, and vomited this morning.  She denies abdominal pain, diarrhea, fever, or chills.  ED Course: Upon arrival to the ED, patient is found to be afebrile and saturating well on room air with low heart rate and systolic blood pressure as low as 85.  Labs are most notable for creatinine 2.34.  MRI of the lumbar spine is notable for degenerative changes without high-grade stenosis.  Neurology was contacted by the ED physician and advised that the patient could be admitted to [facility], Diamox should be continued, diuretics can be held, and IV fluids continued.  She was given 2 L of IV fluid in the ED.  Review of Systems:  All other systems reviewed and apart from HPI, are negative.  Past Medical History:  Diagnosis Date   Anxiety    Depression    Gallstones    History of anemia    per pt on 02/02/2021   History of suicidal ideation 10/02/2019   Hx of wisdom tooth extraction    Hypertension    IIH (idiopathic intracranial hypertension)    Migraines    occasional per pt 02/02/2021   Morbid obesity (HCC)     Past Surgical History:  Procedure Laterality Date   CESAREAN SECTION N/A 10/28/2013   Procedure: CESAREAN SECTION;  Surgeon: Allie Bossier, MD;  Location: [facility];   Service: Obstetrics;  Laterality: N/A;   CESAREAN SECTION N/A 09/15/2015   Procedure: CESAREAN SECTION;  Surgeon: Catalina Antigua, MD;  Location: [facility];  Service: Obstetrics;  Laterality: N/A;   CESAREAN SECTION N/A 05/26/2018   Procedure: CESAREAN SECTION;  Surgeon: Adam Phenix, MD;  Location: [facility]TH[facility] SU[facility];  Service: Obstetrics;  Laterality: N/A;   CHOLECYSTECTOMY     DILATATION & CURETTAGE/HYSTEROSCOPY WITH MYOSURE N/A 02/09/2021   Procedure: DILATATION & CURETTAGE/HYSTEROSCOPY WITH MYOSURE;  Surgeon: Reva Bores, MD;  Location: [facility];  Service: Gynecology;  Laterality: N/A;  fibroid resection   WISDOM TOOTH EXTRACTION      Social History:   reports that she has been smoking cigarettes. She has never used smokeless tobacco. She reports that she does not currently use alcohol. She reports that she does not currently use drugs after having used the following drugs: Marijuana.  Allergies  Allergen Reactions   Codeine Other (See Comments)    Migraines    Fish Allergy Itching   Hydrocodone Other (See Comments)    Migraines    Latex Itching   Percocet [Oxycodone-Acetaminophen] Other (See Comments)    Migraines    Shellfish Allergy Itching    Family History  Adopted: Yes  Problem Relation Age of Onset   Hypertension Sister    Bipolar disorder Brother    Schizophrenia Brother      Prior to Admission medications  Medication Sig Start Date End Date Taking? Authorizing Provider  acetaminophen (TYLENOL) 325 MG tablet Take 650 mg by mouth once as needed for mild pain or moderate pain.   Yes [provider]  acetaZOLAMIDE (DIAMOX) 250 MG tablet Take 2 tablets (500 mg total) by mouth 2 (two) times daily. 05/17/22 01/02/23 Yes Camara, Amalia Hailey, MD  FLUoxetine (PROZAC) 20 MG tablet Take 1 tablet (20 mg total) by mouth daily. 08/04/22  Yes [name], MD  furosemide (LASIX) 20 MG tablet Take 1 tablet (20 mg total) by mouth daily. 12/25/22   Yes Bethann Berkshire, MD  LORazepam (ATIVAN) 1 MG tablet TAKE 1 TABLET(1 MG) BY MOUTH TWICE DAILY AS NEEDED FOR ANXIETY OR PANIC Patient taking differently: Take 1 mg by mouth 2 (two) times daily. 01/02/23  Yes [name], MD  Olmesartan-amLODIPine-HCTZ 40-5-25 MG TABS TAKE 1 TABLET BY MOUTH DAILY 06/23/22  Yes [name], MD  Ruxolitinib Phosphate (OPZELURA) 1.5 % CREA Apply 1 Application topically daily as needed (eczema).   Yes [provider]  spironolactone (ALDACTONE) 50 MG tablet TAKE 1 TABLET(50 MG) BY MOUTH AT BEDTIME 08/30/22  Yes [name], MD  topiramate (TOPAMAX) 100 MG tablet Take 1 tablet (100 mg total) by mouth 2 (two) times daily. 10/12/22 05/10/23 Yes [name], MD    Physical Exam: Vitals:   01/02/23 1100 01/02/23 1200 01/02/23 1444 01/02/23 1500  BP: (!) 88/50 (!) 113/56  103/66  Pulse: (!) 55 (!) 57  (!) 43  Resp: (!) 22 18  18   Temp:   98.1 F (36.7 C)   TempSrc:   Oral   SpO2: 100% 100%  100%    Constitutional: NAD, calm  Eyes: PERTLA, lids and conjunctivae normal ENMT: Mucous membranes are moist. Posterior pharynx clear of any exudate or lesions.   Neck: supple, no masses  Respiratory:  no wheezing, no crackles. No accessory muscle use.  Cardiovascular: S1 & S2 heard, regular rate and rhythm. No extremity edema.   Abdomen: No distension, no tenderness, soft. Bowel sounds active.  Musculoskeletal: no clubbing / cyanosis. No joint deformity upper and lower extremities.   Skin: no significant rashes, lesions, ulcers. Warm, dry, well-perfused. Neurologic: CN 2-12 grossly intact. Sensation to light touch intact. Moving all extremities. Alert and oriented.  Psychiatric: Calm. Cooperative.    Labs and Imaging on Admission: I have personally reviewed following labs and imaging studies  CBC: Recent Labs  Lab 12/27/22 1234 12/30/22 1041 01/02/23 1132  WBC 8.8 9.0 9.7  NEUTROABS 5.4 5.6 7.4  HGB 13.5 13.3 13.0  HCT 42.0 40.9 40.0  MCV  88.6 86.7 86.8  PLT 239 226 195   Basic Metabolic Panel: Recent Labs  Lab 12/27/22 1234 12/30/22 1041 01/02/23 1132  NA 136 134* 135  K 3.6 3.5 3.6  CL 104 106 105  CO2 21* 18* 20*  GLUCOSE 79 88 111*  BUN 13 12 22*  CREATININE 0.93 0.87 2.34*  CALCIUM 9.9 9.1 9.0   GFR: Estimated Creatinine Clearance: 56.4 mL/min (A) (by C-G formula based on SCr of 2.34 mg/dL (H)). Liver Function Tests: Recent Labs  Lab 12/27/22 1234 01/02/23 1132  AST 14* 20  ALT 16 43  ALKPHOS 104 94  BILITOT 0.4 0.4  PROT 8.8* 8.0  ALBUMIN 4.4 3.9   No results for input(s): "LIPASE", "AMYLASE" in the last 168 hours. No results for input(s): "AMMONIA" in the last 168 hours. Coagulation Profile: No results for input(s): "INR", "PROTIME" in the  last 168 hours. Cardiac Enzymes: No results for input(s): "CKTOTAL", "CKMB", "CKMBINDEX", "TROPONINI" in the last 168 hours. BNP (last 3 results) No results for input(s): "PROBNP" in the last 8760 hours. HbA1C: No results for input(s): "HGBA1C" in the last 72 hours. CBG: No results for input(s): "GLUCAP" in the last 168 hours. Lipid Profile: No results for input(s): "CHOL", "HDL", "LDLCALC", "TRIG", "CHOLHDL", "LDLDIRECT" in the last 72 hours. Thyroid Function Tests: No results for input(s): "TSH", "T4TOTAL", "FREET4", "T3FREE", "THYROIDAB" in the last 72 hours. Anemia Panel: No results for input(s): "VITAMINB12", "FOLATE", "FERRITIN", "TIBC", "IRON", "RETICCTPCT" in the last 72 hours. Urine analysis:    Component Value Date/Time   COLORURINE YELLOW 05/15/2022 1755   APPEARANCEUR CLEAR 05/15/2022 1755   LABSPEC 1.025 05/15/2022 1755   PHURINE 6.5 05/15/2022 1755   GLUCOSEU NEGATIVE 05/15/2022 1755   HGBUR LARGE (A) 05/15/2022 1755   BILIRUBINUR NEGATIVE 05/15/2022 1755   KETONESUR 15 (A) 05/15/2022 1755   PROTEINUR TRACE (A) 05/15/2022 1755   UROBILINOGEN 0.2 11/07/2021 1408   NITRITE NEGATIVE 05/15/2022 1755   LEUKOCYTESUR NEGATIVE 05/15/2022  1755   Sepsis Labs: @LABRCNTIP (procalcitonin:4,lacticidven:4) )No results found for this or any previous visit (from the past 240 hour(s)).   Radiological Exams on Admission: MR Lumbar Spine W Wo Contrast  Result Date: 01/02/2023 CLINICAL DATA:  Low back pain, cauda equina syndrome suspected. EXAM: MRI LUMBAR SPINE WITHOUT AND WITH CONTRAST TECHNIQUE: Multiplanar and multiecho pulse sequences of the lumbar spine were obtained without and with intravenous contrast. CONTRAST:  10mL GADAVIST GADOBUTROL 1 MMOL/ML IV SOLN COMPARISON:  None Available. FINDINGS: Segmentation:  Standard. Alignment:  Physiologic. Vertebrae:  No fracture, evidence of discitis, or bone lesion. Conus medullaris and cauda equina: Conus extends to the L1 level. Conus and cauda equina appear normal. No abnormal contrast enhancement identified. Paraspinal and other soft tissues: Negative. Disc levels: T12-L1: No spinal canal or neural foraminal stenosis. L1-2: No spinal canal or neural foraminal stenosis. L2-3: Tiny left foraminal/far lateral disc protrusion and mild facet degenerative changes. No significant spinal canal or neural foraminal stenosis. L3-4: Mild-to-moderate bilateral facet degenerative changes, left greater than right. No significant spinal canal or neural foraminal stenosis. L4-5: Minimal disc bulge. Moderate hypertrophic right facet degenerative changes with mild periarticular soft tissue edema. Mild left facet degenerative changes. No significant spinal canal or neural foraminal stenosis. L5-S1: Mild facet degenerative changes. No spinal canal or neural foraminal stenosis. IMPRESSION: 1. Degenerative changes of the lumbar spine, more pronounced at the level of the facet joints, particularly at L4-5 where there is mild periarticular soft tissue edema on the right. 2. No high-grade spinal canal or neural foraminal stenosis at any level. Electronically Signed   By: Baldemar Lenis M.D.   On: 01/02/2023 13:47     EKG: Independently reviewed. Sinus arrhythmia.   Assessment/Plan   1. AKI  - Prerenal in setting of recent anorexia, increase in diuretics, and low BP  - Continue IVF hydration, hold olmesartan-HCTZ, Lasix, and Aldactone, renally-dose medications, repeat chem panel in am    2. IIH  - Continue Diamox    3. Hypertension  - Treat as-needed only for now   4. Depression, anxiety  - Continue Prozac and as-needed Ativan   5. Chronic headaches  - Continue Topamax    DVT prophylaxis: sq heparin  Code Status: Full  Level of Care: Level of care: Med-Surg Family Communication: None present   Disposition Plan:  Patient is from: Home  Anticipated d/c is to: Home  Anticipated d/c date is: 01/04/23  Patient currently: Pending improved/stable renal function  Consults called: None  Admission status: Inpatient     Briscoe Deutscher, MD [facility]  01/02/2023, 3:44 PM

## 2023-01-03 ENCOUNTER — Inpatient Hospital Stay (HOSPITAL_COMMUNITY): Payer: Medicaid Other

## 2023-01-03 DIAGNOSIS — N179 Acute kidney failure, unspecified: Secondary | ICD-10-CM

## 2023-01-03 LAB — CBC
HCT: 32.5 % — ABNORMAL LOW (ref 36.0–46.0)
Hemoglobin: 10.6 g/dL — ABNORMAL LOW (ref 12.0–15.0)
MCH: 28.6 pg (ref 26.0–34.0)
MCHC: 32.6 g/dL (ref 30.0–36.0)
MCV: 87.8 fL (ref 80.0–100.0)
Platelets: 141 10*3/uL — ABNORMAL LOW (ref 150–400)
RBC: 3.7 MIL/uL — ABNORMAL LOW (ref 3.87–5.11)
RDW: 14.5 % (ref 11.5–15.5)
WBC: 6.5 10*3/uL (ref 4.0–10.5)
nRBC: 0 % (ref 0.0–0.2)

## 2023-01-03 LAB — CSF CELL COUNT WITH DIFFERENTIAL
Color, CSF: 1 — AB
RBC Count, CSF: 1 /mm3 — ABNORMAL HIGH
RBC Count, CSF: 3 /mm3 — ABNORMAL HIGH
Tube #: 1
Tube #: 4
WBC, CSF: 1 /mm3 (ref 0–5)
WBC, CSF: 1 /mm3 (ref 0–5)

## 2023-01-03 LAB — URINALYSIS, ROUTINE W REFLEX MICROSCOPIC
Bilirubin Urine: NEGATIVE
Glucose, UA: NEGATIVE mg/dL
Hgb urine dipstick: NEGATIVE
Ketones, ur: NEGATIVE mg/dL
Leukocytes,Ua: NEGATIVE
Nitrite: NEGATIVE
Protein, ur: NEGATIVE mg/dL
Specific Gravity, Urine: 1.013 (ref 1.005–1.030)
pH: 5 (ref 5.0–8.0)

## 2023-01-03 LAB — BASIC METABOLIC PANEL
Anion gap: 7 (ref 5–15)
Anion gap: 6 (ref 5–15)
BUN: 22 mg/dL — ABNORMAL HIGH (ref 6–20)
BUN: 17 mg/dL (ref 6–20)
CO2: 20 mmol/L — ABNORMAL LOW (ref 22–32)
CO2: 21 mmol/L — ABNORMAL LOW (ref 22–32)
Calcium: 8.2 mg/dL — ABNORMAL LOW (ref 8.9–10.3)
Calcium: 8.6 mg/dL — ABNORMAL LOW (ref 8.9–10.3)
Chloride: 109 mmol/L (ref 98–111)
Chloride: 109 mmol/L (ref 98–111)
Creatinine, Ser: 1.23 mg/dL — ABNORMAL HIGH (ref 0.44–1.00)
Creatinine, Ser: 0.95 mg/dL (ref 0.44–1.00)
GFR, Estimated: 58 mL/min — ABNORMAL LOW (ref >60)
GFR, Estimated: >60 mL/min (ref >60)
Glucose, Bld: 107 mg/dL — ABNORMAL HIGH (ref 70–99)
Glucose, Bld: 93 mg/dL (ref 70–99)
Potassium: 3.4 mmol/L — ABNORMAL LOW (ref 3.5–5.1)
Potassium: 3.2 mmol/L — ABNORMAL LOW (ref 3.5–5.1)
Sodium: 136 mmol/L (ref 135–145)
Sodium: 136 mmol/L (ref 135–145)

## 2023-01-03 LAB — PROTEIN AND GLUCOSE, CSF
Glucose, CSF: 58 mg/dL (ref 40–70)
Total  Protein, CSF: 62 mg/dL — ABNORMAL HIGH (ref 15–45)

## 2023-01-03 LAB — TROPONIN I (HIGH SENSITIVITY): Troponin I (High Sensitivity): 10 ng/L (ref <18)

## 2023-01-03 LAB — MAGNESIUM: Magnesium: 2 mg/dL (ref 1.7–2.4)

## 2023-01-03 LAB — HIV ANTIBODY (ROUTINE TESTING W REFLEX): HIV Screen 4th Generation wRfx: NONREACTIVE

## 2023-01-03 MED ORDER — ACETAZOLAMIDE 250 MG PO TABS
250.0000 mg | ORAL_TABLET | Freq: Two times a day (BID) | ORAL | Status: DC
  Administered 2023-01-03 – 2023-01-04 (×2): 250 mg via ORAL
  Filled 2023-01-03 (×2): qty 1

## 2023-01-03 MED ORDER — POTASSIUM CHLORIDE CRYS ER 20 MEQ PO TBCR
40.0000 meq | EXTENDED_RELEASE_TABLET | Freq: Once | ORAL | Status: AC
  Administered 2023-01-03: 40 meq via ORAL
  Filled 2023-01-03: qty 2

## 2023-01-03 MED ORDER — SODIUM CHLORIDE 0.9 % IV BOLUS
500.0000 mL | Freq: Once | INTRAVENOUS | Status: AC
  Administered 2023-01-03: 500 mL via INTRAVENOUS

## 2023-01-03 MED ORDER — KETOROLAC TROMETHAMINE 30 MG/ML IJ SOLN
15.0000 mg | Freq: Once | INTRAMUSCULAR | Status: AC
  Administered 2023-01-03: 15 mg via INTRAVENOUS
  Filled 2023-01-03: qty 1

## 2023-01-03 MED ORDER — LACTATED RINGERS IV BOLUS
1000.0000 mL | Freq: Once | INTRAVENOUS | Status: AC
  Administered 2023-01-03: 1000 mL via INTRAVENOUS

## 2023-01-03 MED ORDER — CAFFEINE 200 MG PO TABS
200.0000 mg | ORAL_TABLET | ORAL | Status: DC | PRN
  Administered 2023-01-03 – 2023-01-04 (×2): 200 mg via ORAL
  Filled 2023-01-03 (×4): qty 1

## 2023-01-03 MED ORDER — POTASSIUM CHLORIDE CRYS ER 20 MEQ PO TBCR
40.0000 meq | EXTENDED_RELEASE_TABLET | Freq: Once | ORAL | Status: AC
  Administered 2023-01-04: 40 meq via ORAL
  Filled 2023-01-03: qty 2

## 2023-01-03 MED ORDER — METHOCARBAMOL 500 MG PO TABS
500.0000 mg | ORAL_TABLET | Freq: Four times a day (QID) | ORAL | Status: DC | PRN
  Administered 2023-01-03: 500 mg via ORAL
  Filled 2023-01-03: qty 1

## 2023-01-03 MED ORDER — LIDOCAINE HCL (PF) 1 % IJ SOLN
INTRAMUSCULAR | Status: AC
  Filled 2023-01-03: qty 5

## 2023-01-03 NOTE — Progress Notes (Signed)
[facility] Progress Note Patient: Tabitha Holland ZOX:[id number] DOB: 1985/12/26 DOA: 01/02/2023  DOS: the patient was seen and examined on 01/03/2023  Brief hospital course: PMH of intracranial hypertension, HTN, migraine, depression, anxiety, class III obesity present to the hospital with complaints of back pain headache and numbness. Patient has history of IIH and has underwent therapeutic LP on 8/26 and 8/31.  (No opening pressure recorded) Presents with worsening headache, phonophobia, photophobia as well as numbness of face.  Also had fatigue and was found to have AKI.  (Was recently asked to start taking Lasix and increase the dose of her Diamox.) Discussed with neurology Dr. Jerrell Belfast on 9/4.  Recommended LP to rule out meningitis as well as treat IIH if the opening pressure is elevated.  Assessment and Plan: AKI. Hypokalemia. Baseline serum creatinine 0.8.  On admission serum creatinine 2.34.  Improving with IV fluid to 1.2. Potassium 3.4. Currently being replaced. Will continue with IV hydration. Suspect AKI in the setting of Lasix and Diamox as well as hypotension in the setting of ongoing use of hypertensive medications. Hold antihypertensive regimen. Continue with aggressive IV hydration. Monitor.  Idiopathic intracranial hypertension. Chronic migraine. Facial numbness. MRI brain negative for any acute abnormality. Underwent LP again opening pressure was 21.  CSF was removed. Discussed with neurology who also recommended to rule out meningitis therefore CSF was sent for further workup. Thankfully no evidence of pleocytosis.  Will monitor. Continue pain medication for headache. Continue Topamax.  Continue Diamox. Patient has been informed that she will not be getting any surgical intervention during this hospital stay and will have to continue follow-up with outpatient neurologist and ophthalmologist for further workup and treatment.  HTN. Blood pressure soft. Will  discontinue with hypertensive medication.  Depression and anxiety. Continue current regimen. May require further adjustment.  Class III obesity. Placing the patient at high risk over outcome. Body mass index is 52.03 kg/m.  Sleep study in November 2023 was negative for any significant sleep apnea.   Subjective: Ongoing headache.  No nausea no vomiting.  No fever no chills.  Ongoing numbness.  Ongoing ear fullness as well as blurry vision.  Physical Exam: General: in Mild distress, No Rash Cardiovascular: S1 and S2 Present, No Murmur Respiratory: Good respiratory effort, Bilateral Air entry present. No Crackles, No wheezes Abdomen: Bowel Sound present, No tenderness Extremities: No edema Neuro: Alert and oriented x3, no new focal deficit  Data Reviewed: I have Reviewed nursing notes, Vitals, and Lab results. Since last encounter, pertinent lab results CBC and BMP   . I have ordered test including CBC and BMP  . I have discussed pt's care plan and test results with neurology and IR  . I have ordered imaging MRI brain  .   Disposition: Status is: Inpatient Remains inpatient appropriate because: Improvement in headache  heparin injection 5,000 Units Start: 01/02/23 1545   Family Communication: Family at bedside Level of care: Med-Surg   Vitals:   01/03/23 0234 01/03/23 0500 01/03/23 0622 01/03/23 0954  BP: (!) 85/43  (!) 94/54 114/65  Pulse: (!) 48  (!) 48 (!) 49  Resp: 18  17 18   Temp:    (!) 97.5 F (36.4 C)  TempSrc:   Oral   SpO2: 100%  100% 100%  Weight:  (!) 169.2 kg    Height:         Author: Lynden Oxford, MD 01/03/2023 5:26 PM  Please look on [email or web address] to find out who is on call.

## 2023-01-03 NOTE — Plan of Care (Signed)
  Problem: Education: Goal: Knowledge of General Education information will improve Description: Including pain rating scale, medication(s)/side effects and non-pharmacologic comfort measures Outcome: Progressing   Problem: Health Behavior/Discharge Planning: Goal: Ability to manage health-related needs will improve Outcome: Progressing   Problem: Clinical Measurements: Goal: Ability to maintain clinical measurements within normal limits will improve Outcome: Progressing Goal: Will remain free from infection Outcome: Progressing Goal: Diagnostic test results will improve Outcome: Progressing Goal: Respiratory complications will improve Outcome: Progressing Goal: Cardiovascular complication will be avoided Outcome: Progressing   Pt A/Ox4 on RA. PRN nausea and pain medication administered. Bps low overnight x3 bolus NS administered. LR running at 1103ml/hr. Pt now states she's "having IIH symptoms again" c/o headache, numbness/tingling arms/face, ear pressure/ringing, NP on call notified, pt states she wants to talk to a neurologist today and won't be taking anymore medications until she does. Neurology consult in.

## 2023-01-03 NOTE — Progress Notes (Signed)
   01/03/23 1957  Vitals  Temp 98.7 F (37.1 C)  Temp Source Oral  BP (!) 109/56  MAP (mmHg) 71  BP Location Left Arm  BP Method Automatic  Patient Position (if appropriate) Lying  Pulse Rate (!) 44  Pulse Rate Source Monitor  Resp 14  MEWS COLOR  MEWS Score Color Green  Oxygen Therapy  SpO2 100 %  MEWS Score  MEWS Temp 0  MEWS Systolic 0  MEWS Pulse 1  MEWS RR 0  MEWS LOC 0  MEWS Score 1   Patient transferred to room 1502 for continuous cardiac monitoring. Patient was c/o chest pain denies dizziness or sob . [name], NP was notified and stat EKG was ordered pt sinus brady, and labs were also ordered. Patient has low bp and hr ,  provider notified . Patient is alert and oriented x 4,  remains lying flat in bed . Bedside report was given to Dwana Curd, Charity fundraiser.

## 2023-01-03 NOTE — Procedures (Signed)
Technically successful fluoroscopically-guided L4-L5 lumbar puncture.  Opening pressure: 21 cm water. 11 mL of CSF collected and sent for laboratory studies.

## 2023-01-03 NOTE — Progress Notes (Signed)
    Patient Name: JADON ALWARD           DOB: Jun 23, 1985  MRN: [id number]      Admission Date: 01/02/2023  Attending Provider: Rolly Salter, MD  Primary Diagnosis: AKI (acute kidney injury) [facility])   Level of care: Med-Surg    CROSS COVER NOTE   Date of Service   01/03/2023   [name], 37 y.o. female, was admitted on 01/02/2023 for AKI (acute kidney injury) (HCC).    HPI/Events of Note   Chest Pain PMH: Chronic chest pain  10/10 sharp left chest pain, non-radiating. Patient reports pain as chronic and "same as always."  She recently received fentanyl, which has helped improve chest pain to 7/10.   Pain is not reproducible on palpation.  Denies dyspnea, fatigue, palpitations, dizziness, abdominal pain, nausea, vomiting, radiation of pain.  At bedside patient looks relaxed and in no distress.  She states that at home, she usually rests and waits for intermittent pain to go away without intervention.   Interventions/ Plan   EKG-sinus bradycardia without ST segment changes Troponin x2 - negative IV Toradol, Robaxin Cardiac monitoring         Anthoney Harada, DNP, ACNPC- AG [facility]

## 2023-01-03 NOTE — Plan of Care (Signed)

## 2023-01-03 NOTE — Hospital Course (Signed)
Brief hospital course: PMH of intracranial hypertension, HTN, migraine, depression, anxiety, class III obesity present to the hospital with complaints of back pain headache and numbness. Patient has history of IIH and has underwent therapeutic LP on 8/26 and 8/31.  (No opening pressure recorded) Presents with worsening headache, phonophobia, photophobia as well as numbness of face.  Also had fatigue and was found to have AKI.  (Was recently asked to start taking Lasix and increase the dose of her Diamox.) Discussed with neurology Dr. Jerrell Belfast on 9/4.  Recommended LP to rule out meningitis as well as treat IIH if the opening pressure is elevated.  Assessment and Plan: AKI. Hypokalemia. Baseline serum creatinine 0.8.  On admission serum creatinine 2.34.  Improving with IV fluid to 1.2. Potassium 3.4. Currently being replaced. Will continue with IV hydration. Suspect AKI in the setting of Lasix and Diamox as well as hypotension in the setting of ongoing use of hypertensive medications. Hold antihypertensive regimen. Continue with aggressive IV hydration. Monitor.  Idiopathic intracranial hypertension. Chronic migraine. Facial numbness. MRI brain negative for any acute abnormality. Underwent LP again opening pressure was 21.  CSF was removed. Discussed with neurology who also recommended to rule out meningitis therefore CSF was sent for further workup. Thankfully no evidence of pleocytosis.  Will monitor. Continue pain medication for headache. Continue Topamax.  Continue Diamox. Patient has been informed that she will not be getting any surgical intervention during this hospital stay and will have to continue follow-up with outpatient neurologist and ophthalmologist for further workup and treatment.  HTN. Blood pressure soft. Will discontinue with hypertensive medication.  Depression and anxiety. Continue current regimen. May require further adjustment.  Class III obesity. Placing the  patient at high risk over outcome. Body mass index is 52.03 kg/m.  Sleep study in November 2023 was negative for any significant sleep apnea.

## 2023-01-04 ENCOUNTER — Ambulatory Visit (INDEPENDENT_AMBULATORY_CARE_PROVIDER_SITE_OTHER): Payer: Medicaid Other | Admitting: Student

## 2023-01-04 ENCOUNTER — Encounter: Payer: Self-pay | Admitting: Student

## 2023-01-04 DIAGNOSIS — N179 Acute kidney failure, unspecified: Secondary | ICD-10-CM | POA: Diagnosis not present

## 2023-01-04 DIAGNOSIS — G932 Benign intracranial hypertension: Secondary | ICD-10-CM

## 2023-01-04 LAB — BASIC METABOLIC PANEL
Anion gap: 8 (ref 5–15)
BUN: 18 mg/dL (ref 6–20)
CO2: 20 mmol/L — ABNORMAL LOW (ref 22–32)
Calcium: 8.8 mg/dL — ABNORMAL LOW (ref 8.9–10.3)
Chloride: 110 mmol/L (ref 98–111)
Creatinine, Ser: 0.9 mg/dL (ref 0.44–1.00)
GFR, Estimated: >60 mL/min (ref >60)
Glucose, Bld: 92 mg/dL (ref 70–99)
Potassium: 3.6 mmol/L (ref 3.5–5.1)
Sodium: 138 mmol/L (ref 135–145)

## 2023-01-04 LAB — CBC
HCT: 32 % — ABNORMAL LOW (ref 36.0–46.0)
Hemoglobin: 10.6 g/dL — ABNORMAL LOW (ref 12.0–15.0)
MCH: 28.9 pg (ref 26.0–34.0)
MCHC: 33.1 g/dL (ref 30.0–36.0)
MCV: 87.2 fL (ref 80.0–100.0)
Platelets: 147 10*3/uL — ABNORMAL LOW (ref 150–400)
RBC: 3.67 MIL/uL — ABNORMAL LOW (ref 3.87–5.11)
RDW: 14.2 % (ref 11.5–15.5)
WBC: 7 10*3/uL (ref 4.0–10.5)
nRBC: 0 % (ref 0.0–0.2)

## 2023-01-04 LAB — TROPONIN I (HIGH SENSITIVITY): Troponin I (High Sensitivity): 9 ng/L (ref <18)

## 2023-01-04 LAB — MAGNESIUM: Magnesium: 1.9 mg/dL (ref 1.7–2.4)

## 2023-01-04 MED ORDER — DOCUSATE SODIUM 100 MG PO CAPS: 100.0000 mg | ORAL_CAPSULE | Freq: Two times a day (BID) | ORAL | 0 refills | Status: DC

## 2023-01-04 MED ORDER — POLYETHYLENE GLYCOL 3350 17 G PO PACK: 17.0000 g | PACK | Freq: Every day | ORAL | 0 refills | Status: DC

## 2023-01-04 MED ORDER — CAFFEINE 200 MG PO TABS: 200.0000 mg | ORAL_TABLET | ORAL | 0 refills | Status: DC | PRN

## 2023-01-04 MED ORDER — SENNOSIDES-DOCUSATE SODIUM 8.6-50 MG PO TABS
1.0000 | ORAL_TABLET | Freq: Two times a day (BID) | ORAL | Status: DC
  Administered 2023-01-04: 1 via ORAL
  Filled 2023-01-04: qty 1

## 2023-01-04 MED ORDER — FLUOXETINE HCL 40 MG PO CAPS: 40.0000 mg | ORAL_CAPSULE | Freq: Every day | ORAL | 0 refills | Status: DC

## 2023-01-04 MED ORDER — METHOCARBAMOL 500 MG PO TABS: 500.0000 mg | ORAL_TABLET | Freq: Four times a day (QID) | ORAL | Status: DC | PRN

## 2023-01-04 MED ORDER — FLUOXETINE HCL 20 MG PO CAPS: 40.0000 mg | ORAL_CAPSULE | Freq: Every day | ORAL | Status: DC

## 2023-01-04 MED ORDER — METHOCARBAMOL 500 MG PO TABS: 500.0000 mg | ORAL_TABLET | Freq: Three times a day (TID) | ORAL | 0 refills | Status: DC | PRN

## 2023-01-04 MED ORDER — POLYETHYLENE GLYCOL 3350 17 G PO PACK
17.0000 g | PACK | Freq: Every day | ORAL | Status: DC
  Administered 2023-01-04: 17 g via ORAL
  Filled 2023-01-04: qty 1

## 2023-01-04 MED ORDER — ACETAZOLAMIDE 250 MG PO TABS: 500.0000 mg | ORAL_TABLET | Freq: Two times a day (BID) | ORAL | 0 refills | Status: DC

## 2023-01-04 NOTE — Plan of Care (Signed)
  Problem: Education: Goal: Knowledge of General Education information will improve Description: Including pain rating scale, medication(s)/side effects and non-pharmacologic comfort measures Outcome: Progressing   Problem: Health Behavior/Discharge Planning: Goal: Ability to manage health-related needs will improve Outcome: Progressing   Problem: Clinical Measurements: Goal: Ability to maintain clinical measurements within normal limits will improve Outcome: Progressing Goal: Cardiovascular complication will be avoided Outcome: Progressing   Problem: Activity: Goal: Risk for activity intolerance will decrease Outcome: Not Progressing   Problem: Clinical Measurements: Goal: Cardiovascular complication will be avoided Outcome: Progressing

## 2023-01-04 NOTE — Plan of Care (Signed)
  Problem: Education: Goal: Knowledge of General Education information will improve Description: Including pain rating scale, medication(s)/side effects and non-pharmacologic comfort measures Outcome: Progressing   Problem: Health Behavior/Discharge Planning: Goal: Ability to manage health-related needs will improve Outcome: Progressing   Problem: Clinical Measurements: Goal: Ability to maintain clinical measurements within normal limits will improve Outcome: Progressing Goal: Cardiovascular complication will be avoided Outcome: Progressing   Problem: Activity: Goal: Risk for activity intolerance will decrease Outcome: Not Progressing

## 2023-01-04 NOTE — TOC CM/SW Note (Addendum)
TOC Brief Assessment   Insurance and Status Insurance coverage has been reviewed  Patient has primary care physician Yes  Home environment has been reviewed Yes  Prior level of function: Indepedent  Prior/Current Home Services No current home services  Social Determinants of Health Reivew SDOH reviewed interventions complete  Readmission risk has been reviewed Yes  Transition of care needs no transition of care needs at this time

## 2023-01-04 NOTE — Progress Notes (Signed)
Patient was scheduled to see me in clinic but unable to 2/2 admission at [facility] for worsening neurologic symptoms 2/2 her IIH and AKI. AKI was thought to be 2/2 Lasix+Diamox+poor PO 2/2 feeling poorly with IIH as above. She had an LP on 9/4 and is feeling better at the time of our conversation.  Given she was unable to come into the office, we connected by phone.  She tells me she is tired of having so many LPs and is eager to get plugged in with neurosurgery to be evaluated for a VP shunt. She understands this is certainly not a risk-free procedure, but neither are serial LPs. She is uncertain whether the hospitalist team will be referring her to neurosurgery at time of discharge. I will follow this up for her and if the referral is not made by hospitalist team, I'd be happy to refer her to NS myself to get the ball rolling. Her follow-up with Dr. Teresa Coombs is not until 9/30.  Made an appointment to see me in clinic next Thursday for hospital follow-up. Have asked her wife to accompany as I think their emotional support of one another is key to their care.   Eliezer Mccoy, MD

## 2023-01-05 ENCOUNTER — Other Ambulatory Visit: Payer: Self-pay | Admitting: Student

## 2023-01-05 DIAGNOSIS — G932 Benign intracranial hypertension: Secondary | ICD-10-CM

## 2023-01-06 NOTE — Discharge Summary (Signed)
Physician Discharge Summary   Patient: Tabitha Holland MRN: [id number] DOB: [birth year]  Admit date:     01/02/2023  Discharge date: 01/04/2023  Discharge Physician: Lynden Oxford  PCP: [name], MD  Recommendations at discharge: Follow-up with PCP in 1 week. Follow-up with ophthalmology as recommended. Follow-up with neurology in 2 weeks.   Follow-up Information     [name], MD. Schedule an appointment as soon as possible for a visit in 1 week(s).   Specialty: Family Medicine Contact information: [address] [place] [address] [phone]         [facility] Schedule an appointment as soon as possible for a visit in 1 week(s).   Contact information: [address] [place] [address] [phone]                Discharge Diagnoses: Principal Problem:   AKI (acute kidney injury) (HCC) Active Problems:   Primary hypertension   IIH (idiopathic intracranial hypertension)   Depression   Anxiety  Brief hospital course: PMH of intracranial hypertension, HTN, migraine, depression, anxiety, class III obesity present to the hospital with complaints of back pain headache and numbness. Patient has history of IIH and has underwent therapeutic LP on 8/26 and 8/31.  (No opening pressure recorded) Presents with worsening headache, phonophobia, photophobia as well as numbness of face.  Also had fatigue and was found to have AKI.  (Was recently asked to start taking Lasix and increase the dose of her Diamox.) Discussed with neurology Dr. Wilford Corner on 9/4.  Recommended LP to rule out meningitis as well as treat IIH if the opening pressure is elevated. Underwent LP.  Showed elevated opening pressure.  Headache improved after removal of CSF.  Concern is that the patient will require repeat LP soon.  Assessment and Plan: AKI. Hypokalemia. Baseline serum creatinine 0.8.  On admission serum creatinine 2.34.  Improving with IV fluid to  1.2. Potassium 3.4. Suspect AKI in the setting of Lasix and Diamox as well as hypotension in the setting of ongoing use of hypertensive medications. Hold antihypertensive regimen. Hold Lasix as well. Resume Diamox at a lower dose for now.  Idiopathic intracranial hypertension. Chronic migraine. Facial numbness. MRI brain negative for any acute abnormality. Underwent LP again opening pressure was 21.  CSF was removed. Discussed with neurology who also recommended to rule out meningitis therefore CSF was sent for further workup. Thankfully no evidence of pleocytosis.  Cultures negative as well.  Will monitor. Continue pain medication for headache. Continue Topamax.  Continue Diamox. Patient has been informed that she will not be getting any surgical intervention during this hospital stay and will have to continue follow-up with outpatient neurologist and ophthalmologist for further workup and treatment.  HTN. Blood pressure soft. Will discontinue with hypertensive medication.  Depression and anxiety. Continue current regimen. May require further adjustment.  Class III obesity. Placing the patient at high risk over outcome. Body mass index is 52.03 kg/m.  Sleep study in November 2023 was negative for any significant sleep apnea.  Pain control - [employer] Controlled Substance Reporting System database was reviewed. and patient was instructed, not to drive, operate heavy machinery, perform activities at heights, swimming or participation in water activities or provide baby-sitting services while on Pain, Sleep and Anxiety Medications; until their outpatient Physician has advised to do so again. Also recommended to not to take more than prescribed Pain, Sleep and Anxiety Medications.  Consultants:  Radiology  Procedures performed:  LP  DISCHARGE MEDICATION: Allergies as of 01/04/2023       Reactions   Butalbital Itching   Codeine Other (See Comments)   Migraines    Fish  Allergy Itching   Hydrocodone Other (See Comments)   Migraines    Latex Itching   Percocet [oxycodone-acetaminophen] Other (See Comments)   Migraines    Shellfish Allergy Itching        Medication List     STOP taking these medications    FLUoxetine 20 MG tablet Commonly known as: PROZAC Replaced by: FLUoxetine 40 MG capsule   furosemide 20 MG tablet Commonly known as: LASIX   Olmesartan-amLODIPine-HCTZ 40-5-25 MG Tabs       TAKE these medications    acetaminophen 325 MG tablet Commonly known as: TYLENOL Take 650 mg by mouth once as needed for mild pain or moderate pain.   acetaZOLAMIDE 250 MG tablet Commonly known as: DIAMOX Take 2 tablets (500 mg total) by mouth 2 (two) times daily.   caffeine 200 MG Tabs tablet Take 1 tablet (200 mg total) by mouth every 4 (four) hours as needed.   docusate sodium 100 MG capsule Commonly known as: Colace Take 1 capsule (100 mg total) by mouth 2 (two) times daily.   FLUoxetine 40 MG capsule Commonly known as: PROZAC Take 1 capsule (40 mg total) by mouth daily. Replaces: FLUoxetine 20 MG tablet   LORazepam 1 MG tablet Commonly known as: ATIVAN TAKE 1 TABLET(1 MG) BY MOUTH TWICE DAILY AS NEEDED FOR ANXIETY OR PANIC   methocarbamol 500 MG tablet Commonly known as: ROBAXIN Take 1 tablet (500 mg total) by mouth every 8 (eight) hours as needed for muscle spasms.   Opzelura 1.5 % Crea Generic drug: Ruxolitinib Phosphate Apply 1 Application topically daily as needed (eczema).   polyethylene glycol 17 g packet Commonly known as: MIRALAX / GLYCOLAX Take 17 g by mouth daily.   spironolactone 50 MG tablet Commonly known as: ALDACTONE TAKE 1 TABLET(50 MG) BY MOUTH AT BEDTIME   topiramate 100 MG tablet Commonly known as: Topamax Take 1 tablet (100 mg total) by mouth 2 (two) times daily.       Disposition: Home Diet recommendation: Cardiac diet  Discharge Exam: Vitals:   01/03/23 1957 01/03/23 2021 01/03/23 2023  01/04/23 0539  BP: (!) 109/56 (!) 93/50 (!) 102/49 107/66  Pulse: (!) 44 (!) 49 (!) 45 (!) 50  Resp: 14 18  17   Temp: 98.7 F (37.1 C) 98.6 F (37 C)  98.5 F (36.9 C)  TempSrc: Oral Oral  Oral  SpO2: 100% 100%  100%  Weight:      Height:       General: Appear in mild distress; no visible Abnormal Neck Mass Or lumps, Conjunctiva normal Cardiovascular: S1 and S2 Present, no Murmur, Respiratory: good respiratory effort, Bilateral Air entry present and CTA, no Crackles, no wheezes Abdomen: Bowel Sound present, Non tender  Extremities: no Pedal edema Neurology: alert and oriented to time, place, and person  Filed Weights   01/03/23 0500  Weight: (!) 169.2 kg   Condition at discharge: stable  The results of significant diagnostics from this hospitalization (including imaging, microbiology, ancillary and laboratory) are listed below for reference.   Imaging Studies: DG FL GUIDED LUMBAR PUNCTURE  Result Date: 01/03/2023 CLINICAL DATA:  Provided history: Intracranial hypertension. Suspected infectious meningitis. EXAM: LUMBAR PUNCTURE UNDER FLUOROSCOPIC GUIDANCE FLUOROSCOPY: Radiation Exposure Index (as provided by the fluoroscopic device): 38.8 mGy Kerma PROCEDURE: Informed  consent was obtained from the patient prior to the procedure. This process included a discussion of procedure risks, benefits and alternatives. Potential risks of the procedure discussed included but were not limited to bleeding, infection, spinal cord/nerve root damage, headache and CSF leak. The patient was positioned prone on the fluoroscopy table. An appropriate skin entry site was determined under fluoroscopy and marked. A time-out was performed. The operator donned sterile gloves and a mask. The lower back was prepped and draped in the usual sterile fashion. Local anesthesia was provided with 1% lidocaine. Under intermittent fluoroscopy, lumbar puncture was performed at the L4-L5 level using a 6 inch 20 gauge spinal  needle with return of clear/colorless CSF and an opening pressure of 21 cm water. 11 mL of CSF were collected and sent for laboratory studies. The inner stylet was replaced within the needle and the needle was removed in its entirety. A dressing was applied at the skin entry site. The patient tolerated the procedure well and no immediate post-procedure complication was apparent. IMPRESSION: 1. Technically successful fluoroscopically-guided L4-L5 lumbar puncture. 2. Opening pressure: 21 cm water. 3. 11 mL of CSF collected and sent for laboratory studies. Electronically Signed   By: Jackey Loge D.O.   On: 01/03/2023 16:31   MR BRAIN WO CONTRAST  Result Date: 01/03/2023 CLINICAL DATA:  Neuro deficit, acute, stroke suspected. Headache, numbness/tingling in the arms and face, ear pressure, and tinnitus. History of idiopathic intracranial hypertension. EXAM: MRI HEAD WITHOUT CONTRAST TECHNIQUE: Multiplanar, multiecho pulse sequences of the brain and surrounding structures were obtained without intravenous contrast. COMPARISON:  Head MRI and MRV 12/27/2022 FINDINGS: Brain: There is no evidence of an acute infarct, intracranial hemorrhage, mass, midline shift, or extra-axial fluid collection. A partially empty sella and right greater than left Meckel's cave enlargement are again noted. The cerebellar tonsils are normally positioned. The brain is normal in signal. The ventricles are normal in size. Vascular: Major intracranial vascular flow voids are preserved. Skull and upper cervical spine: Unremarkable bone marrow signal. Sinuses/Orbits: Unremarkable orbits. Paranasal sinuses and mastoid air cells are clear. Other: None. IMPRESSION: 1. No acute intracranial abnormality. 2. 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 Electronically Signed   By: Sebastian Ache M.D.   On: 01/03/2023 16:15   MR Lumbar Spine W Wo Contrast  Result Date:  01/02/2023 CLINICAL DATA:  Low back pain, cauda equina syndrome suspected. EXAM: MRI LUMBAR SPINE WITHOUT AND WITH CONTRAST TECHNIQUE: Multiplanar and multiecho pulse sequences of the lumbar spine were obtained without and with intravenous contrast. CONTRAST:  10mL GADAVIST GADOBUTROL 1 MMOL/ML IV SOLN COMPARISON:  None Available. FINDINGS: Segmentation:  Standard. Alignment:  Physiologic. Vertebrae:  No fracture, evidence of discitis, or bone lesion. Conus medullaris and cauda equina: Conus extends to the L1 level. Conus and cauda equina appear normal. No abnormal contrast enhancement identified. Paraspinal and other soft tissues: Negative. Disc levels: T12-L1: No spinal canal or neural foraminal stenosis. L1-2: No spinal canal or neural foraminal stenosis. L2-3: Tiny left foraminal/far lateral disc protrusion and mild facet degenerative changes. No significant spinal canal or neural foraminal stenosis. L3-4: Mild-to-moderate bilateral facet degenerative changes, left greater than right. No significant spinal canal or neural foraminal stenosis. L4-5: Minimal disc bulge. Moderate hypertrophic right facet degenerative changes with mild periarticular soft tissue edema. Mild left facet degenerative changes. No significant spinal canal or neural foraminal stenosis. L5-S1: Mild facet degenerative changes. No spinal canal or neural foraminal stenosis. IMPRESSION:  1. Degenerative changes of the lumbar spine, more pronounced at the level of the facet joints, particularly at L4-5 where there is mild periarticular soft tissue edema on the right. 2. No high-grade spinal canal or neural foraminal stenosis at any level. Electronically Signed   By: Baldemar Lenis M.D.   On: 01/02/2023 13:47   MR BRAIN WO CONTRAST  Result Date: 12/27/2022 CLINICAL DATA:  Headache, neuro deficit; Headache, intracranial hypertension features EXAM: MRI HEAD WITHOUT CONTRAST MRV HEAD WITHOUT AND WITH CONTRAST TECHNIQUE: Multiplanar,  multi-echo pulse sequences of the brain and surrounding structures were acquired without intravenous contrast. Angiographic images of the intracranial venous structures were acquired using MRV technique with and without intravenous contrast. CONTRAST:  10 mL Gadavist. COMPARISON:  CT head 12/25/2022. FINDINGS: MRI HEAD WITHOUT CONTRAST Brain: No acute infarction, hemorrhage, hydrocephalus, extra-axial collection or mass lesion. Partially empty sella. Mildly prominent Meckel's caves. Vascular: Major arterial flow voids are maintained skull base. Skull and upper cervical spine: Normal marrow signal. Sinuses/Orbits: No acute or significant finding. MR VENOGRAM WITHOUT CONTRAST No evidence of dural venous sinus thrombosis. Specifically, the superior sagittal, straight, sigmoid, transverse and visualized deep cerebral veins are patent. The left transverse sinus is narrowed. IMPRESSION: 1. No evidence of acute intracranial abnormality. 2. Partially empty sella, narrowed left transverse sinus and mildly prominent Meckel's caves, which is nonspecific but can be seen with idiopathic intracranial hypertension. 3. No evidence of dural venous sinus thrombosis. Electronically Signed   By: Feliberto Harts M.D.   On: 12/27/2022 17:28   MR MRV HEAD W WO CONTRAST  Result Date: 12/27/2022 CLINICAL DATA:  Headache, neuro deficit; Headache, intracranial hypertension features EXAM: MRI HEAD WITHOUT CONTRAST MRV HEAD WITHOUT AND WITH CONTRAST TECHNIQUE: Multiplanar, multi-echo pulse sequences of the brain and surrounding structures were acquired without intravenous contrast. Angiographic images of the intracranial venous structures were acquired using MRV technique with and without intravenous contrast. CONTRAST:  10 mL Gadavist. COMPARISON:  CT head 12/25/2022. FINDINGS: MRI HEAD WITHOUT CONTRAST Brain: No acute infarction, hemorrhage, hydrocephalus, extra-axial collection or mass lesion. Partially empty sella. Mildly prominent  Meckel's caves. Vascular: Major arterial flow voids are maintained skull base. Skull and upper cervical spine: Normal marrow signal. Sinuses/Orbits: No acute or significant finding. MR VENOGRAM WITHOUT CONTRAST No evidence of dural venous sinus thrombosis. Specifically, the superior sagittal, straight, sigmoid, transverse and visualized deep cerebral veins are patent. The left transverse sinus is narrowed. IMPRESSION: 1. No evidence of acute intracranial abnormality. 2. Partially empty sella, narrowed left transverse sinus and mildly prominent Meckel's caves, which is nonspecific but can be seen with idiopathic intracranial hypertension. 3. No evidence of dural venous sinus thrombosis. Electronically Signed   By: Feliberto Harts M.D.   On: 12/27/2022 17:28   DG Lumbar Puncture Fluoro Guide  Result Date: 12/25/2022 CLINICAL DATA:  Idiopathic intracranial hypertension. EXAM: DIAGNOSTIC LUMBAR PUNCTURE UNDER FLUOROSCOPIC GUIDANCE COMPARISON:  None Available. FLUOROSCOPY: Radiation Exposure Index (as provided by the fluoroscopic device): 10.9 mGy Kerma PROCEDURE: Informed consent was obtained from the patient prior to the procedure, including potential complications of headache, allergy, and pain. With the patient prone, the lower back was prepped with Betadine. 1% Lidocaine was used for local anesthesia. Lumbar puncture was performed at the L3-4 level using a 20 gauge needle with return of clear CSF. 17.25 ml of CSF were obtained for laboratory studies. The patient tolerated the procedure well and there were no apparent complications. IMPRESSION: Successful lumbar puncture under fluoroscopy with withdrawal of 17.25  cc clear CSF. Electronically Signed   By: Leanna Battles M.D.   On: 12/25/2022 14:00   CT Head Wo Contrast  Result Date: 12/25/2022 CLINICAL DATA:  Provided history: Headache, increasing frequency or severity. EXAM: CT HEAD WITHOUT CONTRAST TECHNIQUE: Contiguous axial images were obtained from the  base of the skull through the vertex without intravenous contrast. RADIATION DOSE REDUCTION: This exam was performed according to the departmental dose-optimization program which includes automated exposure control, adjustment of the mA and/or kV according to patient size and/or use of iterative reconstruction technique. COMPARISON:  Head CT 05/15/2022. FINDINGS: Brain: Cerebral volume is normal. Partially empty sella turcica. There is no acute intracranial hemorrhage. No demarcated cortical infarct. No extra-axial fluid collection. No evidence of an intracranial mass. No midline shift. Vascular: No hyperdense vessel. Skull: No calvarial fracture or aggressive osseous lesion. Sinuses/Orbits: No orbital mass or acute orbital finding. No significant paranasal sinus disease at the imaged levels. IMPRESSION: 1. No evidence of an acute intracranial abnormality. 2. Partially empty sella turcica. This finding can reflect incidental anatomic variation, or alternatively, it can be associated with idiopathic intracranial hypertension (pseudotumor cerebri). Electronically Signed   By: Jackey Loge D.O.   On: 12/25/2022 09:06    Microbiology: Results for orders placed or performed during the hospital encounter of 01/02/23  CSF culture w Gram Stain     Status: None (Preliminary result)   Collection Time: 01/03/23  3:58 PM   Specimen: Lumbar Puncture; Cerebrospinal Fluid  Result Value Ref Range Status   Specimen Description   Final    LUMBAR Performed at [facility], [address] [place], [place] [address]    Special Requests   Final    NONE Performed at [facility], [address], [place], [place] [address]    Gram Stain   Final    NO ORGANISMS SEEN NO WBC SEEN Gram Stain Report Called to,Read Back By and Verified With: RN odongo at 1645 01/03/23 [place] a Performed at [facility], [address] [place], [place] [address]    Culture   Final     NO GROWTH 3 DAYS Performed at [facility], [address] [place], [place] [address]    Report Status PENDING  Incomplete   Labs: CBC: Recent Labs  Lab 01/02/23 1132 01/03/23 0340 01/04/23 0516  WBC 9.7 6.5 7.0  NEUTROABS 7.4  --   --   HGB 13.0 10.6* 10.6*  HCT 40.0 32.5* 32.0*  MCV 86.8 87.8 87.2  PLT 195 141* 147*   Basic Metabolic Panel: Recent Labs  Lab 01/02/23 1132 01/03/23 0340 01/03/23 2100 01/04/23 0516  NA 135 136 136 138  K 3.6 3.4* 3.2* 3.6  CL 105 109 109 110  CO2 20* 20* 21* 20*  GLUCOSE 111* 107* 93 92  BUN 22* 22* 17 18  CREATININE 2.34* 1.23* 0.95 0.90  CALCIUM 9.0 8.2* 8.6* 8.8*  MG  --   --  2.0 1.9   Liver Function Tests: Recent Labs  Lab 01/02/23 1132  AST 20  ALT 43  ALKPHOS 94  BILITOT 0.4  PROT 8.0  ALBUMIN 3.9   CBG: No results for input(s): "GLUCAP" in the last 168 hours.  Discharge time spent: greater than 30 minutes.  Author: Lynden Oxford, MD  [facility] 01/04/2023

## 2023-01-07 LAB — CSF CULTURE W GRAM STAIN
Culture: NO GROWTH
Gram Stain: NONE SEEN

## 2023-01-08 ENCOUNTER — Emergency Department (HOSPITAL_COMMUNITY): Payer: Medicaid Other

## 2023-01-08 ENCOUNTER — Telehealth: Payer: Self-pay

## 2023-01-08 ENCOUNTER — Other Ambulatory Visit: Payer: Self-pay

## 2023-01-08 ENCOUNTER — Inpatient Hospital Stay (HOSPITAL_COMMUNITY)
Admission: EM | Admit: 2023-01-08 | Discharge: 2023-01-11 | DRG: 103 | Disposition: A | Discharge status: Home / Self Care | Payer: Medicaid Other | Attending: Family Medicine | Admitting: Family Medicine

## 2023-01-08 DIAGNOSIS — Z818 Family history of other mental and behavioral disorders: Secondary | ICD-10-CM | POA: Diagnosis not present

## 2023-01-08 DIAGNOSIS — Z91013 Allergy to seafood: Secondary | ICD-10-CM | POA: Diagnosis not present

## 2023-01-08 DIAGNOSIS — Z6841 Body Mass Index (BMI) 40.0 and over, adult: Secondary | ICD-10-CM

## 2023-01-08 DIAGNOSIS — Z885 Allergy status to narcotic agent status: Secondary | ICD-10-CM | POA: Diagnosis not present

## 2023-01-08 DIAGNOSIS — R001 Bradycardia, unspecified: Secondary | ICD-10-CM | POA: Diagnosis not present

## 2023-01-08 DIAGNOSIS — Z1152 Encounter for screening for COVID-19: Secondary | ICD-10-CM | POA: Diagnosis not present

## 2023-01-08 DIAGNOSIS — Z8249 Family history of ischemic heart disease and other diseases of the circulatory system: Secondary | ICD-10-CM | POA: Diagnosis not present

## 2023-01-08 DIAGNOSIS — G932 Benign intracranial hypertension: Principal | ICD-10-CM | POA: Diagnosis present

## 2023-01-08 DIAGNOSIS — Z79899 Other long term (current) drug therapy: Secondary | ICD-10-CM | POA: Diagnosis not present

## 2023-01-08 DIAGNOSIS — E039 Hypothyroidism, unspecified: Secondary | ICD-10-CM | POA: Diagnosis present

## 2023-01-08 DIAGNOSIS — F32A Depression, unspecified: Secondary | ICD-10-CM | POA: Diagnosis present

## 2023-01-08 DIAGNOSIS — F17210 Nicotine dependence, cigarettes, uncomplicated: Secondary | ICD-10-CM | POA: Diagnosis present

## 2023-01-08 DIAGNOSIS — G43909 Migraine, unspecified, not intractable, without status migrainosus: Secondary | ICD-10-CM | POA: Diagnosis present

## 2023-01-08 DIAGNOSIS — E872 Acidosis, unspecified: Secondary | ICD-10-CM | POA: Diagnosis present

## 2023-01-08 DIAGNOSIS — I10 Essential (primary) hypertension: Secondary | ICD-10-CM | POA: Diagnosis present

## 2023-01-08 DIAGNOSIS — R5383 Other fatigue: Secondary | ICD-10-CM

## 2023-01-08 DIAGNOSIS — R9431 Abnormal electrocardiogram [ECG] [EKG]: Secondary | ICD-10-CM | POA: Diagnosis not present

## 2023-01-08 DIAGNOSIS — D638 Anemia in other chronic diseases classified elsewhere: Secondary | ICD-10-CM | POA: Diagnosis present

## 2023-01-08 DIAGNOSIS — G8929 Other chronic pain: Secondary | ICD-10-CM

## 2023-01-08 DIAGNOSIS — F419 Anxiety disorder, unspecified: Secondary | ICD-10-CM | POA: Diagnosis present

## 2023-01-08 DIAGNOSIS — Z91040 Latex allergy status: Secondary | ICD-10-CM

## 2023-01-08 LAB — COMPREHENSIVE METABOLIC PANEL
ALT: 22 U/L (ref 0–44)
AST: 14 U/L — ABNORMAL LOW (ref 15–41)
Albumin: 3.3 g/dL — ABNORMAL LOW (ref 3.5–5.0)
Alkaline Phosphatase: 82 U/L (ref 38–126)
Anion gap: 8 (ref 5–15)
BUN: 7 mg/dL (ref 6–20)
CO2: 18 mmol/L — ABNORMAL LOW (ref 22–32)
Calcium: 8.7 mg/dL — ABNORMAL LOW (ref 8.9–10.3)
Chloride: 108 mmol/L (ref 98–111)
Creatinine, Ser: 0.91 mg/dL (ref 0.44–1.00)
GFR, Estimated: >60 mL/min (ref >60)
Glucose, Bld: 102 mg/dL — ABNORMAL HIGH (ref 70–99)
Potassium: 3.6 mmol/L (ref 3.5–5.1)
Sodium: 134 mmol/L — ABNORMAL LOW (ref 135–145)
Total Bilirubin: 0.6 mg/dL (ref 0.3–1.2)
Total Protein: 6.8 g/dL (ref 6.5–8.1)

## 2023-01-08 LAB — CBC WITH DIFFERENTIAL/PLATELET
Abs Immature Granulocytes: 0.02 10*3/uL (ref 0.00–0.07)
Basophils Absolute: 0 10*3/uL (ref 0.0–0.1)
Basophils Relative: 0 %
Eosinophils Absolute: 0.1 10*3/uL (ref 0.0–0.5)
Eosinophils Relative: 2 %
HCT: 33.2 % — ABNORMAL LOW (ref 36.0–46.0)
Hemoglobin: 10.8 g/dL — ABNORMAL LOW (ref 12.0–15.0)
Immature Granulocytes: 0 %
Lymphocytes Relative: 31 %
Lymphs Abs: 2.3 10*3/uL (ref 0.7–4.0)
MCH: 28.6 pg (ref 26.0–34.0)
MCHC: 32.5 g/dL (ref 30.0–36.0)
MCV: 88.1 fL (ref 80.0–100.0)
Monocytes Absolute: 0.4 10*3/uL (ref 0.1–1.0)
Monocytes Relative: 6 %
Neutro Abs: 4.3 10*3/uL (ref 1.7–7.7)
Neutrophils Relative %: 61 %
Platelets: 168 10*3/uL (ref 150–400)
RBC: 3.77 MIL/uL — ABNORMAL LOW (ref 3.87–5.11)
RDW: 14.6 % (ref 11.5–15.5)
WBC: 7.2 10*3/uL (ref 4.0–10.5)
nRBC: 0 % (ref 0.0–0.2)

## 2023-01-08 LAB — HCG, QUANTITATIVE, PREGNANCY: hCG, Beta Chain, Quant, S: <1 m[IU]/mL (ref <5)

## 2023-01-08 LAB — PREGNANCY, URINE: Preg Test, Ur: NEGATIVE

## 2023-01-08 LAB — TROPONIN I (HIGH SENSITIVITY): Troponin I (High Sensitivity): 3 ng/L (ref <18)

## 2023-01-08 MED ORDER — HEPARIN SODIUM (PORCINE) 5000 UNIT/ML IJ SOLN
5000.0000 [IU] | Freq: Three times a day (TID) | INTRAMUSCULAR | Status: DC
  Administered 2023-01-09 – 2023-01-10 (×5): 5000 [IU] via SUBCUTANEOUS
  Filled 2023-01-08 (×5): qty 1

## 2023-01-08 MED ORDER — DIPHENHYDRAMINE HCL 50 MG/ML IJ SOLN
25.0000 mg | Freq: Once | INTRAMUSCULAR | Status: AC
  Administered 2023-01-08: 25 mg via INTRAVENOUS
  Filled 2023-01-08: qty 1

## 2023-01-08 MED ORDER — PROCHLORPERAZINE EDISYLATE 10 MG/2ML IJ SOLN
10.0000 mg | Freq: Once | INTRAMUSCULAR | Status: AC
  Administered 2023-01-08: 10 mg via INTRAVENOUS
  Filled 2023-01-08: qty 2

## 2023-01-08 MED ORDER — ATROPINE SULFATE 0.4 MG/ML IV SOLN
0.4000 mg | Freq: Once | INTRAVENOUS | Status: AC
  Administered 2023-01-08: 0.4 mg via INTRAVENOUS
  Filled 2023-01-08: qty 1

## 2023-01-08 NOTE — ED Provider Notes (Signed)
[place] EMERGENCY DEPARTMENT AT [facility] Provider Note   CSN: [id number] Arrival date & time: 01/08/23  1729     History {Add pertinent medical, surgical, social history, OB history to HPI:1} Chief Complaint  Patient presents with   Fatigue   Headache    Tabitha Holland is a 37 y.o. female.  HPI      37yo female with history of idiopathic intracranial hypertension, hypertension, migraine, depression, anxiety, obesity,   Headache is usual, tolerable for her, no new visual deficits Here because feeling exhausted, sleeping more than usual.  HR was low at home 32 Eating and drinking normally today Denies numbness, weakness, difficulty talking or walking, visual changes or facial droop.    Home Medications Prior to Admission medications   Medication Sig Start Date End Date Taking? Authorizing Provider  acetaminophen (TYLENOL) 325 MG tablet Take 650 mg by mouth once as needed for mild pain or moderate pain.    [provider]  acetaZOLAMIDE (DIAMOX) 250 MG tablet Take 2 tablets (500 mg total) by mouth 2 (two) times daily. 01/04/23 08/02/23  Rolly Salter, MD  caffeine 200 MG TABS tablet Take 1 tablet (200 mg total) by mouth every 4 (four) hours as needed. 01/04/23   Rolly Salter, MD  docusate sodium (COLACE) 100 MG capsule Take 1 capsule (100 mg total) by mouth 2 (two) times daily. 01/04/23   Rolly Salter, MD  FLUoxetine (PROZAC) 40 MG capsule Take 1 capsule (40 mg total) by mouth daily. 01/05/23   Rolly Salter, MD  LORazepam (ATIVAN) 1 MG tablet TAKE 1 TABLET(1 MG) BY MOUTH TWICE DAILY AS NEEDED FOR ANXIETY OR PANIC Patient taking differently: Take 1 mg by mouth 2 (two) times daily. 01/02/23   [name], MD  methocarbamol (ROBAXIN) 500 MG tablet Take 1 tablet (500 mg total) by mouth every 8 (eight) hours as needed for muscle spasms. 01/04/23   Rolly Salter, MD  polyethylene glycol (MIRALAX / GLYCOLAX) 17 g packet Take 17 g by mouth daily. 01/05/23    Rolly Salter, MD  Ruxolitinib Phosphate (OPZELURA) 1.5 % CREA Apply 1 Application topically daily as needed (eczema).    [provider]  spironolactone (ALDACTONE) 50 MG tablet TAKE 1 TABLET(50 MG) BY MOUTH AT BEDTIME 08/30/22   [name], MD  topiramate (TOPAMAX) 100 MG tablet Take 1 tablet (100 mg total) by mouth 2 (two) times daily. 10/12/22 05/10/23  [name], MD      Allergies    Butalbital, Codeine, Fish allergy, Hydrocodone, Latex, Percocet [oxycodone-acetaminophen], and Shellfish allergy    Review of Systems   Review of Systems  Physical Exam Updated Vital Signs BP 118/62   Pulse (!) 42   Temp 97.8 F (36.6 C)   Resp 16   LMP 11/23/2022   SpO2 99%  Physical Exam  ED Results / Procedures / Treatments   Labs (all labs ordered are listed, but only abnormal results are displayed) Labs Reviewed  CBC WITH DIFFERENTIAL/PLATELET - Abnormal; Notable for the following components:      Result Value   RBC 3.77 (*)    Hemoglobin 10.8 (*)    HCT 33.2 (*)    All other components within normal limits  COMPREHENSIVE METABOLIC PANEL    EKG None  Radiology No results found.  Procedures Procedures  {Document cardiac monitor, telemetry assessment procedure when appropriate:1}  Medications Ordered in ED Medications - No data to display  ED Course/  Medical Decision Making/ A&P   {   Click here for ABCD2, HEART and other calculatorsREFRESH Note before signing :1}                               ***  {Document critical care time when appropriate:1} {Document review of labs and clinical decision tools ie heart score, Chads2Vasc2 etc:1}  {Document your independent review of radiology images, and any outside records:1} {Document your discussion with family members, caretakers, and with consultants:1} {Document social determinants of health affecting pt's care:1} {Document your decision making why or why not admission, treatments were needed:1} Final  Clinical Impression(s) / ED Diagnoses Final diagnoses:  None    Rx / DC Orders ED Discharge Orders     None

## 2023-01-08 NOTE — ED Triage Notes (Signed)
Patient BIB EMS from home c/o headache . Per report pt worsening headache and lweakness this afternoon. Pt hx of IIH.  Multiple LP done this week.

## 2023-01-08 NOTE — Transitions of Care (Post Inpatient/ED Visit) (Signed)
01/08/2023  Name: Tabitha Holland MRN: [id number] DOB: [birth year]  Today's TOC FU Call Status: Today's TOC FU Call Status:: Successful TOC FU Call Completed TOC FU Call Complete Date: 01/08/23 Patient's Name and Date of Birth confirmed.  Transition Care Management Follow-up Telephone Call Date of Discharge: 01/04/23 Discharge Facility: [facility]) Type of Discharge: Inpatient Admission Primary Inpatient Discharge Diagnosis:: weakness How have you been since you were released from the hospital?: Better Any questions or concerns?: No  Items Reviewed: Did you receive and understand the discharge instructions provided?: No Medications obtained,verified, and reconciled?: Yes (Medications Reviewed) Any new allergies since your discharge?: No Dietary orders reviewed?: Yes Do you have support at home?: Yes People in Home: spouse  Medications Reviewed Today: Medications Reviewed Today     Reviewed by Karena Addison, LPN (Licensed Practical Nurse) on 01/08/23 at 1147  Med List Status: <None>   Medication Order Taking? Sig Documenting Provider Last Dose Status Informant  acetaminophen (TYLENOL) 325 MG tablet [id number] No Take 650 mg by mouth once as needed for mild pain or moderate pain. [provider] Past Week Active Self, Pharmacy Records  acetaZOLAMIDE (DIAMOX) 250 MG tablet [id number]  Take 2 tablets (500 mg total) by mouth 2 (two) times daily. Rolly Salter, MD  Active   caffeine 200 MG TABS tablet [id number]  Take 1 tablet (200 mg total) by mouth every 4 (four) hours as needed. Rolly Salter, MD  Active   diclofenac Sodium (VOLTAREN) 1 % topical gel 2 g [id number]   Bess Kinds, MD  Active   docusate sodium (COLACE) 100 MG capsule [id number]  Take 1 capsule (100 mg total) by mouth 2 (two) times daily. Rolly Salter, MD  Active   FLUoxetine (PROZAC) 40 MG capsule [id number]  Take 1 capsule (40 mg total) by mouth daily. Rolly Salter, MD  Active   LORazepam  (ATIVAN) 1 MG tablet [id number] No TAKE 1 TABLET(1 MG) BY MOUTH TWICE DAILY AS NEEDED FOR ANXIETY OR PANIC  Patient taking differently: Take 1 mg by mouth 2 (two) times daily.   [name], MD 01/01/2023 Active Self, Pharmacy Records  methocarbamol (ROBAXIN) 500 MG tablet [id number]  Take 1 tablet (500 mg total) by mouth every 8 (eight) hours as needed for muscle spasms. Rolly Salter, MD  Active   polyethylene glycol (MIRALAX / GLYCOLAX) 17 g packet [id number]  Take 17 g by mouth daily. Rolly Salter, MD  Active   Ruxolitinib Phosphate (OPZELURA) 1.5 % CREA [id number] No Apply 1 Application topically daily as needed (eczema). [provider] 01/01/2023 Active Self, Pharmacy Records           Med Note (COFFELL, [name]   Fri Oct 21, 2021 11:37 AM) Pt states she is taking this medication PRN - No fill history per dispense report/Dr. Tiajuana Amass.  spironolactone (ALDACTONE) 50 MG tablet [id number] No TAKE 1 TABLET(50 MG) BY MOUTH AT BEDTIME [name], MD 01/02/2023 Active Self, Pharmacy Records  topiramate (TOPAMAX) 100 MG tablet [id number] No Take 1 tablet (100 mg total) by mouth 2 (two) times daily. [name], MD 01/02/2023 Active Self, Pharmacy Records            Home Care and Equipment/Supplies: Were Home Health Services Ordered?: NA Any new equipment or medical supplies ordered?: NA  Functional Questionnaire: Do you need assistance with bathing/showering or dressing?: No Do you need assistance with meal preparation?: No Do you need assistance with eating?: No Do  you have difficulty maintaining continence: No Do you need assistance with getting out of bed/getting out of a chair/moving?: No Do you have difficulty managing or taking your medications?: No  Follow up appointments reviewed: PCP Follow-up appointment confirmed?: NA Specialist Hospital Follow-up appointment confirmed?: NA Do you need transportation to your follow-up appointment?: No Do you understand care  options if your condition(s) worsen?: Yes-patient verbalized understanding    SIGNATURE Karena Addison, LPN [facility] Nurse Health Advisor Direct Dial [phone]

## 2023-01-09 ENCOUNTER — Encounter: Payer: Self-pay | Admitting: Student

## 2023-01-09 ENCOUNTER — Encounter (HOSPITAL_COMMUNITY): Payer: Self-pay | Admitting: Internal Medicine

## 2023-01-09 ENCOUNTER — Inpatient Hospital Stay (HOSPITAL_COMMUNITY): Payer: Medicaid Other

## 2023-01-09 DIAGNOSIS — R9431 Abnormal electrocardiogram [ECG] [EKG]: Secondary | ICD-10-CM | POA: Diagnosis not present

## 2023-01-09 DIAGNOSIS — R001 Bradycardia, unspecified: Secondary | ICD-10-CM

## 2023-01-09 LAB — ECHOCARDIOGRAM COMPLETE
AR max vel: 4.01 cm2
AV Area VTI: 4.14 cm2
AV Area mean vel: 3.74 cm2
AV Mean grad: 2 mmHg
AV Peak grad: 4.3 mmHg
Ao pk vel: 1.04 m/s
Height: 71 in
S' Lateral: 3.1 cm
Weight: 5650.83 [oz_av]

## 2023-01-09 LAB — BLOOD GAS, VENOUS
Acid-base deficit: 3.5 mmol/L — ABNORMAL HIGH (ref 0.0–2.0)
Bicarbonate: 22.7 mmol/L (ref 20.0–28.0)
Drawn by: 7020
O2 Saturation: 41.3 %
Patient temperature: 36.4
pCO2, Ven: 43 mmHg — ABNORMAL LOW (ref 44–60)
pH, Ven: 7.33 (ref 7.25–7.43)
pO2, Ven: <31 mmHg — CL (ref 32–45)

## 2023-01-09 LAB — CBC
HCT: 33.8 % — ABNORMAL LOW (ref 36.0–46.0)
Hemoglobin: 10.8 g/dL — ABNORMAL LOW (ref 12.0–15.0)
MCH: 28.3 pg (ref 26.0–34.0)
MCHC: 32 g/dL (ref 30.0–36.0)
MCV: 88.7 fL (ref 80.0–100.0)
Platelets: 172 10*3/uL (ref 150–400)
RBC: 3.81 MIL/uL — ABNORMAL LOW (ref 3.87–5.11)
RDW: 14.5 % (ref 11.5–15.5)
WBC: 7.2 10*3/uL (ref 4.0–10.5)
nRBC: 0 % (ref 0.0–0.2)

## 2023-01-09 LAB — TSH: TSH: 6.666 u[IU]/mL — ABNORMAL HIGH (ref 0.350–4.500)

## 2023-01-09 LAB — BASIC METABOLIC PANEL
Anion gap: 6 (ref 5–15)
BUN: 8 mg/dL (ref 6–20)
CO2: 23 mmol/L (ref 22–32)
Calcium: 9.2 mg/dL (ref 8.9–10.3)
Chloride: 109 mmol/L (ref 98–111)
Creatinine, Ser: 0.89 mg/dL (ref 0.44–1.00)
GFR, Estimated: >60 mL/min (ref >60)
Glucose, Bld: 107 mg/dL — ABNORMAL HIGH (ref 70–99)
Potassium: 3.5 mmol/L (ref 3.5–5.1)
Sodium: 138 mmol/L (ref 135–145)

## 2023-01-09 LAB — PHOSPHORUS: Phosphorus: 3.8 mg/dL (ref 2.5–4.6)

## 2023-01-09 LAB — T4, FREE: Free T4: 0.85 ng/dL (ref 0.61–1.12)

## 2023-01-09 LAB — SARS CORONAVIRUS 2 BY RT PCR: SARS Coronavirus 2 by RT PCR: NEGATIVE

## 2023-01-09 LAB — MRSA NEXT GEN BY PCR, NASAL: MRSA by PCR Next Gen: NOT DETECTED

## 2023-01-09 LAB — MAGNESIUM: Magnesium: 2.2 mg/dL (ref 1.7–2.4)

## 2023-01-09 MED ORDER — POLYETHYLENE GLYCOL 3350 17 G PO PACK
17.0000 g | PACK | Freq: Every day | ORAL | Status: DC | PRN
  Administered 2023-01-09: 17 g via ORAL
  Filled 2023-01-09: qty 1

## 2023-01-09 MED ORDER — ATROPINE SULFATE 0.4 MG/ML IV SOLN: 0.4000 mg | INTRAVENOUS | Status: DC | PRN

## 2023-01-09 MED ORDER — FLUOXETINE HCL 20 MG PO CAPS
40.0000 mg | ORAL_CAPSULE | Freq: Every day | ORAL | Status: DC
  Administered 2023-01-09 – 2023-01-11 (×3): 40 mg via ORAL
  Filled 2023-01-09 (×3): qty 2

## 2023-01-09 MED ORDER — VALPROATE SODIUM 100 MG/ML IV SOLN
500.0000 mg | Freq: Once | INTRAVENOUS | Status: AC
  Administered 2023-01-09: 500 mg via INTRAVENOUS
  Filled 2023-01-09: qty 5

## 2023-01-09 MED ORDER — ACETAMINOPHEN 325 MG PO TABS
650.0000 mg | ORAL_TABLET | Freq: Four times a day (QID) | ORAL | Status: DC | PRN
  Administered 2023-01-09: 650 mg via ORAL
  Filled 2023-01-09: qty 2

## 2023-01-09 MED ORDER — ORAL CARE MOUTH RINSE: 15.0000 mL | OROMUCOSAL | Status: DC | PRN

## 2023-01-09 MED ORDER — DICLOFENAC SODIUM 1 % EX GEL: 2.0000 g | Freq: Four times a day (QID) | CUTANEOUS | Status: DC | PRN

## 2023-01-09 MED ORDER — CAFFEINE 200 MG PO TABS
200.0000 mg | ORAL_TABLET | ORAL | Status: DC | PRN
  Administered 2023-01-09 – 2023-01-10 (×2): 200 mg via ORAL
  Filled 2023-01-09 (×4): qty 1

## 2023-01-09 MED ORDER — DOCUSATE SODIUM 100 MG PO CAPS
100.0000 mg | ORAL_CAPSULE | Freq: Two times a day (BID) | ORAL | Status: DC
  Administered 2023-01-09 – 2023-01-10 (×3): 100 mg via ORAL
  Filled 2023-01-09 (×4): qty 1

## 2023-01-09 MED ORDER — LORAZEPAM 1 MG PO TABS: 1.0000 mg | ORAL_TABLET | Freq: Two times a day (BID) | ORAL | Status: DC | PRN

## 2023-01-09 MED ORDER — LACTATED RINGERS IV BOLUS
500.0000 mL | Freq: Once | INTRAVENOUS | Status: AC
  Administered 2023-01-09: 500 mL via INTRAVENOUS

## 2023-01-09 MED ORDER — ATROPINE SULFATE 1 MG/10ML IJ SOSY
1.0000 mg | PREFILLED_SYRINGE | INTRAMUSCULAR | Status: DC | PRN
  Administered 2023-01-09: 1 mg via INTRAVENOUS
  Filled 2023-01-09: qty 10

## 2023-01-09 MED ORDER — MELATONIN 5 MG PO TABS
5.0000 mg | ORAL_TABLET | Freq: Every evening | ORAL | Status: DC | PRN
  Filled 2023-01-09: qty 1

## 2023-01-09 MED ORDER — ATROPINE SULFATE 1 MG/10ML IJ SOSY
0.5000 mg | PREFILLED_SYRINGE | INTRAMUSCULAR | Status: DC | PRN
  Administered 2023-01-10: 0.5 mg via INTRAVENOUS

## 2023-01-09 MED ORDER — ACETAZOLAMIDE 250 MG PO TABS
500.0000 mg | ORAL_TABLET | Freq: Two times a day (BID) | ORAL | Status: DC
  Administered 2023-01-09 – 2023-01-10 (×4): 500 mg via ORAL
  Filled 2023-01-09 (×4): qty 2

## 2023-01-09 MED ORDER — MAGNESIUM SULFATE 2 GM/50ML IV SOLN
2.0000 g | Freq: Once | INTRAVENOUS | Status: AC
  Administered 2023-01-09: 2 g via INTRAVENOUS
  Filled 2023-01-09: qty 50

## 2023-01-09 MED ORDER — RUXOLITINIB PHOSPHATE 1.5 % EX CREA: 1.0000 | TOPICAL_CREAM | Freq: Every day | CUTANEOUS | Status: DC | PRN

## 2023-01-09 MED ORDER — PROCHLORPERAZINE EDISYLATE 10 MG/2ML IJ SOLN
5.0000 mg | Freq: Four times a day (QID) | INTRAMUSCULAR | Status: DC | PRN
  Administered 2023-01-10: 5 mg via INTRAVENOUS
  Filled 2023-01-09: qty 2

## 2023-01-09 MED ORDER — ATROPINE SULFATE 1 MG/10ML IJ SOSY: 0.5000 mg | PREFILLED_SYRINGE | INTRAMUSCULAR | Status: DC | PRN

## 2023-01-09 MED ORDER — ATROPINE SULFATE 1 MG/10ML IJ SOSY
0.5000 mg | PREFILLED_SYRINGE | INTRAMUSCULAR | Status: DC | PRN
  Administered 2023-01-09: 0.5 mg via INTRAVENOUS
  Filled 2023-01-09: qty 10

## 2023-01-09 MED ORDER — CHLORHEXIDINE GLUCONATE CLOTH 2 % EX PADS
6.0000 | MEDICATED_PAD | Freq: Every day | CUTANEOUS | Status: DC
  Administered 2023-01-09: 6 via TOPICAL

## 2023-01-09 MED ORDER — ATROPINE SULFATE 1 MG/10ML IJ SOSY
PREFILLED_SYRINGE | INTRAMUSCULAR | Status: AC
  Filled 2023-01-09: qty 10

## 2023-01-09 MED ORDER — DEXAMETHASONE SODIUM PHOSPHATE 10 MG/ML IJ SOLN
10.0000 mg | Freq: Once | INTRAMUSCULAR | Status: AC
  Administered 2023-01-09: 10 mg via INTRAVENOUS
  Filled 2023-01-09: qty 1

## 2023-01-09 NOTE — TOC CM/SW Note (Signed)
Transition of Care [facility]) - Inpatient Brief Assessment   Patient Details  Name: TALAYSIA VELAZQUES MRN: [id number] Date of Birth: Apr 01, 1986  Transition of Care [facility]) CM/SW Contact:    Howell Rucks, RN Phone Number: 01/09/2023, 3:24 PM   Clinical Narrative: Met with pt at bedside to introduce role of TOC/NCM and review for dc planning. Pt reports she has a PCP and pharmacy in place, reports she has PCP appt this Thursday, 01/11/23, reports no current home care services or home DME, reports she feels safe returning home with support for her spouse, confirmed has transportation available at dc. TOC Brief Assessment completed. No TOC needs identified at this time.     Transition of Care Asessment: Insurance and Status: Insurance coverage has been reviewed Patient has primary care physician: Yes Home environment has been reviewed: resides with spouse and children Prior level of function:: Independent Prior/Current Home Services: No current home services Social Determinants of Health Reivew: SDOH reviewed no interventions necessary Readmission risk has been reviewed: Yes Transition of care needs: no transition of care needs at this time

## 2023-01-09 NOTE — H&P (Addendum)
History and Physical  Tabitha Holland:[id number] DOB: 1985-05-03 DOA: 01/08/2023  Referring physician: Dr. Dalene Seltzer, EDP  PCP: [name], MD  Outpatient Specialists: Neurology. Patient coming from: Home.  Chief Complaint: Generalized fatigue, headache.  HPI: Tabitha Holland is a 37 y.o. female with medical history significant for idiopathic intracranial hypertension (IIH), essential hypertension, chronic migraine, chronic anxiety/depression, severe morbid obesity, recently admitted with worsening headache/photophobia/photophobia, underwent LP on 12/25/2022 and 12/30/2022, LP showed elevated opening pressure, headache improved after removal of CSF, who presented today with complaints of generalized fatigue and worsening headache this afternoon.  She took her home topiramate without improvement.  In the ED, noted to be bradycardic with heart rate sustaining in the 30s.  Endorses a severe headache about a 9 out of 10.  Headache cocktail provided in the ED.  EDP discussed the case with cardiology, recommended atropine, and will see in consultation.  The patient was admitted by [facility], hospitalist service, to stepdown unit as inpatient status.  ED Course: Temperature 97.6.  BP 103/55, pulse 32, respiratory 17, O2 saturation 100% on room air.  Lab studies notable for serum sodium 134, serum bicarb 18, glucose 102, hemoglobin 10.8.  Review of Systems: Review of systems as noted in the HPI. All other systems reviewed and are negative.   Past Medical History:  Diagnosis Date   Anxiety    Depression    Gallstones    History of anemia    per pt on 02/02/2021   History of suicidal ideation 10/02/2019   Hx of wisdom tooth extraction    Hypertension    IIH (idiopathic intracranial hypertension)    Migraines    occasional per pt 02/02/2021   Morbid obesity (HCC)    Past Surgical History:  Procedure Laterality Date   CESAREAN SECTION N/A 10/28/2013   Procedure: CESAREAN SECTION;   Surgeon: Allie Bossier, MD;  Location: [facility] ORS;  Service: Obstetrics;  Laterality: N/A;   CESAREAN SECTION N/A 09/15/2015   Procedure: CESAREAN SECTION;  Surgeon: Catalina Antigua, MD;  Location: [facility] BIRTHING SUITES;  Service: Obstetrics;  Laterality: N/A;   CESAREAN SECTION N/A 05/26/2018   Procedure: CESAREAN SECTION;  Surgeon: Adam Phenix, MD;  Location: [facility] BIRTHING SUITES;  Service: Obstetrics;  Laterality: N/A;   CHOLECYSTECTOMY     DILATATION & CURETTAGE/HYSTEROSCOPY WITH MYOSURE N/A 02/09/2021   Procedure: DILATATION & CURETTAGE/HYSTEROSCOPY WITH MYOSURE;  Surgeon: Reva Bores, MD;  Location: [facility];  Service: Gynecology;  Laterality: N/A;  fibroid resection   WISDOM TOOTH EXTRACTION      Social History:  reports that she has been smoking cigarettes. She has never used smokeless tobacco. She reports that she does not currently use alcohol. She reports that she does not currently use drugs after having used the following drugs: Marijuana.   Allergies  Allergen Reactions   Butalbital Itching   Codeine Other (See Comments)    Migraines    Fish Allergy Itching   Hydrocodone Other (See Comments)    Migraines    Latex Itching   Percocet [Oxycodone-Acetaminophen] Other (See Comments)    Migraines    Shellfish Allergy Itching    Family History  Adopted: Yes  Problem Relation Age of Onset   Hypertension Sister    Bipolar disorder Brother    Schizophrenia Brother       Prior to Admission medications   Medication Sig Start Date End Date Taking? Authorizing Provider  acetaZOLAMIDE (DIAMOX) 250 MG tablet Take 2 tablets (  500 mg total) by mouth 2 (two) times daily. 01/04/23 08/02/23 Yes Rolly Salter, MD  FLUoxetine (PROZAC) 40 MG capsule Take 1 capsule (40 mg total) by mouth daily. 01/05/23  Yes Rolly Salter, MD  LORazepam (ATIVAN) 1 MG tablet TAKE 1 TABLET(1 MG) BY MOUTH TWICE DAILY AS NEEDED FOR ANXIETY OR PANIC Patient taking differently: Take 1 mg by mouth 2  (two) times daily. 01/02/23  Yes [name], MD  topiramate (TOPAMAX) 100 MG tablet Take 1 tablet (100 mg total) by mouth 2 (two) times daily. 10/12/22 05/10/23 Yes [name], MD  acetaminophen (TYLENOL) 325 MG tablet Take 650 mg by mouth once as needed for mild pain or moderate pain.    [provider]  caffeine 200 MG TABS tablet Take 1 tablet (200 mg total) by mouth every 4 (four) hours as needed. 01/04/23   Rolly Salter, MD  docusate sodium (COLACE) 100 MG capsule Take 1 capsule (100 mg total) by mouth 2 (two) times daily. 01/04/23   Rolly Salter, MD  methocarbamol (ROBAXIN) 500 MG tablet Take 1 tablet (500 mg total) by mouth every 8 (eight) hours as needed for muscle spasms. 01/04/23   Rolly Salter, MD  polyethylene glycol (MIRALAX / GLYCOLAX) 17 g packet Take 17 g by mouth daily. 01/05/23   Rolly Salter, MD  Ruxolitinib Phosphate (OPZELURA) 1.5 % CREA Apply 1 Application topically daily as needed (eczema).    [provider]  spironolactone (ALDACTONE) 50 MG tablet TAKE 1 TABLET(50 MG) BY MOUTH AT BEDTIME 08/30/22   [name], MD    Physical Exam: BP (!) 101/54   Pulse (!) 43   Temp 97.8 F (36.6 C)   Resp 10   LMP 11/23/2022   SpO2 100%   General: 37 y.o. year-old female well developed well nourished in no acute distress.  Alert and oriented x3. Cardiovascular: Bradycardic with no rubs or gallops.  No thyromegaly or JVD noted.  No lower extremity edema. 2/4 pulses in all 4 extremities. Respiratory: Clear to auscultation with no wheezes or rales. Good inspiratory effort. Abdomen: Soft nontender nondistended with normal bowel sounds x4 quadrants. Muskuloskeletal: No cyanosis, clubbing or edema noted bilaterally Neuro: CN II-XII intact, strength, sensation, reflexes Skin: No ulcerative lesions noted or rashes Psychiatry: Judgement and insight appear normal. Mood is appropriate for condition and setting          Labs on Admission:  Basic Metabolic  Panel: Recent Labs  Lab 01/02/23 1132 01/03/23 0340 01/03/23 2100 01/04/23 0516 01/08/23 1823  NA 135 136 136 138 134*  K 3.6 3.4* 3.2* 3.6 3.6  CL 105 109 109 110 108  CO2 20* 20* 21* 20* 18*  GLUCOSE 111* 107* 93 92 102*  BUN 22* 22* 17 18 7   CREATININE 2.34* 1.23* 0.95 0.90 0.91  CALCIUM 9.0 8.2* 8.6* 8.8* 8.7*  MG  --   --  2.0 1.9  --    Liver Function Tests: Recent Labs  Lab 01/02/23 1132 01/08/23 1823  AST 20 14*  ALT 43 22  ALKPHOS 94 82  BILITOT 0.4 0.6  PROT 8.0 6.8  ALBUMIN 3.9 3.3*   No results for input(s): "LIPASE", "AMYLASE" in the last 168 hours. No results for input(s): "AMMONIA" in the last 168 hours. CBC: Recent Labs  Lab 01/02/23 1132 01/03/23 0340 01/04/23 0516 01/08/23 1823  WBC 9.7 6.5 7.0 7.2  NEUTROABS 7.4  --   --  4.3  HGB 13.0  10.6* 10.6* 10.8*  HCT 40.0 32.5* 32.0* 33.2*  MCV 86.8 87.8 87.2 88.1  PLT 195 141* 147* 168   Cardiac Enzymes: No results for input(s): "CKTOTAL", "CKMB", "CKMBINDEX", "TROPONINI" in the last 168 hours.  BNP (last 3 results) No results for input(s): "BNP" in the last 8760 hours.  ProBNP (last 3 results) No results for input(s): "PROBNP" in the last 8760 hours.  CBG: No results for input(s): "GLUCAP" in the last 168 hours.  Radiological Exams on Admission: CT Head Wo Contrast  Result Date: 01/08/2023 CLINICAL DATA:  Headache, intracranial hypertension, bradycardia, fatigue EXAM: CT HEAD WITHOUT CONTRAST TECHNIQUE: Contiguous axial images were obtained from the base of the skull through the vertex without intravenous contrast. RADIATION DOSE REDUCTION: This exam was performed according to the departmental dose-optimization program which includes automated exposure control, adjustment of the mA and/or kV according to patient size and/or use of iterative reconstruction technique. COMPARISON:  None Available. FINDINGS: Brain: Normal anatomic configuration. No abnormal intra or extra-axial mass lesion or fluid  collection. No abnormal mass effect or midline shift. No evidence of acute intracranial hemorrhage or infarct. Ventricular size is normal. Cerebellum unremarkable. Vascular: Unremarkable Skull: Intact Sinuses/Orbits: Paranasal sinuses are clear. Orbits are unremarkable. Other: Mastoid air cells and middle ear cavities are clear. IMPRESSION: 1. No acute intracranial abnormality. No etiology identified for the patient's headaches. Electronically Signed   By: Helyn Numbers M.D.   On: 01/08/2023 22:52    EKG: I independently viewed the EKG done and my findings are as followed: Marked sinus bradycardia rate of 32, with nonspecific ST-T changes.  QTc 408.  Assessment/Plan Present on Admission:  Bradycardia  Principal Problem:   Bradycardia  Sinus bradycardia, unclear etiology Unclear if related to side effect from home topiramate Topiramate, possible adverse effect of bradycardia in the 1% Will hold off topiramate tonight, last dose was taken prior to presentation to the ED. 1 dose of atropine 0.4 mg ordered to be administered in the ED as recommended by cardiology Atropine as needed for symptomatic bradycardia Close monitoring in stepdown unit. Cardiology consulted by EDP Follow TSH and free T4  Idiopathic intracranial hypertension Recent admission, post LP x 2 Underwent LP on 12/25/2022 and 12/30/2022, LP showed elevated opening pressure, headache improved after removal of CSF As needed analgesics Neurology, Dr. Derry Lory, consulted routinely to assist with the management.  Worsening chronic migraine headache on topiramate Topiramate held tonight due to possible side effect of bradycardia, to avoid worsening bradycardia with heart rate in the low 30s. Neurology and cardiology consulted to assist with the management Analgesics as needed  Chronic anxiety/depression Resume home Prozac  Chronic mild non anion gap metabolic acidosis Serum bicarb 18, anion gap of 8 Obtain VBG to assess  pH  Acute drop in hemoglobin Hemoglobin 10.8 from 13 at baseline Closely monitor H&H Repeat CBC in the morning  Severe morbid obesity BMI 52 Recommend weight loss outpatient with regular physical activity and healthy dieting.   Time: 75 minutes.   DVT prophylaxis: Subcu heparin 3 times daily  Code Status: Full code  Family Communication: Spouse at bedside.  Disposition Plan: Admitted to stepdown unit  Consults called: Neurology, cardiology.  Admission status: Inpatient status.   Status is: Inpatient The patient requires at least 2 midnightS for further evaluation and treatment of present condition.   Darlin Drop MD [facility] Pager [phone]  If 7PM-7AM, please contact night-coverage [email or web address] Password [email or web address]  01/09/2023, 1:22 AM

## 2023-01-09 NOTE — Progress Notes (Signed)
*  PRELIMINARY RESULTS* Echocardiogram 2D Echocardiogram has been performed.  Tabitha Holland 01/09/2023, 2:39 PM

## 2023-01-09 NOTE — Progress Notes (Signed)
Met with pt at bedside, reports safe housing with spouse and children. No TOC intervention indicated.

## 2023-01-09 NOTE — Plan of Care (Signed)
Discussed with patient plan of care for the shift, pain medications and admission questions with some teach back displayed.  What is important to rule out COVID test.   Problem: Education: Goal: Knowledge of General Education information will improve Description: Including pain rating scale, medication(s)/side effects and non-pharmacologic comfort measures Outcome: Progressing   Problem: Health Behavior/Discharge Planning: Goal: Ability to manage health-related needs will improve Outcome: Progressing   Problem: Pain Managment: Goal: General experience of comfort will improve Outcome: Progressing

## 2023-01-09 NOTE — Progress Notes (Signed)
[facility]                                                                               Tabitha Holland, is a 37 y.o. female, DOB - 05/23/1985, BJY:[id number] Admit date - 01/08/2023    Outpatient Primary MD for the patient is [name], MD  LOS - 1  days    Brief summary    37 y.o. female with medical history significant for idiopathic intracranial hypertension (IIH), essential hypertension, chronic migraine, chronic anxiety/depression, severe morbid obesity, recently admitted with worsening headache/photophobia/photophobia, underwent LP on 12/25/2022 and 12/30/2022, LP showed elevated opening pressure, headache improved after removal of CSF, who presented today with complaints of generalized fatigue and worsening headache this afternoon.  She took her home topiramate without improvement.  She was found be severely bradycardic in ED.   The patient was admitted by [facility], hospitalist service, to stepdown unit as inpatient status.   Assessment & Plan    Assessment and Plan:   Sinus bradycardia:  - ? From topiramate vs vagal response from her IIH. - currently asymptomatic.  - cardiology consulted and recommendations given.    Idiopathic intracranial Hypertension:  S/p LP twice in the last one month.  Currently only on diamox, caffeine tab ordered ( not available at [facility]). Neurology consulted and recommended giving a cocktail of IV decadron, IV valproic acid and IV magnesium sulfate.  Please talk to neurology in am if headaches persist.  Called Neurosurgery on call, spoke to PA regarding the shunt since she already had 3 LP's done. Recommended outpatient follow up in office.     Estimated body mass index is 49.26 kg/m as calculated from the following:   Height as of this encounter: 5\' 11"  (1.803 m).   Weight as of this encounter: 160.2 kg.  Code Status: full code.  DVT Prophylaxis:  heparin injection 5,000 Units Start: 01/08/23 2315   Level of Care: Level  of care: Stepdown Family Communication: Updated family at bedside.   Disposition Plan:     Remains inpatient appropriate:  headache, IV decadron.   Procedures:  Echocardiogram.   Consultants:   Cardiology Neurology NS ( curbside)   Antimicrobials:   Anti-infectives (From admission, onward)    None        Medications  Scheduled Meds:  acetaZOLAMIDE  500 mg Oral BID   Chlorhexidine Gluconate Cloth  6 each Topical Daily   docusate sodium  100 mg Oral BID   FLUoxetine  40 mg Oral Daily   heparin  5,000 Units Subcutaneous Q8H   Continuous Infusions:  valproate sodium 500 mg (01/09/23 1839)   PRN Meds:.acetaminophen, atropine, caffeine, diclofenac Sodium, LORazepam, melatonin, mouth rinse, polyethylene glycol, prochlorperazine    Subjective:   Tabitha Holland was seen and examined today.  Feel tired and fatigued. No dizziness while in th ebed or walking.  No chest pain or sob. No nausea or vomiting since last night.   Objective:   Vitals:   01/09/23 1621 01/09/23 1700 01/09/23 1740 01/09/23 1800  BP: (!) 126/38 (!) 109/58  (!) 109/52  Pulse: (!) 42 (!) 42 (!) 56 (!) 35  Resp: 17 (!) 23 15  15  Temp:      TempSrc:      SpO2: 100% 100% 100% 100%  Weight:      Height:        Intake/Output Summary (Last 24 hours) at 01/09/2023 1858 Last data filed at 01/09/2023 1839 Gross per 24 hour  Intake 911.3 ml  Output --  Net 911.3 ml   Filed Weights   01/09/23 0136  Weight: (!) 160.2 kg     Exam General exam: Appears calm and comfortable  Respiratory system: Clear to auscultation. Respiratory effort normal. Cardiovascular system: S1 & S2 heard, bradycardic.  Gastrointestinal system: Abdomen is nondistended, soft and nontender.  Central nervous system: Alert and oriented. No focal neurological deficits. Extremities: Symmetric 5 x 5 power. Skin: No rashes,  Psychiatry:  Mood & affect appropriate.     Data Reviewed:  I have personally reviewed following labs  and imaging studies   CBC Lab Results  Component Value Date   WBC 7.2 01/09/2023   RBC 3.81 (L) 01/09/2023   HGB 10.8 (L) 01/09/2023   HCT 33.8 (L) 01/09/2023   MCV 88.7 01/09/2023   MCH 28.3 01/09/2023   PLT 172 01/09/2023   MCHC 32.0 01/09/2023   RDW 14.5 01/09/2023   LYMPHSABS 2.3 01/08/2023   MONOABS 0.4 01/08/2023   EOSABS 0.1 01/08/2023   BASOSABS 0.0 01/08/2023     Last metabolic panel Lab Results  Component Value Date   NA 138 01/09/2023   K 3.5 01/09/2023   CL 109 01/09/2023   CO2 23 01/09/2023   BUN 8 01/09/2023   CREATININE 0.89 01/09/2023   GLUCOSE 107 (H) 01/09/2023   GFRNONAA >60 01/09/2023   GFRAA >60 10/02/2019   CALCIUM 9.2 01/09/2023   PHOS 3.8 01/09/2023   PROT 6.8 01/08/2023   ALBUMIN 3.3 (L) 01/08/2023   LABGLOB 2.9 08/03/2020   AGRATIO 1.3 08/03/2020   BILITOT 0.6 01/08/2023   ALKPHOS 82 01/08/2023   AST 14 (L) 01/08/2023   ALT 22 01/08/2023   ANIONGAP 6 01/09/2023    CBG (last 3)  No results for input(s): "GLUCAP" in the last 72 hours.    Coagulation Profile: No results for input(s): "INR", "PROTIME" in the last 168 hours.   Radiology Studies: ECHOCARDIOGRAM COMPLETE  Result Date: 01/09/2023    ECHOCARDIOGRAM REPORT   Patient Name:   Tabitha Holland Date of Exam: 01/09/2023 Medical Rec #:  [id number]          Height:       71.0 in Accession #:    [id number]         Weight:       353.2 lb Date of Birth:  09-01-1985          BSA:          2.685 m Patient Age:    37 years           BP:           111/68 mmHg Patient Gender: F                  HR:           34 bpm. Exam Location:  Inpatient Procedure: 2D Echo, Cardiac Doppler and Color Doppler Indications:    Bradycardia; Abnormal ECG R94.31  History:        Patient has prior history of Echocardiogram examinations, most                 recent 01/25/2022.  Arrythmias:Bradycardia; Risk                 Factors:Hypertension and Current Smoker.  Sonographer:    Dondra Prader RVT RCS Referring Phys:  [id number] CAROLE N HALL IMPRESSIONS  1. Left ventricular ejection fraction, by estimation, is 55 to 60%. The left ventricle has normal function. The left ventricle has no regional wall motion abnormalities. There is mild concentric left ventricular hypertrophy. Left ventricular diastolic parameters were normal.  2. Right ventricular systolic function is normal. The right ventricular size is normal. Tricuspid regurgitation signal is inadequate for assessing PA pressure.  3. Left atrial size was mildly dilated.  4. The mitral valve is grossly normal. Trivial mitral valve regurgitation. No evidence of mitral stenosis.  5. The aortic valve is tricuspid. Aortic valve regurgitation is not visualized. No aortic stenosis is present.  6. The inferior vena cava is normal in size with greater than 50% respiratory variability, suggesting right atrial pressure of 3 mmHg. FINDINGS  Left Ventricle: Left ventricular ejection fraction, by estimation, is 55 to 60%. The left ventricle has normal function. The left ventricle has no regional wall motion abnormalities. The left ventricular internal cavity size was normal in size. There is  mild concentric left ventricular hypertrophy. Left ventricular diastolic parameters were normal. Right Ventricle: The right ventricular size is normal. No increase in right ventricular wall thickness. Right ventricular systolic function is normal. Tricuspid regurgitation signal is inadequate for assessing PA pressure. Left Atrium: Left atrial size was mildly dilated. Right Atrium: Right atrial size was normal in size. Pericardium: There is no evidence of pericardial effusion. Mitral Valve: The mitral valve is grossly normal. Trivial mitral valve regurgitation. No evidence of mitral valve stenosis. Tricuspid Valve: The tricuspid valve is grossly normal. Tricuspid valve regurgitation is not demonstrated. No evidence of tricuspid stenosis. Aortic Valve: The aortic valve is tricuspid. Aortic valve  regurgitation is not visualized. No aortic stenosis is present. Aortic valve mean gradient measures 2.0 mmHg. Aortic valve peak gradient measures 4.3 mmHg. Aortic valve area, by VTI measures 4.14 cm. Pulmonic Valve: The pulmonic valve was grossly normal. Pulmonic valve regurgitation is trivial. No evidence of pulmonic stenosis. Aorta: The aortic root is normal in size and structure. Venous: The inferior vena cava is normal in size with greater than 50% respiratory variability, suggesting right atrial pressure of 3 mmHg. IAS/Shunts: The atrial septum is grossly normal.  LEFT VENTRICLE PLAX 2D LVIDd:         4.90 cm   Diastology LVIDs:         3.10 cm   LV e' medial:  8.49 cm/s LV PW:         1.30 cm   LV e' lateral: 11.20 cm/s LV IVS:        1.30 cm LVOT diam:     2.40 cm LV SV:         112 LV SV Index:   42 LVOT Area:     4.52 cm  RIGHT VENTRICLE             IVC RV S prime:     14.60 cm/s  IVC diam: 1.70 cm TAPSE (M-mode): 2.4 cm LEFT ATRIUM             Index        RIGHT ATRIUM           Index LA diam:        4.00 cm 1.49 cm/m   RA Area:  15.70 cm LA Vol (A2C):   96.2 ml 35.83 ml/m  RA Volume:   47.60 ml  17.73 ml/m LA Vol (A4C):   93.3 ml 34.75 ml/m LA Biplane Vol: 95.9 ml 35.72 ml/m  AORTIC VALVE                    PULMONIC VALVE AV Area (Vmax):    4.01 cm     PV Vmax:       0.75 m/s AV Area (Vmean):   3.74 cm     PV Peak grad:  2.3 mmHg AV Area (VTI):     4.14 cm AV Vmax:           104.00 cm/s AV Vmean:          67.300 cm/s AV VTI:            0.271 m AV Peak Grad:      4.3 mmHg AV Mean Grad:      2.0 mmHg LVOT Vmax:         92.10 cm/s LVOT Vmean:        55.700 cm/s LVOT VTI:          0.248 m LVOT/AV VTI ratio: 0.92  AORTA Ao Root diam: 3.20 cm  SHUNTS Systemic VTI:  0.25 m Systemic Diam: 2.40 cm Lennie Odor MD Electronically signed by Lennie Odor MD Signature Date/Time: 01/09/2023/3:28:52 PM    Final    CT Head Wo Contrast  Result Date: 01/08/2023 CLINICAL DATA:  Headache, intracranial  hypertension, bradycardia, fatigue EXAM: CT HEAD WITHOUT CONTRAST TECHNIQUE: Contiguous axial images were obtained from the base of the skull through the vertex without intravenous contrast. RADIATION DOSE REDUCTION: This exam was performed according to the departmental dose-optimization program which includes automated exposure control, adjustment of the mA and/or kV according to patient size and/or use of iterative reconstruction technique. COMPARISON:  None Available. FINDINGS: Brain: Normal anatomic configuration. No abnormal intra or extra-axial mass lesion or fluid collection. No abnormal mass effect or midline shift. No evidence of acute intracranial hemorrhage or infarct. Ventricular size is normal. Cerebellum unremarkable. Vascular: Unremarkable Skull: Intact Sinuses/Orbits: Paranasal sinuses are clear. Orbits are unremarkable. Other: Mastoid air cells and middle ear cavities are clear. IMPRESSION: 1. No acute intracranial abnormality. No etiology identified for the patient's headaches. Electronically Signed   By: Helyn Numbers M.D.   On: 01/08/2023 22:52       Kathlen Mody M.D. [facility] 01/09/2023, 6:58 PM  Available via Epic secure chat 7am-7pm After 7 pm, please refer to night coverage provider listed on amion.

## 2023-01-09 NOTE — ED Notes (Signed)
ED TO INPATIENT HANDOFF REPORT  Name/Age/Gender Tabitha Holland 37 y.o. female  Code Status    Code Status Orders  (From admission, onward)           Start     Ordered   01/08/23 2301  Full code  Continuous       Question:  By:  Answer:  Consent: discussion documented in EHR   01/08/23 2300           Code Status History     Date Active Date Inactive Code Status Order ID Comments User Context   01/02/2023 1544 01/04/2023 1901 Full Code [id number]  Briscoe Deutscher, MD ED   02/09/2021 1102 02/09/2021 2046 Full Code [id number]  Reva Bores, MD Inpatient   10/02/2019 1913 10/03/2019 1542 Full Code [id number]  Alveria Apley, PA-C ED   05/26/2018 0457 05/29/2018 2109 Full Code [id number]  Adam Phenix, MD Inpatient   05/26/2018 0457 05/26/2018 0457 Full Code [id number]  Adam Phenix, MD Inpatient   10/28/2013 1222 10/31/2013 1709 Full Code [id number]  Vale Haven, MD Inpatient   10/28/2013 0713 10/28/2013 1222 Full Code [id number]  Minta Balsam, MD Inpatient   10/27/2013 2354 10/28/2013 0713 Full Code [id number]  Odom, Suszanne Conners, MD Inpatient       Home/SNF/Other Home  Chief Complaint Bradycardia [R00.1]  Level of Care/Admitting Diagnosis ED Disposition     ED Disposition  Admit   Condition  --   Comment  Hospital Area: [facility] [100102]  Level of Care: Stepdown [14]  Admit to SDU based on following criteria: Severe physiological/psychological symptoms:  Any diagnosis requiring assessment & intervention at least every 4 hours on an ongoing basis to obtain desired patient outcomes including stability and rehabilitation  May admit patient to [facility] or [facility] if equivalent level of care is available:: Yes  Covid Evaluation: Asymptomatic - no recent exposure (last 10 days) testing not required  Diagnosis: Bradycardia [191850]  Admitting Physician: Darlin Drop [[id number]]  Attending Physician: Darlin Drop [[id number]]  Certification:: I  certify this patient will need inpatient services for at least 2 midnights  Expected Medical Readiness: 01/10/2023          Medical History Past Medical History:  Diagnosis Date   Anxiety    Depression    Gallstones    History of anemia    per pt on 02/02/2021   History of suicidal ideation 10/02/2019   Hx of wisdom tooth extraction    Hypertension    IIH (idiopathic intracranial hypertension)    Migraines    occasional per pt 02/02/2021   Morbid obesity (HCC)     Allergies Allergies  Allergen Reactions   Butalbital Itching   Codeine Other (See Comments)    Migraines    Fish Allergy Itching   Hydrocodone Other (See Comments)    Migraines    Latex Itching   Percocet [Oxycodone-Acetaminophen] Other (See Comments)    Migraines    Shellfish Allergy Itching    IV Location/Drains/Wounds Patient Lines/Drains/Airways Status     Active Line/Drains/Airways     Name Placement date Placement time Site Days   Peripheral IV 01/08/23 20 G Right Antecubital 01/08/23  2350  Antecubital  1            Labs/Imaging Results for orders placed or performed during the hospital encounter of 01/08/23 (from the past 48 hour(s))  CBC with Differential     Status: Abnormal  Collection Time: 01/08/23  6:23 PM  Result Value Ref Range   WBC 7.2 4.0 - 10.5 K/uL   RBC 3.77 (L) 3.87 - 5.11 MIL/uL   Hemoglobin 10.8 (L) 12.0 - 15.0 g/dL   HCT 96.0 (L) 45.4 - 09.8 %   MCV 88.1 80.0 - 100.0 fL   MCH 28.6 26.0 - 34.0 pg   MCHC 32.5 30.0 - 36.0 g/dL   RDW 11.9 14.7 - 82.9 %   Platelets 168 150 - 400 K/uL   nRBC 0.0 0.0 - 0.2 %   Neutrophils Relative % 61 %   Neutro Abs 4.3 1.7 - 7.7 K/uL   Lymphocytes Relative 31 %   Lymphs Abs 2.3 0.7 - 4.0 K/uL   Monocytes Relative 6 %   Monocytes Absolute 0.4 0.1 - 1.0 K/uL   Eosinophils Relative 2 %   Eosinophils Absolute 0.1 0.0 - 0.5 K/uL   Basophils Relative 0 %   Basophils Absolute 0.0 0.0 - 0.1 K/uL   Immature Granulocytes 0 %   Abs  Immature Granulocytes 0.02 0.00 - 0.07 K/uL    Comment: Performed at [facility], [address] [place], [place] [address]  Comprehensive metabolic panel     Status: Abnormal   Collection Time: 01/08/23  6:23 PM  Result Value Ref Range   Sodium 134 (L) 135 - 145 mmol/L   Potassium 3.6 3.5 - 5.1 mmol/L   Chloride 108 98 - 111 mmol/L   CO2 18 (L) 22 - 32 mmol/L   Glucose, Bld 102 (H) 70 - 99 mg/dL    Comment: Glucose reference range applies only to samples taken after fasting for at least 8 hours.   BUN 7 6 - 20 mg/dL   Creatinine, Ser 0.86 0.44 - 1.00 mg/dL   Calcium 8.7 (L) 8.9 - 10.3 mg/dL   Total Protein 6.8 6.5 - 8.1 g/dL   Albumin 3.3 (L) 3.5 - 5.0 g/dL   AST 14 (L) 15 - 41 U/L   ALT 22 0 - 44 U/L   Alkaline Phosphatase 82 38 - 126 U/L   Total Bilirubin 0.6 0.3 - 1.2 mg/dL   GFR, Estimated >57 >84 mL/min    Comment: (NOTE) Calculated using the CKD-EPI Creatinine Equation (2021)    Anion gap 8 5 - 15    Comment: Performed at [facility], [address], [place], [place] [address]  hCG, quantitative, pregnancy     Status: None   Collection Time: 01/08/23  6:23 PM  Result Value Ref Range   hCG, Beta Chain, Quant, S <1 <5 mIU/mL    Comment:          GEST. AGE      CONC.  (mIU/mL)   <=1 WEEK        5 - 50     2 WEEKS       50 - 500     3 WEEKS       100 - 10,000     4 WEEKS     1,000 - 30,000     5 WEEKS     3,500 - 115,000   6-8 WEEKS     12,000 - 270,000    12 WEEKS     15,000 - 220,000        FEMALE AND NON-PREGNANT FEMALE:     LESS THAN 5 mIU/mL Performed at [facility], [address], [place], [place] [address]   Troponin I (High  Sensitivity)     Status: None   Collection Time: 01/08/23  8:52 PM  Result Value Ref Range   Troponin I (High Sensitivity) 3 <18 ng/L    Comment: (NOTE) Elevated high sensitivity troponin I (hsTnI) values and significant  changes across serial measurements may suggest ACS but  many other  chronic and acute conditions are known to elevate hsTnI results.  Refer to the "Links" section for chest pain algorithms and additional  guidance. Performed at [facility], [address] [place], [place] [address]   Pregnancy, urine     Status: None   Collection Time: 01/08/23  8:57 PM  Result Value Ref Range   Preg Test, Ur NEGATIVE NEGATIVE    Comment:        THE SENSITIVITY OF THIS METHODOLOGY IS >25 mIU/mL. Performed at [facility], [address] [place], [place] [address]    CT Head Wo Contrast  Result Date: 01/08/2023 CLINICAL DATA:  Headache, intracranial hypertension, bradycardia, fatigue EXAM: CT HEAD WITHOUT CONTRAST TECHNIQUE: Contiguous axial images were obtained from the base of the skull through the vertex without intravenous contrast. RADIATION DOSE REDUCTION: This exam was performed according to the departmental dose-optimization program which includes automated exposure control, adjustment of the mA and/or kV according to patient size and/or use of iterative reconstruction technique. COMPARISON:  None Available. FINDINGS: Brain: Normal anatomic configuration. No abnormal intra or extra-axial mass lesion or fluid collection. No abnormal mass effect or midline shift. No evidence of acute intracranial hemorrhage or infarct. Ventricular size is normal. Cerebellum unremarkable. Vascular: Unremarkable Skull: Intact Sinuses/Orbits: Paranasal sinuses are clear. Orbits are unremarkable. Other: Mastoid air cells and middle ear cavities are clear. IMPRESSION: 1. No acute intracranial abnormality. No etiology identified for the patient's headaches. Electronically Signed   By: Helyn Numbers M.D.   On: 01/08/2023 22:52    Pending Labs Unresulted Labs (From admission, onward)     Start     Ordered   01/08/23 2300  CBC  (heparin)  Once,   R       Comments: Baseline for heparin therapy IF NOT ALREADY DRAWN.  Notify MD if PLT < 100 K.     01/08/23 2300   01/08/23 2300  Creatinine, serum  (heparin)  Once,   R       Comments: Baseline for heparin therapy IF NOT ALREADY DRAWN.    01/08/23 2300   01/08/23 2012  SARS Coronavirus 2 by RT PCR (hospital order, performed in [facility] hospital lab) *cepheid single result test* Anterior Nasal Swab  (Tier 2 - SARS Coronavirus 2 by RT PCR (hospital order, performed in [facility] hospital lab) *cepheid single result test*)  Once,   URGENT        01/08/23 2011   01/08/23 1845  TSH  Once,   URGENT        01/08/23 1844   01/08/23 1845  T4, free  Once,   URGENT        01/08/23 1844            Vitals/Pain Today's Vitals   01/08/23 2245 01/08/23 2300 01/08/23 2330 01/09/23 0000  BP: (!) 103/58 (!) 98/54 (!) 101/54   Pulse: (!) 32 (!) 36 (!) 36 (!) 43  Resp: 17 19 18 10   Temp:      TempSrc:      SpO2: 100% 100% 100% 100%  PainSc:        Isolation Precautions Airborne and Contact  precautions  Medications Medications  heparin injection 5,000 Units (5,000 Units Subcutaneous Patient Refused/Not Given 01/08/23 2351)  atropine injection 0.4 mg (0.4 mg Intravenous Given 01/08/23 2350)  diphenhydrAMINE (BENADRYL) injection 25 mg (25 mg Intravenous Given 01/08/23 2351)  prochlorperazine (COMPAZINE) injection 10 mg (10 mg Intravenous Given 01/08/23 2350)    Mobility walks with person assist

## 2023-01-09 NOTE — Consult Note (Signed)
Cardiology Consult:   Patient ID: Tabitha Holland; MRN: [id number]; DOB: 05-14-1985   Admission date: 01/08/2023  Primary Care Provider: [name], MD Primary Cardiologist: New to Dr. Izora Ribas  Consult question/Chief Complaint:  Sinus bradycardia  Patient Profile:   Tabitha Holland is a 37 y.o. female with a history of idiopathy intracranial hypertension, HTN, Super morbid obesity s/p recent LP for elevated pressure and photophobia.  Evaluated for headache and fatigue.  Found to have incidental sinus bradycardia.   History of Present Illness:   Tabitha Holland is feeling tired.   Was last feeling well a few days age. She is normally able to wake up and get to work.  For the past few couple of days, when she wakes up, she does not have any energy.  Her wife is going through treatment for cancer, but that is not new. No chest pain.  No SOB.  No syncope or near syncope.  She notes that she fell asleep in her [employer] due to such profound fatigue.  Wife check heart rate and it was low; it has been low for as long as she can remember.  In ED.  Heart rate was lowest at 32 BPM.  She was given atropine and had palpitations after.  She was brought to ICU.   She is fatigued but otherwise ok.  Patient reports prior cardiac testing including  CT aorta 2023: atrial septal aneurysm with possible PFO otherwise WNL Echo; possible PFO otherwise WNL  Allergies:    Allergies  Allergen Reactions   Butalbital Itching   Codeine Other (See Comments)    Migraines    Fish Allergy Itching   Hydrocodone Other (See Comments)    Migraines    Latex Itching   Percocet [Oxycodone-Acetaminophen] Other (See Comments)    Migraines    Shellfish Allergy Itching    Social History:   Social History   Socioeconomic History   Marital status: Media planner    Spouse name: Not on file   Number of children: 3   Years of education: Not on file   Highest education level: Not on file   Occupational History   Occupation: Artist  Tobacco Use   Smoking status: Some Days    Current packs/day: 0.25    Types: Cigarettes   Smokeless tobacco: Never  Vaping Use   Vaping status: Never Used  Substance and Sexual Activity   Alcohol use: Not Currently    Comment: 2-3 weekly   Drug use: Not Currently    Types: Marijuana   Sexual activity: Not Currently    Birth control/protection: None  Other Topics Concern   Not on file  Social History Narrative   Not on file   Social Determinants of Health   Financial Resource Strain: Not on file  Food Insecurity: No Food Insecurity (01/09/2023)   Hunger Vital Sign    Worried About Running Out of Food in the Last Year: Never true    Ran Out of Food in the Last Year: Never true  Transportation Needs: No Transportation Needs (01/09/2023)   PRAPARE - Administrator, Civil Service (Medical): No    Lack of Transportation (Non-Medical): No  Physical Activity: Not on file  Stress: Not on file  Social Connections: Not on file  Intimate Partner Violence: Not At Risk (01/09/2023)   Humiliation, Afraid, Rape, and Kick questionnaire    Fear of Current or Ex-Partner: No    Emotionally Abused: No  Physically Abused: No    Sexually Abused: No    Family History:   The patient's family history includes Bipolar disorder in her brother; Hypertension in her sister; Schizophrenia in her brother. She was adopted.    ROS:  Please see the history of present illness.   Physical Exam/Data:   Vitals:   01/09/23 0700 01/09/23 0800 01/09/23 0928 01/09/23 0940  BP:  (!) 191/123 120/69   Pulse: (!) 32 (!) 53  68  Resp: 17 11  14   Temp: (!) 97.5 F (36.4 C)     TempSrc: Oral     SpO2: 100% 100%  100%  Weight:      Height:        Intake/Output Summary (Last 24 hours) at 01/09/2023 1007 Last data filed at 01/09/2023 0900 Gross per 24 hour  Intake 360 ml  Output --  Net 360 ml   Filed Weights   01/09/23 0136  Weight:  (!) 160.2 kg   Body mass index is 49.26 kg/m.   Gen: no distress, Morbid obesity   Neck: No JVD  Cardiac: No Rubs or Gallops, distant heart sounds, no murmur, bradycardia, +2 radial pulses Respiratory: Clear to auscultation bilaterally, normal effort, normal  respiratory rate GI: Soft, nontender, non-distended  MS: No  edema;  moves all extremities Integument: Skin feels warm Neuro:  At time of evaluation, alert and oriented to person/place/time/situation  Psych: Normal affect, patient feels well   TELE: sinus bradycardia; no AF, heart rate augments with activity 12/30/21: Sinus bradycardia 6/7/204: SR rate 60 8/24: SR rate 66 9/3: Sinus bradycardia 01/03/23: Marked sinus bradycardia rate 47 bpm 01/08/23: Marked sinus bradycardia rate 32 bpm  Relevant CV Studies:  Cardiac Studies & Procedures       ECHOCARDIOGRAM  ECHOCARDIOGRAM COMPLETE 01/25/2022  Narrative ECHOCARDIOGRAM REPORT    Patient Name:   Tabitha Holland Date of Exam: 01/25/2022 Medical Rec #:  [id number]          Height:       72.0 in Accession #:    [id number]         Weight:       338.0 lb Date of Birth:  15-Apr-1986          BSA:          2.663 m Patient Age:    36 years           BP:           162/106 mmHg Patient Gender: F                  HR:           60 bpm. Exam Location:  Outpatient  Procedure: 2D Echo, Color Doppler and Cardiac Doppler  Indications:    R60.0 Lower extremity edema  History:        Patient has no prior history of Echocardiogram examinations. Risk Factors:Hypertension.  Sonographer:    Irving Burton Senior RDCS Referring Phys: [id number] MARSHALL L CHAMBLISS  IMPRESSIONS   1. Left ventricular ejection fraction, by estimation, is 60 to 65%. The left ventricle has normal function. The left ventricle has no regional wall motion abnormalities. Left ventricular diastolic parameters were normal. 2. Right ventricular systolic function is normal. The right ventricular size is normal. 3. Left atrial  size was mildly dilated. 4. The mitral valve is normal in structure. No evidence of mitral valve regurgitation. No evidence of mitral stenosis. 5. The aortic valve is normal in structure.  Aortic valve regurgitation is not visualized. No aortic stenosis is present. 6. The inferior vena cava is normal in size with greater than 50% respiratory variability, suggesting right atrial pressure of 3 mmHg.  FINDINGS Left Ventricle: Left ventricular ejection fraction, by estimation, is 60 to 65%. The left ventricle has normal function. The left ventricle has no regional wall motion abnormalities. The left ventricular internal cavity size was normal in size. There is borderline concentric left ventricular hypertrophy. Left ventricular diastolic parameters were normal. Normal left ventricular filling pressure.  Right Ventricle: The right ventricular size is normal. No increase in right ventricular wall thickness. Right ventricular systolic function is normal.  Left Atrium: Left atrial size was mildly dilated.  Right Atrium: Right atrial size was normal in size.  Pericardium: There is no evidence of pericardial effusion.  Mitral Valve: The mitral valve is normal in structure. No evidence of mitral valve regurgitation. No evidence of mitral valve stenosis.  Tricuspid Valve: The tricuspid valve is normal in structure. Tricuspid valve regurgitation is not demonstrated. No evidence of tricuspid stenosis.  Aortic Valve: The aortic valve is normal in structure. Aortic valve regurgitation is not visualized. No aortic stenosis is present.  Pulmonic Valve: The pulmonic valve was normal in structure. Pulmonic valve regurgitation is not visualized. No evidence of pulmonic stenosis.  Aorta: The aortic root is normal in size and structure.  Venous: The inferior vena cava is normal in size with greater than 50% respiratory variability, suggesting right atrial pressure of 3 mmHg.  IAS/Shunts: No atrial level shunt  detected by color flow Doppler.   LEFT VENTRICLE PLAX 2D LVIDd:         5.10 cm   Diastology LVIDs:         3.10 cm   LV e' medial:    10.70 cm/s LV PW:         1.20 cm   LV E/e' medial:  9.4 LV IVS:        1.00 cm   LV e' lateral:   11.30 cm/s LVOT diam:     2.50 cm   LV E/e' lateral: 8.9 LV SV:         117 LV SV Index:   44 LVOT Area:     4.91 cm   RIGHT VENTRICLE RV S prime:     12.80 cm/s TAPSE (M-mode): 2.3 cm  LEFT ATRIUM             Index        RIGHT ATRIUM           Index LA diam:        3.80 cm 1.43 cm/m   RA Area:     18.40 cm LA Vol (A2C):   46.0 ml 17.27 ml/m  RA Volume:   54.00 ml  20.28 ml/m LA Vol (A4C):   56.7 ml 21.29 ml/m LA Biplane Vol: 52.9 ml 19.87 ml/m AORTIC VALVE LVOT Vmax:   107.00 cm/s LVOT Vmean:  79.100 cm/s LVOT VTI:    0.239 m  AORTA Ao Root diam: 3.50 cm Ao Asc diam:  3.60 cm  MITRAL VALVE MV Area (PHT): 2.77 cm     SHUNTS MV Decel Time: 274 msec     Systemic VTI:  0.24 m MV E velocity: 101.00 cm/s  Systemic Diam: 2.50 cm MV A velocity: 58.70 cm/s MV E/A ratio:  1.72  Mihai Croitoru MD Electronically signed by Thurmon Fair MD Signature Date/Time: 01/25/2022/4:29:02 PM    Final  Laboratory Data:  Chemistry Recent Labs  Lab 01/08/23 1823 01/09/23 0251  NA 134* 138  K 3.6 3.5  CL 108 109  CO2 18* 23  GLUCOSE 102* 107*  BUN 7 8  CREATININE 0.91 0.89  CALCIUM 8.7* 9.2  GFRNONAA >60 >60  ANIONGAP 8 6    Recent Labs  Lab 01/02/23 1132 01/08/23 1823  PROT 8.0 6.8  ALBUMIN 3.9 3.3*  AST 20 14*  ALT 43 22  ALKPHOS 94 82  BILITOT 0.4 0.6   Hematology Recent Labs  Lab 01/08/23 1823 01/09/23 0251  WBC 7.2 7.2  RBC 3.77* 3.81*  HGB 10.8* 10.8*  HCT 33.2* 33.8*  MCV 88.1 88.7  MCH 28.6 28.3  MCHC 32.5 32.0  RDW 14.6 14.5  PLT 168 172   Cardiac EnzymesNo results for input(s): "TROPONINI" in the last 168 hours. No results for input(s): "TROPIPOC" in the last 168 hours.  BNPNo results for  input(s): "BNP", "PROBNP" in the last 168 hours.  DDimer No results for input(s): "DDIMER" in the last 168 hours.   Assessment and Plan:   Sinus bradycardia - no evidence of conduction delay or heart block - no pacemaker indication - has hx of sinus bradycardia since 2023 - Topiramate can cause this rarely, reasonable to hold if no other cause - no metabolic issues - her thyroid was 6.67 with significant fatigue and low normal T4; will defer treatment of subclinical hypothyroidism to [facility] or endo - if thyroid testing is normal, will get outpatient non live ziopatch at [facility]  Super morbid obesity HTN - K stable on aldactone, can continue  Daytime Somnolence - negative sleep study in 2023  IIH - as per Neurology/Primary      For questions or updates, please contact [facility] Please consult [email or web address] for contact info under Cardiology/STEMI.   Riley Lam, MD FASE [facility] Cardiologist [facility]  [address] [place], [address] [place], [place] [address] [phone]  10:07 AM

## 2023-01-09 NOTE — Progress Notes (Signed)
Late entry note:  Hospitalist team reached out about [name] who follows with outpatient neurology for IIH. She is currently admitted for episode of bradycardia and admitted for cardiology evaluation. Team asked for recommendations for headache. At the time of my discussion with the team, per RN, patient had earlier reported a 6/10 headache but was asleep. We can always try cocktail with small dose of decadron 10mg  IV along with Valproic acid 500mg  IV and Mangesium 2grams IV for headache.  Team had held her topamax for concern for it contributing to bradycardia. Medical management for IIH to lower ICP involves Diamox, lasix and Topamax. We can go up on Diamox gradually upto a maximum of 2grams BID. IF patient eventually fails medical management, options include ONSF or VP shunt.  Given she is established with outpatient neurology and this admission is primarily for cardiac workup and she has no vision deficit except for photophobia, recommend symptomatic headache management with headachje cocktail as above.  Erick Blinks [facility]

## 2023-01-09 NOTE — Progress Notes (Addendum)
Patient given PRN atropine for HR 32 with slight dizziness/fatigue (BP stable at 134/62); follow up HR/BP 66HR and 191/123BP. Patient informed this nurse that after atropine given, patient feels palpitations/discomfort. Paged Dr. Blake Divine to inform of these findings/outcome. Cardiology has already been consulted and should see patient today.   Informed Dr. Blake Divine of critical pO2 from AM VBG <31. Dr. Blake Divine agreed to place patient on [facility].   Per Dr. Blake Divine to keep HR goal of 40 or greater with atropine (verbal to give 0.5 mg atropine PRN as patient does not tolerate atropine well).

## 2023-01-09 NOTE — Progress Notes (Signed)
Discussed the case with neurology, Dr. Derry Lory, via secure chat, recommended the following: If severe headache recurs, consider migraine cocktail: 10mg  of decadron IV, 500mg  of Valproic acid IV and 2gram of Magnesium sulfate IV.   If the patient fails medical management, opthomology will need to do nerve sheath fenestration at some point or neurosurgery will need to do shunt otherwise.   Diamox, lasix, and topamax are our only medical options.  The dose of Diamox can be increased  up to 2000mg  BID if needed.   Highly appreciate neurology's recommendations.  Time: 15 minutes.

## 2023-01-10 DIAGNOSIS — R001 Bradycardia, unspecified: Secondary | ICD-10-CM | POA: Diagnosis not present

## 2023-01-10 DIAGNOSIS — G932 Benign intracranial hypertension: Secondary | ICD-10-CM

## 2023-01-10 MED ORDER — TOPIRAMATE 100 MG PO TABS
100.0000 mg | ORAL_TABLET | Freq: Two times a day (BID) | ORAL | Status: DC
  Administered 2023-01-10 – 2023-01-11 (×3): 100 mg via ORAL
  Filled 2023-01-10 (×3): qty 1

## 2023-01-10 MED ORDER — ACETAZOLAMIDE 250 MG PO TABS
750.0000 mg | ORAL_TABLET | Freq: Two times a day (BID) | ORAL | Status: DC
  Administered 2023-01-10 – 2023-01-11 (×2): 750 mg via ORAL
  Filled 2023-01-10 (×2): qty 3

## 2023-01-10 MED ORDER — DIPHENHYDRAMINE HCL 50 MG/ML IJ SOLN
25.0000 mg | Freq: Once | INTRAMUSCULAR | Status: AC
  Administered 2023-01-10: 25 mg via INTRAVENOUS
  Filled 2023-01-10: qty 1

## 2023-01-10 MED ORDER — METOCLOPRAMIDE HCL 5 MG/ML IJ SOLN
10.0000 mg | Freq: Once | INTRAMUSCULAR | Status: AC
  Administered 2023-01-10: 10 mg via INTRAVENOUS
  Filled 2023-01-10: qty 2

## 2023-01-10 MED ORDER — VALPROATE SODIUM 100 MG/ML IV SOLN
1000.0000 mg | Freq: Once | INTRAVENOUS | Status: AC
  Administered 2023-01-10: 1000 mg via INTRAVENOUS
  Filled 2023-01-10: qty 10

## 2023-01-10 NOTE — Consult Note (Signed)
NEUROLOGY CONSULTATION  Reason for Consult:Headache  CC: Headache   HPI   Tabitha Holland is a 37 y.o. female with a past medical history of idopathic intracranial hypertension, HTN, migraine, anxiety/depression, morbid obesity who was admitted to [facility] with worsening headache, photophobia from 9/3-9/5. Most recent LP done on 9/4 with opening pressure of 21. She returned to the ED on 9/9 and with fatigue and there was concern for symptomatic bradycardia in the ED. She was then was treated with atropine on 9/10 at 1225, 9/10 at 0756 and then 9/11 at 0254 with an increase in her headache after she received the atropine.  She was initially seen by neurology for IIH on 05/17/2022. She underwent an urgent LP and was started on diamox. Topiramate was initially started at 50mg  BID on 05/23/2022 . She was increased on topiramate to 100mg  BID on 10/12/2022.  This was stopped inpatient due to concerns of it causing bradycardia, however on literature review we are not seeing any association between topiramate and bradycardia (it is associated with tachycardia in combination with phentermine when used for weight loss). Their is an association between atropine and headaches. She reports that on 9/10 she felt "fine" until she had the atropine and then her head went from a 6/10 (her baseline/day to day level of pain) to a 9/10 with no relief in position change. She notes that when she goes from lying to sitting she will feel "woozy" for about 1 minute before she is able to ambulate.   After one of her prior LPs she actually had low pressure headache symptoms with worsening of headache on standing and improvement of headache with laying down.  Fortunately this has not been persistent and has resolved  IV decadron, IV valproic acid and IV magnesium sulfate given 01/09/2023 as a migraine cocktail with some short lasting relief.  She reports that she is on a weight loss journey and did lose approximately 45 lbs a few months  ago. She has had trouble maintaining her weightloss as she has been trying to balance this with caring for her children and not feeling well. We had a long discussion on the importance of weight loss in the management of IIH. She agrees that this needs to be a priority for her and is committed to resuming this journey with her family. He additionally tells me that her insurance did not cover the weight loss center which is why she has been trying to do it by herself.   Neurosurgery PA was also consulted by primary team and suggested outpatient follow up regarding  possibility of shunt placement  History is obtained from:Patient and chart review    ROS: Constitutional: positive for nausea Neurological: positive for dizziness and headaches   Past Medical History:  Past Medical History:  Diagnosis Date   Anxiety    Depression    Gallstones    History of anemia    per pt on 02/02/2021   History of suicidal ideation 10/02/2019   Hx of wisdom tooth extraction    Hypertension    IIH (idiopathic intracranial hypertension)    Migraines    occasional per pt 02/02/2021   Morbid obesity (HCC)     Family History Family History  Adopted: Yes  Problem Relation Age of Onset   Hypertension Sister    Bipolar disorder Brother    Schizophrenia Brother     Allergies:  Allergies  Allergen Reactions   Butalbital Itching   Codeine Other (See Comments)  Migraines    Fish Allergy Itching   Hydrocodone Other (See Comments)    Migraines    Latex Itching   Percocet [Oxycodone-Acetaminophen] Other (See Comments)    Migraines    Shellfish Allergy Itching    Social History:   reports that she has been smoking cigarettes. She has never used smokeless tobacco. She reports that she does not currently use alcohol. She reports that she does not currently use drugs after having used the following drugs: Marijuana.    Medications Facility-Administered Medications Prior to Admission  Medication Dose  Route Frequency Provider Last Rate Last Admin   diclofenac Sodium (VOLTAREN) 1 % topical gel 2 g  2 g Topical PRN Bess Kinds, MD       Medications Prior to Admission  Medication Sig Dispense Refill   acetaminophen (TYLENOL) 325 MG tablet Take 650 mg by mouth once as needed for mild pain or moderate pain.     acetaZOLAMIDE (DIAMOX) 250 MG tablet Take 2 tablets (500 mg total) by mouth 2 (two) times daily. 120 tablet 0   caffeine 200 MG TABS tablet Take 1 tablet (200 mg total) by mouth every 4 (four) hours as needed. 30 tablet 0   docusate sodium (COLACE) 100 MG capsule Take 1 capsule (100 mg total) by mouth 2 (two) times daily. 10 capsule 0   FLUoxetine (PROZAC) 40 MG capsule Take 1 capsule (40 mg total) by mouth daily. 30 capsule 0   LORazepam (ATIVAN) 1 MG tablet TAKE 1 TABLET(1 MG) BY MOUTH TWICE DAILY AS NEEDED FOR ANXIETY OR PANIC (Patient taking differently: Take 1 mg by mouth 2 (two) times daily.) 6 tablet 0   methocarbamol (ROBAXIN) 500 MG tablet Take 1 tablet (500 mg total) by mouth every 8 (eight) hours as needed for muscle spasms. 30 tablet 0   Ruxolitinib Phosphate (OPZELURA) 1.5 % CREA Apply 1 Application topically daily as needed (eczema).     spironolactone (ALDACTONE) 50 MG tablet TAKE 1 TABLET(50 MG) BY MOUTH AT BEDTIME (Patient taking differently: Take 50 mg by mouth at bedtime.) 90 tablet 1   topiramate (TOPAMAX) 100 MG tablet Take 1 tablet (100 mg total) by mouth 2 (two) times daily. 60 tablet 6   polyethylene glycol (MIRALAX / GLYCOLAX) 17 g packet Take 17 g by mouth daily. (Patient not taking: Reported on 01/09/2023) 14 each 0    EXAMINATION  Current vital signs: BP 110/77   Pulse (!) 42   Temp 97.9 F (36.6 C) (Oral)   Resp 14   Ht 5\' 11"  (1.803 m)   Wt (!) 160.2 kg   LMP 11/23/2022   SpO2 100%   BMI 49.26 kg/m  Vital signs in last 24 hours: Temp:  [97.8 F (36.6 C)-98.1 F (36.7 C)] 97.9 F (36.6 C) (09/11 1156) Pulse Rate:  [35-56] 42 (09/11  0800) Resp:  [13-23] 14 (09/11 0900) BP: (109-167)/(38-100) 110/77 (09/11 0900) SpO2:  [99 %-100 %] 100 % (09/11 0800)    Examination:  GENERAL: Awake, alert in NAD HEENT: - Normocephalic and atraumatic, dry mm, no lymphadenopathy, no Thyromegally LUNGS - Regular, unlabored respirations CV - S1S2 RRR, equal pulses bilaterally. ABDOMEN - Soft, nontender, nondistended with normoactive BS Ext: warm, well perfused, intact peripheral pulses, no pedal edema Integumentary:  Skin intact on clothed exam  NEURO:  Mental Status: AA&Ox3  Language: speech is clear.  Intact naming, repetition, fluency, and comprehension. Cranial Nerves:  II: PERRL. Visual fields full to confrontation.  III, IV, VI: EOM intact.  Eyelids elevate symmetrically. End gaze nystagmus bilaterally. Blinks to threat.  V: Sensation intact V1-3 symmetrically  VII: no facial asymmetry   VIII: hearing intact to voice IX, X: Palate elevates symmetrically. Phonation is normal.  UJ:[id number] shrug 5/5 and symmetrical  XII: tongue is midline without fasciculations. Motor:  RUE:  5/5    LUE: 5/5 RLE: 5/5 LLE: 5/5 Tone: is normal and bulk is normal Sensation- Intact to light touch bilaterally Coordination: FTN intact bilaterally, no ataxia in BLE., no abnormal movements  Gait- deferred   LABS  I have reviewed labs in epic and the results pertinent to this consultation are:   Lab Results  Component Value Date   LDLCALC 72 08/03/2020   Lab Results  Component Value Date   ALT 22 01/08/2023   AST 14 (L) 01/08/2023   ALKPHOS 82 01/08/2023   BILITOT 0.6 01/08/2023   Lab Results  Component Value Date   HGBA1C 5.2 12/05/2021   Lab Results  Component Value Date   WBC 7.2 01/09/2023   HGB 10.8 (L) 01/09/2023   HCT 33.8 (L) 01/09/2023   MCV 88.7 01/09/2023   PLT 172 01/09/2023   No results found for: "VITAMINB12" No results found for: "FOLATE" Lab Results  Component Value Date   NA 138 01/09/2023   K 3.5  01/09/2023   CL 109 01/09/2023   CO2 23 01/09/2023    DIAGNOSTIC IMAGING/PROCEDURES  I have reviewed the images obtained:, as below    CT-head- No acute intracranial abnormality. No etiology identified for the patient's headaches.  9/4- MRI brain 1. No acute intracranial abnormality. 2. 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  Assessment: 37 y.o. female with a pertinent medical history of idopathic intracranial hypertension, HTN, migraine, anxiety/depression, morbid obesity who was admitted to [facility] with symptomatic bradycardia in the ED on 9/9 she was treated with atropine on 9/10 at 1225, 9/10 at 0756 and then 9/11 at 0254 with an increase in her headache after she received the atropine.  The inpatient exacerbation of her headaches she has experienced appears to be related to atropine exposure temporally.  Her headache at this time does not have features of IIH, she has not been having worsening episodes of vision loss (in fact her episodic vision loss has resolved), she is not having worsening headache with laying flat.  Additionally her last LP demonstrated a normal opening pressure Additionally fortunately she is not having low pressure headache features which she was having previously after her lumbar puncture on 8/26 Given that her dexamethasone dose did not provide much relief and steroid withdrawal after multiple doses can worsen IIH I would avoid further steroid treatment  Discussed with cardiology; they overall have a low concern that Topamax is related to her bradycardia and feel that it is best to restart this medication if it may help with her headaches  Impression: -Provoked headache worsening in the setting of atropine use -Well controlled IIH  -Resolved post-dural headache -Unclear etiology of bradycardia, cardiology concern for vasovagal tone secondary to pain  Recommendations: - Resume  topamax 100mg  BID - Continue Diamox 750 mg BID - Repeat Depakote 1000 mg x 1 dose with 10 mg reglan and 25 mg bendaryl  - Consider addition of phentermine if primary team feels this may be helpful for weight loss as this would be associated with increasing her heart rate; this medication is outside my scope of practice but given association of phentermine plus  Topamax in combination with tachycardia in the literature it may help with weight loss as well as her bradycardia - She may benefit from other outpatient headache management options such as CGRP inhibitors, but defer this to her outpatient neurologist who she is scheduled to see on 9/30; inpatient neurology will sign off at this time but reach out if additional questions or concerns arise - Continue weight loss journey- [facility] consult for resources  --  Seen with NP Armida Sans  Attending Neurologist's note:  I personally saw this patient, gathering history, performing a full neurologic examination, reviewing relevant labs, personally reviewing relevant imaging including MRI brain, and formulated the assessment and plan, adding the note above for completeness and clarity to accurately reflect my thoughts  Brooke Dare MD-PhD [facility] [phone]  Available 7 AM to 7 PM, outside these hours please contact Neurologist on call listed on AMION

## 2023-01-10 NOTE — Progress Notes (Signed)
Progress Note  Patient Name: Tabitha Holland Date of Encounter: 01/10/2023 Primary Cardiologist: None   Subjective   Overnight query of symptomatic bradycardia in late PM. Reviewed at length with RN Freida Busman.   Reviewed telemetry with EP team.  No evidence of heart block. 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 Her HA is markedly worse since receiving this.  She notes she has low heart rates most of her life.  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  Her HA was the thing that kept her from work.   Vital Signs    Vitals:   01/09/23 2000 01/09/23 2100 01/10/23 0044 01/10/23 0427  BP: (!) 118/55 (!) 120/47    Pulse: (!) 36 (!) 40    Resp: 16 17    Temp: 97.8 F (36.6 C)  97.9 F (36.6 C) 97.9 F (36.6 C)  TempSrc: Oral  Oral Oral  SpO2: 100% 99%    Weight:      Height:        Intake/Output Summary (Last 24 hours) at 01/10/2023 0735 Last data filed at 01/09/2023 1839 Gross per 24 hour  Intake 911.3 ml  Output --  Net 911.3 ml   Filed Weights   01/09/23 0136  Weight: (!) 160.2 kg    Physical Exam   GEN: No acute distress.   Neck: No JVD Cardiac: Regularly resting bradycardia, no murmurs, rubs, or gallops.  Respiratory: Clear to auscultation bilaterally. GI: Soft, nontender, non-distended  MS: No edema  Ambulatory Exam: - no tachypnea.  Normal rate.  Appropriate stability.  Able to arise from sitting and return to bed with no issues.  One lab around the unit  Labs  EKG: Marked Sinus Bradycardia rate 40. Normal PR interval Telemetry: Marked sinus bradycardia nocturnally.  Sinus bradycardia at rest with sinus rhythm with exertion   Chemistry Recent Labs  Lab 01/04/23 0516 01/08/23 1823 01/09/23 0251  NA 138 134* 138  K 3.6 3.6 3.5  CL 110 108 109  CO2 20* 18* 23  GLUCOSE 92 102*  107*  BUN 18 7 8   CREATININE 0.90 0.91 0.89  CALCIUM 8.8* 8.7* 9.2  PROT  --  6.8  --   ALBUMIN  --  3.3*  --   AST  --  14*  --   ALT  --  22  --   ALKPHOS  --  82  --   BILITOT  --  0.6  --   GFRNONAA >60 >60 >60  ANIONGAP 8 8 6      Hematology Recent Labs  Lab 01/04/23 0516 01/08/23 1823 01/09/23 0251  WBC 7.0 7.2 7.2  RBC 3.67* 3.77* 3.81*  HGB 10.6* 10.8* 10.8*  HCT 32.0* 33.2* 33.8*  MCV 87.2 88.1 88.7  MCH 28.9 28.6 28.3  MCHC 33.1 32.5 32.0  RDW 14.2 14.6 14.5  PLT 147* 168 172    Cardiac EnzymesNo results for input(s): "TROPONINI" in the last 168 hours. No results for input(s): "TROPIPOC" in the last 168 hours.   BNPNo results for input(s): "BNP", "PROBNP" in the last 168 hours.   DDimer No results for input(s): "DDIMER" in the last 168 hours.   Cardiac Studies   Cardiac Studies & Procedures       ECHOCARDIOGRAM  ECHOCARDIOGRAM COMPLETE 01/09/2023  Narrative ECHOCARDIOGRAM REPORT    Patient Name:  Tabitha Holland Date of Exam: 01/09/2023 Medical Rec #:  [id number]          Height:       71.0 in Accession #:    [id number]         Weight:       353.2 lb Date of Birth:  October 10, 1985          BSA:          2.685 m Patient Age:    37 years           BP:           111/68 mmHg Patient Gender: F                  HR:           34 bpm. Exam Location:  Inpatient  Procedure: 2D Echo, Cardiac Doppler and Color Doppler  Indications:    Bradycardia; Abnormal ECG R94.31  History:        Patient has prior history of Echocardiogram examinations, most recent 01/25/2022. Arrythmias:Bradycardia; Risk Factors:Hypertension and Current Smoker.  Sonographer:    Dondra Prader RVT RCS Referring Phys: [id number] CAROLE N HALL  IMPRESSIONS   1. Left ventricular ejection fraction, by estimation, is 55 to 60%. The left ventricle has normal function. The left ventricle has no regional wall motion abnormalities. There is mild concentric left ventricular hypertrophy. Left  ventricular diastolic parameters were normal. 2. Right ventricular systolic function is normal. The right ventricular size is normal. Tricuspid regurgitation signal is inadequate for assessing PA pressure. 3. Left atrial size was mildly dilated. 4. The mitral valve is grossly normal. Trivial mitral valve regurgitation. No evidence of mitral stenosis. 5. The aortic valve is tricuspid. Aortic valve regurgitation is not visualized. No aortic stenosis is present. 6. The inferior vena cava is normal in size with greater than 50% respiratory variability, suggesting right atrial pressure of 3 mmHg.  FINDINGS Left Ventricle: Left ventricular ejection fraction, by estimation, is 55 to 60%. The left ventricle has normal function. The left ventricle has no regional wall motion abnormalities. The left ventricular internal cavity size was normal in size. There is mild concentric left ventricular hypertrophy. Left ventricular diastolic parameters were normal.  Right Ventricle: The right ventricular size is normal. No increase in right ventricular wall thickness. Right ventricular systolic function is normal. Tricuspid regurgitation signal is inadequate for assessing PA pressure.  Left Atrium: Left atrial size was mildly dilated.  Right Atrium: Right atrial size was normal in size.  Pericardium: There is no evidence of pericardial effusion.  Mitral Valve: The mitral valve is grossly normal. Trivial mitral valve regurgitation. No evidence of mitral valve stenosis.  Tricuspid Valve: The tricuspid valve is grossly normal. Tricuspid valve regurgitation is not demonstrated. No evidence of tricuspid stenosis.  Aortic Valve: The aortic valve is tricuspid. Aortic valve regurgitation is not visualized. No aortic stenosis is present. Aortic valve mean gradient measures 2.0 mmHg. Aortic valve peak gradient measures 4.3 mmHg. Aortic valve area, by VTI measures 4.14 cm.  Pulmonic Valve: The pulmonic valve was  grossly normal. Pulmonic valve regurgitation is trivial. No evidence of pulmonic stenosis.  Aorta: The aortic root is normal in size and structure.  Venous: The inferior vena cava is normal in size with greater than 50% respiratory variability, suggesting right atrial pressure of 3 mmHg.  IAS/Shunts: The atrial septum is grossly normal.   LEFT VENTRICLE PLAX 2D LVIDd:  4.90 cm   Diastology LVIDs:         3.10 cm   LV e' medial:  8.49 cm/s LV PW:         1.30 cm   LV e' lateral: 11.20 cm/s LV IVS:        1.30 cm LVOT diam:     2.40 cm LV SV:         112 LV SV Index:   42 LVOT Area:     4.52 cm   RIGHT VENTRICLE             IVC RV S prime:     14.60 cm/s  IVC diam: 1.70 cm TAPSE (M-mode): 2.4 cm  LEFT ATRIUM             Index        RIGHT ATRIUM           Index LA diam:        4.00 cm 1.49 cm/m   RA Area:     15.70 cm LA Vol (A2C):   96.2 ml 35.83 ml/m  RA Volume:   47.60 ml  17.73 ml/m LA Vol (A4C):   93.3 ml 34.75 ml/m LA Biplane Vol: 95.9 ml 35.72 ml/m AORTIC VALVE                    PULMONIC VALVE AV Area (Vmax):    4.01 cm     PV Vmax:       0.75 m/s AV Area (Vmean):   3.74 cm     PV Peak grad:  2.3 mmHg AV Area (VTI):     4.14 cm AV Vmax:           104.00 cm/s AV Vmean:          67.300 cm/s AV VTI:            0.271 m AV Peak Grad:      4.3 mmHg AV Mean Grad:      2.0 mmHg LVOT Vmax:         92.10 cm/s LVOT Vmean:        55.700 cm/s LVOT VTI:          0.248 m LVOT/AV VTI ratio: 0.92  AORTA Ao Root diam: 3.20 cm   SHUNTS Systemic VTI:  0.25 m Systemic Diam: 2.40 cm  Lennie Odor MD Electronically signed by Lennie Odor MD Signature Date/Time: 01/09/2023/3:28:52 PM    Final                  Assessment & Plan    Sinus bradycardia - without heart block - asymptomatic - complicated by subclinical hypothyroidism and pseudotumor cerebri - no bradycardia on personal ambulation with patient - reviewed with EP team 01/09/23: no  indication for transfer to [facility] for evaluation  - recommend tx for IIH as per primary/neurology - I have Dc'ed atropine from chart  HTN - BP controlled  Super morbid obesity - negative testing for sleep apnea in 2023 - long term weight reduction is recommended  Tobacco Abuse - recommend cessation  At this point following along solely in case heart rate questions arise from providers; can arrange outpatient follow up   For questions or updates, please contact [facility] Please consult [email or web address] for contact info under Cardiology/STEMI.      Riley Lam, MD FASE [facility] Cardiologist [facility]  [address] [place], [address] [place], [place] [address] [phone]  7:35 AM

## 2023-01-10 NOTE — Progress Notes (Signed)
[facility]                                                                               Tabitha Holland, is a 37 y.o. female, DOB - 06/12/1985, WUJ:[id number] Admit date - 01/08/2023    Outpatient Primary MD for the patient is [name], MD  LOS - 2  days    Brief summary    37 y.o. female with medical history significant for idiopathic intracranial hypertension (IIH), essential hypertension, chronic migraine, chronic anxiety/depression, severe morbid obesity, recently admitted with worsening headache/photophobia/photophobia, underwent LP on 12/25/2022 and 12/30/2022, LP showed elevated opening pressure, headache improved after removal of CSF, who presented today with complaints of generalized fatigue and worsening headache this afternoon.  She took her home topiramate without improvement.  She was found be severely bradycardic in ED.   The patient was admitted by [facility], hospitalist service, to stepdown unit as inpatient status.   Assessment & Plan    Assessment and Plan:   Sinus bradycardia:  - ? From topiramate vs vagal response from her IIH. - currently asymptomatic.  - cardiology consulted and recommendations given.  - echocardiogram unremarkable.  - HR does dip down to low 30's, but she remains asymptomatic except for chronic headaches and migraine.    Idiopathic intracranial Hypertension:  S/p LP twice in the last one month.  Currently only on diamox, caffeine tab ordered ( not available at [facility]). Neurology consulted and recommended giving a cocktail of IV decadron, IV valproic acid and IV magnesium sulfate. The cocktail was given yesterday without much effect. Requested neurology consult from Dr Iver Nestle due to persistent symptoms.  Called Neurosurgery on call, spoke to PA regarding the shunt since she already had 3 LP's done. Recommended outpatient follow up in office.    Hypertension Sub optimal and worsened parameters after atropine use.  Resume  home meds.     Anemia of chronic disease:  Hemoglobin of 10 and stable.   Body mass index is 49.26 kg/m. Morbidly obese lady, Would benefit from bariatric surgery referral on discharge.    Estimated body mass index is 49.26 kg/m as calculated from the following:   Height as of this encounter: 5\' 11"  (1.803 m).   Weight as of this encounter: 160.2 kg.  Code Status: full code.  DVT Prophylaxis:  heparin injection 5,000 Units Start: 01/08/23 2315   Level of Care: Level of care: Stepdown Family Communication: Updated family at bedside.   Disposition Plan:     Remains inpatient appropriate:  headache, IV decadron.   Procedures:  Echocardiogram.   Consultants:   Cardiology Neurology NS ( curbside)   Antimicrobials:   Anti-infectives (From admission, onward)    None        Medications  Scheduled Meds:  acetaZOLAMIDE  500 mg Oral BID   Chlorhexidine Gluconate Cloth  6 each Topical Daily   docusate sodium  100 mg Oral BID   FLUoxetine  40 mg Oral Daily   heparin  5,000 Units Subcutaneous Q8H   Continuous Infusions:   PRN Meds:.acetaminophen, caffeine, diclofenac Sodium, LORazepam, melatonin, mouth rinse, polyethylene glycol, prochlorperazine    Subjective:   Tabitha Holland was  seen and examined today.  Still has persistent headache.   Objective:   Vitals:   01/10/23 0700 01/10/23 0751 01/10/23 0800 01/10/23 0900  BP: 133/62  (!) 167/100 110/77  Pulse: (!) 38  (!) 42   Resp: 20  (!) 23 14  Temp:  98.1 F (36.7 C)    TempSrc:  Oral    SpO2: 100%  100%   Weight:      Height:        Intake/Output Summary (Last 24 hours) at 01/10/2023 1123 Last data filed at 01/09/2023 1839 Gross per 24 hour  Intake 551.3 ml  Output --  Net 551.3 ml   Filed Weights   01/09/23 0136  Weight: (!) 160.2 kg     Exam General exam: Appears calm and comfortable  Respiratory system: Clear to auscultation. Respiratory effort normal. Cardiovascular system: S1 &  S2 heard, bradycardic.  Gastrointestinal system: Abdomen is nondistended, soft and nontender. No organomegaly or masses felt. Normal bowel sounds heard. Central nervous system: Alert and oriented. No focal neurological deficits. Extremities: Symmetric 5 x 5 power. Skin: No rashes, lesions or ulcers Psychiatry: Judgement and insight appear normal. Mood & affect appropriate.      Data Reviewed:  I have personally reviewed following labs and imaging studies   CBC Lab Results  Component Value Date   WBC 7.2 01/09/2023   RBC 3.81 (L) 01/09/2023   HGB 10.8 (L) 01/09/2023   HCT 33.8 (L) 01/09/2023   MCV 88.7 01/09/2023   MCH 28.3 01/09/2023   PLT 172 01/09/2023   MCHC 32.0 01/09/2023   RDW 14.5 01/09/2023   LYMPHSABS 2.3 01/08/2023   MONOABS 0.4 01/08/2023   EOSABS 0.1 01/08/2023   BASOSABS 0.0 01/08/2023     Last metabolic panel Lab Results  Component Value Date   NA 138 01/09/2023   K 3.5 01/09/2023   CL 109 01/09/2023   CO2 23 01/09/2023   BUN 8 01/09/2023   CREATININE 0.89 01/09/2023   GLUCOSE 107 (H) 01/09/2023   GFRNONAA >60 01/09/2023   GFRAA >60 10/02/2019   CALCIUM 9.2 01/09/2023   PHOS 3.8 01/09/2023   PROT 6.8 01/08/2023   ALBUMIN 3.3 (L) 01/08/2023   LABGLOB 2.9 08/03/2020   AGRATIO 1.3 08/03/2020   BILITOT 0.6 01/08/2023   ALKPHOS 82 01/08/2023   AST 14 (L) 01/08/2023   ALT 22 01/08/2023   ANIONGAP 6 01/09/2023    CBG (last 3)  No results for input(s): "GLUCAP" in the last 72 hours.    Coagulation Profile: No results for input(s): "INR", "PROTIME" in the last 168 hours.   Radiology Studies: ECHOCARDIOGRAM COMPLETE  Result Date: 01/09/2023    ECHOCARDIOGRAM REPORT   Patient Name:   Tabitha Holland Date of Exam: 01/09/2023 Medical Rec #:  [id number]          Height:       71.0 in Accession #:    [id number]         Weight:       353.2 lb Date of Birth:  [birth year]          BSA:          2.685 m Patient Age:    37 years           BP:            111/68 mmHg Patient Gender: F                  HR:  34 bpm. Exam Location:  Inpatient Procedure: 2D Echo, Cardiac Doppler and Color Doppler Indications:    Bradycardia; Abnormal ECG R94.31  History:        Patient has prior history of Echocardiogram examinations, most                 recent 01/25/2022. Arrythmias:Bradycardia; Risk                 Factors:Hypertension and Current Smoker.  Sonographer:    Dondra Prader RVT RCS Referring Phys: [id number] CAROLE N HALL IMPRESSIONS  1. Left ventricular ejection fraction, by estimation, is 55 to 60%. The left ventricle has normal function. The left ventricle has no regional wall motion abnormalities. There is mild concentric left ventricular hypertrophy. Left ventricular diastolic parameters were normal.  2. Right ventricular systolic function is normal. The right ventricular size is normal. Tricuspid regurgitation signal is inadequate for assessing PA pressure.  3. Left atrial size was mildly dilated.  4. The mitral valve is grossly normal. Trivial mitral valve regurgitation. No evidence of mitral stenosis.  5. The aortic valve is tricuspid. Aortic valve regurgitation is not visualized. No aortic stenosis is present.  6. The inferior vena cava is normal in size with greater than 50% respiratory variability, suggesting right atrial pressure of 3 mmHg. FINDINGS  Left Ventricle: Left ventricular ejection fraction, by estimation, is 55 to 60%. The left ventricle has normal function. The left ventricle has no regional wall motion abnormalities. The left ventricular internal cavity size was normal in size. There is  mild concentric left ventricular hypertrophy. Left ventricular diastolic parameters were normal. Right Ventricle: The right ventricular size is normal. No increase in right ventricular wall thickness. Right ventricular systolic function is normal. Tricuspid regurgitation signal is inadequate for assessing PA pressure. Left Atrium: Left atrial size was mildly  dilated. Right Atrium: Right atrial size was normal in size. Pericardium: There is no evidence of pericardial effusion. Mitral Valve: The mitral valve is grossly normal. Trivial mitral valve regurgitation. No evidence of mitral valve stenosis. Tricuspid Valve: The tricuspid valve is grossly normal. Tricuspid valve regurgitation is not demonstrated. No evidence of tricuspid stenosis. Aortic Valve: The aortic valve is tricuspid. Aortic valve regurgitation is not visualized. No aortic stenosis is present. Aortic valve mean gradient measures 2.0 mmHg. Aortic valve peak gradient measures 4.3 mmHg. Aortic valve area, by VTI measures 4.14 cm. Pulmonic Valve: The pulmonic valve was grossly normal. Pulmonic valve regurgitation is trivial. No evidence of pulmonic stenosis. Aorta: The aortic root is normal in size and structure. Venous: The inferior vena cava is normal in size with greater than 50% respiratory variability, suggesting right atrial pressure of 3 mmHg. IAS/Shunts: The atrial septum is grossly normal.  LEFT VENTRICLE PLAX 2D LVIDd:         4.90 cm   Diastology LVIDs:         3.10 cm   LV e' medial:  8.49 cm/s LV PW:         1.30 cm   LV e' lateral: 11.20 cm/s LV IVS:        1.30 cm LVOT diam:     2.40 cm LV SV:         112 LV SV Index:   42 LVOT Area:     4.52 cm  RIGHT VENTRICLE             IVC RV S prime:     14.60 cm/s  IVC diam: 1.70 cm TAPSE (M-mode):  2.4 cm LEFT ATRIUM             Index        RIGHT ATRIUM           Index LA diam:        4.00 cm 1.49 cm/m   RA Area:     15.70 cm LA Vol (A2C):   96.2 ml 35.83 ml/m  RA Volume:   47.60 ml  17.73 ml/m LA Vol (A4C):   93.3 ml 34.75 ml/m LA Biplane Vol: 95.9 ml 35.72 ml/m  AORTIC VALVE                    PULMONIC VALVE AV Area (Vmax):    4.01 cm     PV Vmax:       0.75 m/s AV Area (Vmean):   3.74 cm     PV Peak grad:  2.3 mmHg AV Area (VTI):     4.14 cm AV Vmax:           104.00 cm/s AV Vmean:          67.300 cm/s AV VTI:            0.271 m AV Peak  Grad:      4.3 mmHg AV Mean Grad:      2.0 mmHg LVOT Vmax:         92.10 cm/s LVOT Vmean:        55.700 cm/s LVOT VTI:          0.248 m LVOT/AV VTI ratio: 0.92  AORTA Ao Root diam: 3.20 cm  SHUNTS Systemic VTI:  0.25 m Systemic Diam: 2.40 cm Lennie Odor MD Electronically signed by Lennie Odor MD Signature Date/Time: 01/09/2023/3:28:52 PM    Final    CT Head Wo Contrast  Result Date: 01/08/2023 CLINICAL DATA:  Headache, intracranial hypertension, bradycardia, fatigue EXAM: CT HEAD WITHOUT CONTRAST TECHNIQUE: Contiguous axial images were obtained from the base of the skull through the vertex without intravenous contrast. RADIATION DOSE REDUCTION: This exam was performed according to the departmental dose-optimization program which includes automated exposure control, adjustment of the mA and/or kV according to patient size and/or use of iterative reconstruction technique. COMPARISON:  None Available. FINDINGS: Brain: Normal anatomic configuration. No abnormal intra or extra-axial mass lesion or fluid collection. No abnormal mass effect or midline shift. No evidence of acute intracranial hemorrhage or infarct. Ventricular size is normal. Cerebellum unremarkable. Vascular: Unremarkable Skull: Intact Sinuses/Orbits: Paranasal sinuses are clear. Orbits are unremarkable. Other: Mastoid air cells and middle ear cavities are clear. IMPRESSION: 1. No acute intracranial abnormality. No etiology identified for the patient's headaches. Electronically Signed   By: Helyn Numbers M.D.   On: 01/08/2023 22:52       Kathlen Mody M.D. [facility] 01/10/2023, 11:23 AM  Available via Epic secure chat 7am-7pm After 7 pm, please refer to night coverage provider listed on amion.

## 2023-01-11 ENCOUNTER — Ambulatory Visit: Payer: Self-pay | Admitting: Student

## 2023-01-11 DIAGNOSIS — R001 Bradycardia, unspecified: Secondary | ICD-10-CM | POA: Diagnosis not present

## 2023-01-11 MED ORDER — ACETAZOLAMIDE 250 MG PO TABS: 750.0000 mg | ORAL_TABLET | Freq: Two times a day (BID) | ORAL | 2 refills | Status: DC

## 2023-01-11 MED ORDER — LEVOTHYROXINE SODIUM 25 MCG PO TABS: 25.0000 ug | ORAL_TABLET | Freq: Every day | ORAL | 2 refills | Status: DC

## 2023-01-11 NOTE — Progress Notes (Signed)
D/C instructions given to patient. Verbalized understanding. Pt stated her ride will not be able to be here until 3 pm. Charge nurse made aware. Will continue to monitor.

## 2023-01-11 NOTE — Discharge Summary (Signed)
Physician Discharge Summary   Patient: Tabitha Holland MRN: [id number] DOB: 06/19/1985  Admit date:     01/08/2023  Discharge date: 01/11/23  Discharge Physician: Tabitha Holland   PCP: Tabitha Amel, MD   Recommendations at discharge:  Follow up with PCP and/or other [facility] physician in next 2-4 weeks with suggested recheck TFTs. Consider patient assistance for weight loss drug initiation as well if at all possible.  Consider outpatient zio patch monitoring if symptoms that could be attributable to sinus bradycardia continue (no intervention currently necessary). Avoid negative chronotropic agents.   Discharge Diagnoses: Principal Problem:   Bradycardia  Hospital Course: 37 y.o. female with medical history significant for idiopathic intracranial hypertension (IIH), essential hypertension, chronic migraine, chronic anxiety/depression, severe morbid obesity, recently admitted with worsening headache/photophobia/photophobia, underwent LP on 12/25/2022 and 12/30/2022, LP showed elevated opening pressure, headache improved after removal of CSF, who presented today with complaints of generalized fatigue and worsening headache this afternoon.  She took her home topiramate without improvement.  She was found be severely bradycardic in ED.    The patient was admitted by [facility], hospitalist service, to stepdown unit as inpatient status. Cardiology was consulted. After discussion between cardiology and EP, no intervention was recommended.   Assessment and Plan: Sinus bradycardia:  - ?From topiramate vs vagal response from her IIH. - currently asymptomatic.  - cardiology consulted and no further work up or Tx recommended. Avoid negative chronotropes. - echocardiogram unremarkable.       Idiopathic intracranial hypertension:  Neurology consulted and recommended giving a cocktail of IV decadron, IV valproic acid and IV magnesium sulfate. The cocktail was given yesterday without much effect.  - Titrating  up on diamox to 750mg  BID from 500mg  BID.  - Continue topiramate (another cause for her NAGMA)  - Called Neurosurgery on call, spoke to PA regarding the shunt since she already had 3 LP's done. Recommended outpatient follow up in office.  - Follow up with Tabitha Holland as planned 9/30. Consider neurosurgery referral as outpatient (as recommended by neurosurgery consultant this admission) for consideration of shunt if symptoms remain refractory.    HTN: No changes to Tx   Mild hypothyroidism: Pt has severe obesity, fatigue, bradycardia (longstanding symptoms) and elevated TSH. Free T4 is low normal.  - Discussed risks and benefits of synthroid. Pt opts to initiate very low dose to check for tolerance. Needs PCP follow up for this.    AOCD: Monitor per PCP.   Morbid obesity: Body mass index is 49.26 kg/m.  - Consider ozempic vs. mounjaro vs. wegovy, etc.  - States insurance won't cover bariatric surgery  Consultants: Neurology, Neurosurgery Procedures performed: None  Disposition: Home Diet recommendation:  Cardiac diet DISCHARGE MEDICATION: Allergies as of 01/11/2023       Reactions   Butalbital Itching   Codeine Other (See Comments)   Migraines    Fish Allergy Itching   Hydrocodone Other (See Comments)   Migraines    Latex Itching   Percocet [oxycodone-acetaminophen] Other (See Comments)   Migraines    Shellfish Allergy Itching        Medication List     TAKE these medications    acetaminophen 325 MG tablet Commonly known as: TYLENOL Take 650 mg by mouth once as needed for mild pain or moderate pain.   acetaZOLAMIDE 250 MG tablet Commonly known as: DIAMOX Take 3 tablets (750 mg total) by mouth 2 (two) times daily. What changed: how much to take   caffeine 200  MG Tabs tablet Take 1 tablet (200 mg total) by mouth every 4 (four) hours as needed.   docusate sodium 100 MG capsule Commonly known as: Colace Take 1 capsule (100 mg total) by mouth 2 (two) times daily.    FLUoxetine 40 MG capsule Commonly known as: PROZAC Take 1 capsule (40 mg total) by mouth daily.   levothyroxine 25 MCG tablet Commonly known as: Synthroid Take 1 tablet (25 mcg total) by mouth daily before breakfast.   LORazepam 1 MG tablet Commonly known as: ATIVAN TAKE 1 TABLET(1 MG) BY MOUTH TWICE DAILY AS NEEDED FOR ANXIETY OR PANIC What changed: See the new instructions.   methocarbamol 500 MG tablet Commonly known as: ROBAXIN Take 1 tablet (500 mg total) by mouth every 8 (eight) hours as needed for muscle spasms.   Opzelura 1.5 % Crea Generic drug: Ruxolitinib Phosphate Apply 1 Application topically daily as needed (eczema).   polyethylene glycol 17 g packet Commonly known as: MIRALAX / GLYCOLAX Take 17 g by mouth daily.   spironolactone 50 MG tablet Commonly known as: ALDACTONE TAKE 1 TABLET(50 MG) BY MOUTH AT BEDTIME What changed: See the new instructions.   topiramate 100 MG tablet Commonly known as: Topamax Take 1 tablet (100 mg total) by mouth 2 (two) times daily.        Follow-up Information     Tabitha Amel, MD. Schedule an appointment as soon as possible for a visit.   Specialty: Family Medicine Contact information: [address] [place] [address] [phone]         Tabitha Norfolk, MD Follow up.   Specialty: Neurology Contact information: [address] [place] [address] [phone]                Discharge Exam: Ceasar Mons Weights   01/09/23 0136  Weight: (!) 160.2 kg  BP 136/86 (BP Location: Right Wrist)   Pulse (!) 42   Temp 97.9 F (36.6 C) (Oral)   Resp 20   Ht 5\' 11"  (1.803 m)   Wt (!) 160.2 kg   LMP 11/23/2022   SpO2 100%   BMI 49.26 kg/m   No distress, morbidly obese, pleasant reporting improvement in HA.  Clear, nonlabored Regular bradycardia, no MRG. No pitting.   Condition at discharge: stable  The results of significant diagnostics from this hospitalization (including imaging,  microbiology, ancillary and laboratory) are listed below for reference.   Imaging Studies: ECHOCARDIOGRAM COMPLETE  Result Date: 01/09/2023    ECHOCARDIOGRAM REPORT   Patient Name:   Tabitha Holland Date of Exam: 01/09/2023 Medical Rec #:  [id number]          Height:       71.0 in Accession #:    [id number]         Weight:       353.2 lb Date of Birth:  [birth year]          BSA:          2.685 m Patient Age:    37 years           BP:           111/68 mmHg Patient Gender: F                  HR:           34 bpm. Exam Location:  Inpatient Procedure: 2D Echo, Cardiac Doppler and Color Doppler Indications:    Bradycardia; Abnormal ECG R94.31  History:        Patient has prior history of Echocardiogram examinations, most                 recent 01/25/2022. Arrythmias:Bradycardia; Risk                 Factors:Hypertension and Current Smoker.  Sonographer:    Dondra Prader RVT RCS Referring Phys: [id number] CAROLE N HALL IMPRESSIONS  1. Left ventricular ejection fraction, by estimation, is 55 to 60%. The left ventricle has normal function. The left ventricle has no regional wall motion abnormalities. There is mild concentric left ventricular hypertrophy. Left ventricular diastolic parameters were normal.  2. Right ventricular systolic function is normal. The right ventricular size is normal. Tricuspid regurgitation signal is inadequate for assessing PA pressure.  3. Left atrial size was mildly dilated.  4. The mitral valve is grossly normal. Trivial mitral valve regurgitation. No evidence of mitral stenosis.  5. The aortic valve is tricuspid. Aortic valve regurgitation is not visualized. No aortic stenosis is present.  6. The inferior vena cava is normal in size with greater than 50% respiratory variability, suggesting right atrial pressure of 3 mmHg. FINDINGS  Left Ventricle: Left ventricular ejection fraction, by estimation, is 55 to 60%. The left ventricle has normal function. The left ventricle has no regional wall  motion abnormalities. The left ventricular internal cavity size was normal in size. There is  mild concentric left ventricular hypertrophy. Left ventricular diastolic parameters were normal. Right Ventricle: The right ventricular size is normal. No increase in right ventricular wall thickness. Right ventricular systolic function is normal. Tricuspid regurgitation signal is inadequate for assessing PA pressure. Left Atrium: Left atrial size was mildly dilated. Right Atrium: Right atrial size was normal in size. Pericardium: There is no evidence of pericardial effusion. Mitral Valve: The mitral valve is grossly normal. Trivial mitral valve regurgitation. No evidence of mitral valve stenosis. Tricuspid Valve: The tricuspid valve is grossly normal. Tricuspid valve regurgitation is not demonstrated. No evidence of tricuspid stenosis. Aortic Valve: The aortic valve is tricuspid. Aortic valve regurgitation is not visualized. No aortic stenosis is present. Aortic valve mean gradient measures 2.0 mmHg. Aortic valve peak gradient measures 4.3 mmHg. Aortic valve area, by VTI measures 4.14 cm. Pulmonic Valve: The pulmonic valve was grossly normal. Pulmonic valve regurgitation is trivial. No evidence of pulmonic stenosis. Aorta: The aortic root is normal in size and structure. Venous: The inferior vena cava is normal in size with greater than 50% respiratory variability, suggesting right atrial pressure of 3 mmHg. IAS/Shunts: The atrial septum is grossly normal.  LEFT VENTRICLE PLAX 2D LVIDd:         4.90 cm   Diastology LVIDs:         3.10 cm   LV e' medial:  8.49 cm/s LV PW:         1.30 cm   LV e' lateral: 11.20 cm/s LV IVS:        1.30 cm LVOT diam:     2.40 cm LV SV:         112 LV SV Index:   42 LVOT Area:     4.52 cm  RIGHT VENTRICLE             IVC RV S prime:     14.60 cm/s  IVC diam: 1.70 cm TAPSE (M-mode): 2.4 cm LEFT ATRIUM             Index  RIGHT ATRIUM           Index LA diam:        4.00 cm 1.49 cm/m    RA Area:     15.70 cm LA Vol (A2C):   96.2 ml 35.83 ml/m  RA Volume:   47.60 ml  17.73 ml/m LA Vol (A4C):   93.3 ml 34.75 ml/m LA Biplane Vol: 95.9 ml 35.72 ml/m  AORTIC VALVE                    PULMONIC VALVE AV Area (Vmax):    4.01 cm     PV Vmax:       0.75 m/s AV Area (Vmean):   3.74 cm     PV Peak grad:  2.3 mmHg AV Area (VTI):     4.14 cm AV Vmax:           104.00 cm/s AV Vmean:          67.300 cm/s AV VTI:            0.271 m AV Peak Grad:      4.3 mmHg AV Mean Grad:      2.0 mmHg LVOT Vmax:         92.10 cm/s LVOT Vmean:        55.700 cm/s LVOT VTI:          0.248 m LVOT/AV VTI ratio: 0.92  AORTA Ao Root diam: 3.20 cm  SHUNTS Systemic VTI:  0.25 m Systemic Diam: 2.40 cm Lennie Odor MD Electronically signed by Lennie Odor MD Signature Date/Time: 01/09/2023/3:28:52 PM    Final    CT Head Wo Contrast  Result Date: 01/08/2023 CLINICAL DATA:  Headache, intracranial hypertension, bradycardia, fatigue EXAM: CT HEAD WITHOUT CONTRAST TECHNIQUE: Contiguous axial images were obtained from the base of the skull through the vertex without intravenous contrast. RADIATION DOSE REDUCTION: This exam was performed according to the departmental dose-optimization program which includes automated exposure control, adjustment of the mA and/or kV according to patient size and/or use of iterative reconstruction technique. COMPARISON:  None Available. FINDINGS: Brain: Normal anatomic configuration. No abnormal intra or extra-axial mass lesion or fluid collection. No abnormal mass effect or midline shift. No evidence of acute intracranial hemorrhage or infarct. Ventricular size is normal. Cerebellum unremarkable. Vascular: Unremarkable Skull: Intact Sinuses/Orbits: Paranasal sinuses are clear. Orbits are unremarkable. Other: Mastoid air cells and middle ear cavities are clear. IMPRESSION: 1. No acute intracranial abnormality. No etiology identified for the patient's headaches. Electronically Signed   By: Helyn Numbers  M.D.   On: 01/08/2023 22:52   DG FL GUIDED LUMBAR PUNCTURE  Result Date: 01/03/2023 CLINICAL DATA:  Provided history: Intracranial hypertension. Suspected infectious meningitis. EXAM: LUMBAR PUNCTURE UNDER FLUOROSCOPIC GUIDANCE FLUOROSCOPY: Radiation Exposure Index (as provided by the fluoroscopic device): 38.8 mGy Kerma PROCEDURE: Informed consent was obtained from the patient prior to the procedure. This process included a discussion of procedure risks, benefits and alternatives. Potential risks of the procedure discussed included but were not limited to bleeding, infection, spinal cord/nerve root damage, headache and CSF leak. The patient was positioned prone on the fluoroscopy table. An appropriate skin entry site was determined under fluoroscopy and marked. A time-out was performed. The operator donned sterile gloves and a mask. The lower back was prepped and draped in the usual sterile fashion. Local anesthesia was provided with 1% lidocaine. Under intermittent fluoroscopy, lumbar puncture was performed at the L4-L5 level using a 6 inch 20 gauge  spinal needle with return of clear/colorless CSF and an opening pressure of 21 cm water. 11 mL of CSF were collected and sent for laboratory studies. The inner stylet was replaced within the needle and the needle was removed in its entirety. A dressing was applied at the skin entry site. The patient tolerated the procedure well and no immediate post-procedure complication was apparent. IMPRESSION: 1. Technically successful fluoroscopically-guided L4-L5 lumbar puncture. 2. Opening pressure: 21 cm water. 3. 11 mL of CSF collected and sent for laboratory studies. Electronically Signed   By: Jackey Loge D.O.   On: 01/03/2023 16:31   MR BRAIN WO CONTRAST  Result Date: 01/03/2023 CLINICAL DATA:  Neuro deficit, acute, stroke suspected. Headache, numbness/tingling in the arms and face, ear pressure, and tinnitus. History of idiopathic intracranial hypertension. EXAM:  MRI HEAD WITHOUT CONTRAST TECHNIQUE: Multiplanar, multiecho pulse sequences of the brain and surrounding structures were obtained without intravenous contrast. COMPARISON:  Head MRI and MRV 12/27/2022 FINDINGS: Brain: There is no evidence of an acute infarct, intracranial hemorrhage, mass, midline shift, or extra-axial fluid collection. A partially empty sella and right greater than left Meckel's cave enlargement are again noted. The cerebellar tonsils are normally positioned. The brain is normal in signal. The ventricles are normal in size. Vascular: Major intracranial vascular flow voids are preserved. Skull and upper cervical spine: Unremarkable bone marrow signal. Sinuses/Orbits: Unremarkable orbits. Paranasal sinuses and mastoid air cells are clear. Other: None. IMPRESSION: 1. No acute intracranial abnormality. 2. 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 Electronically Signed   By: Sebastian Ache M.D.   On: 01/03/2023 16:15   MR Lumbar Spine W Wo Contrast  Result Date: 01/02/2023 CLINICAL DATA:  Low back pain, cauda equina syndrome suspected. EXAM: MRI LUMBAR SPINE WITHOUT AND WITH CONTRAST TECHNIQUE: Multiplanar and multiecho pulse sequences of the lumbar spine were obtained without and with intravenous contrast. CONTRAST:  10mL GADAVIST GADOBUTROL 1 MMOL/ML IV SOLN COMPARISON:  None Available. FINDINGS: Segmentation:  Standard. Alignment:  Physiologic. Vertebrae:  No fracture, evidence of discitis, or bone lesion. Conus medullaris and cauda equina: Conus extends to the L1 level. Conus and cauda equina appear normal. No abnormal contrast enhancement identified. Paraspinal and other soft tissues: Negative. Disc levels: T12-L1: No spinal canal or neural foraminal stenosis. L1-2: No spinal canal or neural foraminal stenosis. L2-3: Tiny left foraminal/far lateral disc protrusion and mild facet degenerative changes. No significant spinal  canal or neural foraminal stenosis. L3-4: Mild-to-moderate bilateral facet degenerative changes, left greater than right. No significant spinal canal or neural foraminal stenosis. L4-5: Minimal disc bulge. Moderate hypertrophic right facet degenerative changes with mild periarticular soft tissue edema. Mild left facet degenerative changes. No significant spinal canal or neural foraminal stenosis. L5-S1: Mild facet degenerative changes. No spinal canal or neural foraminal stenosis. IMPRESSION: 1. Degenerative changes of the lumbar spine, more pronounced at the level of the facet joints, particularly at L4-5 where there is mild periarticular soft tissue edema on the right. 2. No high-grade spinal canal or neural foraminal stenosis at any level. Electronically Signed   By: Baldemar Lenis M.D.   On: 01/02/2023 13:47   MR BRAIN WO CONTRAST  Result Date: 12/27/2022 CLINICAL DATA:  Headache, neuro deficit; Headache, intracranial hypertension features EXAM: MRI HEAD WITHOUT CONTRAST MRV HEAD WITHOUT AND WITH CONTRAST TECHNIQUE: Multiplanar, multi-echo pulse sequences of the brain and surrounding structures were acquired without intravenous contrast. Angiographic images of the intracranial venous  structures were acquired using MRV technique with and without intravenous contrast. CONTRAST:  10 mL Gadavist. COMPARISON:  CT head 12/25/2022. FINDINGS: MRI HEAD WITHOUT CONTRAST Brain: No acute infarction, hemorrhage, hydrocephalus, extra-axial collection or mass lesion. Partially empty sella. Mildly prominent Meckel's caves. Vascular: Major arterial flow voids are maintained skull base. Skull and upper cervical spine: Normal marrow signal. Sinuses/Orbits: No acute or significant finding. MR VENOGRAM WITHOUT CONTRAST No evidence of dural venous sinus thrombosis. Specifically, the superior sagittal, straight, sigmoid, transverse and visualized deep cerebral veins are patent. The left transverse sinus is narrowed.  IMPRESSION: 1. No evidence of acute intracranial abnormality. 2. Partially empty sella, narrowed left transverse sinus and mildly prominent Meckel's caves, which is nonspecific but can be seen with idiopathic intracranial hypertension. 3. No evidence of dural venous sinus thrombosis. Electronically Signed   By: Feliberto Harts M.D.   On: 12/27/2022 17:28   MR MRV HEAD W WO CONTRAST  Result Date: 12/27/2022 CLINICAL DATA:  Headache, neuro deficit; Headache, intracranial hypertension features EXAM: MRI HEAD WITHOUT CONTRAST MRV HEAD WITHOUT AND WITH CONTRAST TECHNIQUE: Multiplanar, multi-echo pulse sequences of the brain and surrounding structures were acquired without intravenous contrast. Angiographic images of the intracranial venous structures were acquired using MRV technique with and without intravenous contrast. CONTRAST:  10 mL Gadavist. COMPARISON:  CT head 12/25/2022. FINDINGS: MRI HEAD WITHOUT CONTRAST Brain: No acute infarction, hemorrhage, hydrocephalus, extra-axial collection or mass lesion. Partially empty sella. Mildly prominent Meckel's caves. Vascular: Major arterial flow voids are maintained skull base. Skull and upper cervical spine: Normal marrow signal. Sinuses/Orbits: No acute or significant finding. MR VENOGRAM WITHOUT CONTRAST No evidence of dural venous sinus thrombosis. Specifically, the superior sagittal, straight, sigmoid, transverse and visualized deep cerebral veins are patent. The left transverse sinus is narrowed. IMPRESSION: 1. No evidence of acute intracranial abnormality. 2. Partially empty sella, narrowed left transverse sinus and mildly prominent Meckel's caves, which is nonspecific but can be seen with idiopathic intracranial hypertension. 3. No evidence of dural venous sinus thrombosis. Electronically Signed   By: Feliberto Harts M.D.   On: 12/27/2022 17:28   DG Lumbar Puncture Fluoro Guide  Result Date: 12/25/2022 CLINICAL DATA:  Idiopathic intracranial  hypertension. EXAM: DIAGNOSTIC LUMBAR PUNCTURE UNDER FLUOROSCOPIC GUIDANCE COMPARISON:  None Available. FLUOROSCOPY: Radiation Exposure Index (as provided by the fluoroscopic device): 10.9 mGy Kerma PROCEDURE: Informed consent was obtained from the patient prior to the procedure, including potential complications of headache, allergy, and pain. With the patient prone, the lower back was prepped with Betadine. 1% Lidocaine was used for local anesthesia. Lumbar puncture was performed at the L3-4 level using a 20 gauge needle with return of clear CSF. 17.25 ml of CSF were obtained for laboratory studies. The patient tolerated the procedure well and there were no apparent complications. IMPRESSION: Successful lumbar puncture under fluoroscopy with withdrawal of 17.25 cc clear CSF. Electronically Signed   By: Leanna Battles M.D.   On: 12/25/2022 14:00   CT Head Wo Contrast  Result Date: 12/25/2022 CLINICAL DATA:  Provided history: Headache, increasing frequency or severity. EXAM: CT HEAD WITHOUT CONTRAST TECHNIQUE: Contiguous axial images were obtained from the base of the skull through the vertex without intravenous contrast. RADIATION DOSE REDUCTION: This exam was performed according to the departmental dose-optimization program which includes automated exposure control, adjustment of the mA and/or kV according to patient size and/or use of iterative reconstruction technique. COMPARISON:  Head CT 05/15/2022. FINDINGS: Brain: Cerebral volume is normal. Partially empty sella turcica. There is  no acute intracranial hemorrhage. No demarcated cortical infarct. No extra-axial fluid collection. No evidence of an intracranial mass. No midline shift. Vascular: No hyperdense vessel. Skull: No calvarial fracture or aggressive osseous lesion. Sinuses/Orbits: No orbital mass or acute orbital finding. No significant paranasal sinus disease at the imaged levels. IMPRESSION: 1. No evidence of an acute intracranial abnormality. 2.  Partially empty sella turcica. This finding can reflect incidental anatomic variation, or alternatively, it can be associated with idiopathic intracranial hypertension (pseudotumor cerebri). Electronically Signed   By: Jackey Loge D.O.   On: 12/25/2022 09:06    Microbiology: Results for orders placed or performed during the hospital encounter of 01/08/23  SARS Coronavirus 2 by RT PCR (hospital order, performed in [facility] hospital lab) *cepheid single result test* Anterior Nasal Swab     Status: None   Collection Time: 01/09/23  1:35 AM   Specimen: Anterior Nasal Swab  Result Value Ref Range Status   SARS Coronavirus 2 by RT PCR NEGATIVE NEGATIVE Final    Comment: (NOTE) SARS-CoV-2 target nucleic acids are NOT DETECTED.  The SARS-CoV-2 RNA is generally detectable in upper and lower respiratory specimens during the acute phase of infection. The lowest concentration of SARS-CoV-2 viral copies this assay can detect is 250 copies / mL. A negative result does not preclude SARS-CoV-2 infection and should not be used as the sole basis for treatment or other patient management decisions.  A negative result may occur with improper specimen collection / handling, submission of specimen other than nasopharyngeal swab, presence of viral mutation(s) within the areas targeted by this assay, and inadequate number of viral copies (<250 copies / mL). A negative result must be combined with clinical observations, patient history, and epidemiological information.  Fact Sheet for Patients:   RoadLapTop.co.za  Fact Sheet for Healthcare Providers: http://kim-miller.com/  This test is not yet approved or  cleared by the [place] FDA and has been authorized for detection and/or diagnosis of SARS-CoV-2 by FDA under an Emergency Use Authorization (EUA).  This EUA will remain in effect (meaning this test can be used) for the duration of the COVID-19  declaration under Section 564(b)(1) of the Act, 21 U.S.C. section 360bbb-3(b)(1), unless the authorization is terminated or revoked sooner.  Performed at [facility], [address] [place], [place] [address]   MRSA Next Gen by PCR, Nasal     Status: None   Collection Time: 01/09/23  1:36 AM   Specimen: Nasal Mucosa; Nasal Swab  Result Value Ref Range Status   MRSA by PCR Next Gen NOT DETECTED NOT DETECTED Final    Comment: (NOTE) The GeneXpert MRSA Assay (FDA approved for NASAL specimens only), is one component of a comprehensive MRSA colonization surveillance program. It is not intended to diagnose MRSA infection nor to guide or monitor treatment for MRSA infections. Test performance is not FDA approved in patients less than 24 years old. Performed at [facility] [facility], [address], [place], [place] [address]     Labs: CBC: Recent Labs  Lab 01/08/23 1823 01/09/23 0251  WBC 7.2 7.2  NEUTROABS 4.3  --   HGB 10.8* 10.8*  HCT 33.2* 33.8*  MCV 88.1 88.7  PLT 168 172   Basic Metabolic Panel: Recent Labs  Lab 01/08/23 1823 01/09/23 0251  NA 134* 138  K 3.6 3.5  CL 108 109  CO2 18* 23  GLUCOSE 102* 107*  BUN 7 8  CREATININE 0.91 0.89  CALCIUM 8.7* 9.2  MG  --  2.2  PHOS  --  3.8   Liver Function Tests: Recent Labs  Lab 01/08/23 1823  AST 14*  ALT 22  ALKPHOS 82  BILITOT 0.6  PROT 6.8  ALBUMIN 3.3*   CBG: No results for input(s): "GLUCAP" in the last 168 hours.  Discharge time spent: greater than 30 minutes.  Signed: Tyrone Nine, MD [facility] 01/11/2023

## 2023-01-12 ENCOUNTER — Telehealth: Payer: Self-pay

## 2023-01-12 NOTE — Transitions of Care (Post Inpatient/ED Visit) (Signed)
01/12/2023  Name: Tabitha Holland MRN: [id number] DOB: 1986-03-25  Today's TOC FU Call Status: Today's TOC FU Call Status:: Successful TOC FU Call Completed TOC FU Call Complete Date: 01/12/23 Patient's Name and Date of Birth confirmed.  Transition Care Management Follow-up Telephone Call Date of Discharge: 01/11/23 Discharge Facility: [facility]) Type of Discharge: Inpatient Admission Primary Inpatient Discharge Diagnosis:: bradycardia How have you been since you were released from the hospital?: Better Any questions or concerns?: No  Items Reviewed: Did you receive and understand the discharge instructions provided?: Yes Any new allergies since your discharge?: No Dietary orders reviewed?: Yes Do you have support at home?: Yes People in Home: spouse  Medications Reviewed Today: Medications Reviewed Today     Reviewed by Karena Addison, LPN (Licensed Practical Nurse) on 01/12/23 at 0900  Med List Status: <None>   Medication Order Taking? Sig Documenting Provider Last Dose Status Informant  acetaminophen (TYLENOL) 325 MG tablet [id number] No Take 650 mg by mouth once as needed for mild pain or moderate pain. [provider] Past Week Active Self, Pharmacy Records  acetaZOLAMIDE (DIAMOX) 250 MG tablet [id number]  Take 3 tablets (750 mg total) by mouth 2 (two) times daily. Tyrone Nine, MD  Active   caffeine 200 MG TABS tablet [id number] No Take 1 tablet (200 mg total) by mouth every 4 (four) hours as needed. Rolly Salter, MD Past Week Active Self, Pharmacy Records  diclofenac Sodium (VOLTAREN) 1 % topical gel 2 g [id number]   Bess Kinds, MD  Active   docusate sodium (COLACE) 100 MG capsule [id number] No Take 1 capsule (100 mg total) by mouth 2 (two) times daily. Rolly Salter, MD 01/07/2023 Active Self, Pharmacy Records  FLUoxetine (PROZAC) 40 MG capsule [id number] No Take 1 capsule (40 mg total) by mouth daily. Rolly Salter, MD 01/08/2023 Active Self,  Pharmacy Records  levothyroxine (SYNTHROID) 25 MCG tablet [id number]  Take 1 tablet (25 mcg total) by mouth daily before breakfast. Tyrone Nine, MD  Active   LORazepam (ATIVAN) 1 MG tablet [id number] No TAKE 1 TABLET(1 MG) BY MOUTH TWICE DAILY AS NEEDED FOR ANXIETY OR PANIC  Patient taking differently: Take 1 mg by mouth 2 (two) times daily.   [name], MD 01/08/2023 Active Self, Pharmacy Records  methocarbamol (ROBAXIN) 500 MG tablet [id number] No Take 1 tablet (500 mg total) by mouth every 8 (eight) hours as needed for muscle spasms. Rolly Salter, MD 01/07/2023 Active Self, Pharmacy Records  polyethylene glycol (MIRALAX / GLYCOLAX) 17 g packet [id number] No Take 17 g by mouth daily.  Patient not taking: Reported on 01/09/2023   Rolly Salter, MD Not Taking Active Self, Pharmacy Records  Ruxolitinib Phosphate [facility]) 1.5 % CREA [id number] No Apply 1 Application topically daily as needed (eczema). [provider] Past Month Active Self, Pharmacy Records           Med Note Randa Evens, [facility] D   Tue Jan 09, 2023  8:17 AM)    spironolactone (ALDACTONE) 50 MG tablet [id number] No TAKE 1 TABLET(50 MG) BY MOUTH AT BEDTIME  Patient taking differently: Take 50 mg by mouth at bedtime.   [name], MD unknown Active Self, Pharmacy Records           Med Note Randa Evens, [facility] D   Tue Jan 09, 2023  8:18 AM) ON HOLD PER MD  topiramate (TOPAMAX) 100 MG tablet [id number] No Take 1 tablet (100 mg total) by mouth  2 (two) times daily. [name], MD 01/08/2023 Active Self, Pharmacy Records            Home Care and Equipment/Supplies: Were Home Health Services Ordered?: NA Any new equipment or medical supplies ordered?: NA  Functional Questionnaire: Do you need assistance with bathing/showering or dressing?: No Do you need assistance with meal preparation?: No Do you need assistance with eating?: No Do you have difficulty maintaining continence: No Do you need assistance with  getting out of bed/getting out of a chair/moving?: No Do you have difficulty managing or taking your medications?: No  Follow up appointments reviewed: PCP Follow-up appointment confirmed?: Yes Date of PCP follow-up appointment?: 01/26/23 Follow-up Provider: [facility] Follow-up appointment confirmed?: Yes Date of Specialist follow-up appointment?: 01/29/23 Follow-Up Specialty Provider:: neuro Do you need transportation to your follow-up appointment?: No Do you understand care options if your condition(s) worsen?: Yes-patient verbalized understanding    SIGNATURE Karena Addison, LPN [facility] Nurse Health Advisor Direct Dial [phone]

## 2023-01-26 ENCOUNTER — Encounter: Payer: Self-pay | Admitting: Student

## 2023-01-26 ENCOUNTER — Ambulatory Visit (INDEPENDENT_AMBULATORY_CARE_PROVIDER_SITE_OTHER): Payer: Medicaid Other | Admitting: Student

## 2023-01-26 ENCOUNTER — Other Ambulatory Visit: Payer: Self-pay

## 2023-01-26 VITALS — BP 150/100 | HR 64 | Ht 71.0 in | Wt 356.4 lb

## 2023-01-26 DIAGNOSIS — R7989 Other specified abnormal findings of blood chemistry: Secondary | ICD-10-CM

## 2023-01-26 DIAGNOSIS — I10 Essential (primary) hypertension: Secondary | ICD-10-CM

## 2023-01-26 DIAGNOSIS — F430 Acute stress reaction: Secondary | ICD-10-CM

## 2023-01-26 DIAGNOSIS — G932 Benign intracranial hypertension: Secondary | ICD-10-CM

## 2023-01-26 MED ORDER — LORAZEPAM 1 MG PO TABS: 1.0000 mg | ORAL_TABLET | Freq: Two times a day (BID) | ORAL | 0 refills | Status: DC | PRN

## 2023-01-26 MED ORDER — OLMESARTAN-AMLODIPINE-HCTZ 40-5-25 MG PO TABS: 1.0000 | ORAL_TABLET | Freq: Every day | ORAL | Status: DC

## 2023-01-26 NOTE — Progress Notes (Unsigned)
    SUBJECTIVE:   CHIEF COMPLAINT / HPI:   Hospital Follow-up Profound sinus brady to the 30s, evaluated by cards and thought to be 2/2 her topiramate vs a vagal response to her IIH.  Her Diamox was uptitrated to 750mg  BID from 500mg  BID previously. Evaluated by NS for shunt and they recommended outpatient evaluation for possible shunt. I have previously placed this referral, unfortunately it seems we're having a hard time keeping her out of the hospital long enough to actually have her seen. She has an appointment to see them on Thursday of next week. Unfortunately feels like she will need an LP before then. Is having left sided tinnitus and blurring of her vision which are indicative signs that she is needing an LP. Plans to go to the ED to have this done over the weekend.   She notes the following medication changes were made at discharge: She was told to stop her olmesartan-amlodipine-hydrochlorothiazide, and was told to start taking levothyroxine daily.    OBJECTIVE:   BP (!) 150/100   Pulse 64   Ht 5\' 11"  (1.803 m)   Wt (!) 356 lb 6.4 oz (161.7 kg)   LMP 11/23/2022   SpO2 100%   BMI 49.71 kg/m   Gen: In good spirits, NAD HENT: PERRL, EOM intact, MMM Cardio: bradycardic, regular, no murmur Pulm: Normal WOB on RA Neuro: Gait normal, speech fluent, blurred vision and unilateral tinnitus on the left, good strength and sensation throughout   ASSESSMENT/PLAN:   IIH (idiopathic intracranial hypertension) Has been challenging to manage medically. Multiple admission and ED presentations in recent months. Based on symptoms, likely needs an LP prior to her visit with neurosurgery next Thursday.  - Continue Diamox 750mg  BID - Will present to ED this weekend for LP - Keep appointment with neurosurgery next Thursday  Primary hypertension Well above goal x2 today. Do not think it is wise for her to discontinue her olmesartan-amlodipine-hydrochlorothiazide. - Restart  olmesartan-amlodipine-hydrochlorothiazide 40-5-25mg  daily - Continue spironolactone 50mg  daily   Elevated TSH Was started on levothyroxine daily based on TSH of 6.66 while in hospital. Given that this was collected during an acute illness and she had a normal T4, I question whether the levothyroxine is needed. - DC levothyroxine - We will repeat TSH at 1 month followup      J Dorothyann Gibbs, MD [facility]

## 2023-01-26 NOTE — Patient Instructions (Signed)
Stop taking the levothyroxine, I'd like to check your thyroid function in the future  when you're not acutely ill before I hang disease label on you. I'm glad you've got neurosurgery follow-up next week. If you feel you need an LP over the weekend (you probably do), getting to the ED early is probably wise.  I'm resarting your BP meds.  I'm refilling your Ativan. As long as you're not using it more than 4 pils/week, this is just fine.

## 2023-01-28 DIAGNOSIS — R7989 Other specified abnormal findings of blood chemistry: Secondary | ICD-10-CM | POA: Insufficient documentation

## 2023-01-28 NOTE — Assessment & Plan Note (Signed)
Was started on levothyroxine daily based on TSH of 6.66 while in hospital. Given that this was collected during an acute illness and she had a normal T4, I question whether the levothyroxine is needed. - DC levothyroxine - We will repeat TSH at 1 month followup

## 2023-01-28 NOTE — Assessment & Plan Note (Signed)
Has been challenging to manage medically. Multiple admission and ED presentations in recent months. Based on symptoms, likely needs an LP prior to her visit with neurosurgery next Thursday.  - Continue Diamox 750mg  BID - Will present to ED this weekend for LP - Keep appointment with neurosurgery next Thursday

## 2023-01-28 NOTE — Assessment & Plan Note (Signed)
Well above goal x2 today. Do not think it is wise for her to discontinue her olmesartan-amlodipine-hydrochlorothiazide. - Restart olmesartan-amlodipine-hydrochlorothiazide 40-5-25mg  daily - Continue spironolactone 50mg  daily

## 2023-01-29 ENCOUNTER — Ambulatory Visit (INDEPENDENT_AMBULATORY_CARE_PROVIDER_SITE_OTHER): Payer: Medicaid Other | Admitting: Neurology

## 2023-01-29 ENCOUNTER — Encounter: Payer: Self-pay | Admitting: Neurology

## 2023-01-29 VITALS — BP 128/95 | HR 82 | Ht 71.0 in | Wt 351.0 lb

## 2023-01-29 DIAGNOSIS — G932 Benign intracranial hypertension: Secondary | ICD-10-CM

## 2023-01-29 DIAGNOSIS — H4710 Unspecified papilledema: Secondary | ICD-10-CM | POA: Diagnosis not present

## 2023-01-29 MED ORDER — ACETAZOLAMIDE 250 MG PO TABS: 1000.0000 mg | ORAL_TABLET | Freq: Two times a day (BID) | ORAL | 0 refills | Status: DC

## 2023-01-29 MED ORDER — TOPIRAMATE 200 MG PO TABS: 200.0000 mg | ORAL_TABLET | Freq: Two times a day (BID) | ORAL | 6 refills | Status: DC

## 2023-01-29 NOTE — Progress Notes (Signed)
[facility]  PATIENT: Tabitha Holland DOB: Apr 14, 1986  REQUESTING CLINICIAN: [name], MD HISTORY FROM: Patient  REASON FOR VISIT: Headaches    HISTORICAL  CHIEF COMPLAINT:  Chief Complaint  Patient presents with   New Patient (Initial Visit)    RM13, ALONE,  internal referral for IIH, MIGRAINES: 30/30 DAYS, BLURRY VISION: PT KNOWS SHE NEEDS LP BUT DOESN'T WANT ONE, HOSPITALIZED TWICE THIS MONTH   INTERVAL HISTORY 01/29/2023:  Patient presents today for follow-up, last visit was in January at that time we suspected IIH, sent her for LP with opening pressure of 27.  She was started on Diamox then Topamax added.  Patient reports during the past 8 months, she has been in and out of the hospital due to worsening headaches.  She did have a total of 3 LPs.  After each LP, she will have relieved for about 48 hours. Her last hospitalization was on September 9.  After that last visit, she was also recommended to follow-up with neurosurgery for evaluation of possible shunt, she does have an appointment scheduled this Thursday.  She reports currently she is in pain, her vision is getting worse left worse than right.  Pain is worse with movement.  She mainly sit down or lay down in a dark room.  She is still struggling with her weights feels like it might be related to her thyroid disease.    HISTORY OF PRESENT ILLNESS:  This is a 37 year old woman past medical history of anxiety, depression, hypertension, obesity, headaches who is presenting with complaint of persistent headaches for the past 9 days.  Patient reports she woke up last Wednesday morning with headaches and the headache did not go away.  On top of the headache she also experienced back pain, visual obscuration and buzzing feeling in the ears.  She also reports pressure in the head.  The pain was so severe that she presented to the ED 3 days ago.  At that time, she did have a head CT which showed no acute  abnormality but show evidence of partially empty sella.  She was also given a migraine cocktail which improved her symptoms.  She is still symptomatic, describes a headache at 7 out of 10, pressure squeezing pain behind the left eye which is also throbbing. She has tried Tylenol.  Patient reports she woke up today with loss of vision in the left eye. She did report a history of headaches since the age of 62 intermittent but this is the most severe episode she ever had.  She also feels with this episode her speech is not coherent she had difficulty formulating the words.   Headache History and Characteristics: Onset: Last Tuesday 05/09/22 Location: Behind left eye  Quality:  Pressure squeezing type pain, thrombing Intensity: 7 /10 currently   Duration: Last  Migrainous Features: Photophobia, nausea, vomiting.  Aura: No  History of brain injury or tumor: No  Family history: Not sure  Motion sickness: no Cardiac history: no  OTC: tylenol, Toradol  Caffeine: Morning coffee  Sleep:  Good  Mood/ Stress:   Prior prophylaxis: Propranolol: No  Verapamil:No TCA: No Topamax: No Depakote: No Effexor: No Cymbalta: No Neurontin:No  Prior abortives: Triptan: Yes  Anti-emetic: No Steroids: No Ergotamine suppository: No   OTHER MEDICAL CONDITIONS: Obesity, Headaches, Hypertension, anxiety/Depression   REVIEW OF SYSTEMS: Full 14 system review of systems performed and negative with exception of: As noted in the HPI   ALLERGIES: Allergies  Allergen Reactions   Butalbital Itching   Codeine Other (See Comments)    Migraines    Fish Allergy Itching   Hydrocodone Other (See Comments)    Migraines    Latex Itching   Percocet [Oxycodone-Acetaminophen] Other (See Comments)    Migraines    Shellfish Allergy Itching    HOME MEDICATIONS: Outpatient Medications Prior to Visit  Medication Sig Dispense Refill   acetaminophen (TYLENOL) 325 MG tablet Take 650 mg by mouth once as needed for  mild pain or moderate pain.     caffeine 200 MG TABS tablet Take 1 tablet (200 mg total) by mouth every 4 (four) hours as needed. 30 tablet 0   docusate sodium (COLACE) 100 MG capsule Take 1 capsule (100 mg total) by mouth 2 (two) times daily. 10 capsule 0   FLUoxetine (PROZAC) 40 MG capsule Take 1 capsule (40 mg total) by mouth daily. 30 capsule 0   LORazepam (ATIVAN) 1 MG tablet Take 1 tablet (1 mg total) by mouth 2 (two) times daily as needed for anxiety. TAKE 1 TABLET(1 MG) BY MOUTH TWICE DAILY AS NEEDED FOR ANXIETY OR PANIC Strength: 1 mg 6 tablet 0   methocarbamol (ROBAXIN) 500 MG tablet Take 1 tablet (500 mg total) by mouth every 8 (eight) hours as needed for muscle spasms. 30 tablet 0   Olmesartan-amLODIPine-HCTZ 40-5-25 MG TABS Take 1 tablet by mouth daily.     polyethylene glycol (MIRALAX / GLYCOLAX) 17 g packet Take 17 g by mouth daily. 14 each 0   Ruxolitinib Phosphate (OPZELURA) 1.5 % CREA Apply 1 Application topically daily as needed (eczema).     spironolactone (ALDACTONE) 50 MG tablet TAKE 1 TABLET(50 MG) BY MOUTH AT BEDTIME (Patient taking differently: Take 50 mg by mouth at bedtime.) 90 tablet 1   acetaZOLAMIDE (DIAMOX) 250 MG tablet Take 3 tablets (750 mg total) by mouth 2 (two) times daily. 180 tablet 2   topiramate (TOPAMAX) 100 MG tablet Take 1 tablet (100 mg total) by mouth 2 (two) times daily. 60 tablet 6   Facility-Administered Medications Prior to Visit  Medication Dose Route Frequency Provider Last Rate Last Admin   diclofenac Sodium (VOLTAREN) 1 % topical gel 2 g  2 g Topical PRN Bess Kinds, MD        PAST MEDICAL HISTORY: Past Medical History:  Diagnosis Date   Anxiety    Depression    Gallstones    History of anemia    per pt on 02/02/2021   History of suicidal ideation 10/02/2019   Hx of wisdom tooth extraction    Hypertension    IIH (idiopathic intracranial hypertension)    Migraines    occasional per pt 02/02/2021   Morbid obesity (HCC)     PAST  SURGICAL HISTORY: Past Surgical History:  Procedure Laterality Date   CESAREAN SECTION N/A 10/28/2013   Procedure: CESAREAN SECTION;  Surgeon: Allie Bossier, MD;  Location: [facility];  Service: Obstetrics;  Laterality: N/A;   CESAREAN SECTION N/A 09/15/2015   Procedure: CESAREAN SECTION;  Surgeon: Catalina Antigua, MD;  Location: [facility];  Service: Obstetrics;  Laterality: N/A;   CESAREAN SECTION N/A 05/26/2018   Procedure: CESAREAN SECTION;  Surgeon: Adam Phenix, MD;  Location: [facility];  Service: Obstetrics;  Laterality: N/A;   CHOLECYSTECTOMY     DILATATION & CURETTAGE/HYSTEROSCOPY WITH MYOSURE N/A 02/09/2021   Procedure: DILATATION & CURETTAGE/HYSTEROSCOPY WITH MYOSURE;  Surgeon: Reva Bores, MD;  Location: [facility] [place];  Service: Gynecology;  Laterality: N/A;  fibroid resection   WISDOM TOOTH EXTRACTION      FAMILY HISTORY: Family History  Adopted: Yes  Problem Relation Age of Onset   Hypertension Sister    Bipolar disorder Brother    Schizophrenia Brother     SOCIAL HISTORY: Social History   Socioeconomic History   Marital status: Media planner    Spouse name: Not on file   Number of children: 3   Years of education: Not on file   Highest education level: Not on file  Occupational History   Occupation: Artist  Tobacco Use   Smoking status: Some Days    Current packs/day: 0.25    Types: Cigarettes   Smokeless tobacco: Never  Vaping Use   Vaping status: Never Used  Substance and Sexual Activity   Alcohol use: Not Currently    Comment: 2-3 weekly   Drug use: Not Currently    Types: Marijuana   Sexual activity: Not Currently    Birth control/protection: None  Other Topics Concern   Not on file  Social History Narrative   Not on file   Social Determinants of Health   Financial Resource Strain: Not on file  Food Insecurity: No Food Insecurity (01/09/2023)   Hunger Vital Sign    Worried About Running Out of Food  in the Last Year: Never true    Ran Out of Food in the Last Year: Never true  Transportation Needs: No Transportation Needs (01/09/2023)   PRAPARE - Administrator, Civil Service (Medical): No    Lack of Transportation (Non-Medical): No  Physical Activity: Not on file  Stress: Not on file  Social Connections: Not on file  Intimate Partner Violence: Not At Risk (01/09/2023)   Humiliation, Afraid, Rape, and Kick questionnaire    Fear of Current or Ex-Partner: No    Emotionally Abused: No    Physically Abused: No    Sexually Abused: No    PHYSICAL EXAM  GENERAL EXAM/CONSTITUTIONAL: Vitals:  Vitals:   01/29/23 0933  BP: (!) 128/95  Pulse: 82  Weight: (!) 351 lb (159.2 kg)  Height: 5\' 11"  (1.803 m)   Body mass index is 48.95 kg/m. Wt Readings from Last 3 Encounters:  01/29/23 (!) 351 lb (159.2 kg)  01/26/23 (!) 356 lb 6.4 oz (161.7 kg)  01/09/23 (!) 353 lb 2.8 oz (160.2 kg)   Patient is in no distress; well developed, nourished and groomed; neck is supple, sitting in a dark room  EYES: Pupils round and reactive to light, Visual fields full to confrontation, Extraocular movements intacts,   MUSCULOSKELETAL: Gait, strength, tone, movements noted in Neurologic exam below  NEUROLOGIC: MENTAL STATUS:      No data to display         awake, alert, oriented to person, place and time recent and remote memory intact normal attention and concentration language fluent, comprehension intact, naming intact fund of knowledge appropriate  CRANIAL NERVE:  2nd - bilateral papilledema present  2nd, 3rd, 4th, 6th - pupils equal and reactive to light, visual fields full to confrontation, extraocular muscles intact, no nystagmus 5th - facial sensation symmetric 7th - facial strength symmetric 8th - hearing intact 9th - palate elevates symmetrically, uvula midline 11th - shoulder shrug symmetric 12th - tongue protrusion midline  MOTOR:  normal bulk and tone, full  strength in the BUE, BLE  SENSORY:  normal and symmetric to light touch  COORDINATION:  finger-nose-finger, fine finger movements  normal  REFLEXES:  deep tendon reflexes present and symmetric  GAIT/STATION:  normal   DIAGNOSTIC DATA (LABS, IMAGING, TESTING) - I reviewed patient records, labs, notes, testing and imaging myself where available.  Lab Results  Component Value Date   WBC 7.2 01/09/2023   HGB 10.8 (L) 01/09/2023   HCT 33.8 (L) 01/09/2023   MCV 88.7 01/09/2023   PLT 172 01/09/2023      Component Value Date/Time   NA 138 01/09/2023 0251   NA 140 03/31/2022 1512   K 3.5 01/09/2023 0251   CL 109 01/09/2023 0251   CO2 23 01/09/2023 0251   GLUCOSE 107 (H) 01/09/2023 0251   BUN 8 01/09/2023 0251   BUN 10 03/31/2022 1512   CREATININE 0.89 01/09/2023 0251   CREATININE 0.54 07/18/2013 0924   CALCIUM 9.2 01/09/2023 0251   PROT 6.8 01/08/2023 1823   PROT 6.6 08/03/2020 0952   ALBUMIN 3.3 (L) 01/08/2023 1823   ALBUMIN 3.7 (L) 08/03/2020 0952   AST 14 (L) 01/08/2023 1823   ALT 22 01/08/2023 1823   ALKPHOS 82 01/08/2023 1823   BILITOT 0.6 01/08/2023 1823   BILITOT 0.6 08/03/2020 0952   GFRNONAA >60 01/09/2023 0251   GFRAA >60 10/02/2019 1645   Lab Results  Component Value Date   CHOL 139 08/03/2020   HDL 55 08/03/2020   LDLCALC 72 08/03/2020   TRIG 58 08/03/2020   CHOLHDL 2.5 08/03/2020   Lab Results  Component Value Date   HGBA1C 5.2 12/05/2021   No results found for: "VITAMINB12" Lab Results  Component Value Date   TSH 6.666 (H) 01/09/2023    Head CT 05/15/2022 1. No evidence of acute intracranial abnormality. 2. Partially empty sella, often an incidental finding though can be seen within idiopathic intracranial hypertension.   MRI Brain/MRV Head 12/27/2022 1. No evidence of acute intracranial abnormality. 2. Partially empty sella, narrowed left transverse sinus and mildly prominent Meckel's caves, which is nonspecific but can be seen with  idiopathic intracranial hypertension. 3. No evidence of dural venous sinus thrombosis.   ASSESSMENT AND PLAN  37 y.o. year old female with history of obesity, hypertension, IIH,  anxiety/depression who is presenting for follow-up for IIH.  She did have a recent MRV which showed narrowing of the left transverse sinus.  Patient is already on Diamox and topiramate, will increase Diamox to 1000 mg twice daily and increase topiramate to 200 mg twice daily.  She does have an appointment with neurosurgery this Thursday therefore will defer referral to [facility] imaging for another LP as this may interfere with her appointment scheduled this Thursday.  I will see her in 3 months for follow-up or sooner if worse.  Did advise her to present to the ED if there is any acute loss of her vision.    1. IIH (idiopathic intracranial hypertension)   2. Morbid obesity (HCC)   3. Papilledema     Patient Instructions  Increase Diamox to 1000 mg twice daily  Increase Topiramate to 200 mg twice daily  Follow up with Neurosurgery as scheduled  Follow up with Ophthalmology as scheduled  Go to the ED if there is acute changes in your vision Return in 3 months or sooner if worse    No orders of the defined types were placed in this encounter.   Meds ordered this encounter  Medications   topiramate (TOPAMAX) 200 MG tablet    Sig: Take 1 tablet (200 mg total) by mouth 2 (two) times daily.  Dispense:  60 tablet    Refill:  6   acetaZOLAMIDE (DIAMOX) 250 MG tablet    Sig: Take 4 tablets (1,000 mg total) by mouth 2 (two) times daily.    Dispense:  960 tablet    Refill:  0    Return in about 3 months (around 04/30/2023).    [name], MD 01/29/2023, 5:24 PM  [facility] [address], [address] [place], [place] [address] [phone]

## 2023-01-29 NOTE — Patient Instructions (Addendum)
Increase Diamox to 1000 mg twice daily  Increase Topiramate to 200 mg twice daily  Follow up with Neurosurgery as scheduled  Follow up with Ophthalmology as scheduled  Go to the ED if there is acute changes in your vision Return in 3 months or sooner if worse

## 2023-02-01 ENCOUNTER — Encounter: Payer: Self-pay | Admitting: Student

## 2023-02-19 ENCOUNTER — Encounter: Payer: Self-pay | Admitting: Student

## 2023-02-23 ENCOUNTER — Ambulatory Visit (INDEPENDENT_AMBULATORY_CARE_PROVIDER_SITE_OTHER): Payer: Medicaid Other | Admitting: Student

## 2023-02-23 VITALS — BP 132/84 | HR 61 | Wt 374.6 lb

## 2023-02-23 DIAGNOSIS — R7989 Other specified abnormal findings of blood chemistry: Secondary | ICD-10-CM | POA: Diagnosis not present

## 2023-02-23 DIAGNOSIS — F32A Depression, unspecified: Secondary | ICD-10-CM | POA: Diagnosis not present

## 2023-02-23 DIAGNOSIS — R7309 Other abnormal glucose: Secondary | ICD-10-CM | POA: Diagnosis not present

## 2023-02-23 DIAGNOSIS — Z23 Encounter for immunization: Secondary | ICD-10-CM | POA: Diagnosis not present

## 2023-02-23 DIAGNOSIS — G932 Benign intracranial hypertension: Secondary | ICD-10-CM

## 2023-02-23 DIAGNOSIS — F430 Acute stress reaction: Secondary | ICD-10-CM

## 2023-02-23 MED ORDER — FLUOXETINE HCL 40 MG PO CAPS
40.0000 mg | ORAL_CAPSULE | Freq: Every day | ORAL | 0 refills | Status: DC
Start: 2023-02-23 — End: 2024-03-24

## 2023-02-23 MED ORDER — WEGOVY 0.25 MG/0.5ML ~~LOC~~ SOAJ
0.2500 mg | SUBCUTANEOUS | 1 refills | Status: DC
Start: 2023-02-23 — End: 2023-05-24

## 2023-02-23 MED ORDER — BUPROPION HCL ER (XL) 150 MG PO TB24: 150.0000 mg | ORAL_TABLET | Freq: Every day | ORAL | 1 refills | Status: DC

## 2023-02-23 MED ORDER — LORAZEPAM 1 MG PO TABS: 1.0000 mg | ORAL_TABLET | Freq: Two times a day (BID) | ORAL | 0 refills | Status: DC | PRN

## 2023-02-23 NOTE — Patient Instructions (Addendum)
[email or web address] is the website for the bariatric program. You can sign up for the webinar.   In the meantime we'll start you on Wegovy 0.25mg  weekly. I'll you bak in a few weeks and we'll probably increase to 0.5mg  weekly if you're doing well.  Send me a MyChart message as soon as you hear from [facility].   You can find a therapist on [email or web address] and search for folks who take your insurance. I'm adding a medication called Wellbutrin to try and get better control of your depression in this stressful time.    Flu shot today!   Eliezer Mccoy, MD

## 2023-02-23 NOTE — Progress Notes (Signed)
SUBJECTIVE:   CHIEF COMPLAINT / HPI:   IIH She is in the midst of a back-and-forth with neurology, neurosurgery, and ophthalmology.  As I understand it she was evaluated by Dr. Conchita Paris who requested that she see Dr. Dione Booze for evaluation of her left-sided visual impairment because he feels that it may represent an intrinsic eye issue versus her IIH.  Unfortunately, I do not yet have access to either of their notes.  She relays today that Dr. Laruth Bouchard office sent her notes to Dr. Val Riles office and Dr. Conchita Paris will be reviewing this and making a decision as to whether to move forward with VP shunt placement. Unfortunately, she remains rather symptomatic with left-sided vision loss and left-sided tinnitus which are her typical symptoms of IIH.  Last time she saw Dr. Teresa Coombs, he increased her Topamax and Diamox doses.  She has not noticed any improvement on these increased doses.  Depression Progressive worsening in her depressive symptoms with her being in "limbo" with regard to treatment of her IIH as above.  Depressed mood being her most prominent symptom, also with some weight gain.  Seems that both her physical and psychological symptoms have been negatively influencing her ability to pursue the exercise regimen that she would like. Denies any suicidal thoughts or intent. Contracts for safety.   Morbid Obesity She is disappointed to see that her weight is back up in the 370s.  Had recently gotten down to 350s through diet and exercise.  However, her physical ailments and major depression as above have been major impediments here.  We have discussed getting her on a GLP-1 agent in the past but had deferred because she was having some unspecified abdominal pains at that time.  These are now much improved and more or less resolved and she would like to revisit a GLP-1 agent.  She is also open to considering bariatric surgery, though is concerned about the cost of this.   OBJECTIVE:   BP  132/84   Pulse 61   Wt (!) 374 lb 9.6 oz (169.9 kg)   LMP 01/15/2023 (Approximate)   SpO2 100%   BMI 52.25 kg/m   Gen: Alert and engaged, intermittently tearful HENT: EOMs intact, PERRLA Cardio: RRR, no murmur Pulm: Normal WOB On RA Neuro: Blurred vision on L, otherwise normal strength and sensory exam. Hearing intact bilaterally but with tinnitus on the L. Gait is normal. Speech is fluent.  Psych: No SI   ASSESSMENT/PLAN:   IIH (idiopathic intracranial hypertension) Currently working with her neurologist [employer] officer[employer]) and neurosurgeon Conchita Paris) to determine best management plan for her.  Does not seem to be responding well to medical therapy despite dose increases of her Diamox and topiramate.  - Eagerly awaiting Dr. Val Riles determination regarding shunt placement  - ER precautions reviewed. While she is symptomatic at present, symptoms are rather stable from last I saw her, do not see an indication for emergent LP at this time  - Continue Diamox 1000mg  BID - Continue Topamax 200mg  BID   Depression Progressive symptoms in the setting of multiple life stressors as above. -Continue fluoxetine 40 mg daily -Will add Wellbutrin XL 150 mg daily.  Choice of Wellbutrin is guided by desire for both weight loss and smoking cessation as potential ancillary benefits.  Morbid obesity (HCC) Morbid obesity associated with IIH and hypertension.  Weight loss will be key in addressing these significant comorbidities.  As her GI symptoms are no longer present, I think now would be a fine time  to initiate GLP-1 agonist therapy.  No personal or family history of pancreatitis or thyroid cancer/MEN. -Start Wegovy 0.25 mg weekly -She has follow-up scheduled with me in 3 weeks -She has had success with diet and lifestyle interventions and has lost 20 pounds recently, but unfortunately regained weight for herself and her symptoms.  She obviously has the lifestyle modification toolkit to make meaningful  change and I hope she will continue to stick to these changes.  -It has been about a year since her checked an A1c on her, we will repeat this today as she has high risk for developing insulin resistance/diabetes   Elevated TSH Elevated to 6.66 during her last hospitalization.  She was started on levothyroxine in the hospital but I discontinued this at her hospital follow-up.  If she is not acutely ill at this time, I think this would be an appropriate time to repeat TSH.      Tabitha Mccoy, MD [facility] Left vision is worse.

## 2023-02-24 ENCOUNTER — Other Ambulatory Visit: Payer: Self-pay | Admitting: Student

## 2023-02-24 LAB — TSH RFX ON ABNORMAL TO FREE T4: TSH: 1.73 u[IU]/mL (ref 0.450–4.500)

## 2023-02-24 LAB — HEMOGLOBIN A1C
Est. average glucose Bld gHb Est-mCnc: 114 mg/dL
Hgb A1c MFr Bld: 5.6 % (ref 4.8–5.6)

## 2023-02-24 NOTE — Assessment & Plan Note (Signed)
Elevated to 6.66 during her last hospitalization.  She was started on levothyroxine in the hospital but I discontinued this at her hospital follow-up.  If she is not acutely ill at this time, I think this would be an appropriate time to repeat TSH.

## 2023-02-24 NOTE — Assessment & Plan Note (Signed)
Progressive symptoms in the setting of multiple life stressors as above. -Continue fluoxetine 40 mg daily -Will add Wellbutrin XL 150 mg daily.  Choice of Wellbutrin is guided by desire for both weight loss and smoking cessation as potential ancillary benefits.

## 2023-02-24 NOTE — Assessment & Plan Note (Signed)
Currently working with her neurologist [employer] officer[employer]) and neurosurgeon Conchita Paris) to determine best management plan for her.  Does not seem to be responding well to medical therapy despite dose increases of her Diamox and topiramate.  - Eagerly awaiting Dr. Val Riles determination regarding shunt placement  - ER precautions reviewed. While she is symptomatic at present, symptoms are rather stable from last I saw her, do not see an indication for emergent LP at this time  - Continue Diamox 1000mg  BID - Continue Topamax 200mg  BID

## 2023-02-24 NOTE — Assessment & Plan Note (Addendum)
Morbid obesity associated with IIH and hypertension.  Weight loss will be key in addressing these significant comorbidities.  As her GI symptoms are no longer present, I think now would be a fine time to initiate GLP-1 agonist therapy.  No personal or family history of pancreatitis or thyroid cancer/MEN. -Start Wegovy 0.25 mg weekly -She has follow-up scheduled with me in 3 weeks -She has had success with diet and lifestyle interventions and has lost 20 pounds recently, but unfortunately regained weight for herself and her symptoms.  She obviously has the lifestyle modification toolkit to make meaningful change and I hope she will continue to stick to these changes.  -It has been about a year since her checked an A1c on her, we will repeat this today as she has high risk for developing insulin resistance/diabetes

## 2023-02-27 ENCOUNTER — Encounter (HOSPITAL_COMMUNITY): Payer: Self-pay | Admitting: Emergency Medicine

## 2023-02-27 ENCOUNTER — Other Ambulatory Visit: Payer: Self-pay | Admitting: Neurology

## 2023-02-27 ENCOUNTER — Ambulatory Visit (HOSPITAL_COMMUNITY)
Admission: EM | Admit: 2023-02-27 | Discharge: 2023-02-27 | Disposition: A | Discharge status: Home / Self Care | Payer: Medicaid Other | Attending: Internal Medicine | Admitting: Internal Medicine

## 2023-02-27 DIAGNOSIS — B349 Viral infection, unspecified: Secondary | ICD-10-CM | POA: Diagnosis not present

## 2023-02-27 DIAGNOSIS — H4710 Unspecified papilledema: Secondary | ICD-10-CM

## 2023-02-27 DIAGNOSIS — G932 Benign intracranial hypertension: Secondary | ICD-10-CM

## 2023-02-27 LAB — POC COVID19/FLU A&B COMBO
Covid Antigen, POC: NEGATIVE
Influenza A Antigen, POC: NEGATIVE
Influenza B Antigen, POC: NEGATIVE

## 2023-02-27 LAB — POCT RAPID STREP A (OFFICE): Rapid Strep A Screen: NEGATIVE

## 2023-02-27 NOTE — ED Triage Notes (Signed)
Pt c/o headache, sore throat, cough, chest pain, and fatigue since Sunday. States her son tested positive for strep recently.

## 2023-02-27 NOTE — ED Provider Notes (Signed)
[facility]    CSN: [id number] Arrival date & time: 02/27/23  1316      History   Chief Complaint Chief Complaint  Patient presents with   Generalized Body Aches   Cough   Headache   Sore Throat    HPI Tabitha Holland is a 37 y.o. female.    Cough Associated symptoms: chills, diaphoresis, fever, headaches, rhinorrhea and sore throat   Associated symptoms: no chest pain, no rash and no shortness of breath   Headache Associated symptoms: congestion, cough (dry), fatigue, fever and sore throat   Associated symptoms: no abdominal pain and no nausea   Sore Throat Associated symptoms include headaches. Pertinent negatives include no chest pain, no abdominal pain and no shortness of breath.  Got a flu shot 4 days ago, started to feel bad 3 days ago symptoms include body aches, fatigue, chills, subjective fever, headache, sore throat, cough.  Son had strep throat last week. Missed work due to illness.  Did not take her blood pressure medicine today.  She is concerned because her wife is immunocompromised.  Past Medical History:  Diagnosis Date   Anxiety    Depression    Gallstones    History of anemia    per pt on 02/02/2021   History of suicidal ideation 10/02/2019   Hx of wisdom tooth extraction    Hypertension    IIH (idiopathic intracranial hypertension)    Migraines    occasional per pt 02/02/2021   Morbid obesity (HCC)     Patient Active Problem List   Diagnosis Date Noted   Elevated TSH 01/28/2023   Bradycardia 01/08/2023   AKI (acute kidney injury) (HCC) 01/02/2023   IIH (idiopathic intracranial hypertension)    Depression    Anxiety    Acute stress disorder 08/07/2022   Headache 01/27/2022   Chest pain 12/22/2021   Nausea 12/06/2021   Personal history of peptic ulcer disease 12/06/2021   Hepatomegaly 11/04/2021   Encounter to establish care with new doctor 11/04/2021   Submucous uterine fibroid 02/02/2021   Abnormal uterine bleeding  11/22/2020   Pelvic pain 11/22/2020   Previous cesarean section 05/26/2018   Morbid obesity (HCC) 05/26/2018   Marijuana abuse 05/26/2018   Tobacco use 05/12/2013   Primary hypertension 05/12/2013    Past Surgical History:  Procedure Laterality Date   CESAREAN SECTION N/A 10/28/2013   Procedure: CESAREAN SECTION;  Surgeon: Allie Bossier, MD;  Location: [facility];  Service: Obstetrics;  Laterality: N/A;   CESAREAN SECTION N/A 09/15/2015   Procedure: CESAREAN SECTION;  Surgeon: Catalina Antigua, MD;  Location: [facility];  Service: Obstetrics;  Laterality: N/A;   CESAREAN SECTION N/A 05/26/2018   Procedure: CESAREAN SECTION;  Surgeon: Adam Phenix, MD;  Location: [facility];  Service: Obstetrics;  Laterality: N/A;   CHOLECYSTECTOMY     DILATATION & CURETTAGE/HYSTEROSCOPY WITH MYOSURE N/A 02/09/2021   Procedure: DILATATION & CURETTAGE/HYSTEROSCOPY WITH MYOSURE;  Surgeon: Reva Bores, MD;  Location: [facility] [place];  Service: Gynecology;  Laterality: N/A;  fibroid resection   WISDOM TOOTH EXTRACTION      OB History     Gravida  5   Para  5   Term  5   Preterm  0   AB  0   Living  5      SAB  0   IAB  0   Ectopic  0   Multiple  0   Live Births  5  Home Medications    Prior to Admission medications   Medication Sig Start Date End Date Taking? Authorizing Provider  acetaminophen (TYLENOL) 325 MG tablet Take 650 mg by mouth once as needed for mild pain or moderate pain.    [provider]  acetaZOLAMIDE (DIAMOX) 250 MG tablet Take 4 tablets (1,000 mg total) by mouth 2 (two) times daily. 01/29/23 05/29/23  [name], MD  buPROPion (WELLBUTRIN XL) 150 MG 24 hr tablet Take 1 tablet (150 mg total) by mouth daily. 02/23/23   [name], MD  caffeine 200 MG TABS tablet Take 1 tablet (200 mg total) by mouth every 4 (four) hours as needed. Patient not taking: Reported on 02/27/2023 01/04/23   Rolly Salter, MD   docusate sodium (COLACE) 100 MG capsule Take 1 capsule (100 mg total) by mouth 2 (two) times daily. Patient not taking: Reported on 02/27/2023 01/04/23   Rolly Salter, MD  FLUoxetine (PROZAC) 20 MG capsule TAKE 1 CAPSULE BY MOUTH EVERY DAY 02/26/23   [name], MD  FLUoxetine (PROZAC) 40 MG capsule Take 1 capsule (40 mg total) by mouth daily. 02/23/23   [name], MD  LORazepam (ATIVAN) 1 MG tablet Take 1 tablet (1 mg total) by mouth 2 (two) times daily as needed for anxiety. TAKE 1 TABLET(1 MG) BY MOUTH TWICE DAILY AS NEEDED FOR ANXIETY OR PANIC Strength: 1 mg 02/23/23   [name], MD  methocarbamol (ROBAXIN) 500 MG tablet Take 1 tablet (500 mg total) by mouth every 8 (eight) hours as needed for muscle spasms. Patient not taking: Reported on 02/27/2023 01/04/23   Rolly Salter, MD  Olmesartan-amLODIPine-HCTZ 40-5-25 MG TABS Take 1 tablet by mouth daily. 01/26/23   [name], MD  polyethylene glycol (MIRALAX / GLYCOLAX) 17 g packet Take 17 g by mouth daily. 01/05/23   Rolly Salter, MD  Ruxolitinib Phosphate (OPZELURA) 1.5 % CREA Apply 1 Application topically daily as needed (eczema).    [provider]  Semaglutide-Weight Management (WEGOVY) 0.25 MG/0.5ML SOAJ Inject 0.25 mg into the skin once a week. 02/23/23   [name], MD  spironolactone (ALDACTONE) 50 MG tablet TAKE 1 TABLET(50 MG) BY MOUTH AT BEDTIME Patient taking differently: Take 50 mg by mouth at bedtime. 08/30/22   [name], MD  topiramate (TOPAMAX) 200 MG tablet Take 1 tablet (200 mg total) by mouth 2 (two) times daily. 01/29/23 08/27/23  [name], MD    Family History Family History  Adopted: Yes  Problem Relation Age of Onset   Hypertension Sister    Bipolar disorder Brother    Schizophrenia Brother     Social History Social History   Tobacco Use   Smoking status: Some Days    Current packs/day: 0.25    Types: Cigarettes   Smokeless tobacco: Never  Vaping Use    Vaping status: Never Used  Substance Use Topics   Alcohol use: Not Currently    Comment: 2-3 weekly   Drug use: Not Currently    Types: Marijuana     Allergies   Butalbital, Codeine, Fish allergy, Hydrocodone, Latex, Percocet [oxycodone-acetaminophen], and Shellfish allergy   Review of Systems Review of Systems  Constitutional:  Positive for chills, diaphoresis, fatigue and fever.  HENT:  Positive for congestion, rhinorrhea and sore throat. Negative for trouble swallowing.   Respiratory:  Positive for cough (dry). Negative for shortness of breath.   Cardiovascular:  Negative for chest pain.  Gastrointestinal:  Negative for abdominal pain and nausea.  Skin:  Negative for rash.  Neurological:  Positive for headaches.     Physical Exam Triage Vital Signs ED Triage Vitals  Encounter Vitals Group     BP 02/27/23 1440 (!) 159/100     Systolic BP Percentile --      Diastolic BP Percentile --      Pulse Rate 02/27/23 1423 (!) 56     Resp 02/27/23 1423 16     Temp 02/27/23 1423 98.4 F (36.9 C)     Temp src --      SpO2 02/27/23 1423 98 %     Weight --      Height --      Head Circumference --      Peak Flow --      Pain Score 02/27/23 1437 7     Pain Loc --      Pain Education --      Exclude from Growth Chart --    No data found.  Updated Vital Signs BP (!) 159/100 (BP Location: Right Wrist) Comment: pt has not taken BP medication due to sore throat  Pulse (!) 56   Temp 98.4 F (36.9 C)   Resp 16   LMP 02/26/2023 (Approximate)   SpO2 98%   Visual Acuity Right Eye Distance:   Left Eye Distance:   Bilateral Distance:    Right Eye Near:   Left Eye Near:    Bilateral Near:     Physical Exam Vitals and nursing note reviewed.  Constitutional:      Appearance: She is obese. She is not ill-appearing.  HENT:     Head: Normocephalic.     Mouth/Throat:     Mouth: Mucous membranes are moist. No oral lesions.     Pharynx: Oropharynx is clear. No pharyngeal  swelling, oropharyngeal exudate, posterior oropharyngeal erythema or uvula swelling.  Eyes:     Extraocular Movements: Extraocular movements intact.  Cardiovascular:     Rate and Rhythm: Normal rate.  Pulmonary:     Effort: Pulmonary effort is normal.     Breath sounds: Normal breath sounds.  Musculoskeletal:     Cervical back: Normal range of motion and neck supple.  Lymphadenopathy:     Cervical: No cervical adenopathy.  Neurological:     Mental Status: She is alert.      UC Treatments / Results  Labs (all labs ordered are listed, but only abnormal results are displayed) Labs Reviewed  POCT RAPID STREP A (OFFICE)  POC COVID19/FLU A&B COMBO    EKG   Radiology No results found.  Procedures Procedures (including critical care time)  Medications Ordered in UC Medications - No data to display  Initial Impression / Assessment and Plan / UC Course  I have reviewed the triage vital signs and the nursing notes.  Pertinent labs & imaging results that were available during my care of the patient were reviewed by me and considered in my medical decision making (see chart for details).     37 year old female feeling sick after getting a flu shot.  Had recent strep exposure.  Having sore throat, rhinorrhea, nasal congestion, cough, subjective fever, sweats and fatigue. Of care strep is negative point-of-care COVID flu test negative. Coricidin HBP OTC for symptoms.  Follow-up as needed Final Clinical Impressions(s) / UC Diagnoses   Final diagnoses:  None   Discharge Instructions   None    ED Prescriptions   None    PDMP  not reviewed this encounter.   Meliton Rattan, [place] 02/27/23 [phone]

## 2023-02-27 NOTE — Discharge Instructions (Signed)
Over-the-counter medicines for symptoms, avoid medicines containing Sudafed and phenylephrine.  Coricidin HBP is fine. take your blood pressure medicines as prescribed Wear a mask while having symptoms Follow-up as needed

## 2023-02-28 ENCOUNTER — Ambulatory Visit
Admission: RE | Admit: 2023-02-28 | Discharge: 2023-02-28 | Disposition: A | Discharge status: Home / Self Care | Payer: Medicaid Other | Source: Ambulatory Visit | Attending: Neurology | Admitting: Neurology

## 2023-02-28 DIAGNOSIS — G932 Benign intracranial hypertension: Secondary | ICD-10-CM

## 2023-02-28 DIAGNOSIS — H4710 Unspecified papilledema: Secondary | ICD-10-CM

## 2023-02-28 NOTE — Discharge Instructions (Signed)

## 2023-03-06 ENCOUNTER — Telehealth: Payer: Self-pay

## 2023-03-06 NOTE — Telephone Encounter (Signed)
Patient calls nurse line requesting to speak with Dr. Marisue Humble regarding her IIH.   I asked if there was anything that I could help her with and she said that she just needed to talk with provider regarding current care plan.   She is requesting returned call today if possible.   [phone].  Veronda Prude, RN

## 2023-03-08 NOTE — Telephone Encounter (Signed)
Patient calls to report that she lost her job due to frequent absences related to her IIH. Expresses her frustration at not having a long-term management plan regarding shunt placement with neurosurgery.  She is considering filing for short-term disability until she can get this sorted out. I agree that this seems reasonable given the need for multiple hospitalizations, frequent visits to our office, Dr. Karie Georges office, and Dr. Val Riles office and the likely need for surgery. She will fax any needed paperwork to our office.  I also advised her to call Dr. Val Riles office to ask for an update. She relays that she will do this. Eliezer Mccoy, MD

## 2023-03-16 ENCOUNTER — Encounter: Payer: Self-pay | Admitting: Student

## 2023-03-16 ENCOUNTER — Ambulatory Visit (INDEPENDENT_AMBULATORY_CARE_PROVIDER_SITE_OTHER): Payer: Medicaid Other | Admitting: Student

## 2023-03-16 VITALS — BP 140/90 | HR 64 | Wt 365.8 lb

## 2023-03-16 DIAGNOSIS — G932 Benign intracranial hypertension: Secondary | ICD-10-CM | POA: Diagnosis present

## 2023-03-16 DIAGNOSIS — M545 Low back pain, unspecified: Secondary | ICD-10-CM

## 2023-03-16 MED ORDER — CELECOXIB 200 MG PO CAPS: 200.0000 mg | ORAL_CAPSULE | Freq: Two times a day (BID) | ORAL | 0 refills | Status: DC

## 2023-03-16 NOTE — Progress Notes (Unsigned)
     SUBJECTIVE:   CHIEF COMPLAINT / HPI:   IIH Follow-up Patient is here to follow-up on her IIH.  This is currently being managed primarily by Dr. Teresa Coombs.  Her primary symptoms are tinnitus and headache and low back pain.  All of these are present today.  Her last LP was on 02/28/2023.  She has an appointment on next Wednesday 03/21/2023 for surgical planning with Dr. Conchita Paris for VP shunt placement. She is currently taking acetazolamide 1000mg  BID and Topiramate 200mg  BID.  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  She notes that her back pain gets worse after she has been standing for some time.  Weight loss efforts Excited to see that her weight is down 9 pounds today.  She tells me that she has been intentionally reducing her caloric input and spending more time walking as discussed above.  Unfortunately, some of her walking is affected by her back pain and headaches and general malaise associated with her IIH. At our last visit, we tried to get her on Wegovy but she has not yet heard anything from her pharmacy about this being ready for her.  On looking back through notes, I do not see any information about a PA etc.  PERTINENT  PMH / PSH: IIH, obesity, depression, bradycardia  OBJECTIVE:   BP (!) 140/90   Pulse 64   Wt (!) 365 lb 12.8 oz (165.9 kg)   LMP 02/26/2023 (Approximate)   SpO2 100%   BMI 51.02 kg/m   General: In good spirits, NAD HEENT: EOM's intact, PERRLA Cardio: Regular rate, regular rhythm, without murmur Pulm: Normal work of breathing on room air, exam limited by body habitus Neuroexam: Gait is normal, speech is fluent intact hearing but tinnitus on the left Psych: Without suicidal ideation, mood and affect are appropriate to the situation  ASSESSMENT/PLAN:   IIH (idiopathic intracranial hypertension) Scheduled for surgical consultation with Dr. Conchita Paris on 11/20.  She has a whole list of neurologic complaints that  are likely all related to her IIH.  I would defer chasing down any of the specific complaints (headache, tinnitus, back pain) until after her shunt placement given that any neurological issues going to be clouded by the presence of her IIH.  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  - Continue Acetazolamide 1000mg  BID and Topiramate 200mg  BID - Will eagerly await a plan from Dr. Conchita Paris re: surgical planning   Morbid obesity (HCC) Morbid obesity associated with IIH and hypertension. Unfortunately never heard back re: the [facility] we ordered last visit. Has succeeded in losing 9 pounds with diet and exercise though!  - Congratulated on weight loss; continue lifestyle interventions - I will follow-up with our pharmacy team re: her [id number]     J Dorothyann Gibbs, MD [facility] Can't bend all the way over.

## 2023-03-16 NOTE — Patient Instructions (Addendum)
Congrats on the weight loss! I'm glad you've gotten back into walking. These habits will go a long way toward helping you "take off" after surgery.  I will be eagerly awaiting Nundkumar's note after your visit on Wednesday.  Once surgery is scheduled, come see me within 3-4 weeks after surgery to check on what's better and what's not.  I'm sending in some Celebrex for your back pain.  Eliezer Mccoy, MD

## 2023-03-18 NOTE — Assessment & Plan Note (Signed)
Scheduled for surgical consultation with Dr. Conchita Paris on 11/20.  She has a whole list of neurologic complaints that are likely all related to her IIH.  I would defer chasing down any of the specific complaints (headache, tinnitus, back pain) until after her shunt placement given that any neurological issues going to be clouded by the presence of her IIH.  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  - Continue Acetazolamide 1000mg  BID and Topiramate 200mg  BID - Will eagerly await a plan from Dr. Conchita Paris re: surgical planning

## 2023-03-18 NOTE — Assessment & Plan Note (Signed)
Morbid obesity associated with IIH and hypertension. Unfortunately never heard back re: the [facility] we ordered last visit. Has succeeded in losing 9 pounds with diet and exercise though!  - Congratulated on weight loss; continue lifestyle interventions - I will follow-up with our pharmacy team re: Tabitha Holland

## 2023-03-19 ENCOUNTER — Other Ambulatory Visit (HOSPITAL_COMMUNITY): Payer: Self-pay

## 2023-03-19 ENCOUNTER — Telehealth: Payer: Self-pay

## 2023-03-19 NOTE — Telephone Encounter (Signed)
Pharmacy Patient Advocate Encounter  Received notification from [facility] that Prior Authorization for [facility] has been APPROVED from 03/19/23 to 09/16/23

## 2023-03-19 NOTE — Telephone Encounter (Signed)
Pharmacy Patient Advocate Encounter   Received notification from Physician's Office that prior authorization for [facility] is required/requested.   Insurance verification completed.   The patient is insured through [facility] .   PA required; PA submitted to above mentioned insurance via CoverMyMeds Key/confirmation #/EOC [id number]. Status is pending

## 2023-03-19 NOTE — Telephone Encounter (Signed)
-----   Message from Eliezer Mccoy sent at 03/19/2023  3:45 AM EST ----- [name], I ordered [name] at our last visit, but she was saying she never heard anything about approval or that the pharmacy had her prescription. Do you have any way of following up on this for [place]?  Thanks! Eliezer Mccoy, MD

## 2023-03-22 ENCOUNTER — Other Ambulatory Visit: Payer: Self-pay | Admitting: Neurosurgery

## 2023-03-23 ENCOUNTER — Other Ambulatory Visit: Payer: Self-pay | Admitting: Neurosurgery

## 2023-04-10 NOTE — Pre-Procedure Instructions (Signed)
Surgical Instructions   Your procedure is scheduled on April 17, 2023. Report to [facility] Main Entrance "A" at 12:00 P.M., then check in with the Admitting office. Any questions or running late day of surgery: call [phone]  Questions prior to your surgery date: call [phone], Monday-Friday, 8am-4pm. If you experience any cold or flu symptoms such as cough, fever, chills, shortness of breath, etc. between now and your scheduled surgery, please notify us at the above number.     Remember:  Do not eat or drink after midnight the night before your surgery    Take these medicines the morning of surgery with A SIP OF WATER: buPROPion (WELLBUTRIN XL)  FLUoxetine (PROZAC)  topiramate (TOPAMAX)    May take these medicines IF NEEDED: acetaminophen (TYLENOL)  LORazepam (ATIVAN)    STOP taking your Semaglutide [facility]) one week prior to surgery. DO NOT take any doses after December 9th.   One week prior to surgery, STOP taking any Aspirin (unless otherwise instructed by your surgeon) Aleve, Naproxen, Ibuprofen, Motrin, Advil, Goody's, BC's, all herbal medications, fish oil, and non-prescription vitamins. This includes your medication: celecoxib (CELEBREX)                      Do NOT Smoke (Tobacco/Vaping) for 24 hours prior to your procedure.  If you use a CPAP at night, you may bring your mask/headgear for your overnight stay.   You will be asked to remove any contacts, glasses, piercing's, hearing aid's, dentures/partials prior to surgery. Please bring cases for these items if needed.    Patients discharged the day of surgery will not be allowed to drive home, and someone needs to stay with them for 24 hours.  SURGICAL WAITING ROOM VISITATION Patients may have no more than 2 support people in the waiting area - these visitors may rotate.   Pre-op nurse will coordinate an appropriate time for 1 ADULT support person, who may not rotate, to accompany patient in pre-op.   Children under the age of 92 must have an adult with them who is not the patient and must remain in the main waiting area with an adult.  If the patient needs to stay at the hospital during part of their recovery, the visitor guidelines for inpatient rooms apply.  Please refer to the [facility] website for the visitor guidelines for any additional information.   If you received a COVID test during your pre-op visit  it is requested that you wear a mask when out in public, stay away from anyone that may not be feeling well and notify your surgeon if you develop symptoms. If you have been in contact with anyone that has tested positive in the last 10 days please notify you surgeon.      Pre-operative CHG Bathing Instructions   You can play a key role in reducing the risk of infection after surgery. Your skin needs to be as free of germs as possible. You can reduce the number of germs on your skin by washing with CHG (chlorhexidine gluconate) soap before surgery. CHG is an antiseptic soap that kills germs and continues to kill germs even after washing.   DO NOT use if you have an allergy to chlorhexidine/CHG or antibacterial soaps. If your skin becomes reddened or irritated, stop using the CHG and notify one of our RNs at [phone].              TAKE A SHOWER THE NIGHT BEFORE SURGERY AND  THE DAY OF SURGERY    Please keep in mind the following:  DO NOT shave, including legs and underarms, 48 hours prior to surgery.   You may shave your face before/day of surgery.  Place clean sheets on your bed the night before surgery Use a clean washcloth (not used since being washed) for each shower. DO NOT sleep with pet's night before surgery.  CHG Shower Instructions:  Wash your face and private area with normal soap. If you choose to wash your hair, wash first with your normal shampoo.  After you use shampoo/soap, rinse your hair and body thoroughly to remove shampoo/soap residue.  Turn the  water OFF and apply half the bottle of CHG soap to a CLEAN washcloth.  Apply CHG soap ONLY FROM YOUR NECK DOWN TO YOUR TOES (washing for 3-5 minutes)  DO NOT use CHG soap on face, private areas, open wounds, or sores.  Pay special attention to the area where your surgery is being performed.  If you are having back surgery, having someone wash your back for you may be helpful. Wait 2 minutes after CHG soap is applied, then you may rinse off the CHG soap.  Pat dry with a clean towel  Put on clean pajamas    Additional instructions for the day of surgery: DO NOT APPLY any lotions, deodorants, cologne, or perfumes.   Do not wear jewelry or makeup Do not wear nail polish, gel polish, artificial nails, or any other type of covering on natural nails (fingers and toes) Do not bring valuables to the hospital. [facility] is not responsible for valuables/personal belongings. Put on clean/comfortable clothes.  Please brush your teeth.  Ask your nurse before applying any prescription medications to the skin.

## 2023-04-11 ENCOUNTER — Other Ambulatory Visit: Payer: Self-pay

## 2023-04-11 ENCOUNTER — Encounter (HOSPITAL_COMMUNITY): Payer: Self-pay

## 2023-04-11 ENCOUNTER — Encounter (HOSPITAL_COMMUNITY)
Admission: RE | Admit: 2023-04-11 | Discharge: 2023-04-11 | Disposition: A | Discharge status: Home / Self Care | Payer: Medicaid Other | Source: Ambulatory Visit | Attending: Neurosurgery | Admitting: Neurosurgery

## 2023-04-11 VITALS — BP 173/94 | HR 54 | Temp 97.8°F | Resp 20 | Ht 71.0 in | Wt 373.4 lb

## 2023-04-11 DIAGNOSIS — Z01818 Encounter for other preprocedural examination: Secondary | ICD-10-CM | POA: Diagnosis present

## 2023-04-11 DIAGNOSIS — G4489 Other headache syndrome: Secondary | ICD-10-CM | POA: Diagnosis not present

## 2023-04-11 DIAGNOSIS — H538 Other visual disturbances: Secondary | ICD-10-CM | POA: Diagnosis not present

## 2023-04-11 DIAGNOSIS — G43809 Other migraine, not intractable, without status migrainosus: Secondary | ICD-10-CM | POA: Diagnosis not present

## 2023-04-11 DIAGNOSIS — H53149 Visual discomfort, unspecified: Secondary | ICD-10-CM | POA: Diagnosis not present

## 2023-04-11 DIAGNOSIS — R001 Bradycardia, unspecified: Secondary | ICD-10-CM | POA: Diagnosis not present

## 2023-04-11 DIAGNOSIS — I10 Essential (primary) hypertension: Secondary | ICD-10-CM | POA: Insufficient documentation

## 2023-04-11 DIAGNOSIS — G932 Benign intracranial hypertension: Secondary | ICD-10-CM | POA: Insufficient documentation

## 2023-04-11 DIAGNOSIS — G988 Other disorders of nervous system: Secondary | ICD-10-CM

## 2023-04-11 HISTORY — DX: Other complications of anesthesia, initial encounter: T88.59XA

## 2023-04-11 HISTORY — DX: Anemia, unspecified: D64.9

## 2023-04-11 LAB — BASIC METABOLIC PANEL
Anion gap: 11 (ref 5–15)
BUN: 10 mg/dL (ref 6–20)
CO2: 28 mmol/L (ref 22–32)
Calcium: 9.6 mg/dL (ref 8.9–10.3)
Chloride: 106 mmol/L (ref 98–111)
Creatinine, Ser: 0.69 mg/dL (ref 0.44–1.00)
GFR, Estimated: >60 mL/min (ref >60)
Glucose, Bld: 101 mg/dL — ABNORMAL HIGH (ref 70–99)
Potassium: 4 mmol/L (ref 3.5–5.1)
Sodium: 145 mmol/L (ref 135–145)

## 2023-04-11 LAB — CBC
HCT: 33 % — ABNORMAL LOW (ref 36.0–46.0)
Hemoglobin: 10.3 g/dL — ABNORMAL LOW (ref 12.0–15.0)
MCH: 28.1 pg (ref 26.0–34.0)
MCHC: 31.2 g/dL (ref 30.0–36.0)
MCV: 90.2 fL (ref 80.0–100.0)
Platelets: 249 10*3/uL (ref 150–400)
RBC: 3.66 MIL/uL — ABNORMAL LOW (ref 3.87–5.11)
RDW: 13 % (ref 11.5–15.5)
WBC: 7.6 10*3/uL (ref 4.0–10.5)
nRBC: 0 % (ref 0.0–0.2)

## 2023-04-12 ENCOUNTER — Telehealth: Payer: Self-pay | Admitting: Neurology

## 2023-04-12 NOTE — Telephone Encounter (Signed)
Pt called wanting to inform the provider that yesterday when she went in for her surgery prep, the surgeon wanted her to report that she was having the symptoms again of headaches, blurry vision in the L eye and a whooshing noise in both of her ears. This is just an FYI for it to be documented .

## 2023-04-12 NOTE — Progress Notes (Addendum)
PCP - Dr. Darnelle Spangle Cardiologist - Denies Neurologist - Dr. [name]  PPM/ICD - Denies Device Orders - n/a Rep Notified - n/a  Chest x-ray - 10/06/2022 EKG - 01/09/2023 Stress Test - Denies ECHO - 01/09/2023 Cardiac Cath - Denies  Sleep Study - Denies CPAP - n/a  No DM.  Last dose of GLP1 agonist-  Pt is on Wegovy for weight management. Due to back order she has not started medication GLP1 instructions: Pt instructed NOT to start medication until after surgery  Blood Thinner Instructions: n/a Aspirin Instructions: n/a  NPO after midnight  COVID TEST- n/a   Anesthesia review: Yes. Pt endorses left eye blurriness, "whoosh" sound in left ear and constant migraine at appointment (all known symptoms to her of increased cranial fluid/pressure). Discussed with Antionette Poles, PA-C. Pt instructed to contact Neurologist today regarding return of symptoms. Antionette Poles to speak with Anesthesia team regarding continuing diuretics DOS or holding. Pt does note that HR is normally less than 50 (56 at pre-op appointment)  Patient denies shortness of breath, fever, cough and chest pain at PAT appointment. Pt denies any respiratory illness/infection in the last two months.   All instructions explained to the patient, with a verbal understanding of the material. Patient agrees to go over the instructions while at home for a better understanding. Patient also instructed to self quarantine after being tested for COVID-19. The opportunity to ask questions was provided.

## 2023-04-12 NOTE — Telephone Encounter (Signed)
Noted and sent to provider as Juluis Rainier

## 2023-04-12 NOTE — Progress Notes (Signed)
Anesthesia Chart Review:  37 y.o. female with medical history significant for idiopathic intracranial hypertension (IIH), essential hypertension, sinus bradycardia, chronic migraine, chronic anxiety/depression, severe morbid obesity (BMI 52), recently admitted with worsening headache/photophobia/photophobia, underwent LP on 12/25/2022 and 12/30/2022, LP showed elevated opening pressure, headache improved after removal of CSF.  Sinus bradycardia noted during recent admission.  ?From topiramate vs vagal response from her IIH. Cardiology consulted and no further work up or Tx recommended. Avoid negative chronotropes. Echocardiogram unremarkable.  Pt reported symptoms of headaches, blurry vision, "whooshing" noise in both ears. She was instructed to call neurologist Dr. Karie Georges office for input.   Confirmed with Dr. Glade Stanford that its okay for pt to take diamox on DOS.   It is likely she will be hypertensive on arrival on DOS as she will not be able to take her combo olmesartan-amlodipine-hydrochlorothiazide. She also has significant anxiety around the surgery. BP at PAT was 173/94, however, review of recent records show it is generally under somewhat better control. BP Readings from Last 3 Encounters:  04/11/23 (!) 173/94  03/16/23 (!) 140/90  02/28/23 (!) 158/84   Preop labs reviewed, mild anemia with Hgb 10.3, otherwise unremarkable.   EKG 01/09/23: Marked sinus bradycardia. Rate 41. Low voltage QRS  TTE 01/09/23:  1. Left ventricular ejection fraction, by estimation, is 55 to 60%. The  left ventricle has normal function. The left ventricle has no regional  wall motion abnormalities. There is mild concentric left ventricular  hypertrophy. Left ventricular diastolic  parameters were normal.   2. Right ventricular systolic function is normal. The right ventricular  size is normal. Tricuspid regurgitation signal is inadequate for assessing  PA pressure.   3. Left atrial size was mildly  dilated.   4. The mitral valve is grossly normal. Trivial mitral valve  regurgitation. No evidence of mitral stenosis.   5. The aortic valve is tricuspid. Aortic valve regurgitation is not  visualized. No aortic stenosis is present.   6. The inferior vena cava is normal in size with greater than 50%  respiratory variability, suggesting right atrial pressure of 3 mmHg.      [facility] Short Stay Center/Anesthesiology Phone [phone] 04/12/2023 2:10 PM

## 2023-04-12 NOTE — Anesthesia Preprocedure Evaluation (Addendum)
Anesthesia Evaluation  Patient identified by MRN, date of birth, ID band Patient awake    Reviewed: Allergy & Precautions, NPO status , Patient's Chart, lab work & pertinent test results  Airway Mallampati: II  TM Distance: >3 FB Neck ROM: Full    Dental no notable dental hx.    Pulmonary Current Smoker and Patient abstained from smoking.   Pulmonary exam normal        Cardiovascular hypertension, Pt. on medications Normal cardiovascular exam  ECHO: 1. Left ventricular ejection fraction, by estimation, is 55 to 60%. The  left ventricle has normal function. The left ventricle has no regional  wall motion abnormalities. There is mild concentric left ventricular  hypertrophy. Left ventricular diastolic  parameters were normal.   2. Right ventricular systolic function is normal. The right ventricular  size is normal. Tricuspid regurgitation signal is inadequate for assessing  PA pressure.   3. Left atrial size was mildly dilated.   4. The mitral valve is grossly normal. Trivial mitral valve  regurgitation. No evidence of mitral stenosis.   5. The aortic valve is tricuspid. Aortic valve regurgitation is not  visualized. No aortic stenosis is present.   6. The inferior vena cava is normal in size with greater than 50%  respiratory variability, suggesting right atrial pressure of 3 mmHg.     Neuro/Psych  Headaches PSYCHIATRIC DISORDERS Anxiety Depression       GI/Hepatic negative GI ROS, Neg liver ROS,,,  Endo/Other    Class 4 obesity  Renal/GU negative Renal ROS     Musculoskeletal negative musculoskeletal ROS (+)    Abdominal  (+) + obese  Peds  Hematology  (+) Blood dyscrasia, anemia   Anesthesia Other Findings PSEUDOTUMOR CEREBRI  Reproductive/Obstetrics Hcg negative                              Anesthesia Physical Anesthesia Plan  ASA: 4  Anesthesia Plan: General   Post-op Pain  Management:    Induction: Intravenous  PONV Risk Score and Plan: 2 and Ondansetron, Dexamethasone, Midazolam and Treatment may vary due to age or medical condition  Airway Management Planned: Oral ETT  Additional Equipment:   Intra-op Plan:   Post-operative Plan: Extubation in OR  Informed Consent: I have reviewed the patients History and Physical, chart, labs and discussed the procedure including the risks, benefits and alternatives for the proposed anesthesia with the patient or authorized representative who has indicated his/her understanding and acceptance.     Dental advisory given  Plan Discussed with: CRNA  Anesthesia Plan Comments: (PAT note by Antionette Poles, PA-C: 37 y.o. female with medical history significant for idiopathic intracranial hypertension (IIH), essential hypertension, sinus bradycardia, chronic migraine, chronic anxiety/depression, severe morbid obesity (BMI 52), recently admitted with worsening headache/photophobia/photophobia, underwent LP on 12/25/2022 and 12/30/2022, LP showed elevated opening pressure, headache improved after removal of CSF.  Sinus bradycardia noted during recent admission.  ?From topiramate vs vagal response from her IIH. Cardiology consulted and no further work up or Tx recommended. Avoid negative chronotropes. Echocardiogram unremarkable.  Pt reported symptoms of headaches, blurry vision, "whooshing" noise in both ears. She was instructed to call neurologist Dr. Karie Georges office for input.   Confirmed with Dr. Glade Stanford that its okay for pt to take diamox on DOS.   It is likely she will be hypertensive on arrival on DOS as she will not be able to take her combo olmesartan-amlodipine-hydrochlorothiazide. She also  has significant anxiety around the surgery. BP at PAT was 173/94, however, review of recent records show it is generally under somewhat better control. BP Readings from Last 3 Encounters: 04/11/23 (!) 173/94 03/16/23 (!)  140/90 02/28/23 (!) 158/84  Preop labs reviewed, mild anemia with Hgb 10.3, otherwise unremarkable.   EKG 01/09/23: Marked sinus bradycardia. Rate 41. Low voltage QRS  TTE 01/09/23:  1. Left ventricular ejection fraction, by estimation, is 55 to 60%. The  left ventricle has normal function. The left ventricle has no regional  wall motion abnormalities. There is mild concentric left ventricular  hypertrophy. Left ventricular diastolic  parameters were normal.   2. Right ventricular systolic function is normal. The right ventricular  size is normal. Tricuspid regurgitation signal is inadequate for assessing  PA pressure.   3. Left atrial size was mildly dilated.   4. The mitral valve is grossly normal. Trivial mitral valve  regurgitation. No evidence of mitral stenosis.   5. The aortic valve is tricuspid. Aortic valve regurgitation is not  visualized. No aortic stenosis is present.   6. The inferior vena cava is normal in size with greater than 50%  respiratory variability, suggesting right atrial pressure of 3 mmHg.    )         Anesthesia Quick Evaluation

## 2023-04-13 ENCOUNTER — Emergency Department (HOSPITAL_COMMUNITY)
Admission: EM | Admit: 2023-04-13 | Discharge: 2023-04-13 | Disposition: A | Discharge status: Home / Self Care | Payer: Medicaid Other | Attending: Emergency Medicine | Admitting: Emergency Medicine

## 2023-04-13 ENCOUNTER — Emergency Department (HOSPITAL_COMMUNITY): Payer: Medicaid Other

## 2023-04-13 ENCOUNTER — Other Ambulatory Visit: Payer: Self-pay

## 2023-04-13 ENCOUNTER — Encounter (HOSPITAL_COMMUNITY): Payer: Self-pay

## 2023-04-13 DIAGNOSIS — Z91040 Latex allergy status: Secondary | ICD-10-CM | POA: Insufficient documentation

## 2023-04-13 DIAGNOSIS — I10 Essential (primary) hypertension: Secondary | ICD-10-CM | POA: Insufficient documentation

## 2023-04-13 DIAGNOSIS — G932 Benign intracranial hypertension: Secondary | ICD-10-CM | POA: Diagnosis not present

## 2023-04-13 DIAGNOSIS — R519 Headache, unspecified: Secondary | ICD-10-CM | POA: Diagnosis present

## 2023-04-13 LAB — CBC WITH DIFFERENTIAL/PLATELET
Abs Immature Granulocytes: 0.02 10*3/uL (ref 0.00–0.07)
Basophils Absolute: 0 10*3/uL (ref 0.0–0.1)
Basophils Relative: 1 %
Eosinophils Absolute: 0.1 10*3/uL (ref 0.0–0.5)
Eosinophils Relative: 1 %
HCT: 36.7 % (ref 36.0–46.0)
Hemoglobin: 11.4 g/dL — ABNORMAL LOW (ref 12.0–15.0)
Immature Granulocytes: 0 %
Lymphocytes Relative: 25 %
Lymphs Abs: 1.6 10*3/uL (ref 0.7–4.0)
MCH: 28.1 pg (ref 26.0–34.0)
MCHC: 31.1 g/dL (ref 30.0–36.0)
MCV: 90.6 fL (ref 80.0–100.0)
Monocytes Absolute: 0.3 10*3/uL (ref 0.1–1.0)
Monocytes Relative: 5 %
Neutro Abs: 4.3 10*3/uL (ref 1.7–7.7)
Neutrophils Relative %: 68 %
Platelets: 266 10*3/uL (ref 150–400)
RBC: 4.05 MIL/uL (ref 3.87–5.11)
RDW: 12.9 % (ref 11.5–15.5)
WBC: 6.3 10*3/uL (ref 4.0–10.5)
nRBC: 0 % (ref 0.0–0.2)

## 2023-04-13 LAB — PROTEIN, CSF: Total  Protein, CSF: 30 mg/dL (ref 15–45)

## 2023-04-13 LAB — BASIC METABOLIC PANEL
Anion gap: 6 (ref 5–15)
BUN: 10 mg/dL (ref 6–20)
CO2: 26 mmol/L (ref 22–32)
Calcium: 8.7 mg/dL — ABNORMAL LOW (ref 8.9–10.3)
Chloride: 102 mmol/L (ref 98–111)
Creatinine, Ser: 0.67 mg/dL (ref 0.44–1.00)
GFR, Estimated: >60 mL/min (ref >60)
Glucose, Bld: 79 mg/dL (ref 70–99)
Potassium: 3.4 mmol/L — ABNORMAL LOW (ref 3.5–5.1)
Sodium: 134 mmol/L — ABNORMAL LOW (ref 135–145)

## 2023-04-13 LAB — CSF CELL COUNT WITH DIFFERENTIAL
RBC Count, CSF: 280 /mm3 — ABNORMAL HIGH
RBC Count, CSF: 8 /mm3 — ABNORMAL HIGH
Tube #: 1
Tube #: 4
WBC, CSF: 1 /mm3 (ref 0–5)
WBC, CSF: 3 /mm3 (ref 0–5)

## 2023-04-13 LAB — GLUCOSE, CSF: Glucose, CSF: 53 mg/dL (ref 40–70)

## 2023-04-13 LAB — GRAM STAIN: Gram Stain: NONE SEEN

## 2023-04-13 MED ORDER — PROCHLORPERAZINE EDISYLATE 10 MG/2ML IJ SOLN
10.0000 mg | Freq: Once | INTRAMUSCULAR | Status: AC
  Administered 2023-04-13: 10 mg via INTRAVENOUS
  Filled 2023-04-13: qty 2

## 2023-04-13 MED ORDER — SODIUM CHLORIDE 0.9 % IV BOLUS
1000.0000 mL | Freq: Once | INTRAVENOUS | Status: AC
  Administered 2023-04-13: 1000 mL via INTRAVENOUS

## 2023-04-13 MED ORDER — LIDOCAINE HCL (PF) 1 % IJ SOLN: 30.0000 mL | Freq: Once | INTRAMUSCULAR | Status: DC

## 2023-04-13 MED ORDER — KETOROLAC TROMETHAMINE 15 MG/ML IJ SOLN
15.0000 mg | Freq: Once | INTRAMUSCULAR | Status: AC
Start: 2023-04-13 — End: 2023-04-13
  Administered 2023-04-13: 15 mg via INTRAVENOUS
  Filled 2023-04-13: qty 1

## 2023-04-13 MED ORDER — DIPHENHYDRAMINE HCL 50 MG/ML IJ SOLN
12.5000 mg | Freq: Once | INTRAMUSCULAR | Status: AC
Start: 2023-04-13 — End: 2023-04-13
  Administered 2023-04-13: 12.5 mg via INTRAVENOUS
  Filled 2023-04-13: qty 1

## 2023-04-13 MED ORDER — LIDOCAINE HCL (PF) 1 % IJ SOLN
5.0000 mL | Freq: Once | INTRAMUSCULAR | Status: AC
  Administered 2023-04-13: 2.5 mL via INTRADERMAL

## 2023-04-13 MED ORDER — LIDOCAINE HCL (PF) 1 % IJ SOLN
INTRAMUSCULAR | Status: AC
  Filled 2023-04-13: qty 5

## 2023-04-13 NOTE — Discharge Instructions (Addendum)
We evaluated you for your headache.  You had a lumbar puncture performed by radiology which helped with your symptoms.  Please follow-up with Dr. Conchita Paris for your surgery next week.  Your spinal fluid was reassuring.  We did not see any signs of an infection.  Please return if you have any new or worsening symptoms such as fevers or chills, neck stiffness, nausea or vomiting, severe uncontrolled headache, visual disturbances, or any other concerning symptoms.

## 2023-04-13 NOTE — Procedures (Signed)
Fluoroscopically-guided L2-L3 lumbar puncture.  Opening pressure: 28 cm water.  11 mL of CSF collected and sent for laboratory studies.  Closing pressure: 17 cm water.  No immediate post-procedure complication.

## 2023-04-13 NOTE — ED Provider Notes (Signed)
[place] EMERGENCY DEPARTMENT AT [facility] Provider Note  CSN: [id number] Arrival date & time: 04/13/23 1314  Chief Complaint(s) Loss of Vision and Migraine  HPI Tabitha Holland is a 37 y.o. female history of IIH, obesity presenting to the emergency department with headache.  Patient reports headaches consistent with her typical headaches that she has been recently having with IIH which include photophobia, visual disturbance in the left eye, numbness and tingling in the face and pins-and-needles in the legs.  No fevers or chills, neck stiffness.  No chest pain or shortness of breath.  No abdominal pain.  Does not use any blood thinners.  She has been getting lumbar punctures which typically improve her symptoms.  She is scheduled to have a VP shunt placed soon, however she was having worsening headaches and did not feel like she could wait.   Past Medical History Past Medical History:  Diagnosis Date   Anemia    Hx. Not currently (2024)   Anxiety    Complication of anesthesia    High anxiety r/t surgery   Depression    Gallstones    History of anemia    per pt on 02/02/2021   History of suicidal ideation 10/02/2019   Hx of wisdom tooth extraction    Hypertension    IIH (idiopathic intracranial hypertension)    Migraines    Related Intracraniel fluid   Morbid obesity [facility])    Patient Active Problem List   Diagnosis Date Noted   Bradycardia 01/08/2023   IIH (idiopathic intracranial hypertension)    Depression    Anxiety    Hepatomegaly 11/04/2021   Submucous uterine fibroid 02/02/2021   Morbid obesity (HCC) 05/26/2018   Tobacco use 05/12/2013   Primary hypertension 05/12/2013   Home Medication(s) Prior to Admission medications   Medication Sig Start Date End Date Taking? Authorizing Provider  acetaZOLAMIDE (DIAMOX) 250 MG tablet Take 4 tablets (1,000 mg total) by mouth 2 (two) times daily. 01/29/23 05/29/23 Yes Camara, Amalia Hailey, MD  buPROPion (WELLBUTRIN  XL) 150 MG 24 hr tablet Take 1 tablet (150 mg total) by mouth daily. 02/23/23  Yes [name], MD  famotidine (PEPCID) 20 MG tablet Take 20 mg by mouth daily as needed for heartburn or indigestion.   Yes [provider]  FLUoxetine (PROZAC) 40 MG capsule Take 1 capsule (40 mg total) by mouth daily. 02/23/23  Yes [name], MD  LORazepam (ATIVAN) 1 MG tablet Take 1 tablet (1 mg total) by mouth 2 (two) times daily as needed for anxiety. TAKE 1 TABLET(1 MG) BY MOUTH TWICE DAILY AS NEEDED FOR ANXIETY OR PANIC Strength: 1 mg Patient taking differently: Take 1 mg by mouth 2 (two) times daily as needed for anxiety (or panic). 02/23/23  Yes [name], MD  methocarbamol (ROBAXIN) 500 MG tablet Take 500 mg by mouth every 8 (eight) hours as needed for muscle spasms.   Yes [provider]  Olmesartan-amLODIPine-HCTZ 40-5-25 MG TABS Take 1 tablet by mouth daily. 01/26/23  Yes [name], MD  Ruxolitinib Phosphate (OPZELURA) 1.5 % CREA Apply 1 Application topically daily as needed (eczema).   Yes [provider]  spironolactone (ALDACTONE) 50 MG tablet TAKE 1 TABLET(50 MG) BY MOUTH AT BEDTIME Patient taking differently: Take 50 mg by mouth daily. 08/30/22  Yes [name], MD  topiramate (TOPAMAX) 200 MG tablet Take 1 tablet (200 mg total) by mouth 2 (two) times daily. 01/29/23 08/27/23 Yes [name], MD  TYLENOL 500 MG tablet Take 500-1,000 mg by mouth every 6 (six) hours as needed for mild pain (pain score 1-3) or headache.   Yes [provider]  celecoxib (CELEBREX) 200 MG capsule Take 1 capsule (200 mg total) by mouth 2 (two) times daily. Patient not taking: Reported on 04/13/2023 03/16/23   [name], MD  FLUoxetine (PROZAC) 20 MG capsule TAKE 1 CAPSULE BY MOUTH EVERY DAY Patient not taking: Reported on 04/13/2023 02/26/23   [name], MD  polyethylene glycol (MIRALAX / GLYCOLAX) 17 g packet Take 17 g by mouth daily. Patient not  taking: Reported on 04/13/2023 01/05/23   Rolly Salter, MD  Semaglutide-Weight Management [facility]) 0.25 MG/0.5ML SOAJ Inject 0.25 mg into the skin once a week. Patient not taking: Reported on 04/13/2023 02/23/23   [name], MD                                                                                                                                    Past Surgical History Past Surgical History:  Procedure Laterality Date   CESAREAN SECTION N/A 10/28/2013   Procedure: CESAREAN SECTION;  Surgeon: Allie Bossier, MD;  Location: [facility];  Service: Obstetrics;  Laterality: N/A;   CESAREAN SECTION N/A 09/15/2015   Procedure: CESAREAN SECTION;  Surgeon: Catalina Antigua, MD;  Location: [facility];  Service: Obstetrics;  Laterality: N/A;   CESAREAN SECTION N/A 05/26/2018   Procedure: CESAREAN SECTION;  Surgeon: Adam Phenix, MD;  Location: [facility] BIRTHING SUITES;  Service: Obstetrics;  Laterality: N/A;   CHOLECYSTECTOMY     Early 2000s   DILATATION & CURETTAGE/HYSTEROSCOPY WITH MYOSURE N/A 02/09/2021   Procedure: DILATATION & CURETTAGE/HYSTEROSCOPY WITH MYOSURE;  Surgeon: Reva Bores, MD;  Location: [facility];  Service: Gynecology;  Laterality: N/A;  fibroid resection   WISDOM TOOTH EXTRACTION     Family History Family History  Adopted: Yes  Problem Relation Age of Onset   Hypertension Sister    Bipolar disorder Brother    Schizophrenia Brother     Social History Social History   Tobacco Use   Smoking status: Every Day    Current packs/day: 0.25    Types: Cigarettes   Smokeless tobacco: Never   Tobacco comments:    3 cigarettes per day as of 04/11/2023  Vaping Use   Vaping status: Never Used  Substance Use Topics   Alcohol use: Not Currently    Comment: Rarely   Drug use: Not Currently    Types: Marijuana   Allergies Butalbital, Codeine, Fish allergy, Hydrocodone, Latex, Percocet [oxycodone-acetaminophen], and Shellfish allergy  Review of  Systems Review of Systems  All other systems reviewed and are negative.   Physical Exam Vital Signs  I have reviewed the triage vital signs BP 137/73   Pulse 63   Temp 98.2 F (36.8 C) (Oral)   Resp 16   Ht 5'  11" (1.803 m)   Wt (!) 167.8 kg   LMP 03/30/2023 (Exact Date)   SpO2 100%   BMI 51.60 kg/m  Physical Exam Vitals and nursing note reviewed.  Constitutional:      General: She is not in acute distress.    Appearance: She is well-developed.  HENT:     Head: Normocephalic and atraumatic.     Mouth/Throat:     Mouth: Mucous membranes are moist.  Eyes:     Pupils: Pupils are equal, round, and reactive to light.  Cardiovascular:     Rate and Rhythm: Normal rate and regular rhythm.     Heart sounds: No murmur heard. Pulmonary:     Effort: Pulmonary effort is normal. No respiratory distress.     Breath sounds: Normal breath sounds.  Abdominal:     General: Abdomen is flat.     Palpations: Abdomen is soft.     Tenderness: There is no abdominal tenderness.  Musculoskeletal:        General: No tenderness.     Right lower leg: No edema.     Left lower leg: No edema.  Skin:    General: Skin is warm and dry.  Neurological:     General: No focal deficit present.     Mental Status: She is alert and oriented to person, place, and time. Mental status is at baseline.     Cranial Nerves: No cranial nerve deficit.     Motor: No weakness.  Psychiatric:        Mood and Affect: Mood normal.        Behavior: Behavior normal.     ED Results and Treatments Labs (all labs ordered are listed, but only abnormal results are displayed) Labs Reviewed  CBC WITH DIFFERENTIAL/PLATELET - Abnormal; Notable for the following components:      Result Value   Hemoglobin 11.4 (*)    All other components within normal limits  BASIC METABOLIC PANEL - Abnormal; Notable for the following components:   Sodium 134 (*)    Potassium 3.4 (*)    Calcium 8.7 (*)    All other components within  normal limits  CSF CELL COUNT WITH DIFFERENTIAL - Abnormal; Notable for the following components:   RBC Count, CSF 280 (*)    All other components within normal limits  CSF CELL COUNT WITH DIFFERENTIAL - Abnormal; Notable for the following components:   RBC Count, CSF 8 (*)    All other components within normal limits  CSF CULTURE W GRAM STAIN  GRAM STAIN  GLUCOSE, CSF  PROTEIN, CSF                                                                                                                          Radiology DG FL GUIDED LUMBAR PUNCTURE Result Date: 04/13/2023 Doran Durand, DO     04/13/2023  5:36 PM Fluoroscopically-guided L2-L3 lumbar puncture. Opening pressure: 28 cm water. 11 mL of CSF collected  and sent for laboratory studies. Closing pressure: 17 cm water. No immediate post-procedure complication.   CT Head Wo Contrast Result Date: 04/13/2023 CLINICAL DATA:  Headache, increasing frequency or severity. EXAM: CT HEAD WITHOUT CONTRAST TECHNIQUE: Contiguous axial images were obtained from the base of the skull through the vertex without intravenous contrast. RADIATION DOSE REDUCTION: This exam was performed according to the departmental dose-optimization program which includes automated exposure control, adjustment of the mA and/or kV according to patient size and/or use of iterative reconstruction technique. COMPARISON:  Head CT 01/08/2023. FINDINGS: Brain: No acute intracranial hemorrhage. Gray-white differentiation is preserved. No hydrocephalus or extra-axial collection. No mass effect or midline shift. Partially empty sella. Vascular: No hyperdense vessel or unexpected calcification. Skull: No calvarial fracture or suspicious bone lesion. Skull base is unremarkable. Sinuses/Orbits: No acute finding. Other: None. IMPRESSION: 1. No acute intracranial abnormality. 2. Partially empty sella, which can be seen in the setting of idiopathic intracranial hypertension. Electronically Signed    By: Orvan Falconer M.D.   On: 04/13/2023 17:20    Pertinent labs & imaging results that were available during my care of the patient were reviewed by me and considered in my medical decision making (see MDM for details).  Medications Ordered in ED Medications  lidocaine (PF) (XYLOCAINE) 1 % injection 30 mL (has no administration in time range)  sodium chloride 0.9 % bolus 1,000 mL (0 mLs Intravenous Stopped 04/13/23 1907)  ketorolac (TORADOL) 15 MG/ML injection 15 mg (15 mg Intravenous Given 04/13/23 1744)  prochlorperazine (COMPAZINE) injection 10 mg (10 mg Intravenous Given 04/13/23 1747)  diphenhydrAMINE (BENADRYL) injection 12.5 mg (12.5 mg Intravenous Given 04/13/23 1745)  lidocaine (PF) (XYLOCAINE) 1 % injection 5 mL (2.5 mLs Intradermal Given 04/13/23 1723)                                                                                                                                     Procedures Procedures  (including critical care time)  Medical Decision Making / ED Course   MDM:  37 year old female presenting to the emergency department with headaches and visual changes.  She reports her symptoms feel consistent with her chronic IH symptoms.  She is awake and alert with no obvious deficit.  Lumbar puncture will be technically challenging given her obesity.  Discussed with radiology and they would be happy to perform fluoroscopy guided LP.  CT head was obtained which shows no evidence of acute process pending formal read.  Will send some basic studies.  If patient is feeling better, anticipate discharge home so that she can have her surgery soon.  Clinical Course as of 04/13/23 2204  Fri Apr 13, 2023  1819 Is returned from the fluoroscopy suite.  She did have her LP performed and reports feeling better.  She received some additional migraine treatment.  Some CSF studies were sent and are pending.  Once this is returned likely discharge. [WS]  2200 Her CSF studies are  reassuring.  She feels much better.  She would like to go home.  She has follow-up with her neurosurgeon on Tuesday for surgery. Will discharge patient to home. All questions answered. Patient comfortable with plan of discharge. Return precautions discussed with patient and specified on the after visit summary.  [WS]    Clinical Course User Index [WS] Lonell Grandchild, MD     Additional history obtained: -Additional history obtained from family -External records from outside source obtained and reviewed including: Chart review including previous notes, labs, imaging, consultation notes including prior visits for IIH /LP   Lab Tests: -I ordered, reviewed, and interpreted labs.   The pertinent results include:   Labs Reviewed  CBC WITH DIFFERENTIAL/PLATELET - Abnormal; Notable for the following components:      Result Value   Hemoglobin 11.4 (*)    All other components within normal limits  BASIC METABOLIC PANEL - Abnormal; Notable for the following components:   Sodium 134 (*)    Potassium 3.4 (*)    Calcium 8.7 (*)    All other components within normal limits  CSF CELL COUNT WITH DIFFERENTIAL - Abnormal; Notable for the following components:   RBC Count, CSF 280 (*)    All other components within normal limits  CSF CELL COUNT WITH DIFFERENTIAL - Abnormal; Notable for the following components:   RBC Count, CSF 8 (*)    All other components within normal limits  CSF CULTURE W GRAM STAIN  GRAM STAIN  GLUCOSE, CSF  PROTEIN, CSF    Notable for mild anemia    Imaging Studies ordered: I ordered imaging studies including CT head On my interpretation imaging demonstrates no acute process I independently visualized and interpreted imaging. I agree with the radiologist interpretation   Medicines ordered and prescription drug management: Meds ordered this encounter  Medications   sodium chloride 0.9 % bolus 1,000 mL   ketorolac (TORADOL) 15 MG/ML injection 15 mg    prochlorperazine (COMPAZINE) injection 10 mg   diphenhydrAMINE (BENADRYL) injection 12.5 mg   lidocaine (PF) (XYLOCAINE) 1 % injection 30 mL   lidocaine (PF) (XYLOCAINE) 1 % injection 5 mL    -I have reviewed the patients home medicines and have made adjustments as needed   Consultations Obtained: I requested consultation with the radiologist,  and discussed lab and imaging findings as well as pertinent plan - they recommend: fluoroscopic guidance for LP    Cardiac Monitoring: The patient was maintained on a cardiac monitor.  I personally viewed and interpreted the cardiac monitored which showed an underlying rhythm of: NSR  Social Determinants of Health:  Diagnosis or treatment significantly limited by social determinants of health: obesity   Reevaluation: After the interventions noted above, I reevaluated the patient and found that their symptoms have improved  Co morbidities that complicate the patient evaluation  Past Medical History:  Diagnosis Date   Anemia    Hx. Not currently (2024)   Anxiety    Complication of anesthesia    High anxiety r/t surgery   Depression    Gallstones    History of anemia    per pt on 02/02/2021   History of suicidal ideation 10/02/2019   Hx of wisdom tooth extraction    Hypertension    IIH (idiopathic intracranial hypertension)    Migraines    Related Intracraniel fluid   Morbid obesity (HCC)       Dispostion: Disposition decision including need for hospitalization was  considered, and patient discharged from emergency department.    Final Clinical Impression(s) / ED Diagnoses Final diagnoses:  IIH (idiopathic intracranial hypertension)     This chart was dictated using voice recognition software.  Despite best efforts to proofread,  errors can occur which can change the documentation meaning.    Lonell Grandchild, MD 04/13/23 2204

## 2023-04-13 NOTE — ED Provider Triage Note (Signed)
Emergency Medicine Provider Triage Evaluation Note  Tabitha Holland , a 37 y.o. female  was evaluated in triage.  Pt complains of worsening headache and vision loss in her left eye.  Patient has history of IIH and has a shunt surgery scheduled on Tuesday.  Her headaches have been worsening over the last 2 and half weeks.  She has had to have therapeutic LPs in the past.  She reports her last 1 was approximately a month ago.  She reached out to her neurosurgeon, but their office was closed.  She states losing her vision is her telling sign that the pressure in her head is high.    Review of Systems  Positive: As above Negative: As above  Physical Exam  BP (!) 159/114 (BP Location: Left Arm)   Pulse 60   Temp 98.2 F (36.8 C) (Oral)   Resp 16   Ht 5\' 11"  (1.803 m)   Wt (!) 167.8 kg   LMP 03/30/2023 (Exact Date)   SpO2 100%   BMI 51.60 kg/m  Gen:   Awake, no distress   Resp:  Normal effort  MSK:   Moves extremities without difficulty  Other:    Medical Decision Making  Medically screening exam initiated at 1:57 PM.  Appropriate orders placed.  Tabitha Holland was informed that the remainder of the evaluation will be completed by another provider, this initial triage assessment does not replace that evaluation, and the importance of remaining in the ED until their evaluation is complete.     Lenard Simmer, PA-C 04/13/23 1358

## 2023-04-13 NOTE — ED Triage Notes (Signed)
Pt arrives via POV. Pt reports hx of IIH. States her migraines have worsened over the past week. She is scheduled to have a VP shunt placed next week. Pt is AxOx4. She reports vision loss in her left eye.

## 2023-04-17 ENCOUNTER — Other Ambulatory Visit: Payer: Self-pay

## 2023-04-17 ENCOUNTER — Encounter (HOSPITAL_COMMUNITY)
Admission: RE | Disposition: A | Discharge status: Home / Self Care | Payer: Self-pay | Source: Home / Self Care | Attending: Neurosurgery

## 2023-04-17 ENCOUNTER — Encounter (HOSPITAL_COMMUNITY): Payer: Self-pay | Admitting: Neurosurgery

## 2023-04-17 ENCOUNTER — Inpatient Hospital Stay (HOSPITAL_COMMUNITY)
Admission: RE | Admit: 2023-04-17 | Discharge: 2023-04-18 | DRG: 032 | Disposition: A | Discharge status: Home / Self Care | Payer: Medicaid Other | Attending: Neurosurgery | Admitting: Neurosurgery

## 2023-04-17 ENCOUNTER — Inpatient Hospital Stay (HOSPITAL_COMMUNITY): Payer: Medicaid Other | Admitting: Physician Assistant

## 2023-04-17 ENCOUNTER — Inpatient Hospital Stay (HOSPITAL_COMMUNITY): Payer: Medicaid Other

## 2023-04-17 DIAGNOSIS — Z7985 Long-term (current) use of injectable non-insulin antidiabetic drugs: Secondary | ICD-10-CM

## 2023-04-17 DIAGNOSIS — F419 Anxiety disorder, unspecified: Secondary | ICD-10-CM | POA: Diagnosis present

## 2023-04-17 DIAGNOSIS — Z6841 Body Mass Index (BMI) 40.0 and over, adult: Secondary | ICD-10-CM | POA: Diagnosis not present

## 2023-04-17 DIAGNOSIS — Z91040 Latex allergy status: Secondary | ICD-10-CM

## 2023-04-17 DIAGNOSIS — F32A Depression, unspecified: Secondary | ICD-10-CM | POA: Diagnosis present

## 2023-04-17 DIAGNOSIS — Z79899 Other long term (current) drug therapy: Secondary | ICD-10-CM

## 2023-04-17 DIAGNOSIS — I10 Essential (primary) hypertension: Secondary | ICD-10-CM | POA: Diagnosis present

## 2023-04-17 DIAGNOSIS — Z9049 Acquired absence of other specified parts of digestive tract: Secondary | ICD-10-CM

## 2023-04-17 DIAGNOSIS — G932 Benign intracranial hypertension: Secondary | ICD-10-CM

## 2023-04-17 DIAGNOSIS — Z91013 Allergy to seafood: Secondary | ICD-10-CM

## 2023-04-17 DIAGNOSIS — F17210 Nicotine dependence, cigarettes, uncomplicated: Secondary | ICD-10-CM | POA: Diagnosis present

## 2023-04-17 DIAGNOSIS — Z885 Allergy status to narcotic agent status: Secondary | ICD-10-CM

## 2023-04-17 DIAGNOSIS — Z8249 Family history of ischemic heart disease and other diseases of the circulatory system: Secondary | ICD-10-CM

## 2023-04-17 DIAGNOSIS — Z818 Family history of other mental and behavioral disorders: Secondary | ICD-10-CM | POA: Diagnosis not present

## 2023-04-17 DIAGNOSIS — Z888 Allergy status to other drugs, medicaments and biological substances status: Secondary | ICD-10-CM

## 2023-04-17 DIAGNOSIS — Z01818 Encounter for other preprocedural examination: Secondary | ICD-10-CM

## 2023-04-17 HISTORY — PX: LAPAROSCOPIC REVISION VENTRICULAR-PERITONEAL (V-P) SHUNT: SHX5924

## 2023-04-17 HISTORY — PX: VENTRICULOPERITONEAL SHUNT: SHX204

## 2023-04-17 HISTORY — PX: APPLICATION OF CRANIAL NAVIGATION: SHX6578

## 2023-04-17 LAB — CSF CULTURE W GRAM STAIN: Gram Stain: NONE SEEN

## 2023-04-17 LAB — POCT PREGNANCY, URINE: Preg Test, Ur: NEGATIVE

## 2023-04-17 SURGERY — SHUNT INSERTION VENTRICULAR-PERITONEAL
Anesthesia: General

## 2023-04-17 MED ORDER — MIDAZOLAM HCL 2 MG/2ML IJ SOLN
1.0000 mg | Freq: Once | INTRAMUSCULAR | Status: AC | PRN
Start: 2023-04-17 — End: 2023-04-17

## 2023-04-17 MED ORDER — HYDROCODONE-ACETAMINOPHEN 5-325 MG PO TABS
1.0000 | ORAL_TABLET | ORAL | Status: DC | PRN
  Administered 2023-04-17 – 2023-04-18 (×2): 1 via ORAL
  Filled 2023-04-17 (×2): qty 1

## 2023-04-17 MED ORDER — TOPIRAMATE 25 MG PO TABS
200.0000 mg | ORAL_TABLET | Freq: Two times a day (BID) | ORAL | Status: DC
  Administered 2023-04-17: 200 mg via ORAL
  Filled 2023-04-17 (×2): qty 8

## 2023-04-17 MED ORDER — ACETAMINOPHEN 325 MG PO TABS: 650.0000 mg | ORAL_TABLET | ORAL | Status: DC | PRN

## 2023-04-17 MED ORDER — HYDRALAZINE HCL 20 MG/ML IJ SOLN
10.0000 mg | Freq: Once | INTRAMUSCULAR | Status: AC
  Administered 2023-04-17: 10 mg via INTRAVENOUS

## 2023-04-17 MED ORDER — ORAL CARE MOUTH RINSE: 15.0000 mL | Freq: Once | OROMUCOSAL | Status: AC

## 2023-04-17 MED ORDER — BUPIVACAINE HCL (PF) 0.5 % IJ SOLN
INTRAMUSCULAR | Status: DC | PRN
  Administered 2023-04-17: 3 mL

## 2023-04-17 MED ORDER — SUGAMMADEX SODIUM 200 MG/2ML IV SOLN
INTRAVENOUS | Status: DC | PRN
  Administered 2023-04-17 (×3): 100 mg via INTRAVENOUS

## 2023-04-17 MED ORDER — SODIUM CHLORIDE 0.9 % IV SOLN: INTRAVENOUS | Status: DC

## 2023-04-17 MED ORDER — KETOROLAC TROMETHAMINE 30 MG/ML IJ SOLN
INTRAMUSCULAR | Status: AC
  Filled 2023-04-17: qty 1

## 2023-04-17 MED ORDER — ONDANSETRON HCL 4 MG PO TABS: 4.0000 mg | ORAL_TABLET | ORAL | Status: DC | PRN

## 2023-04-17 MED ORDER — LIDOCAINE-EPINEPHRINE 1 %-1:100000 IJ SOLN
INTRAMUSCULAR | Status: DC | PRN
  Administered 2023-04-17: 5 mL

## 2023-04-17 MED ORDER — ROCURONIUM BROMIDE 10 MG/ML (PF) SYRINGE
PREFILLED_SYRINGE | INTRAVENOUS | Status: DC | PRN
  Administered 2023-04-17: 10 mg via INTRAVENOUS
  Administered 2023-04-17: 80 mg via INTRAVENOUS

## 2023-04-17 MED ORDER — KETOROLAC TROMETHAMINE 30 MG/ML IJ SOLN
30.0000 mg | Freq: Once | INTRAMUSCULAR | Status: AC | PRN
  Administered 2023-04-17: 30 mg via INTRAVENOUS

## 2023-04-17 MED ORDER — ROCURONIUM BROMIDE 10 MG/ML (PF) SYRINGE
PREFILLED_SYRINGE | INTRAVENOUS | Status: AC
  Filled 2023-04-17: qty 10

## 2023-04-17 MED ORDER — ACETAMINOPHEN 650 MG RE SUPP: 650.0000 mg | RECTAL | Status: DC | PRN

## 2023-04-17 MED ORDER — HYDRALAZINE HCL 20 MG/ML IJ SOLN
INTRAMUSCULAR | Status: AC
  Filled 2023-04-17: qty 1

## 2023-04-17 MED ORDER — CHLORHEXIDINE GLUCONATE 0.12 % MT SOLN
15.0000 mL | Freq: Once | OROMUCOSAL | Status: AC
  Administered 2023-04-17: 15 mL via OROMUCOSAL
  Filled 2023-04-17: qty 15

## 2023-04-17 MED ORDER — AMLODIPINE BESYLATE 5 MG PO TABS
5.0000 mg | ORAL_TABLET | Freq: Every day | ORAL | Status: DC
  Administered 2023-04-18: 5 mg via ORAL
  Filled 2023-04-17 (×2): qty 1

## 2023-04-17 MED ORDER — AMISULPRIDE (ANTIEMETIC) 5 MG/2ML IV SOLN: 10.0000 mg | Freq: Once | INTRAVENOUS | Status: DC | PRN

## 2023-04-17 MED ORDER — DEXAMETHASONE SODIUM PHOSPHATE 10 MG/ML IJ SOLN
INTRAMUSCULAR | Status: AC
  Filled 2023-04-17: qty 1

## 2023-04-17 MED ORDER — PHENYLEPHRINE 80 MCG/ML (10ML) SYRINGE FOR IV PUSH (FOR BLOOD PRESSURE SUPPORT)
PREFILLED_SYRINGE | INTRAVENOUS | Status: AC
  Filled 2023-04-17: qty 10

## 2023-04-17 MED ORDER — ACETAZOLAMIDE 250 MG PO TABS
1000.0000 mg | ORAL_TABLET | Freq: Two times a day (BID) | ORAL | Status: DC
  Administered 2023-04-17 – 2023-04-18 (×2): 1000 mg via ORAL
  Filled 2023-04-17 (×3): qty 4

## 2023-04-17 MED ORDER — FENTANYL CITRATE (PF) 100 MCG/2ML IJ SOLN
25.0000 ug | INTRAMUSCULAR | Status: DC | PRN
  Administered 2023-04-17: 50 ug via INTRAVENOUS

## 2023-04-17 MED ORDER — LIDOCAINE-EPINEPHRINE 1 %-1:100000 IJ SOLN
INTRAMUSCULAR | Status: AC
  Filled 2023-04-17: qty 1

## 2023-04-17 MED ORDER — CHLORHEXIDINE GLUCONATE CLOTH 2 % EX PADS
6.0000 | MEDICATED_PAD | Freq: Once | CUTANEOUS | Status: DC
Start: 2023-04-17 — End: 2023-04-17

## 2023-04-17 MED ORDER — LABETALOL HCL 5 MG/ML IV SOLN: 10.0000 mg | INTRAVENOUS | Status: DC | PRN

## 2023-04-17 MED ORDER — PHENYLEPHRINE 80 MCG/ML (10ML) SYRINGE FOR IV PUSH (FOR BLOOD PRESSURE SUPPORT)
PREFILLED_SYRINGE | INTRAVENOUS | Status: DC | PRN
  Administered 2023-04-17: 120 ug via INTRAVENOUS
  Administered 2023-04-17: 160 ug via INTRAVENOUS

## 2023-04-17 MED ORDER — ACETAMINOPHEN 500 MG PO TABS
1000.0000 mg | ORAL_TABLET | Freq: Once | ORAL | Status: AC
Start: 2023-04-17 — End: 2023-04-17
  Administered 2023-04-17: 1000 mg via ORAL
  Filled 2023-04-17: qty 2

## 2023-04-17 MED ORDER — DOUBLE ANTIBIOTIC 500-10000 UNIT/GM EX OINT
TOPICAL_OINTMENT | CUTANEOUS | Status: AC
  Filled 2023-04-17: qty 28.4

## 2023-04-17 MED ORDER — FLUOXETINE HCL 20 MG PO CAPS
40.0000 mg | ORAL_CAPSULE | Freq: Every day | ORAL | Status: DC
  Administered 2023-04-18: 40 mg via ORAL
  Filled 2023-04-17 (×2): qty 2

## 2023-04-17 MED ORDER — THROMBIN 5000 UNITS EX SOLR
CUTANEOUS | Status: AC
  Filled 2023-04-17: qty 5000

## 2023-04-17 MED ORDER — PROMETHAZINE HCL 12.5 MG PO TABS: 12.5000 mg | ORAL_TABLET | ORAL | Status: DC | PRN

## 2023-04-17 MED ORDER — HYDROCHLOROTHIAZIDE 25 MG PO TABS
25.0000 mg | ORAL_TABLET | Freq: Every day | ORAL | Status: DC
  Administered 2023-04-18: 25 mg via ORAL
  Filled 2023-04-17 (×2): qty 1

## 2023-04-17 MED ORDER — FENTANYL CITRATE (PF) 100 MCG/2ML IJ SOLN
INTRAMUSCULAR | Status: AC
  Filled 2023-04-17: qty 2

## 2023-04-17 MED ORDER — ONDANSETRON HCL 4 MG/2ML IJ SOLN
INTRAMUSCULAR | Status: DC | PRN
  Administered 2023-04-17: 4 mg via INTRAVENOUS

## 2023-04-17 MED ORDER — PROPOFOL 10 MG/ML IV BOLUS
INTRAVENOUS | Status: AC
  Filled 2023-04-17: qty 20

## 2023-04-17 MED ORDER — BUPIVACAINE-EPINEPHRINE (PF) 0.25% -1:200000 IJ SOLN
INTRAMUSCULAR | Status: AC
  Filled 2023-04-17: qty 30

## 2023-04-17 MED ORDER — MIDAZOLAM HCL 2 MG/2ML IJ SOLN
INTRAMUSCULAR | Status: AC
  Administered 2023-04-17: 1 mg via INTRAVENOUS
  Filled 2023-04-17: qty 2

## 2023-04-17 MED ORDER — IRBESARTAN 300 MG PO TABS
300.0000 mg | ORAL_TABLET | Freq: Every day | ORAL | Status: DC
  Administered 2023-04-17 – 2023-04-18 (×2): 300 mg via ORAL
  Filled 2023-04-17 (×2): qty 1

## 2023-04-17 MED ORDER — FENTANYL CITRATE (PF) 250 MCG/5ML IJ SOLN
INTRAMUSCULAR | Status: DC | PRN
  Administered 2023-04-17: 50 ug via INTRAVENOUS
  Administered 2023-04-17: 150 ug via INTRAVENOUS

## 2023-04-17 MED ORDER — SPIRONOLACTONE 25 MG PO TABS
50.0000 mg | ORAL_TABLET | Freq: Every day | ORAL | Status: DC
  Administered 2023-04-18: 50 mg via ORAL
  Filled 2023-04-17 (×2): qty 2

## 2023-04-17 MED ORDER — DEXAMETHASONE SODIUM PHOSPHATE 10 MG/ML IJ SOLN
INTRAMUSCULAR | Status: DC | PRN
  Administered 2023-04-17: 10 mg via INTRAVENOUS

## 2023-04-17 MED ORDER — 0.9 % SODIUM CHLORIDE (POUR BTL) OPTIME
TOPICAL | Status: DC | PRN
  Administered 2023-04-17: 1000 mL

## 2023-04-17 MED ORDER — METHOCARBAMOL 500 MG PO TABS
500.0000 mg | ORAL_TABLET | Freq: Three times a day (TID) | ORAL | Status: DC | PRN
  Administered 2023-04-17 – 2023-04-18 (×2): 500 mg via ORAL
  Filled 2023-04-17 (×2): qty 1

## 2023-04-17 MED ORDER — BUPROPION HCL ER (XL) 150 MG PO TB24
150.0000 mg | ORAL_TABLET | Freq: Every day | ORAL | Status: DC
  Administered 2023-04-18: 150 mg via ORAL
  Filled 2023-04-17 (×2): qty 1

## 2023-04-17 MED ORDER — OLMESARTAN-AMLODIPINE-HCTZ 40-5-25 MG PO TABS: 1.0000 | ORAL_TABLET | Freq: Every day | ORAL | Status: DC

## 2023-04-17 MED ORDER — SODIUM CHLORIDE 0.9 % IV SOLN
3.0000 g | INTRAVENOUS | Status: AC
  Administered 2023-04-17: 3 g via INTRAVENOUS
  Filled 2023-04-17: qty 3

## 2023-04-17 MED ORDER — LIDOCAINE 2% (20 MG/ML) 5 ML SYRINGE
INTRAMUSCULAR | Status: AC
  Filled 2023-04-17: qty 5

## 2023-04-17 MED ORDER — MIDAZOLAM HCL 2 MG/2ML IJ SOLN
INTRAMUSCULAR | Status: AC
  Filled 2023-04-17: qty 2

## 2023-04-17 MED ORDER — ONDANSETRON HCL 4 MG/2ML IJ SOLN
INTRAMUSCULAR | Status: AC
  Filled 2023-04-17: qty 2

## 2023-04-17 MED ORDER — OXYCODONE HCL 5 MG/5ML PO SOLN: 5.0000 mg | Freq: Once | ORAL | Status: DC | PRN

## 2023-04-17 MED ORDER — MIDAZOLAM HCL 2 MG/2ML IJ SOLN
INTRAMUSCULAR | Status: DC | PRN
  Administered 2023-04-17: 2 mg via INTRAVENOUS

## 2023-04-17 MED ORDER — BUPIVACAINE HCL (PF) 0.5 % IJ SOLN
INTRAMUSCULAR | Status: AC
  Filled 2023-04-17: qty 30

## 2023-04-17 MED ORDER — CHLORHEXIDINE GLUCONATE CLOTH 2 % EX PADS: 6.0000 | MEDICATED_PAD | Freq: Once | CUTANEOUS | Status: DC

## 2023-04-17 MED ORDER — ONDANSETRON HCL 4 MG/2ML IJ SOLN
4.0000 mg | INTRAMUSCULAR | Status: DC | PRN
  Administered 2023-04-17 – 2023-04-18 (×2): 4 mg via INTRAVENOUS
  Filled 2023-04-17 (×2): qty 2

## 2023-04-17 MED ORDER — OXYCODONE HCL 5 MG PO TABS: 5.0000 mg | ORAL_TABLET | Freq: Once | ORAL | Status: DC | PRN

## 2023-04-17 MED ORDER — FENTANYL CITRATE (PF) 250 MCG/5ML IJ SOLN
INTRAMUSCULAR | Status: AC
  Filled 2023-04-17: qty 5

## 2023-04-17 MED ORDER — MORPHINE SULFATE (PF) 2 MG/ML IV SOLN
1.0000 mg | INTRAVENOUS | Status: DC | PRN
  Administered 2023-04-17 – 2023-04-18 (×5): 2 mg via INTRAVENOUS
  Filled 2023-04-17 (×5): qty 1

## 2023-04-17 MED ORDER — PROPOFOL 10 MG/ML IV BOLUS
INTRAVENOUS | Status: DC | PRN
  Administered 2023-04-17: 200 mg via INTRAVENOUS

## 2023-04-17 MED ORDER — SUCCINYLCHOLINE CHLORIDE 200 MG/10ML IV SOSY
PREFILLED_SYRINGE | INTRAVENOUS | Status: AC
  Filled 2023-04-17: qty 10

## 2023-04-17 MED ORDER — FAMOTIDINE 20 MG PO TABS
20.0000 mg | ORAL_TABLET | Freq: Every day | ORAL | Status: DC | PRN
  Administered 2023-04-18: 20 mg via ORAL
  Filled 2023-04-17: qty 1

## 2023-04-17 MED ORDER — LIDOCAINE 2% (20 MG/ML) 5 ML SYRINGE
INTRAMUSCULAR | Status: DC | PRN
  Administered 2023-04-17: 100 mg via INTRAVENOUS

## 2023-04-17 SURGICAL SUPPLY — 56 items
BAG COUNTER SPONGE SURGICOUNT (BAG) ×2 IMPLANT
BLADE CLIPPER SURG (BLADE) ×4 IMPLANT
BLADE SURG 11 STRL SS (BLADE) ×4 IMPLANT
BUR PRECISION FLUTE 5.0 (BURR) ×2 IMPLANT
CANISTER SUCT 3000ML PPV (MISCELLANEOUS) ×2 IMPLANT
CATH VENTRICULAR 8 (Shunt) IMPLANT
CLAMP SUTURE YELLOW 5 PAIRS (MISCELLANEOUS) IMPLANT
CLIP RANEY DISP (INSTRUMENTS) IMPLANT
COVERAGE SUPPORT O-ARM STEALTH (MISCELLANEOUS) ×2 IMPLANT
DERMABOND ADVANCED .7 DNX12 (GAUZE/BANDAGES/DRESSINGS) IMPLANT
DRAPE HALF SHEET 40X57 (DRAPES) ×2 IMPLANT
DRAPE INCISE IOBAN 66X45 STRL (DRAPES) ×2 IMPLANT
DRAPE SURG ORHT 6 SPLT 77X108 (DRAPES) ×4 IMPLANT
DRSG OPSITE POSTOP 3X4 (GAUZE/BANDAGES/DRESSINGS) IMPLANT
DURAPREP 26ML APPLICATOR (WOUND CARE) ×4 IMPLANT
ELECT REM PT RETURN 9FT ADLT (ELECTROSURGICAL) ×2 IMPLANT
ELECTRODE REM PT RTRN 9FT ADLT (ELECTROSURGICAL) ×2 IMPLANT
FEE COVERAGE SUPPORT O-ARM (MISCELLANEOUS) IMPLANT
GAUZE 4X4 16PLY ~~LOC~~+RFID DBL (SPONGE) IMPLANT
GLOVE BIOGEL PI IND STRL 7.5 (GLOVE) ×2 IMPLANT
GLOVE ECLIPSE 7.0 STRL STRAW (GLOVE) ×4 IMPLANT
GLOVE EXAM NITRILE XL STR (GLOVE) IMPLANT
GOWN STRL REUS W/ TWL LRG LVL3 (GOWN DISPOSABLE) ×4 IMPLANT
GOWN STRL REUS W/ TWL XL LVL3 (GOWN DISPOSABLE) IMPLANT
GOWN STRL REUS W/TWL 2XL LVL3 (GOWN DISPOSABLE) IMPLANT
HEMOSTAT SURGICEL 2X14 (HEMOSTASIS) IMPLANT
KIT BASIN OR (CUSTOM PROCEDURE TRAY) ×2 IMPLANT
KIT TURNOVER KIT B (KITS) ×2 IMPLANT
MARKER SKIN DUAL TIP RULER LAB (MISCELLANEOUS) ×4 IMPLANT
NDL HYPO 25X1 1.5 SAFETY (NEEDLE) ×2 IMPLANT
NEEDLE HYPO 25X1 1.5 SAFETY (NEEDLE) ×2 IMPLANT
NS IRRIG 1000ML POUR BTL (IV SOLUTION) ×2 IMPLANT
PACK LAMINECTOMY NEURO (CUSTOM PROCEDURE TRAY) ×2 IMPLANT
PAD ARMBOARD 7.5X6 YLW CONV (MISCELLANEOUS) ×6 IMPLANT
PASSER CATH 65CM DISP (NEUROSURGERY SUPPLIES) ×2 IMPLANT
POINTER TRACER AXIEM (INSTRUMENTS) IMPLANT
SHEATH PERITONEAL INTRO 61 (SHEATH) ×2 IMPLANT
SHUNT STRATA 11 SNAP REG (Shunt) IMPLANT
SPIKE FLUID TRANSFER (MISCELLANEOUS) ×2 IMPLANT
SPONGE SURGIFOAM ABS GEL SZ50 (HEMOSTASIS) ×2 IMPLANT
SPONGE T-LAP 4X18 ~~LOC~~+RFID (SPONGE) IMPLANT
STAPLER SKIN PROX WIDE 3.9 (STAPLE) ×2 IMPLANT
STRIP CLOSURE SKIN 1/2X4 (GAUZE/BANDAGES/DRESSINGS) IMPLANT
STYLET 2 COIL SINGLE (INSTRUMENTS) IMPLANT
SUT ETHILON 3 0 PS 1 (SUTURE) IMPLANT
SUT NURALON 4 0 TR CR/8 (SUTURE) IMPLANT
SUT SILK 0 100YDS SPOOL (SUTURE) IMPLANT
SUT VIC AB 3-0 SH 27X BRD (SUTURE) ×2 IMPLANT
SUT VIC AB 3-0 SH 8-18 (SUTURE) ×4 IMPLANT
TAG SUTURE CLAMP YLW 5PR (MISCELLANEOUS) IMPLANT
TOWEL GREEN STERILE (TOWEL DISPOSABLE) ×4 IMPLANT
TOWEL GREEN STERILE FF (TOWEL DISPOSABLE) ×2 IMPLANT
TRACKER ENT PATIENT (MISCELLANEOUS) IMPLANT
TUBE CONNECTING 12X1/4 (SUCTIONS) ×2 IMPLANT
UNDERPAD 30X36 HEAVY ABSORB (UNDERPADS AND DIAPERS) ×2 IMPLANT
WATER STERILE IRR 1000ML POUR (IV SOLUTION) ×2 IMPLANT

## 2023-04-17 NOTE — H&P (Signed)
Chief Complaint   Headache  History of Present Illness  Tabitha Holland is a 37 year old woman I am seeing at the request of Dr. Teresa Coombs. She is referred for evaluation of possible idiopathic intracranial hypertension. Briefly, the patient reports headaches which have been going on for many years. She says initially she was treated for a diagnosis of migraines. She reports previous photophobia and phonophobia with her headaches. There was no particular aura. She actually describes relatively constant headaches. More recently in January of this year she was given a diagnosis of idiopathic intracranial hypertension. She did undergo a lumbar puncture earlier this year which she says provided about six months of relief of her headaches. A few months ago she had some recurrence of the headaches however has had multiple lumbar punctures since then with less effectiveness. More recently she says she underwent a lumbar puncture which provided essentially no benefit. Since the diagnosis in January she was also started on escalating doses of Diamox which she feels does not provide much improvement in her symptoms. She has also been seen by her ophthalmologist, Dr. Dione Booze, and tells me that she needs a prescription change for vision in her left eye.  After lengthy discussion with her neurologist, she underwent susequent LP which did provide some improvement in her headache.  We therefore elected to proceed with placement of a ventriculoperitoneal shunt.  Past Medical History   Past Medical History:  Diagnosis Date   Anemia    Hx. Not currently (2024)   Anxiety    Complication of anesthesia    High anxiety r/t surgery   Depression    Gallstones    History of anemia    per pt on 02/02/2021   History of suicidal ideation 10/02/2019   Hx of wisdom tooth extraction    Hypertension    IIH (idiopathic intracranial hypertension)    Migraines    Related Intracraniel fluid   Morbid obesity (HCC)     Past  Surgical History   Past Surgical History:  Procedure Laterality Date   CESAREAN SECTION N/A 10/28/2013   Procedure: CESAREAN SECTION;  Surgeon: Allie Bossier, MD;  Location: [facility];  Service: Obstetrics;  Laterality: N/A;   CESAREAN SECTION N/A 09/15/2015   Procedure: CESAREAN SECTION;  Surgeon: Catalina Antigua, MD;  Location: [facility];  Service: Obstetrics;  Laterality: N/A;   CESAREAN SECTION N/A 05/26/2018   Procedure: CESAREAN SECTION;  Surgeon: Adam Phenix, MD;  Location: [facility] BIRTHING SUITES;  Service: Obstetrics;  Laterality: N/A;   CHOLECYSTECTOMY     Early 2000s   DILATATION & CURETTAGE/HYSTEROSCOPY WITH MYOSURE N/A 02/09/2021   Procedure: DILATATION & CURETTAGE/HYSTEROSCOPY WITH MYOSURE;  Surgeon: Reva Bores, MD;  Location: [facility];  Service: Gynecology;  Laterality: N/A;  fibroid resection   WISDOM TOOTH EXTRACTION      Social History   Social History   Tobacco Use   Smoking status: Every Day    Current packs/day: 0.25    Types: Cigarettes   Smokeless tobacco: Never   Tobacco comments:    3 cigarettes per day as of 04/11/2023  Vaping Use   Vaping status: Never Used  Substance Use Topics   Alcohol use: Not Currently    Comment: Rarely   Drug use: Not Currently    Types: Marijuana    Medications   Prior to Admission medications   Medication Sig Start Date End Date Taking? Authorizing Provider  acetaZOLAMIDE (DIAMOX) 250 MG tablet Take 4 tablets (1,000  mg total) by mouth 2 (two) times daily. 01/29/23 05/29/23 Yes Camara, Amalia Hailey, MD  buPROPion (WELLBUTRIN XL) 150 MG 24 hr tablet Take 1 tablet (150 mg total) by mouth daily. 02/23/23  Yes [name], MD  celecoxib (CELEBREX) 200 MG capsule Take 1 capsule (200 mg total) by mouth 2 (two) times daily. Patient not taking: Reported on 04/13/2023 03/16/23  Yes [name], MD  FLUoxetine (PROZAC) 40 MG capsule Take 1 capsule (40 mg total) by mouth daily. 02/23/23  Yes [name], MD  LORazepam (ATIVAN) 1 MG tablet Take 1 tablet (1 mg total) by mouth 2 (two) times daily as needed for anxiety. TAKE 1 TABLET(1 MG) BY MOUTH TWICE DAILY AS NEEDED FOR ANXIETY OR PANIC Strength: 1 mg Patient taking differently: Take 1 mg by mouth 2 (two) times daily as needed for anxiety (or panic). 02/23/23  Yes [name], MD  Olmesartan-amLODIPine-HCTZ 40-5-25 MG TABS Take 1 tablet by mouth daily. 01/26/23  Yes [name], MD  Ruxolitinib Phosphate (OPZELURA) 1.5 % CREA Apply 1 Application topically daily as needed (eczema).   Yes [provider]  Semaglutide-Weight Management (WEGOVY) 0.25 MG/0.5ML SOAJ Inject 0.25 mg into the skin once a week. Patient not taking: Reported on 04/13/2023 02/23/23  Yes [name], MD  spironolactone (ALDACTONE) 50 MG tablet TAKE 1 TABLET(50 MG) BY MOUTH AT BEDTIME Patient taking differently: Take 50 mg by mouth daily. 08/30/22  Yes [name], MD  topiramate (TOPAMAX) 200 MG tablet Take 1 tablet (200 mg total) by mouth 2 (two) times daily. 01/29/23 08/27/23 Yes [name], MD  famotidine (PEPCID) 20 MG tablet Take 20 mg by mouth daily as needed for heartburn or indigestion.    [provider]  FLUoxetine (PROZAC) 20 MG capsule TAKE 1 CAPSULE BY MOUTH EVERY DAY Patient not taking: Reported on 04/13/2023 02/26/23   [name], MD  methocarbamol (ROBAXIN) 500 MG tablet Take 500 mg by mouth every 8 (eight) hours as needed for muscle spasms.    [provider]  polyethylene glycol (MIRALAX / GLYCOLAX) 17 g packet Take 17 g by mouth daily. Patient not taking: Reported on 04/13/2023 01/05/23   Rolly Salter, MD  TYLENOL 500 MG tablet Take 500-1,000 mg by mouth every 6 (six) hours as needed for mild pain (pain score 1-3) or headache.    [provider]    Allergies   Allergies  Allergen Reactions   Butalbital Itching   Codeine Other (See Comments)    Migraines    Fish Allergy Itching    Hydrocodone Other (See Comments)    Migraines    Latex Itching   Percocet [Oxycodone-Acetaminophen] Other (See Comments)    Migraines    Shellfish Allergy Itching    Review of Systems  ROS  Neurologic Exam  Awake, alert, oriented Memory and concentration grossly intact Speech fluent, appropriate CN grossly intact Motor exam: Upper Extremities Deltoid Bicep Tricep Grip  Right 5/5 5/5 5/5 5/5  Left 5/5 5/5 5/5 5/5   Lower Extremities IP Quad PF DF EHL  Right 5/5 5/5 5/5 5/5 5/5  Left 5/5 5/5 5/5 5/5 5/5   Sensation grossly intact to LT  Impression  - 37 y.o. female with likely multifactorial chronic headaches, possibly harshly related to idiopathic intracranial hypertension.  She has previously responded somewhat to high-volume lumbar puncture.  Plan  -We will plan on placement of a right sided frontal ventriculoperitoneal shunt with laparoscopic assistance  I have reviewed the indications for the procedure as well as the details of the procedure and the expected postoperative course and recovery at length with the patient in the office. We have also reviewed in detail the risks, benefits, and alternatives to the procedure. All questions were answered and Tabitha Holland provided informed consent to proceed.  Lisbeth Renshaw, MD [facility]

## 2023-04-17 NOTE — H&P (Signed)
**Note Tabitha-Identified via Obfuscation** Reason for Consult:VP shunt creation Referring Provider: Lisbeth Holland  Tabitha Holland is an 37 y.o. female.  HPI: 37 yo female with intracranial hypertension presenting for VP shunt  Past Medical History:  Diagnosis Date   Anemia    Hx. Not currently (2024)   Anxiety    Complication of anesthesia    High anxiety r/t surgery   Depression    Gallstones    History of anemia    per pt on 02/02/2021   History of suicidal ideation 10/02/2019   Hx of wisdom tooth extraction    Hypertension    IIH (idiopathic intracranial hypertension)    Migraines    Related Intracraniel fluid   Morbid obesity (HCC)     Past Surgical History:  Procedure Laterality Date   CESAREAN SECTION N/A 10/28/2013   Procedure: CESAREAN SECTION;  Surgeon: Allie Bossier, MD;  Location: [facility];  Service: Obstetrics;  Laterality: N/A;   CESAREAN SECTION N/A 09/15/2015   Procedure: CESAREAN SECTION;  Surgeon: Catalina Antigua, MD;  Location: [facility];  Service: Obstetrics;  Laterality: N/A;   CESAREAN SECTION N/A 05/26/2018   Procedure: CESAREAN SECTION;  Surgeon: Adam Phenix, MD;  Location: [facility];  Service: Obstetrics;  Laterality: N/A;   CHOLECYSTECTOMY     Early 2000s   DILATATION & CURETTAGE/HYSTEROSCOPY WITH MYOSURE N/A 02/09/2021   Procedure: DILATATION & CURETTAGE/HYSTEROSCOPY WITH MYOSURE;  Surgeon: Reva Bores, MD;  Location: [facility];  Service: Gynecology;  Laterality: N/A;  fibroid resection   WISDOM TOOTH EXTRACTION      Family History  Adopted: Yes  Problem Relation Age of Onset   Hypertension Sister    Bipolar disorder Brother    Schizophrenia Brother     Social History:  reports that she has been smoking cigarettes. She has never used smokeless tobacco. She reports that she does not currently use alcohol. She reports that she does not currently use drugs after having used the following drugs: Marijuana.  Allergies:  Allergies   Allergen Reactions   Butalbital Itching   Codeine Other (See Comments)    Migraines    Fish Allergy Itching   Hydrocodone Other (See Comments)    Migraines    Latex Itching   Percocet [Oxycodone-Acetaminophen] Other (See Comments)    Migraines    Shellfish Allergy Itching    Medications: I have reviewed the patient's current medications.  Results for orders placed or performed during the hospital encounter of 04/17/23 (from the past 48 hours)  Pregnancy, urine POC     Status: None   Collection Time: 04/17/23 12:09 PM  Result Value Ref Range   Preg Test, Ur NEGATIVE NEGATIVE    Comment:        THE SENSITIVITY OF THIS METHODOLOGY IS >24 mIU/mL     No results found.  Review of Systems  Constitutional: Negative.   HENT: Negative.    Eyes: Negative.   Respiratory: Negative.    Cardiovascular: Negative.   Gastrointestinal: Negative.   Genitourinary: Negative.   Musculoskeletal: Negative.   Skin: Negative.   Neurological: Negative.   Endo/Heme/Allergies: Negative.   Psychiatric/Behavioral: Negative.      PE Blood pressure (!) 145/86, pulse (!) 57, temperature 97.8 F (36.6 C), temperature source Oral, resp. rate 18, height 5\' 11"  (1.803 m), weight (!) 167.8 kg, last menstrual period 03/30/2023, SpO2 99%. Constitutional: NAD; conversant; no deformities Eyes: Moist conjunctiva; no lid lag; anicteric; PERRL Neck: Trachea midline; no thyromegaly Lungs:  Normal respiratory effort; no tactile fremitus CV: RRR; no palpable thrills; no pitting edema GI: Abd soft, NT; no palpable hepatosplenomegaly MSK: Normal gait; no clubbing/cyanosis Psychiatric: Appropriate affect; alert and oriented x3 Lymphatic: No palpable cervical or axillary lymphadenopathy Skin: No major subcutaneous nodules. Warm and dry   Assessment/Plan: 37 yo female with ICP -laparoscopic access to assist with VP shunt  I reviewed last 24 h vitals and pain scores, last 48 h intake and output, last 24 h labs  and trends, and last 24 h imaging results.  This care required high  level of medical decision making.   Tabitha Holland 04/17/2023, 12:46 PM

## 2023-04-17 NOTE — Op Note (Signed)
NEUROSURGERY OPERATIVE NOTE   PREOP DIAGNOSIS: Benign idiopathic intracranial hypertension  POSTOP DIAGNOSIS: Same  PROCEDURE: 1. Laparoscopic Assisted ventriculoperitoneal shunt placement 2. Use of intraoperative stereotactic navigation  SURGEON: Dr. Lisbeth Renshaw, MD  CO-SURGEON: Dr. Axel Filler, MD  ANESTHESIA: General Endotracheal  EBL: Minimal  SPECIMENS: None  DRAINS: None  COMPLICATIONS: None immediate  CONDITION: Stable to PACU  SHUNT PLACED: Medtronic Strata II, set to 2.5  HISTORY: Tabitha Holland is a 37 y.o. female initially presenting to the outpatient neurosurgery clinic with headaches as a referral from her neurologist.  She had undergone multiple previous lumbar punctures with some improvement in symptoms.  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  She has also been on Diamox with minimal improvement.  She therefore elected to proceed with placement of ventriculoperitoneal shunt for the treatment of her idiopathic intracranial hypertension.  Risks, benefits, and alternatives to surgery as well as the expected postoperative course and recovery and likely outcomes of surgery were all reviewed in detail with the patient and her family.  After all questions were answered informed consent was obtained and witnessed.  PROCEDURE IN DETAIL: The patient was brought to the operating room and transferred to the operative table. After induction of general anesthesia, the patient was positioned on the operative table in the supine position with all pressure points meticulously padded.  The preoperative CT scan was then co-registered using the electromagnetic stereotactic navigation system.  An excellent accuracy less than 2 mm was achieved.  A target at the right frontal horn was then created on the preoperative CT scan.  The skin of the scalp,  neck, chest, and abdomen were prepped in the usual sterile fashion.  After timeout was conducted,  the curvilinear right frontal skin incision over Kocher's point was infiltrated with local anesthetic with epinephrine.  Incision was then made sharply and carried down through the galea.  Hemostasis was secured.  High-speed drill was used to create a bur hole over Kocher's point.  The dura was then coagulated and incised.  The probe was then used to select the dural opening as the entry point for the shunt catheter.  At this point, the shunt assembly was passed into the retroauricular region with a small counterincision.  The valve was seated subcutaneously.  Shunt passer was then used to pass the distal catheter from the retroauricular incision to the epigastrium which did require creation of a small counterincision in the right infra clavicular region.  The distal portion of the catheter was then placed into the peritoneal cavity under laparoscopic guidance by Dr. Derrell Lolling, the details of which are dictated in a separate report.  At this point, utilizing the navigated stylette, an 8 cm ventricular catheter was passed into the right frontal horn in in a single attempt.  Good clear CSF flow was obtained. The catheter was then connected to the valve apparatus.  The valve was then pumped, and good distal flow was observed through the laparoscope and the peritoneal cavity.  At this point the scalp wounds were irrigated with copious amounts of bacitracin irrigation, and closed using 3-0 Vicryl stitches.  The skin was then closed using standard surgical skin staples.  Sterile dressings were then applied.  At the end of the case all sponge needle and instrument counts were correct.  The patient tolerated the procedure well and was extubated in the room and taken to the postanesthesia care unit in stable condition.   Lisbeth Renshaw, MD [facility]

## 2023-04-17 NOTE — Transfer of Care (Signed)
Immediate Anesthesia Transfer of Care Note  Patient: Tabitha Holland  Procedure(s) Performed: LAPAROSCOPIC ASSISTED VENTRICULOPERITONEAL SHUNT PLACEMENT APPLICATION OF CRANIAL NAVIGATION LAPAROSCOPIC VENTRICULAR-PERITONEAL (V-P) SHUNT PLACEMENT  Patient Location: PACU  Anesthesia Type:General  Level of Consciousness: awake, alert , and oriented  Airway & Oxygen Therapy: Patient Spontanous Breathing  Post-op Assessment: Report given to RN and Post -op Vital signs reviewed and stable  Post vital signs: Reviewed and stable  Last Vitals:  Vitals Value Taken Time  BP 161/107 04/17/23 1853  Temp 36.5 C 04/17/23 1851  Pulse 77 04/17/23 1857  Resp 28 04/17/23 1857  SpO2 98 % 04/17/23 1857  Vitals shown include unfiled device data.  Last Pain:  Vitals:   04/17/23 1216  TempSrc: Oral  PainSc:          Complications: No notable events documented.

## 2023-04-17 NOTE — Op Note (Signed)
04/17/2023  6:29 PM  PATIENT:  Tabitha Holland  37 y.o. female  PRE-OPERATIVE DIAGNOSIS:  PSEUDOTUMOR CEREBRI  POST-OPERATIVE DIAGNOSIS:  PSEUDOTUMOR CEREBRI  PROCEDURE:  Procedure(s) with comments: LAPAROSCOPIC ASSISTED VENTRICULOPERITONEAL SHUNT PLACEMENT (N/A) - 80M   SURGEON:  Surgeons and Role: Panel 1:  Axel Filler, MD - Primary Panel 2:    Lisbeth Renshaw, MD - Primary  ASSISTANTS: Jeronimo Greaves, RNFA   ANESTHESIA:   local and general  EBL:  minimal   BLOOD ADMINISTERED:none  DRAINS: Ventriculostomy Drain in the pelvis   LOCAL MEDICATIONS USED:  BUPIVICAINE   SPECIMEN:  No Specimen  DISPOSITION OF SPECIMEN:  N/A  COUNTS:  YES  TOURNIQUET:  * No tourniquets in log *  DICTATION: Reubin Milan Dictation   Details procedure: This surgery was done in conjunction with Dr. Conchita Paris of neurosurgery. He will dictate his portion under separate cover.  After the patient was consented she was taken back to the OR and placed in the supine position with bilateral SCDs in place. Patient underwent general anesthesia. Patient was prepped and draped in the standard fashion. A timeout was called all facts verified.  A Veress needle technique was used to insufflate the abdomen to 15 mmHg the left subcostal margin. Subsequent a 5mm, trocar and camera placed intra-abdominally. There was no injury to intra-abdominal organs. There was some thin omental adhesions to the periumbilical area.  These were taken down with sharp dissection.  A second 5 mm trochars placed in the left lower quadrant direct visualization.   An area of the epigastrum was chosen for the tunneling of the VP shunt. This was the area that was used to help the insertion of the VP shunt into the abdominal cavity. Once the catheter was brought into the abdominal cavity this was placed into the pelvis and direct visualization. There were no loops and the catheter lay flat on the viscera.  CSF fluid could be seen  easily exiting the catheter in the pelvis.  At this time the insufflation was evacuated. All trochars removed. The skin was reapproximated all trocar sites using 4-0 Monocryl subcuticular fashion. The skin was dressed with dermabond. The patient tolerated procedure well was taken to recovery in stable condition.     PLAN OF CARE: Per Dr. Conchita Paris  PATIENT DISPOSITION:  PACU - hemodynamically stable.   Delay start of Pharmacological VTE agent (>24hrs) due to surgical blood loss or risk of bleeding: yes

## 2023-04-17 NOTE — Anesthesia Procedure Notes (Signed)
Procedure Name: Intubation Date/Time: 04/17/2023 5:21 PM  Performed by: Darlina Guys, CRNAPre-anesthesia Checklist: Patient identified, Emergency Drugs available, Suction available and Patient being monitored Patient Re-evaluated:Patient Re-evaluated prior to induction Oxygen Delivery Method: Circle System Utilized Preoxygenation: Pre-oxygenation with 100% oxygen Induction Type: IV induction Ventilation: Mask ventilation without difficulty Laryngoscope Size: Glidescope and 3 Grade View: Grade I Tube type: Oral Number of attempts: 1 Airway Equipment and Method: Stylet and Oral airway Placement Confirmation: ETT inserted through vocal cords under direct vision, positive ETCO2 and breath sounds checked- equal and bilateral Secured at: 22 cm Tube secured with: Tape Dental Injury: Teeth and Oropharynx as per pre-operative assessment  Comments: Easy mask. G/S used. Atraumatic intubation. Dentition undamaged and as per preop

## 2023-04-18 ENCOUNTER — Other Ambulatory Visit (HOSPITAL_COMMUNITY): Payer: Self-pay

## 2023-04-18 MED ORDER — ORAL CARE MOUTH RINSE: 15.0000 mL | OROMUCOSAL | Status: DC | PRN

## 2023-04-18 MED ORDER — TRAMADOL HCL 50 MG PO TABS
50.0000 mg | ORAL_TABLET | Freq: Four times a day (QID) | ORAL | 0 refills | Status: AC | PRN
  Filled 2023-04-18: qty 20, 5d supply, fill #0

## 2023-04-18 MED ORDER — HYDROCODONE-ACETAMINOPHEN 5-325 MG PO TABS
1.0000 | ORAL_TABLET | Freq: Four times a day (QID) | ORAL | 0 refills | Status: DC | PRN
  Filled 2023-04-18: qty 20, 5d supply, fill #0

## 2023-04-18 NOTE — Progress Notes (Signed)
Pt's primary RN had reached out to pt's surgeon concerning need to change pain medication to something else, as pt vomited the hydrocodone medication this morning when MD rounded and pt felt she has an intolerance to the hydrocodone.   Once medication was changed, TOC pharmacy aware of this change and working on filling new script and AVS printed with new script information.   Discharge instructions reviewed with pt and family member at bedside.  Copy of instructions given to pt. [facility] TOC pharmacy filling script and will be picked up prior to pt discharging. Pt instructed to get dressed and medication would be ready shortly.  Pt to be d/c'd via wheelchair with belongings, with family member and will be  escorted by staff.   Charnel Giles,RN SWOT

## 2023-04-18 NOTE — Progress Notes (Signed)
Pt refused all medications that she normally takes in the morning time at home, they were scheduled for 2200 after surgery, educated on reasoning, she stated she still would like to wait and take them in the morning   Will continue care

## 2023-04-18 NOTE — Discharge Summary (Signed)
Physician Discharge Summary  Patient ID: Tabitha Holland MRN: [id number] DOB/AGE: Jan 07, 1986 37 y.o.  Admit date: 04/17/2023 Discharge date: 04/18/2023  Admission Diagnoses:  Pseudotumor cerebri  Discharge Diagnoses:  Same Principal Problem:   Pseudotumor cerebri Active Problems:   Idiopathic intracranial hypertension   Discharged Condition: Stable  Hospital Course:  Tabitha Holland is a 37 y.o. female admitted after elective placement of right VP shunt. She was at baseline postop day 1 and discharged home in stable condition  Treatments: Surgery - lap assisted right VP shunt placement  Discharge Exam: Blood pressure (!) 155/80, pulse 67, temperature 98.8 F (37.1 C), temperature source Oral, resp. rate 16, height 5\' 11"  (1.803 m), weight (!) 165 kg, last menstrual period 03/30/2023, SpO2 98%. Awake, alert, oriented Speech fluent, appropriate CN grossly intact 5/5 BUE/BLE Wound c/d/i  Disposition: Discharge disposition: 01-Home or Self Care       Discharge Instructions     Call MD for:  redness, tenderness, or signs of infection (pain, swelling, redness, odor or green/yellow discharge around incision site)   Complete by: As directed    Call MD for:  temperature >100.4   Complete by: As directed    Diet - low sodium heart healthy   Complete by: As directed    Discharge instructions   Complete by: As directed    Walk at home as much as possible, at least 4 times / day   Increase activity slowly   Complete by: As directed    Lifting restrictions   Complete by: As directed    No lifting > 10 lbs   May shower / Bathe   Complete by: As directed    48 hours after surgery   May walk up steps   Complete by: As directed    Other Restrictions   Complete by: As directed    No bending/twisting at waist   Remove dressing in 24 hours   Complete by: As directed       Allergies as of 04/18/2023       Reactions   Butalbital Itching   Codeine Other (See  Comments)   Migraines    Fish Allergy Itching   Hydrocodone Other (See Comments)   Migraines    Latex Itching   Percocet [oxycodone-acetaminophen] Other (See Comments)   Migraines    Shellfish Allergy Itching        Medication List     TAKE these medications    acetaZOLAMIDE 250 MG tablet Commonly known as: DIAMOX Take 4 tablets (1,000 mg total) by mouth 2 (two) times daily.   buPROPion 150 MG 24 hr tablet Commonly known as: Wellbutrin XL Take 1 tablet (150 mg total) by mouth daily.   celecoxib 200 MG capsule Commonly known as: CeleBREX Take 1 capsule (200 mg total) by mouth 2 (two) times daily.   famotidine 20 MG tablet Commonly known as: PEPCID Take 20 mg by mouth daily as needed for heartburn or indigestion.   FLUoxetine 40 MG capsule Commonly known as: PROZAC Take 1 capsule (40 mg total) by mouth daily.   FLUoxetine 20 MG capsule Commonly known as: PROZAC TAKE 1 CAPSULE BY MOUTH EVERY DAY   HYDROcodone-acetaminophen 5-325 MG tablet Commonly known as: NORCO/VICODIN Take 1 tablet by mouth every 6 (six) hours as needed for up to 7 days for moderate pain (pain score 4-6).   LORazepam 1 MG tablet Commonly known as: ATIVAN Take 1 tablet (1 mg total) by mouth 2 (two) times daily as  needed for anxiety. TAKE 1 TABLET(1 MG) BY MOUTH TWICE DAILY AS NEEDED FOR ANXIETY OR PANIC Strength: 1 mg What changed:  reasons to take this additional instructions   methocarbamol 500 MG tablet Commonly known as: ROBAXIN Take 500 mg by mouth every 8 (eight) hours as needed for muscle spasms.   Olmesartan-amLODIPine-HCTZ 40-5-25 MG Tabs Take 1 tablet by mouth daily.   Opzelura 1.5 % Crea Generic drug: Ruxolitinib Phosphate Apply 1 Application topically daily as needed (eczema).   polyethylene glycol 17 g packet Commonly known as: MIRALAX / GLYCOLAX Take 17 g by mouth daily.   spironolactone 50 MG tablet Commonly known as: ALDACTONE TAKE 1 TABLET(50 MG) BY MOUTH AT  BEDTIME What changed: See the new instructions.   topiramate 200 MG tablet Commonly known as: Topamax Take 1 tablet (200 mg total) by mouth 2 (two) times daily.   TYLENOL 500 MG tablet Generic drug: acetaminophen Take 500-1,000 mg by mouth every 6 (six) hours as needed for mild pain (pain score 1-3) or headache.   Wegovy 0.25 MG/0.5ML Soaj Generic drug: Semaglutide-Weight Management Inject 0.25 mg into the skin once a week.        Follow-up Information     Lisbeth Renshaw, MD Follow up in 2 week(s).   Specialty: Neurosurgery Why: For staple removal Contact information: [address] [place] [address] [phone]                 Signed: Jackelyn Hoehn 04/18/2023, 11:11 AM

## 2023-04-18 NOTE — Anesthesia Postprocedure Evaluation (Signed)
Anesthesia Post Note  Patient: Tabitha Holland  Procedure(s) Performed: LAPAROSCOPIC ASSISTED VENTRICULOPERITONEAL SHUNT PLACEMENT APPLICATION OF CRANIAL NAVIGATION LAPAROSCOPIC VENTRICULAR-PERITONEAL (V-P) SHUNT PLACEMENT     Patient location during evaluation: PACU Anesthesia Type: General Level of consciousness: sedated and patient cooperative Pain management: pain level controlled Vital Signs Assessment: post-procedure vital signs reviewed and stable Respiratory status: spontaneous breathing Cardiovascular status: stable Anesthetic complications: no   No notable events documented.  Last Vitals:  Vitals:   04/18/23 0733 04/18/23 1133  BP: (!) 155/80 (!) 136/90  Pulse: 67 (!) 54  Resp: 16 16  Temp: 37.1 C 36.9 C  SpO2: 98% 98%    Last Pain:  Vitals:   04/18/23 1133  TempSrc: Oral  PainSc:                  Lewie Loron

## 2023-04-18 NOTE — Progress Notes (Signed)
Script picked up from [facility] pharmacy and hand delivered to pt at bedside. Pt dressed and ready. Unit [facility] will transport pt down, family member states they will call an uber to take them home.   Diron Haddon,RN SWOT

## 2023-04-18 NOTE — Progress Notes (Addendum)
Pt complaining of "deep chest pain" when inhaling and it travels down her back, EKG done (per on call K. Tomilinson, MD) , came back as NSR, pain medication given multiple times with no relief  After calling her wife, Pt seems to feel some what better and referred to the feeling as spasms vs pain

## 2023-04-19 ENCOUNTER — Encounter (HOSPITAL_COMMUNITY): Payer: Self-pay | Admitting: Neurosurgery

## 2023-04-21 ENCOUNTER — Emergency Department (HOSPITAL_COMMUNITY)
Admission: EM | Admit: 2023-04-21 | Discharge: 2023-04-21 | Disposition: A | Discharge status: Home / Self Care | Payer: Medicaid Other | Attending: Emergency Medicine | Admitting: Emergency Medicine

## 2023-04-21 ENCOUNTER — Other Ambulatory Visit: Payer: Self-pay

## 2023-04-21 ENCOUNTER — Emergency Department (HOSPITAL_COMMUNITY): Payer: Medicaid Other

## 2023-04-21 ENCOUNTER — Encounter (HOSPITAL_COMMUNITY): Payer: Self-pay

## 2023-04-21 DIAGNOSIS — Z91040 Latex allergy status: Secondary | ICD-10-CM | POA: Insufficient documentation

## 2023-04-21 DIAGNOSIS — R519 Headache, unspecified: Secondary | ICD-10-CM | POA: Insufficient documentation

## 2023-04-21 DIAGNOSIS — R112 Nausea with vomiting, unspecified: Secondary | ICD-10-CM | POA: Insufficient documentation

## 2023-04-21 DIAGNOSIS — I10 Essential (primary) hypertension: Secondary | ICD-10-CM | POA: Insufficient documentation

## 2023-04-21 LAB — COMPREHENSIVE METABOLIC PANEL
ALT: 9 U/L (ref 0–44)
AST: 13 U/L — ABNORMAL LOW (ref 15–41)
Albumin: 3.3 g/dL — ABNORMAL LOW (ref 3.5–5.0)
Alkaline Phosphatase: 88 U/L (ref 38–126)
Anion gap: 8 (ref 5–15)
BUN: 11 mg/dL (ref 6–20)
CO2: 20 mmol/L — ABNORMAL LOW (ref 22–32)
Calcium: 9 mg/dL (ref 8.9–10.3)
Chloride: 110 mmol/L (ref 98–111)
Creatinine, Ser: 0.76 mg/dL (ref 0.44–1.00)
GFR, Estimated: >60 mL/min (ref >60)
Glucose, Bld: 100 mg/dL — ABNORMAL HIGH (ref 70–99)
Potassium: 3.8 mmol/L (ref 3.5–5.1)
Sodium: 138 mmol/L (ref 135–145)
Total Bilirubin: 0.4 mg/dL (ref <1.2)
Total Protein: 7.4 g/dL (ref 6.5–8.1)

## 2023-04-21 LAB — CBC WITH DIFFERENTIAL/PLATELET
Abs Immature Granulocytes: 0.02 10*3/uL (ref 0.00–0.07)
Basophils Absolute: 0 10*3/uL (ref 0.0–0.1)
Basophils Relative: 0 %
Eosinophils Absolute: 0.1 10*3/uL (ref 0.0–0.5)
Eosinophils Relative: 1 %
HCT: 38.5 % (ref 36.0–46.0)
Hemoglobin: 12.5 g/dL (ref 12.0–15.0)
Immature Granulocytes: 0 %
Lymphocytes Relative: 25 %
Lymphs Abs: 2 10*3/uL (ref 0.7–4.0)
MCH: 27.7 pg (ref 26.0–34.0)
MCHC: 32.5 g/dL (ref 30.0–36.0)
MCV: 85.4 fL (ref 80.0–100.0)
Monocytes Absolute: 0.3 10*3/uL (ref 0.1–1.0)
Monocytes Relative: 4 %
Neutro Abs: 5.7 10*3/uL (ref 1.7–7.7)
Neutrophils Relative %: 70 %
Platelets: 271 10*3/uL (ref 150–400)
RBC: 4.51 MIL/uL (ref 3.87–5.11)
RDW: 12.5 % (ref 11.5–15.5)
WBC: 8.2 10*3/uL (ref 4.0–10.5)
nRBC: 0 % (ref 0.0–0.2)

## 2023-04-21 LAB — URINALYSIS, ROUTINE W REFLEX MICROSCOPIC
Bilirubin Urine: NEGATIVE
Glucose, UA: NEGATIVE mg/dL
Hgb urine dipstick: NEGATIVE
Ketones, ur: NEGATIVE mg/dL
Nitrite: NEGATIVE
Protein, ur: 30 mg/dL — AB
Specific Gravity, Urine: 1.027 (ref 1.005–1.030)
pH: 5 (ref 5.0–8.0)

## 2023-04-21 LAB — LIPASE, BLOOD: Lipase: 22 U/L (ref 11–51)

## 2023-04-21 LAB — HCG, SERUM, QUALITATIVE: Preg, Serum: NEGATIVE

## 2023-04-21 MED ORDER — ONDANSETRON 4 MG PO TBDP
4.0000 mg | ORAL_TABLET | Freq: Once | ORAL | Status: AC
  Administered 2023-04-21: 4 mg via ORAL
  Filled 2023-04-21: qty 1

## 2023-04-21 MED ORDER — ONDANSETRON HCL 4 MG/2ML IJ SOLN
4.0000 mg | Freq: Once | INTRAMUSCULAR | Status: AC
Start: 2023-04-21 — End: 2023-04-21
  Administered 2023-04-21: 4 mg via INTRAVENOUS
  Filled 2023-04-21: qty 2

## 2023-04-21 MED ORDER — MORPHINE SULFATE (PF) 4 MG/ML IV SOLN
4.0000 mg | Freq: Once | INTRAVENOUS | Status: AC
  Administered 2023-04-21: 4 mg via INTRAVENOUS
  Filled 2023-04-21: qty 1

## 2023-04-21 NOTE — ED Provider Triage Note (Signed)
Emergency Medicine Provider Triage Evaluation Note  Tabitha Holland , a 37 y.o. female  was evaluated in triage.  Pt complains of headache. Hx of IIH diagnosed in January and had shunt placed 5 days ago.  Report having persistent nausea, vomiting, headache, and pain along surgical site since.  No fever, no new vision changes, no dysuria  Review of Systems  Positive: As above Negative: As above  Physical Exam  BP (!) 156/106   Pulse 74   Temp 98.3 F (36.8 C)   Resp 18   LMP 03/30/2023 (Exact Date)   SpO2 100%  Gen:   Awake, no distress   Resp:  Normal effort  MSK:   Moves extremities without difficulty  Other:    Medical Decision Making  Medically screening exam initiated at 2:33 PM.  Appropriate orders placed.  Tabitha Holland was informed that the remainder of the evaluation will be completed by another provider, this initial triage assessment does not replace that evaluation, and the importance of remaining in the ED until their evaluation is complete.     Tabitha Helper, PA-C 04/21/23 1438

## 2023-04-21 NOTE — Progress Notes (Signed)
  NEUROSURGERY PROGRESS NOTE   Pt presenting to ED with some HA and chest pain.  She is approximately 4 days status post placement of a right frontal ventriculoperitoneal shunt for pseudotumor.  She had an uneventful hospital course and was discharged 3 days ago.  She notes some chronic headache since surgery although she does report that the headache is different than her preoperative headaches.  She is also reporting some chest pain which she had initially postoperatively at which time EKG and vital signs were normal.  EXAM:  BP (!) 153/97   Pulse (!) 56   Temp 98.4 F (36.9 C)   Resp 15   Ht 5\' 11"  (1.803 m)   Wt (!) 167.8 kg   LMP 03/30/2023 (Exact Date)   SpO2 100%   BMI 51.60 kg/m   Awake, alert, oriented Speech fluent, appropriate CN grossly intact 5/5 BUE/BLE Wound c/d/i Shunt valve pumps easily, refills slowly  IMAGING: Noncontrast CT scan of the head was personally reviewed.  This demonstrates again slitlike ventricles, with a right frontal ventriculostomy catheter terminating at the level of the foramen of Monroe.  No hemorrhage is noted.   IMPRESSION:  37 y.o. female 4 days status post laparoscopic assisted right ventriculoperitoneal shunt placement for pseudotumor.  Patient does not have any signs of shunt malfunction or infection.  PLAN: -Can attempt some pain control here in the ED -I will plan on seeing her in the outpatient setting as scheduled for routine postoperative care   Lisbeth Renshaw, MD [facility]

## 2023-04-21 NOTE — ED Triage Notes (Signed)
Pt c/o worsening headache and N/V after having an intracranial shunt placed on Tuesday. Pt denies vision changes. No altered mental status per pt, no LOC.

## 2023-04-21 NOTE — ED Provider Notes (Signed)
[place] EMERGENCY DEPARTMENT AT [facility] Provider Note   CSN: [id number] Arrival date & time: 04/21/23  1351     History {Add pertinent medical, surgical, social history, OB history to HPI:1} Chief Complaint  Patient presents with   Headache   Nausea   Post-op Problem    TANAJIA BRAMLET is a 37 y.o. female.   Headache  This patient is at the 41-year-old female, she has a history of idiopathic intracranial hypertension and had a shunt placed approximately 4 days ago, she presents to the hospital with increasing headache, some chest discomfort and has had several episodes of nausea and vomiting over the last couple of days.  She reports that she had been placed on hydrocodone in the hospital and thought that was initially what was causing nausea and vomiting, she stopped taking it when she left the hospital and has been taking tramadol at home which was also a new medication to her several days ago.  She states that the headache is mostly right-sided behind the eye sharp and stabbing and not similar to the prior headaches that she had had.  She has no changes in vision no changes in gait no numbness or weakness no fevers or chills, no stiffness of the neck.  She also has pain in her incision sites at the right chest and the abdomen and states that "my whole thorax hurts".  She has no fever, no diarrhea, no dysuria, no swelling.  She had called the neurosurgical offices but not received a call back    Home Medications Prior to Admission medications   Medication Sig Start Date End Date Taking? Authorizing Provider  acetaZOLAMIDE (DIAMOX) 250 MG tablet Take 4 tablets (1,000 mg total) by mouth 2 (two) times daily. 01/29/23 05/29/23  [name], MD  buPROPion (WELLBUTRIN XL) 150 MG 24 hr tablet Take 1 tablet (150 mg total) by mouth daily. 02/23/23   [name], MD  celecoxib (CELEBREX) 200 MG capsule Take 1 capsule (200 mg total) by mouth 2 (two) times  daily. Patient not taking: Reported on 04/13/2023 03/16/23   [name], MD  famotidine (PEPCID) 20 MG tablet Take 20 mg by mouth daily as needed for heartburn or indigestion.    [provider]  FLUoxetine (PROZAC) 20 MG capsule TAKE 1 CAPSULE BY MOUTH EVERY DAY Patient not taking: Reported on 04/13/2023 02/26/23   [name], MD  FLUoxetine (PROZAC) 40 MG capsule Take 1 capsule (40 mg total) by mouth daily. 02/23/23   [name], MD  LORazepam (ATIVAN) 1 MG tablet Take 1 tablet (1 mg total) by mouth 2 (two) times daily as needed for anxiety. TAKE 1 TABLET(1 MG) BY MOUTH TWICE DAILY AS NEEDED FOR ANXIETY OR PANIC Strength: 1 mg Patient taking differently: Take 1 mg by mouth 2 (two) times daily as needed for anxiety (or panic). 02/23/23   [name], MD  methocarbamol (ROBAXIN) 500 MG tablet Take 500 mg by mouth every 8 (eight) hours as needed for muscle spasms.    [provider]  Olmesartan-amLODIPine-HCTZ 40-5-25 MG TABS Take 1 tablet by mouth daily. 01/26/23   [name], MD  polyethylene glycol (MIRALAX / GLYCOLAX) 17 g packet Take 17 g by mouth daily. Patient not taking: Reported on 04/13/2023 01/05/23   Rolly Salter, MD  Ruxolitinib Phosphate (OPZELURA) 1.5 % CREA Apply 1 Application topically daily as needed (eczema).    [provider]  Semaglutide-Weight Management (WEGOVY) 0.25  MG/0.5ML SOAJ Inject 0.25 mg into the skin once a week. Patient not taking: Reported on 04/13/2023 02/23/23   [name], MD  spironolactone (ALDACTONE) 50 MG tablet TAKE 1 TABLET(50 MG) BY MOUTH AT BEDTIME Patient taking differently: Take 50 mg by mouth daily. 08/30/22   [name], MD  topiramate (TOPAMAX) 200 MG tablet Take 1 tablet (200 mg total) by mouth 2 (two) times daily. 01/29/23 08/27/23  [name], MD  traMADol (ULTRAM) 50 MG tablet Take 1 tablet (50 mg total) by mouth every 6 (six) hours as needed for up to 7 days. 04/18/23 04/25/23   Lisbeth Renshaw, MD  TYLENOL 500 MG tablet Take 500-1,000 mg by mouth every 6 (six) hours as needed for mild pain (pain score 1-3) or headache.    [provider]      Allergies    Butalbital, Codeine, Fish allergy, Hydrocodone, Latex, Percocet [oxycodone-acetaminophen], and Shellfish allergy    Review of Systems   Review of Systems  Neurological:  Positive for headaches.  All other systems reviewed and are negative.   Physical Exam Updated Vital Signs BP (!) 156/106   Pulse 74   Temp 98.3 F (36.8 C)   Resp 18   LMP 03/30/2023 (Exact Date)   SpO2 100%  Physical Exam Vitals and nursing note reviewed.  Constitutional:      General: She is not in acute distress.    Appearance: She is well-developed.  HENT:     Head: Normocephalic and atraumatic.     Mouth/Throat:     Pharynx: No oropharyngeal exudate.  Eyes:     General: No scleral icterus.       Right eye: No discharge.        Left eye: No discharge.     Conjunctiva/sclera: Conjunctivae normal.     Pupils: Pupils are equal, round, and reactive to light.  Neck:     Thyroid: No thyromegaly.     Vascular: No JVD.  Cardiovascular:     Rate and Rhythm: Normal rate and regular rhythm.     Heart sounds: Normal heart sounds. No murmur heard.    No friction rub. No gallop.  Pulmonary:     Effort: Pulmonary effort is normal. No respiratory distress.     Breath sounds: Normal breath sounds. No wheezing or rales.  Abdominal:     General: Bowel sounds are normal. There is no distension.     Palpations: Abdomen is soft. There is no mass.     Tenderness: There is no abdominal tenderness.     Comments: Abdomen is completely soft and nontender  Musculoskeletal:        General: No tenderness. Normal range of motion.     Cervical back: Normal range of motion and neck supple.  Lymphadenopathy:     Cervical: No cervical adenopathy.  Skin:    General: Skin is warm and dry.     Findings: No erythema or rash.      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  Neurological:     Mental Status: She is alert.     Coordination: Coordination normal.     Comments: Speech is clear, cranial nerves III through XII are intact, memory is intact, strength is normal in all 4 extremities including grips and strength at the bilateral thighs, knees and ankles to extention and flexion,  sensation is intact to light touch and pinprick in all 4 extremities. Coordination as tested by finger-nose-finger is normal, no limb ataxia. Normal gait, normal reflexes at the patellar tendons bilaterally  Psychiatric:        Behavior: Behavior normal.     ED Results / Procedures / Treatments   Labs (all labs ordered are listed, but only abnormal results are displayed) Labs Reviewed  COMPREHENSIVE METABOLIC PANEL - Abnormal; Notable for the following components:      Result Value   CO2 20 (*)    Glucose, Bld 100 (*)    Albumin 3.3 (*)    AST 13 (*)    All other components within normal limits  URINALYSIS, ROUTINE W REFLEX MICROSCOPIC - Abnormal; Notable for the following components:   Color, Urine AMBER (*)    APPearance HAZY (*)    Protein, ur 30 (*)    Leukocytes,Ua TRACE (*)    Bacteria, UA RARE (*)    All other components within normal limits  CBC WITH DIFFERENTIAL/PLATELET  LIPASE, BLOOD  HCG, SERUM, QUALITATIVE    EKG None  Radiology CT Head Wo Contrast Result Date: 04/21/2023 CLINICAL DATA:  Headache.  Intracranial hypertension features. EXAM: CT HEAD WITHOUT CONTRAST TECHNIQUE: Contiguous axial images were obtained from the base of the skull through the vertex without intravenous contrast. RADIATION DOSE REDUCTION: This exam was performed according to the departmental dose-optimization program which includes automated exposure control, adjustment of the mA and/or kV according to  patient size and/or use of iterative reconstruction technique. COMPARISON:  04/13/2023, 01/08/2023 FINDINGS: Brain: Interval placement of left frontal ventriculostomy catheter with tip adjacent the midline just below the frontal horns of the lateral ventricles. Ventricles, cisterns and other CSF spaces are otherwise unremarkable. No mass, mass effect, shift of midline structures or acute hemorrhage. No acute infarction. Minimal intracranial air due to patient's recent procedure. Vascular: No hyperdense vessel or unexpected calcification. Skull: Surgical defect over the right frontal skull due to ventriculostomy placement. Sinuses/Orbits: No acute finding. Other: Postsurgical changes over the right frontal scalp. IMPRESSION: Interval placement of left frontal ventriculostomy catheter with tip adjacent the midline just below the frontal horns of the lateral ventricles. No acute findings. Electronically Signed   By: Elberta Fortis M.D.   On: 04/21/2023 15:35    Procedures Procedures  {Document cardiac monitor, telemetry assessment procedure when appropriate:1}  Medications Ordered in ED Medications  ondansetron (ZOFRAN-ODT) disintegrating tablet 4 mg (4 mg Oral Given 04/21/23 1446)    ED Course/ Medical Decision Making/ A&P   {   Click here for ABCD2, HEART and other calculatorsREFRESH Note before signing :1}                              Medical Decision Making Amount and/or Complexity of Data Reviewed Radiology: ordered.    This patient presents to the ED for concern of thoracic pain as well as headache differential diagnosis includes postsurgical pain, consider shunt malfunction, unlikely to be infection given the benign exam normal vital signs and lack of leukocytosis    Additional history obtained:  Additional history obtained from medical record External records from outside source obtained and reviewed including surgical notes   Lab Tests:  I Ordered, and personally interpreted  labs.  The pertinent results include: CBC and metabolic panel unremarkable   Imaging Studies ordered:  I ordered imaging studies including CT scan of the brain without contrast I independently visualized  and interpreted imaging which showed no acute findings, shunt in place I agree with the radiologist interpretation   Medicines ordered and prescription drug management:  I ordered medication including ***  for ***  Reevaluation of the patient after these medicines showed that the patient {resolved/improved/worsened:23923::"improved"} I have reviewed the patients home medicines and have made adjustments as needed   Problem List / ED Course:  ***   Social Determinants of Health:       {Document critical care time when appropriate:1} {Document review of labs and clinical decision tools ie heart score, Chads2Vasc2 etc:1}  {Document your independent review of radiology images, and any outside records:1} {Document your discussion with family members, caretakers, and with consultants:1} {Document social determinants of health affecting pt's care:1} {Document your decision making why or why not admission, treatments were needed:1} Final Clinical Impression(s) / ED Diagnoses Final diagnoses:  None    Rx / DC Orders ED Discharge Orders     None

## 2023-04-21 NOTE — Discharge Instructions (Signed)
You have been seen here in the emergency department for headache. We have obtained a full history, performed a physical exam, in addition to other diagnostic tests and treatments. Right now, we feel that you are safe for discharge from a medical perspective, and do not have an acute life threatening illness.   We have talked in the neurosurgery staff, they do not feel is any emergent thing happening at this point.  Please take all Tylenol and ibuprofen as needed for pain  To do: 1.) Take all medications as prescribed.   2.) If anything changes, or you develop fevers, chills, inability to eat or drink, severe pain, new symptoms, return of symptoms, worsening of symptoms, or any other concerns, please call 911 or come back to the emergency department as soon as possible.   3.) Please make an appointment with your primary care doctor for a follow-up visit after being seen here in the emergency department. If you do not have a primary care doctor, you can call [phone] for assistance in finding one or your health care insurance company.    Thank you for allowing me to take care of you today. We hope that you feel better soon.

## 2023-04-24 ENCOUNTER — Encounter: Payer: Self-pay | Admitting: Student

## 2023-04-24 DIAGNOSIS — G932 Benign intracranial hypertension: Secondary | ICD-10-CM

## 2023-04-26 MED ORDER — KETOROLAC TROMETHAMINE 10 MG PO TABS: 10.0000 mg | ORAL_TABLET | Freq: Three times a day (TID) | ORAL | 0 refills | Status: AC | PRN

## 2023-05-17 ENCOUNTER — Ambulatory Visit: Payer: Medicaid Other | Admitting: Neurology

## 2023-05-24 ENCOUNTER — Encounter: Payer: Self-pay | Admitting: Student

## 2023-05-24 ENCOUNTER — Ambulatory Visit (INDEPENDENT_AMBULATORY_CARE_PROVIDER_SITE_OTHER): Payer: Medicaid Other | Admitting: Student

## 2023-05-24 DIAGNOSIS — I10 Essential (primary) hypertension: Secondary | ICD-10-CM | POA: Diagnosis not present

## 2023-05-24 DIAGNOSIS — R102 Pelvic and perineal pain: Secondary | ICD-10-CM

## 2023-05-24 DIAGNOSIS — M5481 Occipital neuralgia: Secondary | ICD-10-CM

## 2023-05-24 MED ORDER — CLONIDINE 0.1 MG/24HR TD PTWK: 0.1000 mg | MEDICATED_PATCH | TRANSDERMAL | 12 refills | Status: DC

## 2023-05-24 MED ORDER — WEGOVY 0.5 MG/0.5ML ~~LOC~~ SOAJ: 0.5000 mg | SUBCUTANEOUS | 1 refills | Status: DC

## 2023-05-24 NOTE — Progress Notes (Signed)
SUBJECTIVE:   CHIEF COMPLAINT / HPI:   Pelvic Pain Present for about a month now.  She thought initially that it was related to insufflation during surgery when she had a VP shunt placed.  However, as pain has persisted, she now feels that this is likely related to her history of fibroids.  She tells me that it feels "exactly" like her fibroid pain.  She was seen by Dr. Shawnie Holland and had resection of a submucosal fibroid in 2022.  Her periods are unchanged over the same time period.  They remain irregular but are not super heavy.  She remains sexually active with just 1 female partner and feels confident that she has not had any exposure to any STIs.  Hypertension Longstanding hypertension that has been quite difficult to control.  She is currently on olmesartan-amlodipine-HCTZ 40-5-25 mg daily.  She is also taking 50 mg of spironolactone daily.  Endorses good adherence to these meds.  Weight Loss Efforts  BMI >50 As she is feeling better after having her VP shunt placed, she is motivated to get back to more aggressive weight loss efforts.  She has been on just 0.25 mg of Wegovy for quite a while now. Feels up to dose increase today.  Head pain She felt that she had recovered well from her VP shunt placement for the first few weeks after surgery.  However about a week ago she started to have tenderness and some sharp shooting pain on the right side of her head and down towards her neck.  She feels like this is not the scalp and is not a true headache.  Denies any associated vision or neurologic changes.  PERTINENT  PMH / PSH: IIH s/p VP shunt placement on 04/17/23  OBJECTIVE:   BP (!) 160/100   Wt (!) 379 lb 3.2 oz (172 kg)   BMI 52.89 kg/m   General: alert & oriented, no apparent distress, well groomed Head: There are no overlying skin changes to the affected area on the R side of the scalp. The skin is tender to palpation roughly along the area innervated by the lesser occipital nerve.   EENT: atraumatic, EOM intact Respiratory: normal respiratory effort GI: non-distended, obese Skin: no rashes, no jaundice Psych: appropriate mood and affect   ASSESSMENT/PLAN:   Pelvic pain Given the similarity between her present symptoms and her past fibroids, I certainly have a suspicion that her pain may be related to her fibroids.  Deferred a GU exam today.  She is already planning to follow-up with Dr. Shawnie Holland for this and I think that is a quite reasonable next step.  Will defer any further imaging or workup to Dr. Shawnie Holland.  As of now this does not seem to be affecting her bleeding pattern. - F/u with Dr. Shawnie Holland  Primary hypertension Difficult to control. Inadequate control despite maximal ARB therapy. Would not want to increase amlodipine due to sensitivtiy to LE edema. Considered increase in her spironolactone, but for now will add clonidine patch 0.1mg /24hr. - F/u in a few weeks for BP check  - If remains with inadequate control, would consider referral to [facility] HTN clinic   Morbid obesity (HCC) Weight is stable. Hasn't seen much movement on Wegovy but has only been on minimal dose (0.25mg /week). - Increase Wegovy to 0.5mg /week, likely will need further dose increase moving forward - F/u in 3-4 weeks   Occipital neuralgia of right side History and exam c/w occipital neuralgia vs local irritation related to her  drain. Nothing at this point to suggest infection. In a patient without a drain, would consider trial of an occipital nerve block, but given she is post-op, will defer management to neurosurgery. - She will call Dr. Val Riles office for follow-up      J Dorothyann Gibbs, MD [facility]

## 2023-05-24 NOTE — Patient Instructions (Addendum)
Let's start you on a patch for your BP. This will be once weekly.  If you start feeling light-headed, take the patch off.   I am increasing your Wegovy to 0.5mg /week. Make an appt in 1 month and we can go up at that time.   For your pelvic pain, I think following up with Dr. Shawnie Pons makes the most sense, I doubt [place] doing any labs or imaging here would be high yield.   For the head pain, Dr. Conchita Paris is probably your best bet. This sounds like something called "occipital neuralgia" which can be treated with a local injection/nerve block.    Eliezer Mccoy, MD

## 2023-05-25 DIAGNOSIS — R102 Pelvic and perineal pain: Secondary | ICD-10-CM | POA: Insufficient documentation

## 2023-05-25 DIAGNOSIS — M5481 Occipital neuralgia: Secondary | ICD-10-CM | POA: Insufficient documentation

## 2023-05-25 NOTE — Assessment & Plan Note (Signed)
Difficult to control. Inadequate control despite maximal ARB therapy. Would not want to increase amlodipine due to sensitivtiy to LE edema. Considered increase in her spironolactone, but for now will add clonidine patch 0.1mg /24hr. - F/u in a few weeks for BP check  - If remains with inadequate control, would consider referral to [facility] HTN clinic

## 2023-05-25 NOTE — Assessment & Plan Note (Signed)
History and exam c/w occipital neuralgia vs local irritation related to her drain. Nothing at this point to suggest infection. In a patient without a drain, would consider trial of an occipital nerve block, but given she is post-op, will defer management to neurosurgery. - She will call Dr. Val Riles office for follow-up

## 2023-05-25 NOTE — Assessment & Plan Note (Signed)
Given the similarity between her present symptoms and her past fibroids, I certainly have a suspicion that her pain may be related to her fibroids.  Deferred a GU exam today.  She is already planning to follow-up with Dr. Shawnie Pons for this and I think that is a quite reasonable next step.  Will defer any further imaging or workup to Dr. Shawnie Pons.  As of now this does not seem to be affecting her bleeding pattern. - F/u with Dr. Shawnie Pons

## 2023-05-25 NOTE — Assessment & Plan Note (Signed)
Weight is stable. Hasn't seen much movement on Wegovy but has only been on minimal dose (0.25mg /week). - Increase Wegovy to 0.5mg /week, likely will need further dose increase moving forward - F/u in 3-4 weeks

## 2023-06-25 ENCOUNTER — Ambulatory Visit (INDEPENDENT_AMBULATORY_CARE_PROVIDER_SITE_OTHER): Payer: Medicaid Other | Admitting: Student

## 2023-06-25 ENCOUNTER — Encounter: Payer: Self-pay | Admitting: Student

## 2023-06-25 DIAGNOSIS — I10 Essential (primary) hypertension: Secondary | ICD-10-CM

## 2023-06-25 MED ORDER — WEGOVY 1 MG/0.5ML ~~LOC~~ SOAJ: 1.0000 mg | SUBCUTANEOUS | 1 refills | Status: DC

## 2023-06-25 NOTE — Patient Instructions (Signed)
 Tasha,  Let's go up on the [facility] to 1mg /week. You may have some nausea at first, this is to be expected. You're moving in all the right directions. Down over 9 lbs. More active, feeling better. I think this is a virtuous cycle that should continue.  Tell Trish to come see me.  Eliezer Mccoy, MD

## 2023-06-25 NOTE — Progress Notes (Unsigned)
    SUBJECTIVE:   CHIEF COMPLAINT / HPI:   Weight Loss Efforts Has been on Wegovy at 0.5mg  weekly. Noticing a difference and feeling better! She is down over 9 lbs since her last dose increase and is feeling motivated to get more active. Is very pleased with this progress. Had some minor nausea at first with the last dose increase but this was short-lived.    PERTINENT  PMH / PSH: IIH s/p VP shunt; HTN  OBJECTIVE:   BP 130/86 Comment: thigh cuff on upper arm  Pulse 67   Ht 5\' 11"  (1.803 m)   Wt (!) 370 lb (167.8 kg)   LMP 06/13/2023   SpO2 100%   BMI 51.60 kg/m   General: alert & oriented, no apparent distress, well groomed HEENT: normocephalic, atraumatic, EOM grossly intact, oral mucosa moist, neck supple Respiratory: normal respiratory effort GI: non-distended Skin: no rashes, no jaundice Psych: appropriate mood and affect   ASSESSMENT/PLAN:   Assessment & Plan Morbid obesity (HCC) Obesity complicated by comorbid IIH and HTN. Tolerating Wegovy 0.5mg /week nicely. Certainly has room to go up from here. - INCREASE Wegovy to 1mg /week - F/u in 4-6 weeks     J Dorothyann Gibbs, MD [facility]

## 2023-06-27 NOTE — Assessment & Plan Note (Signed)
 Obesity complicated by comorbid IIH and HTN. Tolerating Wegovy 0.5mg /week nicely. Certainly has room to go up from here. - INCREASE Wegovy to 1mg /week - F/u in 4-6 weeks

## 2023-07-03 ENCOUNTER — Encounter: Payer: Self-pay | Admitting: Neurology

## 2023-07-03 ENCOUNTER — Ambulatory Visit (INDEPENDENT_AMBULATORY_CARE_PROVIDER_SITE_OTHER): Payer: Medicaid Other | Admitting: Neurology

## 2023-07-03 VITALS — BP 131/88 | HR 77 | Ht 68.0 in | Wt 380.0 lb

## 2023-07-03 DIAGNOSIS — Z982 Presence of cerebrospinal fluid drainage device: Secondary | ICD-10-CM | POA: Diagnosis not present

## 2023-07-03 DIAGNOSIS — G932 Benign intracranial hypertension: Secondary | ICD-10-CM | POA: Diagnosis not present

## 2023-07-03 NOTE — Patient Instructions (Signed)
 Continue with current medications including Diamox 1000 mg twice daily and Topiramate 200 mg twice daily  Continue with weight management  Return in 6 months or sooner if worse

## 2023-07-03 NOTE — Progress Notes (Signed)
 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 Pt states she had a bad migraine last week, and feels her vision is getting worse.    INTERVAL HISTORY 07/03/2023:  Patient presents today for follow-up, last visit was in September, since then she did follow-up with Dr. Conchita Paris and did have her VP shunt completed.  Since the placement of the shunt, she tells me that her headaches are well-controlled, she has been doing well until last week when she had a bad headache but relates it to increase stress.  Overall, she is doing well, she complains of her vision getting worse but is scheduled to see ophthalmologist soon.  She also started [facility] for weight loss management.   INTERVAL HISTORY 01/29/2023:  Patient presents today for follow-up, last visit was in January at that time we suspected IIH, sent her for LP with opening pressure of 27.  She was started on Diamox then Topamax added.  Patient reports during the past 8 months, she has been in and out of the hospital due to worsening headaches.  She did have a total of 3 LPs.  After each LP, she will have relieved for about 48 hours. Her last hospitalization was on September 9.  After that last visit, she was also recommended to follow-up with neurosurgery for evaluation of possible shunt, she does have an appointment scheduled this Thursday.  She reports currently she is in pain, her vision is getting worse left worse than right.  Pain is worse with movement.  She mainly sit down or lay down in a dark room.  She is still struggling with her weights feels like it might be related to her thyroid disease.    HISTORY OF PRESENT ILLNESS:  This is a 38 year old woman  past medical history of anxiety, depression, hypertension, obesity, headaches who is presenting with complaint of persistent headaches for the past 9 days.  Patient reports she woke up last Wednesday morning with headaches and the headache did not go away.  On top of the headache she also experienced back pain, visual obscuration and buzzing feeling in the ears.  She also reports pressure in the head.  The pain was so severe that she presented to the ED 3 days ago.  At that time, she did have a head CT which showed no acute abnormality but show evidence of partially empty sella.  She was also given a migraine cocktail which improved her symptoms.  She is still symptomatic, describes a headache at 7 out of 10, pressure squeezing pain behind the left eye which is also throbbing. She has tried Tylenol.  Patient reports she woke up today with loss of vision in the left eye. She did report a history of headaches since the age of 63 intermittent but this is the most severe episode she ever had.  She also feels with this episode her speech is not coherent she had difficulty formulating the words.   Headache History and Characteristics: Onset: Last Tuesday 05/09/22 Location: Behind left eye  Quality:  Pressure squeezing type pain, thrombing Intensity: 7 /10 currently   Duration: Last  Migrainous Features: Photophobia, nausea, vomiting.  Aura: No  History of  brain injury or tumor: No  Family history: Not sure  Motion sickness: no Cardiac history: no  OTC: tylenol, Toradol  Caffeine: Morning coffee  Sleep:  Good  Mood/ Stress:   Prior prophylaxis: Propranolol: No  Verapamil:No TCA: No Topamax: No Depakote: No Effexor: No Cymbalta: No Neurontin:No  Prior abortives: Triptan: Yes  Anti-emetic: No Steroids: No Ergotamine suppository: No   OTHER MEDICAL CONDITIONS: Obesity, Headaches, Hypertension, anxiety/Depression   REVIEW OF SYSTEMS: Full 14 system review of systems performed and  negative with exception of: As noted in the HPI   ALLERGIES: Allergies  Allergen Reactions   Butalbital Itching   Codeine Other (See Comments)    Migraines    Fish Allergy Itching   Hydrocodone Other (See Comments)    Migraines    Latex Itching   Percocet [Oxycodone-Acetaminophen] Other (See Comments)    Migraines    Shellfish Allergy Itching    HOME MEDICATIONS: Outpatient Medications Prior to Visit  Medication Sig Dispense Refill   buPROPion (WELLBUTRIN XL) 150 MG 24 hr tablet Take 1 tablet (150 mg total) by mouth daily. 30 tablet 1   cloNIDine (CATAPRES - DOSED IN MG/24 HR) 0.1 mg/24hr patch Place 1 patch (0.1 mg total) onto the skin once a week. 4 patch 12   FLUoxetine (PROZAC) 40 MG capsule Take 1 capsule (40 mg total) by mouth daily. 30 capsule 0   ketorolac (TORADOL) 10 MG tablet Take 1 tablet (10 mg total) by mouth every 8 (eight) hours as needed for severe pain (pain score 7-10). 6 tablet 0   LORazepam (ATIVAN) 1 MG tablet Take 1 tablet (1 mg total) by mouth 2 (two) times daily as needed for anxiety. TAKE 1 TABLET(1 MG) BY MOUTH TWICE DAILY AS NEEDED FOR ANXIETY OR PANIC Strength: 1 mg (Patient taking differently: Take 1 mg by mouth 2 (two) times daily as needed for anxiety (or panic).) 6 tablet 0   methocarbamol (ROBAXIN) 500 MG tablet Take 500 mg by mouth every 8 (eight) hours as needed for muscle spasms.     Olmesartan-amLODIPine-HCTZ 40-5-25 MG TABS Take 1 tablet by mouth daily.     Ruxolitinib Phosphate (OPZELURA) 1.5 % CREA Apply 1 Application topically daily as needed (eczema).     Semaglutide-Weight Management (WEGOVY) 1 MG/0.5ML SOAJ Inject 1 mg into the skin once a week. 2 mL 1   spironolactone (ALDACTONE) 50 MG tablet TAKE 1 TABLET(50 MG) BY MOUTH AT BEDTIME (Patient taking differently: Take 50 mg by mouth daily.) 90 tablet 1   topiramate (TOPAMAX) 200 MG tablet Take 1 tablet (200 mg total) by mouth 2 (two) times daily. 60 tablet 6   TYLENOL 500 MG tablet Take  500-1,000 mg by mouth every 6 (six) hours as needed for mild pain (pain score 1-3) or headache.     acetaZOLAMIDE (DIAMOX) 250 MG tablet Take 4 tablets (1,000 mg total) by mouth 2 (two) times daily. 960 tablet 0   famotidine (PEPCID) 20 MG tablet Take 20 mg by mouth daily as needed for heartburn or indigestion. (Patient not taking: Reported on 07/03/2023)     Facility-Administered Medications Prior to Visit  Medication Dose Route Frequency Provider Last Rate Last Admin   diclofenac Sodium (VOLTAREN) 1 % topical gel 2 g  2 g Topical PRN Bess Kinds, MD        PAST MEDICAL HISTORY: Past Medical History:  Diagnosis Date   Anemia    Hx. Not currently (2024)   Anxiety    Complication of  anesthesia    High anxiety r/t surgery   Depression    Gallstones    History of anemia    per pt on 02/02/2021   History of suicidal ideation 10/02/2019   Hx of wisdom tooth extraction    Hypertension    IIH (idiopathic intracranial hypertension)    Migraines    Related Intracraniel fluid   Morbid obesity (HCC)     PAST SURGICAL HISTORY: Past Surgical History:  Procedure Laterality Date   APPLICATION OF CRANIAL NAVIGATION  04/17/2023   Procedure: APPLICATION OF CRANIAL NAVIGATION;  Surgeon: Lisbeth Renshaw, MD;  Location: [facility] OR;  Service: Neurosurgery;;   CESAREAN SECTION N/A 10/28/2013   Procedure: CESAREAN SECTION;  Surgeon: Allie Bossier, MD;  Location: [facility] ORS;  Service: Obstetrics;  Laterality: N/A;   CESAREAN SECTION N/A 09/15/2015   Procedure: CESAREAN SECTION;  Surgeon: Catalina Antigua, MD;  Location: [facility];  Service: Obstetrics;  Laterality: N/A;   CESAREAN SECTION N/A 05/26/2018   Procedure: CESAREAN SECTION;  Surgeon: Adam Phenix, MD;  Location: [facility] BIRTH[facility] SU[facility];  Service: Obstetrics;  Laterality: N/A;   CHOLECYSTECTOMY     Early 2000s   DILATATION & CURETTAGE/HYSTEROSCOPY WITH MYOSURE N/A 02/09/2021   Procedure: DILATATION & CURETTAGE/HYSTEROSCOPY WITH MYOSURE;   Surgeon: Reva Bores, MD;  Location: [facility];  Service: Gynecology;  Laterality: N/A;  fibroid resection   LAPAROSCOPIC REVISION VENTRICULAR-PERITONEAL (V-P) SHUNT  04/17/2023   Procedure: LAPAROSCOPIC VENTRICULAR-PERITONEAL (V-P) SHUNT PLACEMENT;  Surgeon: Axel Filler, MD;  Location[facility] OR;  Service: General;;   VENTRICULOPERITONEAL SHUNT N/A 04/17/2023   Procedure: LAPAROSCOPIC ASSISTED VENTRICULOPERITONEAL SHUNT PLACEMENT;  Surgeon: Lisbeth Renshaw, MD;  Location: [facility] OR;  Service: Neurosurgery;  Laterality: N/A;  66M   WISDOM TOOTH EXTRACTION      FAMILY HISTORY: Family History  Adopted: Yes  Problem Relation Age of Onset   Hypertension Sister    Bipolar disorder Brother    Schizophrenia Brother     SOCIAL HISTORY: Social History   Socioeconomic History   Marital status: Media planner    Spouse name: Not on file   Number of children: 3   Years of education: Not on file   Highest education level: Not on file  Occupational History   Occupation: Artist  Tobacco Use   Smoking status: Every Day    Current packs/day: 0.25    Types: Cigarettes   Smokeless tobacco: Never   Tobacco comments:    3 cigarettes per day as of 04/11/2023  Vaping Use   Vaping status: Never Used  Substance and Sexual Activity   Alcohol use: Not Currently    Comment: Rarely   Drug use: Not Currently    Types: Marijuana   Sexual activity: Not Currently    Birth control/protection: None  Other Topics Concern   Not on file  Social History Narrative   Not on file   Social Drivers of Health   Financial Resource Strain: Not on file  Food Insecurity: No Food Insecurity (04/17/2023)   Hunger Vital Sign    Worried About Running Out of Food in the Last Year: Never true    Ran Out of Food in the Last Year: Never true  Transportation Needs: No Transportation Needs (04/17/2023)   PRAPARE - Administrator, Civil Service (Medical): No    Lack of  Transportation (Non-Medical): No  Physical Activity: Not on file  Stress: Not on file  Social Connections: Not on file  Intimate Partner Violence: Not At Risk (04/17/2023)   Humiliation, Afraid, Rape, and Kick questionnaire    Fear of Current or Ex-Partner: No    Emotionally Abused: No    Physically Abused: No    Sexually Abused: No    PHYSICAL EXAM  GENERAL EXAM/CONSTITUTIONAL: Vitals:  Vitals:   07/03/23 1112  BP: 131/88  Pulse: 77  Weight: (!) 380 lb (172.4 kg)  Height: 5\' 8"  (1.727 m)   Body mass index is 57.78 kg/m. Wt Readings from Last 3 Encounters:  07/03/23 (!) 380 lb (172.4 kg)  06/25/23 (!) 370 lb (167.8 kg)  05/24/23 (!) 379 lb 3.2 oz (172 kg)   Patient is in no distress; well developed, nourished and groomed; neck is supple, sitting in a dark room  MUSCULOSKELETAL: Gait, strength, tone, movements noted in Neurologic exam below  NEUROLOGIC: MENTAL STATUS:      No data to display         awake, alert, oriented to person, place and time recent and remote memory intact normal attention and concentration language fluent, comprehension intact, naming intact fund of knowledge appropriate  CRANIAL NERVE:  2nd - bilateral papilledema present  2nd, 3rd, 4th, 6th - pupils equal and reactive to light, visual fields full to confrontation, extraocular muscles intact, no nystagmus 5th - facial sensation symmetric 7th - facial strength symmetric 8th - hearing intact 9th - palate elevates symmetrically, uvula midline 11th - shoulder shrug symmetric 12th - tongue protrusion midline  MOTOR:  normal bulk and tone, full strength in the BUE, BLE  SENSORY:  normal and symmetric to light touch  COORDINATION:  finger-nose-finger, fine finger movements normal  GAIT/STATION:  normal   DIAGNOSTIC DATA (LABS, IMAGING, TESTING) - I reviewed patient records, labs, notes, testing and imaging myself where available.  Lab Results  Component Value Date   WBC 8.2  04/21/2023   HGB 12.5 04/21/2023   HCT 38.5 04/21/2023   MCV 85.4 04/21/2023   PLT 271 04/21/2023      Component Value Date/Time   NA 138 04/21/2023 1442   NA 140 03/31/2022 1512   K 3.8 04/21/2023 1442   CL 110 04/21/2023 1442   CO2 20 (L) 04/21/2023 1442   GLUCOSE 100 (H) 04/21/2023 1442   BUN 11 04/21/2023 1442   BUN 10 03/31/2022 1512   CREATININE 0.76 04/21/2023 1442   CREATININE 0.54 07/18/2013 0924   CALCIUM 9.0 04/21/2023 1442   PROT 7.4 04/21/2023 1442   PROT 6.6 08/03/2020 0952   ALBUMIN 3.3 (L) 04/21/2023 1442   ALBUMIN 3.7 (L) 08/03/2020 0952   AST 13 (L) 04/21/2023 1442   ALT 9 04/21/2023 1442   ALKPHOS 88 04/21/2023 1442   BILITOT 0.4 04/21/2023 1442   BILITOT 0.6 08/03/2020 0952   GFRNONAA >60 04/21/2023 1442   GFRAA >60 10/02/2019 1645   Lab Results  Component Value Date   CHOL 139 08/03/2020   HDL 55 08/03/2020   LDLCALC 72 08/03/2020   TRIG 58 08/03/2020   CHOLHDL 2.5 08/03/2020   Lab Results  Component Value Date   HGBA1C 5.6 02/23/2023   No results found for: "VITAMINB12" Lab Results  Component Value Date   TSH 1.730 02/23/2023    Head CT 05/15/2022 1. No evidence of acute intracranial abnormality. 2. Partially empty sella, often an incidental finding though can be seen within idiopathic intracranial hypertension.   MRI Brain/MRV Head 12/27/2022 1. No evidence of acute intracranial abnormality. 2. Partially empty sella, narrowed left transverse sinus  and mildly prominent Meckel's caves, which is nonspecific but can be seen with idiopathic intracranial hypertension. 3. No evidence of dural venous sinus thrombosis.   ASSESSMENT AND PLAN  38 y.o. year old female with history of obesity, hypertension, IIH,  anxiety/depression who is presenting for follow-up for IIH.  She is currently doing very well after the placement of the VP shunt, still on Diamox and topiramate.  Plan will be for patient to continue Diamox and topiramate and I told her  to continue her weight management.  If she is able to lose a large amount of weight, we will consider reducing some of her medications.  For now continue to follow with your doctors and I will see her in 6 months for follow-up or sooner if worse.   1. IIH (idiopathic intracranial hypertension)   2. S/P VP shunt   3. Morbid obesity (HCC)     Patient Instructions  Continue with current medications including Diamox 1000 mg twice daily and Topiramate 200 mg twice daily  Continue with weight management  Return in 6 months or sooner if worse    No orders of the defined types were placed in this encounter.   No orders of the defined types were placed in this encounter.   Return in about 6 months (around 01/03/2024).    [name], MD 07/03/2023, 12:03 PM  [facility] [address] [place], [place] [address] [phone]

## 2023-07-20 ENCOUNTER — Encounter: Payer: Self-pay | Admitting: Student

## 2023-07-25 ENCOUNTER — Encounter: Payer: Self-pay | Admitting: Family Medicine

## 2023-07-25 ENCOUNTER — Ambulatory Visit: Admitting: Family Medicine

## 2023-07-25 MED ORDER — SEMAGLUTIDE-WEIGHT MANAGEMENT 1.7 MG/0.75ML ~~LOC~~ SOAJ: 1.7000 mg | SUBCUTANEOUS | 3 refills | Status: DC

## 2023-07-25 NOTE — Progress Notes (Addendum)
    SUBJECTIVE:   CHIEF COMPLAINT / HPI:   LT is a 38 year old F with history of obesity that presents for weight management. - Does not feel like her current dose of Reginal Lutes is doing enough for her right now.  She she does not feel like she is getting enough weight loss benefit from it yet. -She is tolerating her current dose well.  Denies any GI side effects such as diarrhea or nausea. -Patient desire to increase her dose.   OBJECTIVE:   BP (!) 192/132   Pulse 91   Ht 5\' 11"  (1.803 m)   Wt (!) 373 lb (169.2 kg)   LMP 07/23/2023   SpO2 100%   BMI 52.02 kg/m   General: Alert woman. NAD. HEENT: NCAT. MMM. CV: RRR, no murmurs.  Resp: CTAB, no wheezing or crackles. Normal WOB on RA.  Abm: Soft, nontender, nondistended. BS present. Ext: Moves all ext spontaneously Skin: Warm, well perfused   ASSESSMENT/PLAN:   Assessment & Plan Morbid obesity (HCC) No significant net weight change since starting and uptitrating Wegovy in January.  Tolerating current dose well.  We will increase dose -Increase to Wegovy 1.7 mg weekly  Addendum: Called pt 08/02/23. Pt is not on contraception but also not sexually active with female partners.     Lincoln Brigham, MD [facility]

## 2023-07-25 NOTE — Patient Instructions (Signed)
 Good to see you today - Thank you for coming in  Things we discussed today:  1) I will increase your Wegovy (semaglutide) to 1.7mg  once a week. -You may experience some GI side effects such as diarrhea or nausea.  This usually improves as your body gets used to the higher dose. -Come back in about 1 to 2 months to follow-up on your weight

## 2023-07-26 NOTE — Assessment & Plan Note (Addendum)
 No significant net weight change since starting and uptitrating Wegovy in January.  Tolerating current dose well.  We will increase dose -Increase to Wegovy 1.7 mg weekly  Addendum: Called pt 08/02/23. Pt is not on contraception but also not sexually active with female partners.

## 2023-09-06 ENCOUNTER — Emergency Department (HOSPITAL_COMMUNITY)
Admission: EM | Admit: 2023-09-06 | Discharge: 2023-09-06 | Disposition: A | Discharge status: Home / Self Care | Attending: Emergency Medicine | Admitting: Emergency Medicine

## 2023-09-06 ENCOUNTER — Emergency Department (HOSPITAL_COMMUNITY)

## 2023-09-06 ENCOUNTER — Encounter (HOSPITAL_COMMUNITY): Payer: Self-pay | Admitting: Emergency Medicine

## 2023-09-06 ENCOUNTER — Other Ambulatory Visit: Payer: Self-pay

## 2023-09-06 DIAGNOSIS — R079 Chest pain, unspecified: Secondary | ICD-10-CM

## 2023-09-06 DIAGNOSIS — R519 Headache, unspecified: Secondary | ICD-10-CM | POA: Diagnosis present

## 2023-09-06 DIAGNOSIS — G43811 Other migraine, intractable, with status migrainosus: Secondary | ICD-10-CM | POA: Diagnosis not present

## 2023-09-06 DIAGNOSIS — Z79899 Other long term (current) drug therapy: Secondary | ICD-10-CM | POA: Diagnosis not present

## 2023-09-06 DIAGNOSIS — R0789 Other chest pain: Secondary | ICD-10-CM | POA: Diagnosis not present

## 2023-09-06 DIAGNOSIS — Z91040 Latex allergy status: Secondary | ICD-10-CM | POA: Insufficient documentation

## 2023-09-06 DIAGNOSIS — R0602 Shortness of breath: Secondary | ICD-10-CM | POA: Diagnosis not present

## 2023-09-06 DIAGNOSIS — I10 Essential (primary) hypertension: Secondary | ICD-10-CM | POA: Diagnosis not present

## 2023-09-06 LAB — TROPONIN I (HIGH SENSITIVITY): Troponin I (High Sensitivity): 5 ng/L (ref <18)

## 2023-09-06 LAB — COMPREHENSIVE METABOLIC PANEL WITH GFR
ALT: 12 U/L (ref 0–44)
AST: 16 U/L (ref 15–41)
Albumin: 3.8 g/dL (ref 3.5–5.0)
Alkaline Phosphatase: 65 U/L (ref 38–126)
Anion gap: 8 (ref 5–15)
BUN: 6 mg/dL (ref 6–20)
CO2: 23 mmol/L (ref 22–32)
Calcium: 9.2 mg/dL (ref 8.9–10.3)
Chloride: 106 mmol/L (ref 98–111)
Creatinine, Ser: 0.86 mg/dL (ref 0.44–1.00)
GFR, Estimated: >60 mL/min (ref >60)
Glucose, Bld: 84 mg/dL (ref 70–99)
Potassium: 3.4 mmol/L — ABNORMAL LOW (ref 3.5–5.1)
Sodium: 137 mmol/L (ref 135–145)
Total Bilirubin: 0.7 mg/dL (ref 0.0–1.2)
Total Protein: 7.7 g/dL (ref 6.5–8.1)

## 2023-09-06 LAB — CBC WITH DIFFERENTIAL/PLATELET
Abs Immature Granulocytes: 0.03 10*3/uL (ref 0.00–0.07)
Basophils Absolute: 0 10*3/uL (ref 0.0–0.1)
Basophils Relative: 0 %
Eosinophils Absolute: 0.1 10*3/uL (ref 0.0–0.5)
Eosinophils Relative: 1 %
HCT: 39.5 % (ref 36.0–46.0)
Hemoglobin: 12.6 g/dL (ref 12.0–15.0)
Immature Granulocytes: 0 %
Lymphocytes Relative: 33 %
Lymphs Abs: 3.2 10*3/uL (ref 0.7–4.0)
MCH: 26.9 pg (ref 26.0–34.0)
MCHC: 31.9 g/dL (ref 30.0–36.0)
MCV: 84.2 fL (ref 80.0–100.0)
Monocytes Absolute: 0.5 10*3/uL (ref 0.1–1.0)
Monocytes Relative: 5 %
Neutro Abs: 5.9 10*3/uL (ref 1.7–7.7)
Neutrophils Relative %: 61 %
Platelets: 263 10*3/uL (ref 150–400)
RBC: 4.69 MIL/uL (ref 3.87–5.11)
RDW: 14.6 % (ref 11.5–15.5)
WBC: 9.7 10*3/uL (ref 4.0–10.5)
nRBC: 0 % (ref 0.0–0.2)

## 2023-09-06 MED ORDER — PROCHLORPERAZINE EDISYLATE 10 MG/2ML IJ SOLN
10.0000 mg | Freq: Once | INTRAMUSCULAR | Status: AC
  Administered 2023-09-06: 10 mg via INTRAVENOUS
  Filled 2023-09-06: qty 2

## 2023-09-06 MED ORDER — DEXAMETHASONE SODIUM PHOSPHATE 10 MG/ML IJ SOLN
10.0000 mg | Freq: Once | INTRAMUSCULAR | Status: AC
  Administered 2023-09-06: 10 mg via INTRAVENOUS
  Filled 2023-09-06: qty 1

## 2023-09-06 NOTE — Discharge Instructions (Signed)
 The blood work today all look normal.  Your CAT scan showed that everything with your shunt is working appropriately.  No signs of a heart attack or heart or lung problems.  Seems like this was a really bad migraine and maybe you had a reaction to the medication.  If your symptoms return or become worse or you pass out or have fever or new weakness, or one side of your body or trouble walking you should return to the emergency room.

## 2023-09-06 NOTE — ED Provider Notes (Signed)
 [place] EMERGENCY DEPARTMENT AT [facility] Provider Note   CSN: [id number] Arrival date & time: 09/06/23  1957     History  Chief Complaint  Patient presents with   Chest Pain    Tabitha Holland is a 38 y.o. female.  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  Patient reports for the last 3 days she has had a migraine headache that has been worsening.  She reports she has not had a headache like this since she has had her shunt placed which pretty much eliminated most of her headaches.  It is been mostly on the left side and on the top of her head.  She has had some nausea on occasion but worsened today with vomiting.  She reports when she woke up this morning her left eye vision was very blurry and off but she reports that can typically happen with her migraines so she did not give it much thought.  She has not had any unilateral numbness or weakness.  However because the headache was worsening she took Topamax  and acetazolamide  today which she reports she has not taken in quite some time because the medication usually makes her feel bad in the past has caused her to be short of breath.  However she developed shortness of breath while she was at work and chest tightness.  sHe got worse anytime she sat up or tried to walk.  She reports this feels different than the prior times the medicines caused her to have a panic attack.  She reports now as long as she is laying down she feels okay.  She has had no unilateral leg pain or swelling, recent trips or any immobilization.  She has never had blood clots but is adopted and does not know her family history.  Denies any abdominal pain, fever, cough or URI symptoms.  The  history is provided by the patient and medical records.  Chest Pain      Home Medications Prior to Admission medications   Medication Sig Start Date End Date Taking? Authorizing Provider  acetaZOLAMIDE  (DIAMOX ) 250 MG tablet Take 4 tablets (1,000 mg total) by mouth 2 (two) times daily. 01/29/23 05/29/23  Camara, Amadou, MD  buPROPion  (WELLBUTRIN  XL) 150 MG 24 hr tablet Take 1 tablet (150 mg total) by mouth daily. 02/23/23   Limmie Ren, MD  cloNIDine  (CATAPRES  - DOSED IN MG/24 HR) 0.1 mg/24hr patch Place 1 patch (0.1 mg total) onto the skin once a week. 05/24/23   Limmie Ren, MD  famotidine  (PEPCID ) 20 MG tablet Take 20 mg by mouth daily as needed for heartburn or indigestion. Patient not taking: Reported on 07/03/2023    [provider]  FLUoxetine  (PROZAC ) 40 MG capsule Take 1 capsule (40 mg total) by mouth daily. 02/23/23   Limmie Ren, MD  ketorolac  (TORADOL ) 10 MG tablet Take 1 tablet (10 mg total) by mouth every 8 (eight) hours as needed for severe pain (pain score 7-10). 04/26/23   Limmie Ren, MD  LORazepam  (ATIVAN ) 1 MG tablet Take 1 tablet (1 mg total) by mouth 2 (two) times daily as needed for anxiety. TAKE 1 TABLET(1 MG) BY MOUTH TWICE DAILY AS NEEDED FOR ANXIETY OR PANIC Strength: 1 mg  Patient taking differently: Take 1 mg by mouth 2 (two) times daily as needed for anxiety (or panic). 02/23/23   Limmie Ren, MD  methocarbamol  (ROBAXIN ) 500 MG tablet Take 500 mg by mouth every 8 (eight) hours as needed for muscle spasms.    [provider]  Olmesartan -amLODIPine -HCTZ 40-5-25 MG TABS Take 1 tablet by mouth daily. 01/26/23   Limmie Ren, MD  Ruxolitinib  Phosphate (OPZELURA ) 1.5 % CREA Apply 1 Application topically daily as needed (eczema).    [provider]  Semaglutide -Weight Management 1.7 MG/0.75ML SOAJ Inject 1.7 mg into the skin once a week. 07/25/23   Albin Huh, MD  spironolactone  (ALDACTONE ) 50 MG tablet TAKE 1 TABLET(50  MG) BY MOUTH AT BEDTIME Patient taking differently: Take 50 mg by mouth daily. 08/30/22   Limmie Ren, MD  topiramate  (TOPAMAX ) 200 MG tablet Take 1 tablet (200 mg total) by mouth 2 (two) times daily. 01/29/23 08/27/23  Cassandra Cleveland, MD  TYLENOL  500 MG tablet Take 500-1,000 mg by mouth every 6 (six) hours as needed for mild pain (pain score 1-3) or headache.    [provider]      Allergies    Butalbital , Codeine, Fish allergy, Hydrocodone , Latex, Percocet [oxycodone -acetaminophen ], and Shellfish allergy    Review of Systems   Review of Systems  Cardiovascular:  Positive for chest pain.    Physical Exam Updated Vital Signs BP (!) 157/103   Pulse 63   Temp 98.3 F (36.8 C)   Resp 18   Ht 5\' 11"  (1.803 m)   Wt (!) 169 kg   SpO2 100%   BMI 51.96 kg/m  Physical Exam Vitals and nursing note reviewed.  Constitutional:      General: She is not in acute distress.    Appearance: She is well-developed.  HENT:     Head: Normocephalic and atraumatic.     Right Ear: Tympanic membrane normal.     Left Ear: Tympanic membrane normal.  Eyes:     Extraocular Movements: Extraocular movements intact.     Pupils: Pupils are equal, round, and reactive to light.     Comments: Pupils are briskly reactive bilaterally.  Normal consensual light reflex  Cardiovascular:     Rate and Rhythm: Normal rate and regular rhythm.     Heart sounds: Normal heart sounds. No murmur heard.    No friction rub.  Pulmonary:     Effort: Pulmonary effort is normal.     Breath sounds: Normal breath sounds. No wheezing or rales.  Abdominal:     General: Bowel sounds are normal. There is no distension.     Palpations: Abdomen is soft.     Tenderness: There is no abdominal tenderness. There is no guarding or rebound.  Musculoskeletal:        General: No tenderness. Normal range of motion.     Right lower leg: No edema.     Left lower leg: No edema.     Comments: No edema  Skin:    General: Skin is  warm and dry.     Findings: No rash.  Neurological:     Mental Status: She is alert and oriented to person, place, and time. Mental status is at baseline.     Cranial Nerves: No cranial nerve deficit.     Sensory: No sensory deficit.     Motor: No weakness.     Coordination: Coordination normal.  Psychiatric:        Mood and Affect:  Mood normal.        Behavior: Behavior normal.     ED Results / Procedures / Treatments   Labs (all labs ordered are listed, but only abnormal results are displayed) Labs Reviewed  COMPREHENSIVE METABOLIC PANEL WITH GFR - Abnormal; Notable for the following components:      Result Value   Potassium 3.4 (*)    All other components within normal limits  CBC WITH DIFFERENTIAL/PLATELET  TROPONIN I (HIGH SENSITIVITY)  TROPONIN I (HIGH SENSITIVITY)    EKG EKG Interpretation Date/Time:  Thursday Sep 06 2023 20:10:32 EDT Ventricular Rate:  57 PR Interval:  211 QRS Duration:  161 QT Interval:  466 QTC Calculation: 454 R Axis:   27  Text Interpretation: Sinus rhythm Prolonged PR interval Nonspecific intraventricular conduction delay Borderline ST depression, inferior leads Artifact in lead(s) I III aVR aVL aVF Confirmed by Almond Army ([id number]) on 09/06/2023 8:20:10 PM  Radiology CT Head Wo Contrast Result Date: 09/06/2023 CLINICAL DATA:  Headache, ventricular shunt EXAM: CT HEAD WITHOUT CONTRAST TECHNIQUE: Contiguous axial images were obtained from the base of the skull through the vertex without intravenous contrast. RADIATION DOSE REDUCTION: This exam was performed according to the departmental dose-optimization program which includes automated exposure control, adjustment of the mA and/or kV according to patient size and/or use of iterative reconstruction technique. COMPARISON:  04/21/2023 FINDINGS: Brain: Stable appearance of the right frontal ventriculostomy shunt catheter with the tip in the midline of the ventricular system as before. Ventricles  are decompressed. No hydrocephalus. No acute intracranial hemorrhage, mass lesion, new infarction midline shift, herniation, or extra-axial fluid collection. No focal mass effect or edema. Cisterns are patent. No cerebellar abnormality. Vascular: No hyperdense vessel or unexpected calcification. Skull: Normal. Negative for fracture or focal lesion. Sinuses/Orbits: No acute finding. Other: None. IMPRESSION: 1. Stable CT head. No acute intracranial abnormality by noncontrast CT. 2. Stable right frontal ventriculostomy shunt catheter with decompressed ventricles. Electronically Signed   By: Melven Stable.  Shick M.D.   On: 09/06/2023 21:37   DG Chest Port 1 View Result Date: 09/06/2023 CLINICAL DATA:  Chest pain and shortness of breath EXAM: PORTABLE CHEST 1 VIEW COMPARISON:  04/21/2023 FINDINGS: The heart size and mediastinal contours are within normal limits. Both lungs are clear. The visualized skeletal structures are unremarkable. Shunt tubing noted again over the right neck and chest. Telemetry leads overlie the chest. Trachea midline IMPRESSION: No active disease. Electronically Signed   By: Melven Stable.  Shick M.D.   On: 09/06/2023 21:34    Procedures Procedures    Medications Ordered in ED Medications  prochlorperazine  (COMPAZINE ) injection 10 mg (10 mg Intravenous Given 09/06/23 2220)  dexamethasone  (DECADRON ) injection 10 mg (10 mg Intravenous Given 09/06/23 2220)    ED Course/ Medical Decision Making/ A&P                                 Medical Decision Making Amount and/or Complexity of Data Reviewed External Data Reviewed: notes. Labs: ordered. Decision-making details documented in ED Course. Radiology: ordered and independent interpretation performed. Decision-making details documented in ED Course. ECG/medicine tests: ordered and independent interpretation performed. Decision-making details documented in ED Course.  Risk Prescription drug management.   Pt with multiple medical problems and  comorbidities and presenting today with a complaint that caries a high risk for morbidity and mortality.  Here today with the above symptoms and concern for possible shunt malfunction versus severe migraine headache.  Also concern for possible anemia, ACS, pneumothorax.  Low suspicion for pulmonary edema or dissection.  Also low suspicion for PE as patient is PERC negative.  EKG with significant artifact but no acute findings.  Patient given a headache cocktail.  Neurologically intact at this time and satting at 100% on room air.  11:07 PM I independently interpreted patient's labs and CBC, CMP, troponin are within normal limits.  I have independently visualized and interpreted pt's images today.  Head CT without evidence of hydrocephalus.  Radiology reports stable right frontal ventriculostomy shunt catheter with decompressed ventricles and no acute findings.  Chest x-ray is within normal limits.  On repeat evaluation after patient has had a headache cocktail she reports she is feeling much better.  Vision in her left eye has returned.  The chest pain and shortness of breath has resolved.  Suspect this most likely from one of the medication she had taken.  Low suspicion at this time for ACS, dissection, other acute cardiac or lung pathology.  At this time feel that patient is stable for discharge home.  Discussed the findings with her and she feels comfortable with this plan.          Final Clinical Impression(s) / ED Diagnoses Final diagnoses:  Other migraine with status migrainosus, intractable  Nonspecific chest pain    Rx / DC Orders ED Discharge Orders     None         Almond Army, MD 09/06/23 2307

## 2023-09-06 NOTE — ED Triage Notes (Signed)
 Pt BIB EMS with c/o worsening chest tightness and SHOB with a near syncopal episode while moving around since 1600. Also c/o migraine with nausea that started today, had a shunt placed in December, sees neurology here; hasn't had a migraine since shunt placement.  164/98 66HR 18RR 99 RA  22g LAC  4mg  zofran  IV given by EMS

## 2023-09-11 ENCOUNTER — Encounter: Payer: Self-pay | Admitting: Student

## 2023-09-11 ENCOUNTER — Ambulatory Visit (INDEPENDENT_AMBULATORY_CARE_PROVIDER_SITE_OTHER): Admitting: Student

## 2023-09-11 ENCOUNTER — Encounter (HOSPITAL_BASED_OUTPATIENT_CLINIC_OR_DEPARTMENT_OTHER): Payer: Self-pay

## 2023-09-11 ENCOUNTER — Ambulatory Visit (HOSPITAL_BASED_OUTPATIENT_CLINIC_OR_DEPARTMENT_OTHER)
Admission: RE | Admit: 2023-09-11 | Discharge: 2023-09-11 | Disposition: A | Discharge status: Home / Self Care | Source: Ambulatory Visit | Attending: Family Medicine | Admitting: Family Medicine

## 2023-09-11 VITALS — BP 130/98 | HR 72 | Ht 71.0 in | Wt 342.6 lb

## 2023-09-11 DIAGNOSIS — R1012 Left upper quadrant pain: Secondary | ICD-10-CM | POA: Insufficient documentation

## 2023-09-11 DIAGNOSIS — R112 Nausea with vomiting, unspecified: Secondary | ICD-10-CM | POA: Insufficient documentation

## 2023-09-11 DIAGNOSIS — F430 Acute stress reaction: Secondary | ICD-10-CM

## 2023-09-11 MED ORDER — ONDANSETRON 4 MG PO TBDP: 4.0000 mg | ORAL_TABLET | Freq: Three times a day (TID) | ORAL | 0 refills | Status: AC | PRN

## 2023-09-11 MED ORDER — LORAZEPAM 1 MG PO TABS
1.0000 mg | ORAL_TABLET | Freq: Two times a day (BID) | ORAL | 0 refills | Status: DC | PRN
Start: 2023-09-11 — End: 2024-03-24

## 2023-09-11 MED ORDER — IOHEXOL 300 MG/ML  SOLN
100.0000 mL | Freq: Once | INTRAMUSCULAR | Status: AC | PRN
  Administered 2023-09-11: 100 mL via INTRAVENOUS

## 2023-09-11 NOTE — Patient Instructions (Addendum)
 Tabitha Holland to see you! Sorry you are having these symptoms. Let's dig in with labs today and then a STAT CT scan, hopefully tomorrow. I will call you if we need to do anything more urgently. If you have blood in your stool or the pain gets much worse, please go to the ER. I am refilling your ativan .    Alexa Andrews, MD

## 2023-09-11 NOTE — Progress Notes (Cosign Needed Addendum)
    SUBJECTIVE:   CHIEF COMPLAINT / HPI:   Tabitha Holland is a 38 year old female with a history of migraines and hernia who presents with abdominal pain and vomiting. She is accompanied by her wife, Tabitha Holland.  She experiences severe cramping on the left side of her abdomen, described as a knot that becomes increasingly difficult to release. The pain is crippling and requires pressure to be applied until a 'pop' is felt, which provides relief. This issue has been persistent and worsening over time. She has a known hernia in the area, raising concerns about possible complications.  She has been unable to keep food down since Thursday, relying on chicken broth for sustenance. She has not been able to take her medications regularly due to vomiting. She has lost approximately 30 pounds over the past two months, which she attributes to her use of Wegovy . No fevers or chills are reported, but she experiences vomiting whenever she attempts to eat or drink anything.  She experienced a severe headache that developed from a slight headache on Tuesday to a full-fledged migraine by Thursday. She took half doses of Diamox  and Topamax  due to the migraine, which led to difficulty breathing and an emergency room visit. This was the first significant migraine since her surgery, indicating overall improvement in her condition. Of note, did have CT Head that showed proper placement of VP shunt with decompressed ventricles.   On Friday, she experienced a panic attack at work. She has run out of Ativan , which she uses to manage these episodes.   PERTINENT  PMH / PSH: IIH s/p VP Shunt, Panic Attacks  OBJECTIVE:   BP (!) 130/98   Pulse 72   Ht 5\' 11"  (1.803 m)   Wt (!) 342 lb 9.6 oz (155.4 kg)   LMP 08/18/2023   SpO2 92%   BMI 47.78 kg/m   Gen: Well-appearing and NAD  HENT: MMM, EOM intact Cardio: RRR, no murmur Pulm: Normal WOB on RA, speaking in full sentences Abd: Supraumbilical tenderness that  extends across upper abdomen on the Left. I am unable to palpate a discrete mass but exam severely limited by patient's body habitus. There is no change to the overlying skin. There is voluntary guarding, but no involuntary guarding or rebound tenderness appreciated.   ASSESSMENT/PLAN:   Assessment & Plan Left upper quadrant abdominal pain Given known supraumbilical hernia and VP shunt in place, differential includes bowel incarceration and VP shunt issues. Though would expect shunt issues to have reflected poor drainage on CT Head obtained in the ER. She is hemodynamically stable and not in extremis. Stable for urgent outpatient evaluation.  - CBC, CMP, Lipase, Lactic acid - STAT CT Abd/Pelv w contrast, to be done this evening - Would need urgent/emergent surgical evaluation if bowel incarceration is suspected on CT  - Weight loss will reduce risk of hernia recurrence if surgery is warranted  Nausea and vomiting, unspecified vomiting type Clinically well-hydrated on exam.  - Zofran  for symptom control - Workup as above  Acute stress disorder With panic attacks. Needs refill of Ativan . - Ativan  1mg , 6 tablets refilled      J Lark Plum, MD [facility]

## 2023-09-12 ENCOUNTER — Ambulatory Visit: Payer: Self-pay | Admitting: Student

## 2023-09-13 LAB — COMPREHENSIVE METABOLIC PANEL WITH GFR
ALT: 11 IU/L (ref 0–32)
AST: 13 IU/L (ref 0–40)
Albumin: 4.4 g/dL (ref 3.9–4.9)
Alkaline Phosphatase: 98 IU/L (ref 44–121)
BUN/Creatinine Ratio: 13 (ref 9–23)
BUN: 11 mg/dL (ref 6–20)
Bilirubin Total: 0.5 mg/dL (ref 0.0–1.2)
CO2: 20 mmol/L (ref 20–29)
Calcium: 9.4 mg/dL (ref 8.7–10.2)
Chloride: 101 mmol/L (ref 96–106)
Creatinine, Ser: 0.83 mg/dL (ref 0.57–1.00)
Globulin, Total: 3.1 g/dL (ref 1.5–4.5)
Glucose: 89 mg/dL (ref 70–99)
Potassium: 3.6 mmol/L (ref 3.5–5.2)
Sodium: 139 mmol/L (ref 134–144)
Total Protein: 7.5 g/dL (ref 6.0–8.5)
eGFR: 94 mL/min/{1.73_m2} (ref >59)

## 2023-09-13 LAB — CBC
Hematocrit: 40.5 % (ref 34.0–46.6)
Hemoglobin: 12.7 g/dL (ref 11.1–15.9)
MCH: 26.5 pg — ABNORMAL LOW (ref 26.6–33.0)
MCHC: 31.4 g/dL — ABNORMAL LOW (ref 31.5–35.7)
MCV: 85 fL (ref 79–97)
Platelets: 268 10*3/uL (ref 150–450)
RBC: 4.79 x10E6/uL (ref 3.77–5.28)
RDW: 14.9 % (ref 11.7–15.4)
WBC: 7.3 10*3/uL (ref 3.4–10.8)

## 2023-09-13 LAB — LIPASE: Lipase: 22 U/L (ref 14–72)

## 2023-09-13 LAB — LACTIC ACID, PLASMA: Lactate, Ven: 9.4 mg/dL (ref 4.8–25.7)

## 2023-09-14 ENCOUNTER — Other Ambulatory Visit: Payer: Self-pay | Admitting: Student

## 2023-09-14 DIAGNOSIS — K439 Ventral hernia without obstruction or gangrene: Secondary | ICD-10-CM

## 2023-09-20 ENCOUNTER — Telehealth: Payer: Self-pay

## 2023-09-20 NOTE — Telephone Encounter (Signed)
 Pharmacy Patient Advocate Encounter  Received notification from [facility] that Prior Authorization for WEGOVY  1.7MG  has been DENIED.  Full denial letter will be uploaded to the media tab. See denial reason below.    Per chart notes: Baseline weight 03/16/23 was 365lbs Current weight 07/25/23 was 373lbs  No current weight loss, patient will need to continue with starting dose.   PA #/Case ID/Reference #: [id number]

## 2023-09-20 NOTE — Telephone Encounter (Signed)
 Pharmacy Patient Advocate Encounter   Received notification from CoverMyMeds that prior authorization for WEGOVY  1.7MG  is required/requested.   Insurance verification completed.   The patient is insured through [facility] .   PA required; PA submitted to above mentioned insurance via CoverMyMeds Key/confirmation #/EOC [id number]. Status is pending

## 2023-09-27 ENCOUNTER — Encounter: Payer: Self-pay | Admitting: Student

## 2023-10-01 ENCOUNTER — Telehealth: Payer: Self-pay | Admitting: Student

## 2023-10-01 ENCOUNTER — Other Ambulatory Visit: Payer: Self-pay | Admitting: Student

## 2023-10-01 MED ORDER — SEMAGLUTIDE-WEIGHT MANAGEMENT 1.7 MG/0.75ML ~~LOC~~ SOAJ: 1.7000 mg | SUBCUTANEOUS | 3 refills | Status: DC

## 2023-10-01 NOTE — Telephone Encounter (Signed)
 Called patient's insurance to follow-up on prior authorization for Wegovy  1.7mg /week.  Gave updated information about patient's weight loss. (Most recent weight we have on file was 342 at our visit on 09/11/23).  They have reopened case with Case ID [id number]   Will continue to monitor progress of PA.  Alexa Andrews, MD

## 2023-10-02 NOTE — Telephone Encounter (Signed)
 Pharmacy Patient Advocate Encounter  Received notification from [facility] that Prior Authorization for WEGOVY  1.7MG  has been APPROVED from 10/01/23 to 09/30/24

## 2023-10-09 ENCOUNTER — Encounter: Payer: Self-pay | Admitting: *Deleted

## 2023-10-17 ENCOUNTER — Ambulatory Visit: Admitting: Student

## 2023-10-17 VITALS — BP 118/72 | HR 84 | Wt 353.0 lb

## 2023-10-17 DIAGNOSIS — S99921A Unspecified injury of right foot, initial encounter: Secondary | ICD-10-CM | POA: Diagnosis present

## 2023-10-17 MED ORDER — WEGOVY 2.4 MG/0.75ML ~~LOC~~ SOAJ: 2.4000 mg | SUBCUTANEOUS | 2 refills | Status: DC

## 2023-10-17 NOTE — Patient Instructions (Addendum)
 Tabitha Holland,  Happy Birthday! I am putting you in a post-op shoe for the next 1-2 weeks until you're feeling better. Let me know if you feel like we need to get Xrays. I wouldn't need to see you in person. Obviously come back if things get worse.   Alexa Andrews, MD

## 2023-10-17 NOTE — Progress Notes (Signed)
    SUBJECTIVE:   CHIEF COMPLAINT / HPI:   Right Great Toe Pain Dropped a soup can on her right great toe last night.  Having pain and swelling in the area at this time.  Tells me that she has a history of breaking this toe in the remote past.  She is able to bear weight on it and flex and extend it, albeit with pain.  Weight Loss Efforts She is frustrated that her weight is now up 11 pounds after she missed a few doses of her Wegovy  due to a lapse in insurance coverage.  She has been back on the 1.7 mg/week dose of her Wegovy  for a while now.  She is interested in increasing her dose to promote further weight loss.  She is very pleased to see that her blood pressure is again much improved with her weight loss.   OBJECTIVE:   BP 118/72   Pulse 84   Wt (!) 353 lb (160.1 kg)   LMP 08/25/2023   BMI 49.23 kg/m   General: In good spirits and NAD Cardio: Regular rate and rhythm Pulm: Normal work of breathing on room air, speaking in full sentences MSK: Right great toe is diffusely swollen when compared to the left.  There is no obvious bruising, deformity, or rotation.  The toe is generally tender without an obvious focal point of tenderness.  ASSESSMENT/PLAN:   Assessment & Plan Injury of right great toe, initial encounter Flexor and extensor tendons appear to be intact.  No obvious rotation or deformity on exam.  Patient is ambulatory.  Patient prefers not to have x-ray.  Will therefore place in a postop shoe for support and have her follow-up as needed. -Postop shoe Morbid obesity (HCC) 11 pound weight gain with few missed doses of her Wegovy , is now back on 1.7 mg weekly.  I do suspect that she would benefit from further dose increase.  Even with the recent 11 pound weight gain, she is still down 27 pounds since March.  Blood pressure is much improved with this weight loss. -Increase Wegovy  to 2.4 mg weekly -Continue to pursue an active lifestyle, she is walking as much as she is  able.  Resistance training would be helpful in attenuating any muscle loss related to Wegovy .     J Lark Plum, MD [facility]

## 2023-10-17 NOTE — Assessment & Plan Note (Addendum)
 11 pound weight gain with few missed doses of her Wegovy , is now back on 1.7 mg weekly.  I do suspect that she would benefit from further dose increase.  Even with the recent 11 pound weight gain, she is still down 27 pounds since March.  Blood pressure is much improved with this weight loss. -Increase Wegovy  to 2.4 mg weekly -Continue to pursue an active lifestyle, she is walking as much as she is able.  Resistance training would be helpful in attenuating any muscle loss related to Wegovy .

## 2023-10-25 ENCOUNTER — Telehealth: Payer: Self-pay

## 2023-10-25 ENCOUNTER — Other Ambulatory Visit (HOSPITAL_COMMUNITY): Payer: Self-pay

## 2023-10-25 NOTE — Telephone Encounter (Signed)
 Pharmacy Patient Advocate Encounter  Received notification from [facility] that Prior Authorization for wegovy  2.4mg  has been CANCELLED due to  The medication you are requesting has already been approved on 10-01-2023. If the pharmacy is having issues processing the claim, please have them call Pharmacy Services for assistance at [phone].

## 2023-10-25 NOTE — Telephone Encounter (Signed)
 Pharmacy Patient Advocate Encounter   Received notification from Patient Pharmacy that prior authorization for WEGOVY  2.4MG  is required/requested.   Insurance verification completed.   The patient is insured through [facility] .   PA required; PA submitted to above mentioned insurance via CoverMyMeds Key/confirmation #/EOC [id number]. Status is pending

## 2023-11-12 ENCOUNTER — Emergency Department (HOSPITAL_COMMUNITY)
Admit: 2023-11-12 | Discharge: 2023-11-12 | Disposition: A | Discharge status: Home / Self Care | Attending: Emergency Medicine | Admitting: Emergency Medicine

## 2023-11-12 ENCOUNTER — Other Ambulatory Visit: Payer: Self-pay

## 2023-11-12 ENCOUNTER — Emergency Department (HOSPITAL_COMMUNITY)
Admission: EM | Admit: 2023-11-12 | Discharge: 2023-11-12 | Disposition: A | Discharge status: Home / Self Care | Attending: Emergency Medicine | Admitting: Emergency Medicine

## 2023-11-12 ENCOUNTER — Encounter (HOSPITAL_COMMUNITY): Payer: Self-pay

## 2023-11-12 DIAGNOSIS — R2241 Localized swelling, mass and lump, right lower limb: Secondary | ICD-10-CM | POA: Diagnosis not present

## 2023-11-12 DIAGNOSIS — M7989 Other specified soft tissue disorders: Secondary | ICD-10-CM | POA: Diagnosis not present

## 2023-11-12 DIAGNOSIS — M79604 Pain in right leg: Secondary | ICD-10-CM | POA: Diagnosis present

## 2023-11-12 DIAGNOSIS — Z91040 Latex allergy status: Secondary | ICD-10-CM | POA: Diagnosis not present

## 2023-11-12 LAB — CBC
HCT: 34.9 % — ABNORMAL LOW (ref 36.0–46.0)
Hemoglobin: 11.2 g/dL — ABNORMAL LOW (ref 12.0–15.0)
MCH: 27.7 pg (ref 26.0–34.0)
MCHC: 32.1 g/dL (ref 30.0–36.0)
MCV: 86.2 fL (ref 80.0–100.0)
Platelets: 197 K/uL (ref 150–400)
RBC: 4.05 MIL/uL (ref 3.87–5.11)
RDW: 14.4 % (ref 11.5–15.5)
WBC: 6.6 K/uL (ref 4.0–10.5)
nRBC: 0 % (ref 0.0–0.2)

## 2023-11-12 LAB — BASIC METABOLIC PANEL WITH GFR
Anion gap: 10 (ref 5–15)
BUN: <5 mg/dL — ABNORMAL LOW (ref 6–20)
CO2: 23 mmol/L (ref 22–32)
Calcium: 8.6 mg/dL — ABNORMAL LOW (ref 8.9–10.3)
Chloride: 104 mmol/L (ref 98–111)
Creatinine, Ser: 0.66 mg/dL (ref 0.44–1.00)
GFR, Estimated: >60 mL/min (ref >60)
Glucose, Bld: 80 mg/dL (ref 70–99)
Potassium: 3.8 mmol/L (ref 3.5–5.1)
Sodium: 137 mmol/L (ref 135–145)

## 2023-11-12 MED ORDER — GABAPENTIN 100 MG PO TABS: 100.0000 mg | ORAL_TABLET | Freq: Three times a day (TID) | ORAL | 0 refills | Status: DC

## 2023-11-12 MED ORDER — GABAPENTIN 100 MG PO TABS: 100.0000 mg | ORAL_TABLET | Freq: Three times a day (TID) | ORAL | 11 refills | Status: DC

## 2023-11-12 NOTE — ED Provider Triage Note (Signed)
 Emergency Medicine Provider Triage Evaluation Note  Tabitha Holland , a 38 y.o. female  was evaluated in triage.  Pt complains of right lower extremity pain/swelling.  Patient was bit by a bug several weeks ago, she is unsure exactly what bit her but several days later she began to have worsening right lower extremity pain, pain has been building up and getting worse, stating it feels like there is a torch being held to my leg.  No history of blood clots, no blood thinners.  Review of Systems  Positive: As above Negative: As above  Physical Exam  BP (!) 151/98 (BP Location: Right Arm)   Pulse 62   Temp 98.8 F (37.1 C)   Resp 18   Ht 5' 11 (1.803 m)   Wt (!) 154.2 kg   LMP 08/25/2023   SpO2 98%   BMI 47.42 kg/m  Gen:   Awake, no distress   Resp:  Normal effort  MSK:   Moves extremities without difficulty  Other:  Right lower extremity is diffusely tender to palpation but worse in the lower shin with palpable swelling of the right lateral leg with some erythema noted  Medical Decision Making  Medically screening exam initiated at 3:02 PM.  Appropriate orders placed.  Tabitha Holland was informed that the remainder of the evaluation will be completed by another provider, this initial triage assessment does not replace that evaluation, and the importance of remaining in the ED until their evaluation is complete.     Tabitha Holland, [place] 11/12/23 1506

## 2023-11-12 NOTE — ED Triage Notes (Addendum)
 Pt came in via POV d/t a couple of weeks ago she was bit by something on her lower Rt leg on the outer Rt side, above ankle. Pt endorses since then she feels like her skin itself hurts & it is painful to walk on it & the area she was bit feels tight & the top of her Rt foot burns. A/Ox4, rates her pain 7/10 during triage.

## 2023-11-12 NOTE — ED Provider Notes (Signed)
 [place] EMERGENCY DEPARTMENT AT [facility] Provider Note   CSN: [id number] Arrival date & time: 11/12/23  1318     Patient presents with: Insect Bite   Tabitha Holland is a 38 y.o. female.   Patient here with burning to the right leg.  Started after maybe getting bit by an insect 2 weeks ago.  She has a nodule now in the right ankle area.  Denies any fevers or chills.  Nothing makes it worse or better.  She denies any back pain.  She denies any loss of bowel or bladder.  She denies any chest pain shortness of breath weakness numbness tingling.  History of hypertension anxiety depression.  No history of diabetes.  The history is provided by the patient.       Prior to Admission medications   Medication Sig Start Date End Date Taking? Authorizing Provider  acetaZOLAMIDE  (DIAMOX ) 250 MG tablet Take 4 tablets (1,000 mg total) by mouth 2 (two) times daily. 01/29/23 05/29/23  Camara, Amadou, MD  buPROPion  (WELLBUTRIN  XL) 150 MG 24 hr tablet Take 1 tablet (150 mg total) by mouth daily. 02/23/23   Marlee Lynwood NOVAK, MD  cloNIDine  (CATAPRES  - DOSED IN MG/24 HR) 0.1 mg/24hr patch Place 1 patch (0.1 mg total) onto the skin once a week. 05/24/23   Marlee Lynwood NOVAK, MD  famotidine  (PEPCID ) 20 MG tablet Take 20 mg by mouth daily as needed for heartburn or indigestion. Patient not taking: Reported on 07/03/2023    [provider]  FLUoxetine  (PROZAC ) 40 MG capsule Take 1 capsule (40 mg total) by mouth daily. 02/23/23   Marlee Lynwood NOVAK, MD  gabapentin  (GABARONE ) 100 MG tablet Take 1 tablet (100 mg total) by mouth 3 (three) times daily for 14 days. 11/12/23 11/26/23  Taiwan Talcott, DO  ketorolac  (TORADOL ) 10 MG tablet Take 1 tablet (10 mg total) by mouth every 8 (eight) hours as needed for severe pain (pain score 7-10). 04/26/23   Marlee Lynwood NOVAK, MD  LORazepam  (ATIVAN ) 1 MG tablet Take 1 tablet (1 mg total) by mouth 2 (two) times daily as needed for anxiety. TAKE 1 TABLET(1 MG) BY  MOUTH TWICE DAILY AS NEEDED FOR ANXIETY OR PANIC Strength: 1 mg 09/11/23   Marlee Lynwood NOVAK, MD  methocarbamol  (ROBAXIN ) 500 MG tablet Take 500 mg by mouth every 8 (eight) hours as needed for muscle spasms.    [provider]  Olmesartan -amLODIPine -HCTZ 40-5-25 MG TABS Take 1 tablet by mouth daily. 01/26/23   Marlee Lynwood NOVAK, MD  ondansetron  (ZOFRAN -ODT) 4 MG disintegrating tablet Take 1 tablet (4 mg total) by mouth every 8 (eight) hours as needed for nausea or vomiting. 09/11/23   Marlee Lynwood NOVAK, MD  Ruxolitinib  Phosphate (OPZELURA ) 1.5 % CREA Apply 1 Application topically daily as needed (eczema).    [provider]  Semaglutide -Weight Management (WEGOVY ) 2.4 MG/0.75ML SOAJ Inject 2.4 mg into the skin once a week. 10/17/23   Marlee Lynwood NOVAK, MD  spironolactone  (ALDACTONE ) 50 MG tablet TAKE 1 TABLET(50 MG) BY MOUTH AT BEDTIME Patient taking differently: Take 50 mg by mouth daily. 08/30/22   Marlee Lynwood NOVAK, MD  topiramate  (TOPAMAX ) 200 MG tablet Take 1 tablet (200 mg total) by mouth 2 (two) times daily. 01/29/23 08/27/23  Gregg Lek, MD  TYLENOL  500 MG tablet Take 500-1,000 mg by mouth every 6 (six) hours as needed for mild pain (pain score 1-3) or headache.    [provider]    Allergies: Butalbital ,  Codeine, Fish allergy, Hydrocodone , Latex, Percocet [oxycodone -acetaminophen ], and Shellfish allergy    Review of Systems  Updated Vital Signs BP (!) 156/109   Pulse (!) 54   Temp 97.6 F (36.4 C) (Oral)   Resp 20   Ht 5' 11 (1.803 m)   Wt (!) 154.2 kg   LMP 08/25/2023   SpO2 100%   BMI 47.42 kg/m   Physical Exam Vitals and nursing note reviewed.  Constitutional:      General: She is not in acute distress.    Appearance: She is well-developed.  HENT:     Head: Normocephalic and atraumatic.  Eyes:     Extraocular Movements: Extraocular movements intact.     Conjunctiva/sclera: Conjunctivae normal.     Pupils: Pupils are equal, round, and reactive to  light.  Cardiovascular:     Rate and Rhythm: Normal rate and regular rhythm.     Pulses: Normal pulses.     Heart sounds: Normal heart sounds. No murmur heard. Pulmonary:     Effort: Pulmonary effort is normal. No respiratory distress.     Breath sounds: Normal breath sounds.  Abdominal:     Palpations: Abdomen is soft.     Tenderness: There is no abdominal tenderness.  Musculoskeletal:        General: Tenderness present. No swelling.     Cervical back: Normal range of motion and neck supple.     Comments: Tenderness around nodular area in the anterior right ankle, freely movable no redness no fluctuance  Skin:    General: Skin is warm and dry.     Capillary Refill: Capillary refill takes less than 2 seconds.  Neurological:     General: No focal deficit present.     Mental Status: She is alert and oriented to person, place, and time.     Motor: No weakness.  Psychiatric:        Mood and Affect: Mood normal.     (all labs ordered are listed, but only abnormal results are displayed) Labs Reviewed  CBC - Abnormal; Notable for the following components:      Result Value   Hemoglobin 11.2 (*)    HCT 34.9 (*)    All other components within normal limits  BASIC METABOLIC PANEL WITH GFR - Abnormal; Notable for the following components:   BUN <5 (*)    Calcium  8.6 (*)    All other components within normal limits    EKG: None  Radiology: VAS US  LOWER EXTREMITY VENOUS (DVT) (ONLY [facility] & [facility]) Result Date: 11/12/2023  Lower Venous DVT Study Patient Name:  Tabitha Holland  Date of Exam:   11/12/2023 Medical Rec #: [id number]           Accession #:    [id number] Date of Birth: Mar 03, 1986           Patient Gender: F Patient Age:   17 years Exam Location:  [facility] Procedure:      VAS US  LOWER EXTREMITY VENOUS (DVT) Referring Phys: ROCKY SCOTT --------------------------------------------------------------------------------  Indications: Swelling.  Risk Factors: None identified.  Limitations: Poor ultrasound/tissue interface and body habitus. Comparison Study: No prior studies. Performing Technologist: Cordella Collet RVT  Examination Guidelines: A complete evaluation includes B-mode imaging, spectral Doppler, color Doppler, and power Doppler as needed of all accessible portions of each vessel. Bilateral testing is considered an integral part of a complete examination. Limited examinations for reoccurring indications may be performed as noted. The reflux portion of the exam  is performed with the patient in reverse Trendelenburg.  +---------+---------------+---------+-----------+----------+--------------+ RIGHT    CompressibilityPhasicitySpontaneityPropertiesThrombus Aging +---------+---------------+---------+-----------+----------+--------------+ CFV      Full           Yes      Yes                                 +---------+---------------+---------+-----------+----------+--------------+ SFJ      Full                                                        +---------+---------------+---------+-----------+----------+--------------+ FV Prox  Full                                                        +---------+---------------+---------+-----------+----------+--------------+ FV Mid   Full                                                        +---------+---------------+---------+-----------+----------+--------------+ FV DistalFull                                                        +---------+---------------+---------+-----------+----------+--------------+ PFV      Full                                                        +---------+---------------+---------+-----------+----------+--------------+ POP      Full           Yes      Yes                                 +---------+---------------+---------+-----------+----------+--------------+ PTV      Full                                                         +---------+---------------+---------+-----------+----------+--------------+ PERO     Full                                                        +---------+---------------+---------+-----------+----------+--------------+   +----+---------------+---------+-----------+----------+--------------+ LEFTCompressibilityPhasicitySpontaneityPropertiesThrombus Aging +----+---------------+---------+-----------+----------+--------------+ CFV Full           Yes      Yes                                 +----+---------------+---------+-----------+----------+--------------+  Summary: RIGHT: - There is no evidence of deep vein thrombosis in the lower extremity.  - No cystic structure found in the popliteal fossa.  LEFT: - No evidence of common femoral vein obstruction.   *See table(s) above for measurements and observations.    Preliminary      Procedures   Medications Ordered in the ED - No data to display                                  Medical Decision Making Risk Prescription drug management.   Lela DELENA Albee is here with right leg pain.  Sounds like she got bit by an insect about 2 weeks ago and has been having some irritation in the right leg.  She states that she had a bite wound to the right ankle that was red and inflamed for a while but that is improved.  I do can palpate like a hard nodule/lipoma in this right distal ankle area.  However there is no fluctuance is no redness is no erythema there is no major tenderness.  She has been having so to allodynia symptoms around this with sensitivity to the skin.  Is not having any back pain.  Ultimately I do not think there is any infectious process.  I think that this area is likely a sequelae from what ever irritation was there 2 weeks ago.  Basic labs showed no significant leukocytosis anemia or electrolyte abnormality.  Have a DVT study done that showed no blood clot.  She has strong pulses in her legs.  Overall I think her  irritation in her leg is likely from the soft tissue lesion in her right anterior ankle area.  I have no concern for fracture or dislocation.  Ultimately will prescribe gabapentin  to see if she can get any symptomatic relief from that.  Recommend that she follow-up with orthopedics and her primary care doctor for further evaluation.  But I do not think any emergent process going on at this time.  Patient discharged in good condition.  Understands return precautions.  I think is reasonable for her to get follow-up with orthopedics or primary care to get maybe a biopsy of this area may be an MRI or some other advanced imaging to see what this might be.  This chart was dictated using voice recognition software.  Despite best efforts to proofread,  errors can occur which can change the documentation meaning.      Final diagnoses:  Pain of right lower extremity  Nodule of skin of right lower leg    ED Discharge Orders          Ordered    gabapentin  (GABARONE ) 100 MG tablet  3 times daily,   Status:  Discontinued        11/12/23 1733    gabapentin  (GABARONE ) 100 MG tablet  3 times daily        11/12/23 1733               Ruthe Cornet, DO 11/12/23 1738

## 2023-11-12 NOTE — Progress Notes (Signed)
 Right lower extremity venous duplex has been completed. Preliminary results can be found in CV Proc through chart review.  Results were given to Rocky Hamilton PA.  11/12/23 3:41 PM Cathlyn Collet RVT

## 2023-11-12 NOTE — Discharge Instructions (Addendum)
 Overall I think you have a soft tissue nodule in the right ankle area.  Follow-up with orthopedics and your primary care doctor.  Take gabapentin  to see if that can help with any discomfort you are having.  Recommend Tylenol  and ibuprofen  as needed as well.  Follow-up with your primary care doctor.

## 2023-12-10 ENCOUNTER — Other Ambulatory Visit: Payer: Self-pay

## 2023-12-10 ENCOUNTER — Emergency Department (HOSPITAL_COMMUNITY)
Admission: EM | Admit: 2023-12-10 | Discharge: 2023-12-11 | Discharge status: Against Medical Advice | Attending: Emergency Medicine | Admitting: Emergency Medicine

## 2023-12-10 DIAGNOSIS — R110 Nausea: Secondary | ICD-10-CM | POA: Diagnosis not present

## 2023-12-10 DIAGNOSIS — R102 Pelvic and perineal pain: Secondary | ICD-10-CM | POA: Insufficient documentation

## 2023-12-10 DIAGNOSIS — Z5321 Procedure and treatment not carried out due to patient leaving prior to being seen by health care provider: Secondary | ICD-10-CM | POA: Insufficient documentation

## 2023-12-10 LAB — CBC
HCT: 34.1 % — ABNORMAL LOW (ref 36.0–46.0)
Hemoglobin: 11.1 g/dL — ABNORMAL LOW (ref 12.0–15.0)
MCH: 27.8 pg (ref 26.0–34.0)
MCHC: 32.6 g/dL (ref 30.0–36.0)
MCV: 85.3 fL (ref 80.0–100.0)
Platelets: 204 K/uL (ref 150–400)
RBC: 4 MIL/uL (ref 3.87–5.11)
RDW: 14.1 % (ref 11.5–15.5)
WBC: 7.7 K/uL (ref 4.0–10.5)
nRBC: 0 % (ref 0.0–0.2)

## 2023-12-10 LAB — HCG, SERUM, QUALITATIVE: Preg, Serum: NEGATIVE

## 2023-12-10 LAB — COMPREHENSIVE METABOLIC PANEL WITH GFR
ALT: 9 U/L (ref 0–44)
AST: 13 U/L — ABNORMAL LOW (ref 15–41)
Albumin: 3.5 g/dL (ref 3.5–5.0)
Alkaline Phosphatase: 65 U/L (ref 38–126)
Anion gap: 9 (ref 5–15)
BUN: 6 mg/dL (ref 6–20)
CO2: 24 mmol/L (ref 22–32)
Calcium: 8.8 mg/dL — ABNORMAL LOW (ref 8.9–10.3)
Chloride: 105 mmol/L (ref 98–111)
Creatinine, Ser: 0.7 mg/dL (ref 0.44–1.00)
GFR, Estimated: >60 mL/min (ref >60)
Glucose, Bld: 85 mg/dL (ref 70–99)
Potassium: 3.1 mmol/L — ABNORMAL LOW (ref 3.5–5.1)
Sodium: 138 mmol/L (ref 135–145)
Total Bilirubin: 0.8 mg/dL (ref 0.0–1.2)
Total Protein: 6.8 g/dL (ref 6.5–8.1)

## 2023-12-10 LAB — URINALYSIS, ROUTINE W REFLEX MICROSCOPIC
Bilirubin Urine: NEGATIVE
Glucose, UA: NEGATIVE mg/dL
Hgb urine dipstick: NEGATIVE
Ketones, ur: NEGATIVE mg/dL
Leukocytes,Ua: NEGATIVE
Nitrite: NEGATIVE
Protein, ur: NEGATIVE mg/dL
Specific Gravity, Urine: 1.012 (ref 1.005–1.030)
pH: 5 (ref 5.0–8.0)

## 2023-12-10 LAB — LIPASE, BLOOD: Lipase: 26 U/L (ref 11–51)

## 2023-12-10 NOTE — ED Triage Notes (Signed)
 Pt has been having severe shooting pain in lower abd and pelvic pain. Denies urinary symptoms. Recently had heavy period. Denies vaginal bleeding or abnormal discharge. Also has nausea

## 2023-12-11 NOTE — ED Notes (Addendum)
 Called pt 4x in the span of 5 minutes, and no answer

## 2024-01-02 ENCOUNTER — Ambulatory Visit: Payer: Self-pay | Admitting: Family Medicine

## 2024-01-02 ENCOUNTER — Ambulatory Visit: Admitting: Family Medicine

## 2024-01-02 VITALS — BP 155/110 | HR 60 | Ht 71.0 in | Wt 329.0 lb

## 2024-01-02 DIAGNOSIS — F419 Anxiety disorder, unspecified: Secondary | ICD-10-CM | POA: Diagnosis not present

## 2024-01-02 DIAGNOSIS — Z6841 Body Mass Index (BMI) 40.0 and over, adult: Secondary | ICD-10-CM

## 2024-01-02 DIAGNOSIS — I10 Essential (primary) hypertension: Secondary | ICD-10-CM

## 2024-01-02 DIAGNOSIS — S0300XA Dislocation of jaw, unspecified side, initial encounter: Secondary | ICD-10-CM

## 2024-01-02 LAB — POCT GLYCOSYLATED HEMOGLOBIN (HGB A1C): Hemoglobin A1C: 5.1 % (ref 4.0–5.6)

## 2024-01-02 MED ORDER — OLMESARTAN-AMLODIPINE-HCTZ 40-5-25 MG PO TABS: 1.0000 | ORAL_TABLET | Freq: Every day | ORAL | 3 refills | Status: AC

## 2024-01-02 MED ORDER — CLONIDINE 0.1 MG/24HR TD PTWK: 0.1000 mg | MEDICATED_PATCH | TRANSDERMAL | 12 refills | Status: AC

## 2024-01-02 MED ORDER — HYDROXYZINE HCL 10 MG PO TABS: 10.0000 mg | ORAL_TABLET | Freq: Three times a day (TID) | ORAL | 0 refills | Status: DC | PRN

## 2024-01-02 MED ORDER — WEGOVY 2.4 MG/0.75ML ~~LOC~~ SOAJ: 2.4000 mg | SUBCUTANEOUS | 2 refills | Status: AC

## 2024-01-02 MED ORDER — ACETAZOLAMIDE 250 MG PO TABS: 1000.0000 mg | ORAL_TABLET | Freq: Two times a day (BID) | ORAL | 2 refills | Status: DC

## 2024-01-02 NOTE — Assessment & Plan Note (Signed)
 Elevated on repeat today to 155/110 in the setting of not taking antihypertensives recently.  Overall at baseline without new symptoms. -Discussed importance of antihypertensive regimen -Discussed restarting clonidine  patch, TriBenzor, Diamox  as previously prescribed.  Will trial holding off on spironolactone  for now to see if that helps with the increased urination.

## 2024-01-02 NOTE — Patient Instructions (Addendum)
 Thank you for visiting clinic today and allowing us  to participate in your care!  We restarted your Diamox , Combo BP pill, and clonidine  patch today. We are holding the spironolactone  to see if this helps with the frequency of urination.   Please try using Hydroxyzine  as needed for anxiety.   For TMJ, please see attached for supportive care and exercises you can do at home. You can use Ibuprofen  as needed for pain relief.   We also refilled your Wegovy .   Call  [phone] to schedule an appointment with your OBGYN provider at [facility].   Please schedule an appointment in 1 week for close follow up.   Reach out any time with any questions or concerns you may have - we are here for you!  Damien Cassis, MD [facility] [phone]

## 2024-01-02 NOTE — Progress Notes (Signed)
    SUBJECTIVE:   CHIEF COMPLAINT / HPI:   HTN  IIH - Has not taken any BP meds in a while, maybe a month or more - Reports the increased urination with BP meds interferes with work - Also has not taken Diamox  recently - New chest pain, difficulty breathing, vision changes, extremity swelling.  Reports feeling at baseline.  Has chronic left eye blurriness issues, follows with ophthalmology, no recent changes.  Anxiety  - On fluoxetine  40 daily, Ativan  as needed - Reports nervousness, increased heart rate episodes of panic that occur sometimes after taking medications, but not always.  Patient has been given Ativan  in the past to help with the symptoms.  But her symptoms occur or often then the amount of Ativan  she has been given.  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 Head: Tender to palpation of right temporomandibular joint.. CV: Normal S1/S2. No extra heart sounds. Warm and well-perfused. Pulm: Breathing comfortably on room air. CTAB. No increased WOB. Abd: Soft, non-tender, non-distended. Skin:  Warm, dry. Psych: Pleasant and appropriate.   ASSESSMENT/PLAN:   Assessment & Plan Primary hypertension Elevated on repeat today to 155/110 in the setting of not taking antihypertensives recently.  Overall at baseline without new symptoms. -Discussed importance of antihypertensive regimen -Discussed restarting clonidine  patch, TriBenzor, Diamox  as previously prescribed.  Will trial holding off on  spironolactone  for now to see if that helps with the increased urination. BMI 45.0-49.9, adult (HCC) Wegovy  refilled at current dosage, 2.4 mg per week.  A1c unremarkable today. Dislocation of temporomandibular joint, initial encounter Symptoms consistent with TMJ. -Discussed supportive care measures at home including ibuprofen  as needed, ice, exercises.  Handout provided. -If not improving and bothersome to quality of life, discussed referral for surgical options if needed Anxiety Discussed hydroxyzine  as needed up for anxiety which can be used more often than previously prescribed Ativan .  Rx hydroxyzine  10 4 times daily as needed sent.   RTC in 1 week for follow up.   Damien Cassis, MD [facility]

## 2024-01-02 NOTE — Assessment & Plan Note (Signed)
 Discussed hydroxyzine  as needed up for anxiety which can be used more often than previously prescribed Ativan .  Rx hydroxyzine  10 4 times daily as needed sent.

## 2024-01-09 ENCOUNTER — Encounter: Payer: Self-pay | Admitting: Neurology

## 2024-01-09 ENCOUNTER — Telehealth: Payer: Self-pay | Admitting: Neurology

## 2024-01-09 ENCOUNTER — Telehealth (INDEPENDENT_AMBULATORY_CARE_PROVIDER_SITE_OTHER): Admitting: Neurology

## 2024-01-09 DIAGNOSIS — G932 Benign intracranial hypertension: Secondary | ICD-10-CM

## 2024-01-09 DIAGNOSIS — Z982 Presence of cerebrospinal fluid drainage device: Secondary | ICD-10-CM | POA: Diagnosis not present

## 2024-01-09 MED ORDER — ACETAZOLAMIDE 250 MG PO TABS: 1000.0000 mg | ORAL_TABLET | Freq: Two times a day (BID) | ORAL | 4 refills | Status: AC

## 2024-01-09 MED ORDER — TOPIRAMATE 200 MG PO TABS: 200.0000 mg | ORAL_TABLET | Freq: Two times a day (BID) | ORAL | 4 refills | Status: AC

## 2024-01-09 NOTE — Telephone Encounter (Signed)
 ..  Pt understands that although there may be some limitations with this type of visit, we will take all precautions to reduce any security or privacy concerns.  Pt understands that this will be treated like an in office visit and we will file with pt's insurance, and there may be a patient responsible charge related to this service. ? ?

## 2024-01-09 NOTE — Progress Notes (Signed)
 [facility]  PATIENT: Tabitha Holland DOB: 30-Sep-1985  REQUESTING CLINICIAN: Marlee Lynwood NOVAK, MD HISTORY FROM: Patient  REASON FOR VISIT: Headaches    HISTORICAL  CHIEF COMPLAINT:  Chief Complaint  Patient presents with   Headache    IIH Follow up    INTERVAL HISTORY 01/09/2024 Patient presents today for follow-up, last visit was in March, since then she tells me she has been doing well.  She had 1 severe headache in May requiring her to go to the hospital but otherwise will have occasional tension type headaches.  She is compliant with the medication, denies any major side effects.  Tells me that she continues with weight management and has lost 50 pounds so far.  No other complaints or other concerns.   INTERVAL HISTORY 07/03/2023:  Patient presents today for follow-up, last visit was in September, since then she did follow-up with Dr. Lanis and did have her VP shunt completed.  Since the placement of the shunt, she tells me that her headaches are well-controlled, she has been doing well until last week when she had a bad headache but relates it to increase stress.  Overall, she is doing well, she complains of her vision getting worse but is scheduled to see ophthalmologist soon.  She also started Wegovy  for weight loss management.   INTERVAL HISTORY 01/29/2023:  Patient presents today for follow-up, last visit was in January at that time we suspected IIH, sent her for LP with opening pressure of 27.  She was started on Diamox  then Topamax  added.  Patient reports during the past 8 months, she has been in and out of the hospital due to worsening headaches.  She did have a total of 3 LPs.  After each LP, she will have relieved for about 48 hours. Her last hospitalization was on September 9.  After that last visit, she was also recommended to follow-up with neurosurgery for evaluation of possible shunt, she does have an appointment scheduled this Thursday.  She  reports currently she is in pain, her vision is getting worse left worse than right.  Pain is worse with movement.  She mainly sit down or lay down in a dark room.  She is still struggling with her weights feels like it might be related to her thyroid  disease.    HISTORY OF PRESENT ILLNESS:  This is a 38 year old woman past medical history of anxiety, depression, hypertension, obesity, headaches who is presenting with complaint of persistent headaches for the past 9 days.  Patient reports she woke up last Wednesday morning with headaches and the headache did not go away.  On top of the headache she also experienced back pain, visual obscuration and buzzing feeling in the ears.  She also reports pressure in the head.  The pain was so severe that she presented to the ED 3 days ago.  At that time, she did have a head CT which showed no acute abnormality but show evidence of partially empty sella.  She was also given a migraine cocktail which improved her symptoms.  She is still symptomatic, describes a headache at 7 out of 10, pressure squeezing pain behind the left eye which is also throbbing. She has tried Tylenol .  Patient reports she woke up today with loss of vision in the left eye. She did report a history of headaches since the age of 70 intermittent but this is the most severe episode she ever had.  She also feels with this  episode her speech is not coherent she had difficulty formulating the words.   Headache History and Characteristics: Onset: Last Tuesday 05/09/22 Location: Behind left eye  Quality:  Pressure squeezing type pain, thrombing Intensity: 7 /10 currently   Duration: Last  Migrainous Features: Photophobia, nausea, vomiting.  Aura: No  History of brain injury or tumor: No  Family history: Not sure  Motion sickness: no Cardiac history: no  OTC: tylenol , Toradol   Caffeine : Morning coffee  Sleep:  Good  Mood/ Stress:   Prior prophylaxis: Propranolol: No  Verapamil:No TCA:  No Topamax : No Depakote: No Effexor: No Cymbalta: No Neurontin :No  Prior abortives: Triptan: Yes  Anti-emetic: No Steroids: No Ergotamine suppository: No   OTHER MEDICAL CONDITIONS: Obesity, Headaches, Hypertension, anxiety/Depression   REVIEW OF SYSTEMS: Full 14 system review of systems performed and negative with exception of: As noted in the HPI   ALLERGIES: Allergies  Allergen Reactions   Butalbital  Itching   Codeine Other (See Comments)    Migraines    Fish Allergy Itching   Hydrocodone  Other (See Comments)    Migraines    Latex Itching   Percocet [Oxycodone -Acetaminophen ] Other (See Comments)    Migraines    Shellfish Allergy Itching    HOME MEDICATIONS: Outpatient Medications Prior to Visit  Medication Sig Dispense Refill   buPROPion  (WELLBUTRIN  XL) 150 MG 24 hr tablet Take 1 tablet (150 mg total) by mouth daily. 30 tablet 1   cloNIDine  (CATAPRES  - DOSED IN MG/24 HR) 0.1 mg/24hr patch Place 1 patch (0.1 mg total) onto the skin once a week. 4 patch 12   famotidine  (PEPCID ) 20 MG tablet Take 20 mg by mouth daily as needed for heartburn or indigestion. (Patient not taking: Reported on 07/03/2023)     FLUoxetine  (PROZAC ) 40 MG capsule Take 1 capsule (40 mg total) by mouth daily. 30 capsule 0   gabapentin  (GABARONE ) 100 MG tablet Take 1 tablet (100 mg total) by mouth 3 (three) times daily for 14 days. 42 tablet 0   hydrOXYzine  (ATARAX ) 10 MG tablet Take 1 tablet (10 mg total) by mouth every 8 (eight) hours as needed for anxiety. 30 tablet 0   ketorolac  (TORADOL ) 10 MG tablet Take 1 tablet (10 mg total) by mouth every 8 (eight) hours as needed for severe pain (pain score 7-10). 6 tablet 0   LORazepam  (ATIVAN ) 1 MG tablet Take 1 tablet (1 mg total) by mouth 2 (two) times daily as needed for anxiety. TAKE 1 TABLET(1 MG) BY MOUTH TWICE DAILY AS NEEDED FOR ANXIETY OR PANIC Strength: 1 mg 6 tablet 0   methocarbamol  (ROBAXIN ) 500 MG tablet Take 500 mg by mouth every 8 (eight)  hours as needed for muscle spasms.     Olmesartan -amLODIPine -HCTZ 40-5-25 MG TABS Take 1 tablet by mouth daily. 30 tablet 3   ondansetron  (ZOFRAN -ODT) 4 MG disintegrating tablet Take 1 tablet (4 mg total) by mouth every 8 (eight) hours as needed for nausea or vomiting. 20 tablet 0   Ruxolitinib  Phosphate (OPZELURA ) 1.5 % CREA Apply 1 Application topically daily as needed (eczema).     semaglutide -weight management (WEGOVY ) 2.4 MG/0.75ML SOAJ SQ injection Inject 2.4 mg into the skin once a week. 3 mL 2   TYLENOL  500 MG tablet Take 500-1,000 mg by mouth every 6 (six) hours as needed for mild pain (pain score 1-3) or headache.     acetaZOLAMIDE  (DIAMOX ) 250 MG tablet Take 4 tablets (1,000 mg total) by mouth 2 (two) times daily. 240 tablet 2  topiramate  (TOPAMAX ) 200 MG tablet Take 1 tablet (200 mg total) by mouth 2 (two) times daily. 60 tablet 6   Facility-Administered Medications Prior to Visit  Medication Dose Route Frequency Provider Last Rate Last Admin   diclofenac  Sodium (VOLTAREN ) 1 % topical gel 2 g  2 g Topical PRN Sowell, Brandon, MD        PAST MEDICAL HISTORY: Past Medical History:  Diagnosis Date   Anemia    Hx. Not currently (2024)   Anxiety    Complication of anesthesia    High anxiety r/t surgery   Depression    Gallstones    History of anemia    per pt on 02/02/2021   History of suicidal ideation 10/02/2019   Hx of wisdom tooth extraction    Hypertension    IIH (idiopathic intracranial hypertension)    Migraines    Related Intracraniel fluid   Morbid obesity (HCC)     PAST SURGICAL HISTORY: Past Surgical History:  Procedure Laterality Date   APPLICATION OF CRANIAL NAVIGATION  04/17/2023   Procedure: APPLICATION OF CRANIAL NAVIGATION;  Surgeon: Lanis Pupa, MD;  Location: [facility] OR;  Service: Neurosurgery;;   CESAREAN SECTION N/A 10/28/2013   Procedure: CESAREAN SECTION;  Surgeon: Harland JAYSON Birkenhead, MD;  Location: [facility] ORS;  Service: Obstetrics;  Laterality: N/A;    CESAREAN SECTION N/A 09/15/2015   Procedure: CESAREAN SECTION;  Surgeon: Winton Felt, MD;  Location: [facility];  Service: Obstetrics;  Laterality: N/A;   CESAREAN SECTION N/A 05/26/2018   Procedure: CESAREAN SECTION;  Surgeon: Eveline Lynwood MATSU, MD;  Location: [facility]TH[facility] SU[facility];  Service: Obstetrics;  Laterality: N/A;   CHOLECYSTECTOMY     Early 2000s   DILATATION & CURETTAGE/HYSTEROSCOPY WITH MYOSURE N/A 02/09/2021   Procedure: DILATATION & CURETTAGE/HYSTEROSCOPY WITH MYOSURE;  Surgeon: Fredirick Glenys RAMAN, MD;  Location: [facility];  Service: Gynecology;  Laterality: N/A;  fibroid resection   LAPAROSCOPIC REVISION VENTRICULAR-PERITONEAL (V-P) SHUNT  04/17/2023   Procedure: LAPAROSCOPIC VENTRICULAR-PERITONEAL (V-P) SHUNT PLACEMENT;  Surgeon: Rubin Calamity, MD;  Location: [facility] OR;  Service: General;;   VENTRICULOPERITONEAL SHUNT N/A 04/17/2023   Procedure: LAPAROSCOPIC ASSISTED VENTRICULOPERITONEAL SHUNT PLACEMENT;  Surgeon: Lanis Pupa, MD;  Location: [facility] OR;  Service: Neurosurgery;  Laterality: N/A;  51M   WISDOM TOOTH EXTRACTION      FAMILY HISTORY: Family History  Adopted: Yes  Problem Relation Age of Onset   Hypertension Sister    Bipolar disorder Brother    Schizophrenia Brother     SOCIAL HISTORY: Social History   Socioeconomic History   Marital status: Media planner    Spouse name: Not on file   Number of children: 3   Years of education: Not on file   Highest education level: Not on file  Occupational History   Occupation: Artist  Tobacco Use   Smoking status: Every Day    Current packs/day: 0.25    Types: Cigarettes   Smokeless tobacco: Never   Tobacco comments:    3 cigarettes per day as of 04/11/2023  Vaping Use   Vaping status: Never Used  Substance and Sexual Activity   Alcohol use: Not Currently    Comment: Rarely   Drug use: Not Currently    Types: Marijuana   Sexual activity: Not Currently    Birth  control/protection: None  Other Topics Concern   Not on file  Social History Narrative   Not on file   Social Drivers of Corporate investment banker  Strain: Not on file  Food Insecurity: No Food Insecurity (04/17/2023)   Hunger Vital Sign    Worried About Running Out of Food in the Last Year: Never true    Ran Out of Food in the Last Year: Never true  Transportation Needs: No Transportation Needs (04/17/2023)   PRAPARE - Administrator, Civil Service (Medical): No    Lack of Transportation (Non-Medical): No  Physical Activity: Not on file  Stress: Not on file  Social Connections: Not on file  Intimate Partner Violence: Not At Risk (04/17/2023)   Humiliation, Afraid, Rape, and Kick questionnaire    Fear of Current or Ex-Partner: No    Emotionally Abused: No    Physically Abused: No    Sexually Abused: No    PHYSICAL EXAM  GENERAL EXAM/CONSTITUTIONAL: Vitals:  There were no vitals filed for this visit.  There is no height or weight on file to calculate BMI. Wt Readings from Last 3 Encounters:  01/02/24 (!) 329 lb (149.2 kg)  12/10/23 (!) 330 lb (149.7 kg)  11/12/23 (!) 340 lb (154.2 kg)   Patient is in no distress; well developed, nourished and groomed; neck is supple, sitting in a dark room  MUSCULOSKELETAL: Gait, strength, tone, movements noted in Neurologic exam below  NEUROLOGIC: MENTAL STATUS:      No data to display         awake, alert, oriented to person, place and time recent and remote memory intact normal attention and concentration language fluent, comprehension intact, naming intact fund of knowledge appropriate  DIAGNOSTIC DATA (LABS, IMAGING, TESTING) - I reviewed patient records, labs, notes, testing and imaging myself where available.  Lab Results  Component Value Date   WBC 7.7 12/10/2023   HGB 11.1 (L) 12/10/2023   HCT 34.1 (L) 12/10/2023   MCV 85.3 12/10/2023   PLT 204 12/10/2023      Component Value Date/Time   NA  138 12/10/2023 2108   NA 139 09/11/2023 1103   K 3.1 (L) 12/10/2023 2108   CL 105 12/10/2023 2108   CO2 24 12/10/2023 2108   GLUCOSE 85 12/10/2023 2108   BUN 6 12/10/2023 2108   BUN 11 09/11/2023 1103   CREATININE 0.70 12/10/2023 2108   CREATININE 0.54 07/18/2013 0924   CALCIUM  8.8 (L) 12/10/2023 2108   PROT 6.8 12/10/2023 2108   PROT 7.5 09/11/2023 1103   ALBUMIN  3.5 12/10/2023 2108   ALBUMIN  4.4 09/11/2023 1103   AST 13 (L) 12/10/2023 2108   ALT 9 12/10/2023 2108   ALKPHOS 65 12/10/2023 2108   BILITOT 0.8 12/10/2023 2108   BILITOT 0.5 09/11/2023 1103   GFRNONAA >60 12/10/2023 2108   GFRAA >60 10/02/2019 1645   Lab Results  Component Value Date   CHOL 139 08/03/2020   HDL 55 08/03/2020   LDLCALC 72 08/03/2020   TRIG 58 08/03/2020   CHOLHDL 2.5 08/03/2020   Lab Results  Component Value Date   HGBA1C 5.1 01/02/2024   No results found for: VITAMINB12 Lab Results  Component Value Date   TSH 1.730 02/23/2023    Head CT 05/15/2022 1. No evidence of acute intracranial abnormality. 2. Partially empty sella, often an incidental finding though can be seen within idiopathic intracranial hypertension.   MRI Brain/MRV Head 12/27/2022 1. No evidence of acute intracranial abnormality. 2. Partially empty sella, narrowed left transverse sinus and mildly prominent Meckel's caves, which is nonspecific but can be seen with idiopathic intracranial hypertension. 3. No evidence of dural  venous sinus thrombosis.   ASSESSMENT AND PLAN  38 y.o. year old female with history of obesity, hypertension, IIH s/p VP shunt, anxiety/depression who is presenting for follow-up for IIH.  She is currently doing very well after the placement of the VP shunt, still on Diamox  1000 mg twice daily and topiramate  200 mg twice daily.  Plan will be for patient to continue Diamox  and topiramate  and I told her to continue her weight management.  So far she has lost 50 pounds.  Will continue to follow-up on a  yearly basis.  Patient understands to contact us  sooner if her headaches to get worse.    1. IIH (idiopathic intracranial hypertension)   2. S/P VP shunt      Patient Instructions  Continue Diamox  1000 mg twice daily Continue topiramate  200 mg twice daily Continue with weight management Continue follow-up with PCP and return in 1 year or sooner if worse     No orders of the defined types were placed in this encounter.   Meds ordered this encounter  Medications   acetaZOLAMIDE  (DIAMOX ) 250 MG tablet    Sig: Take 4 tablets (1,000 mg total) by mouth 2 (two) times daily.    Dispense:  720 tablet    Refill:  4   topiramate  (TOPAMAX ) 200 MG tablet    Sig: Take 1 tablet (200 mg total) by mouth 2 (two) times daily.    Dispense:  180 tablet    Refill:  4    Return in about 1 year (around 01/08/2025).  Virtual Visit via Video Note  I connected with  Tabitha Holland on 01/09/24 at 11:45 AM EDT by a video enabled telemedicine application and verified that I am speaking with the correct person using two identifiers.  Location: Patient: Home  Provider: [facility] Office   I discussed the limitations of evaluation and management by telemedicine and the availability of in person appointments. The patient expressed understanding and agreed to proceed.   I discussed the assessment and treatment plan with the patient. The patient was provided an opportunity to ask questions and all were answered. The patient agreed with the plan and demonstrated an understanding of the instructions.   The patient was advised to call back or seek an in-person evaluation if the symptoms worsen or if the condition fails to improve as anticipated.  I provided 10 minutes of non-face-to-face time during this encounter.  I have spent a total of 30 minutes dedicated to this patient today, preparing to see patient, performing a medically appropriate examination and evaluation, ordering tests and/or medications and  procedures, and counseling and educating the patient/family/caregiver; independently interpreting result and communicating results to the family/patient/caregiver; and documenting clinical information in the electronic medical record.    Pastor Falling, MD 01/09/2024, 12:09 PM  [facility] [address] [place], [place] [address] [phone]   Pastor Falling, MD 01/09/2024, 12:07 PM  [facility] [address], [address] [place], [place] [address] [phone]

## 2024-01-09 NOTE — Patient Instructions (Signed)
 Continue Diamox  1000 mg twice daily Continue topiramate  200 mg twice daily Continue with weight management Continue follow-up with PCP and return in 1 year or sooner if worse

## 2024-01-11 ENCOUNTER — Ambulatory Visit: Admitting: Family Medicine

## 2024-01-14 IMAGING — DX DG CHEST 2V
2 series · 2 of 2 positions shown · non-contrast
Comparison: 11/11/2020 chest radiograph.

CLINICAL DATA: Chest pain

EXAM:
CHEST - 2 VIEW

[chest pa]
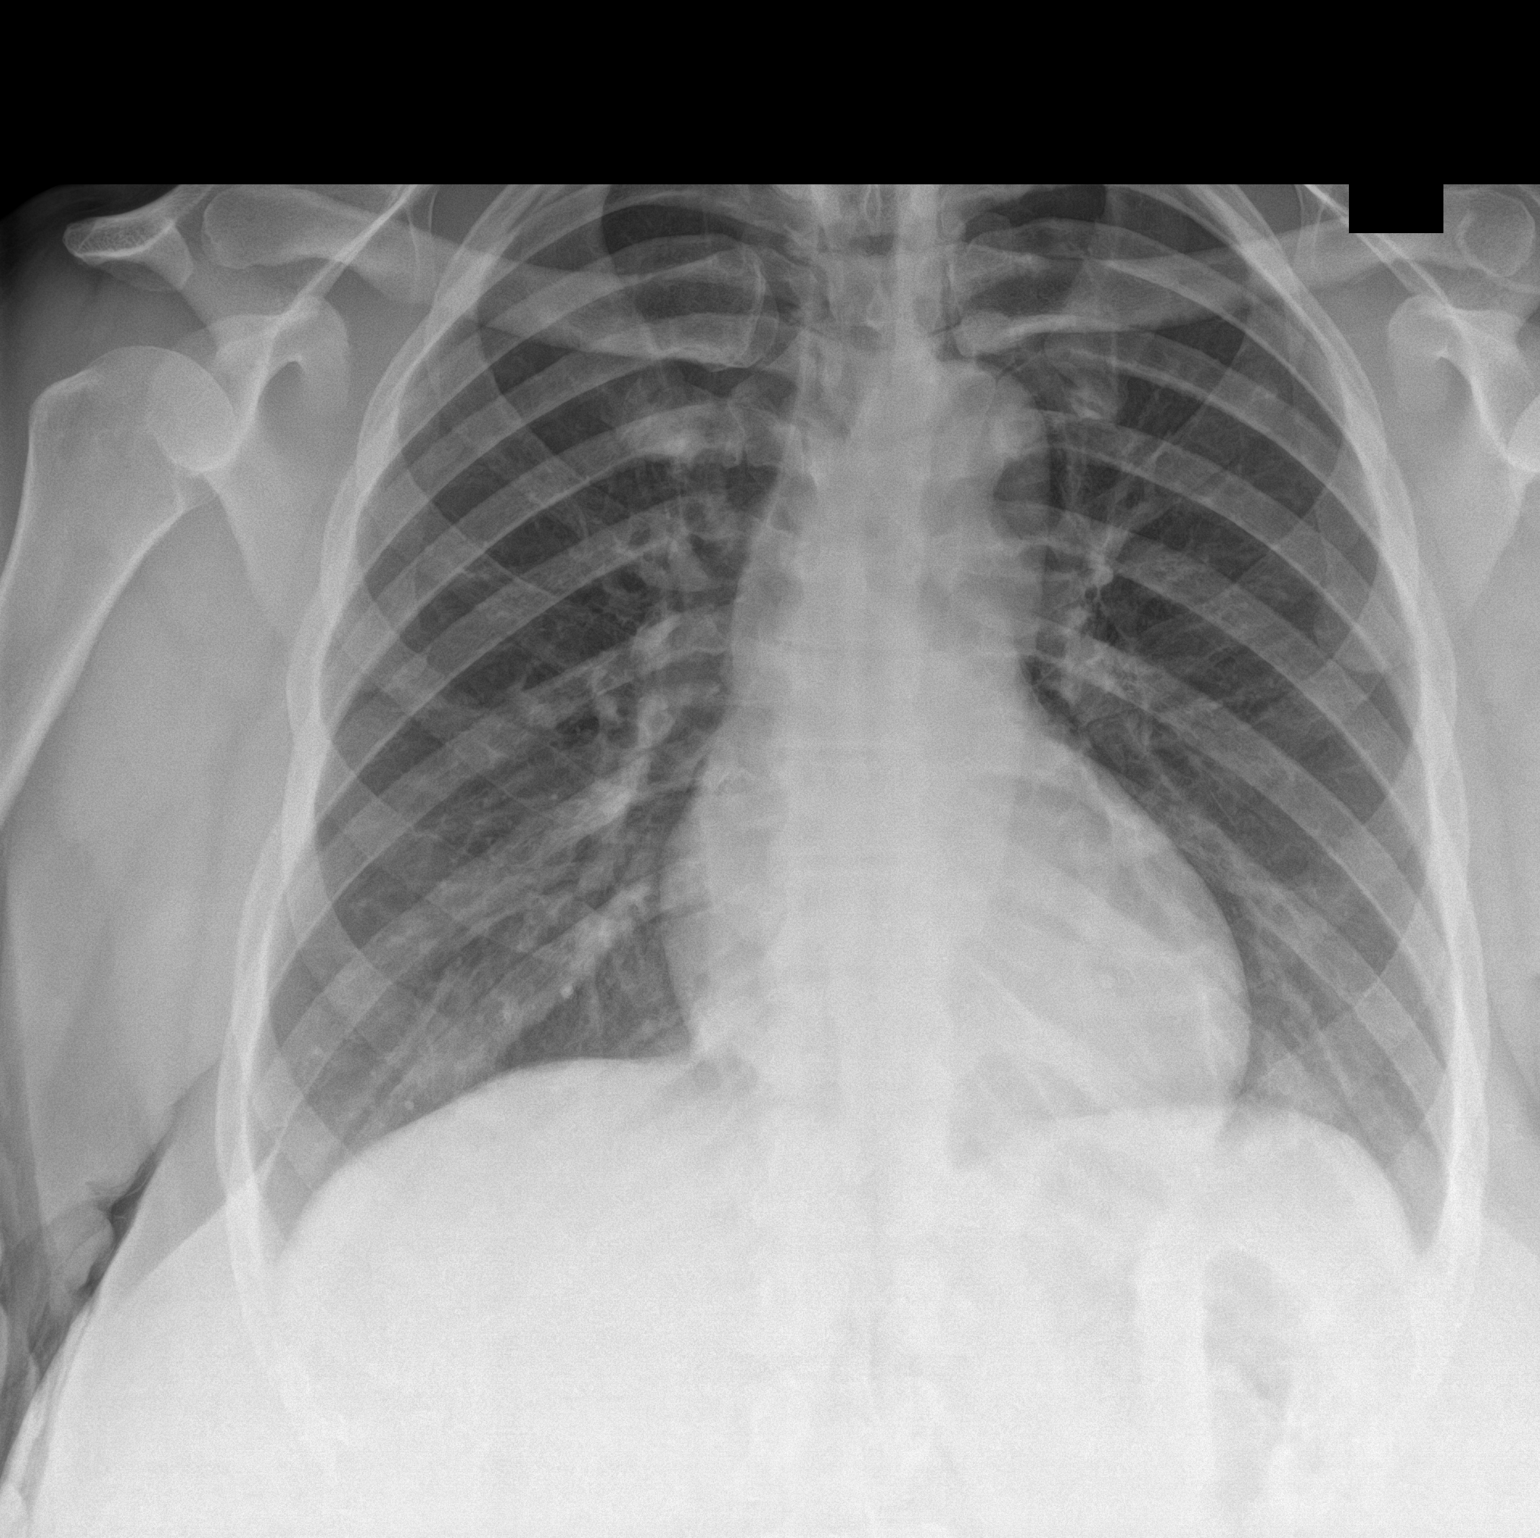

[chest lat]
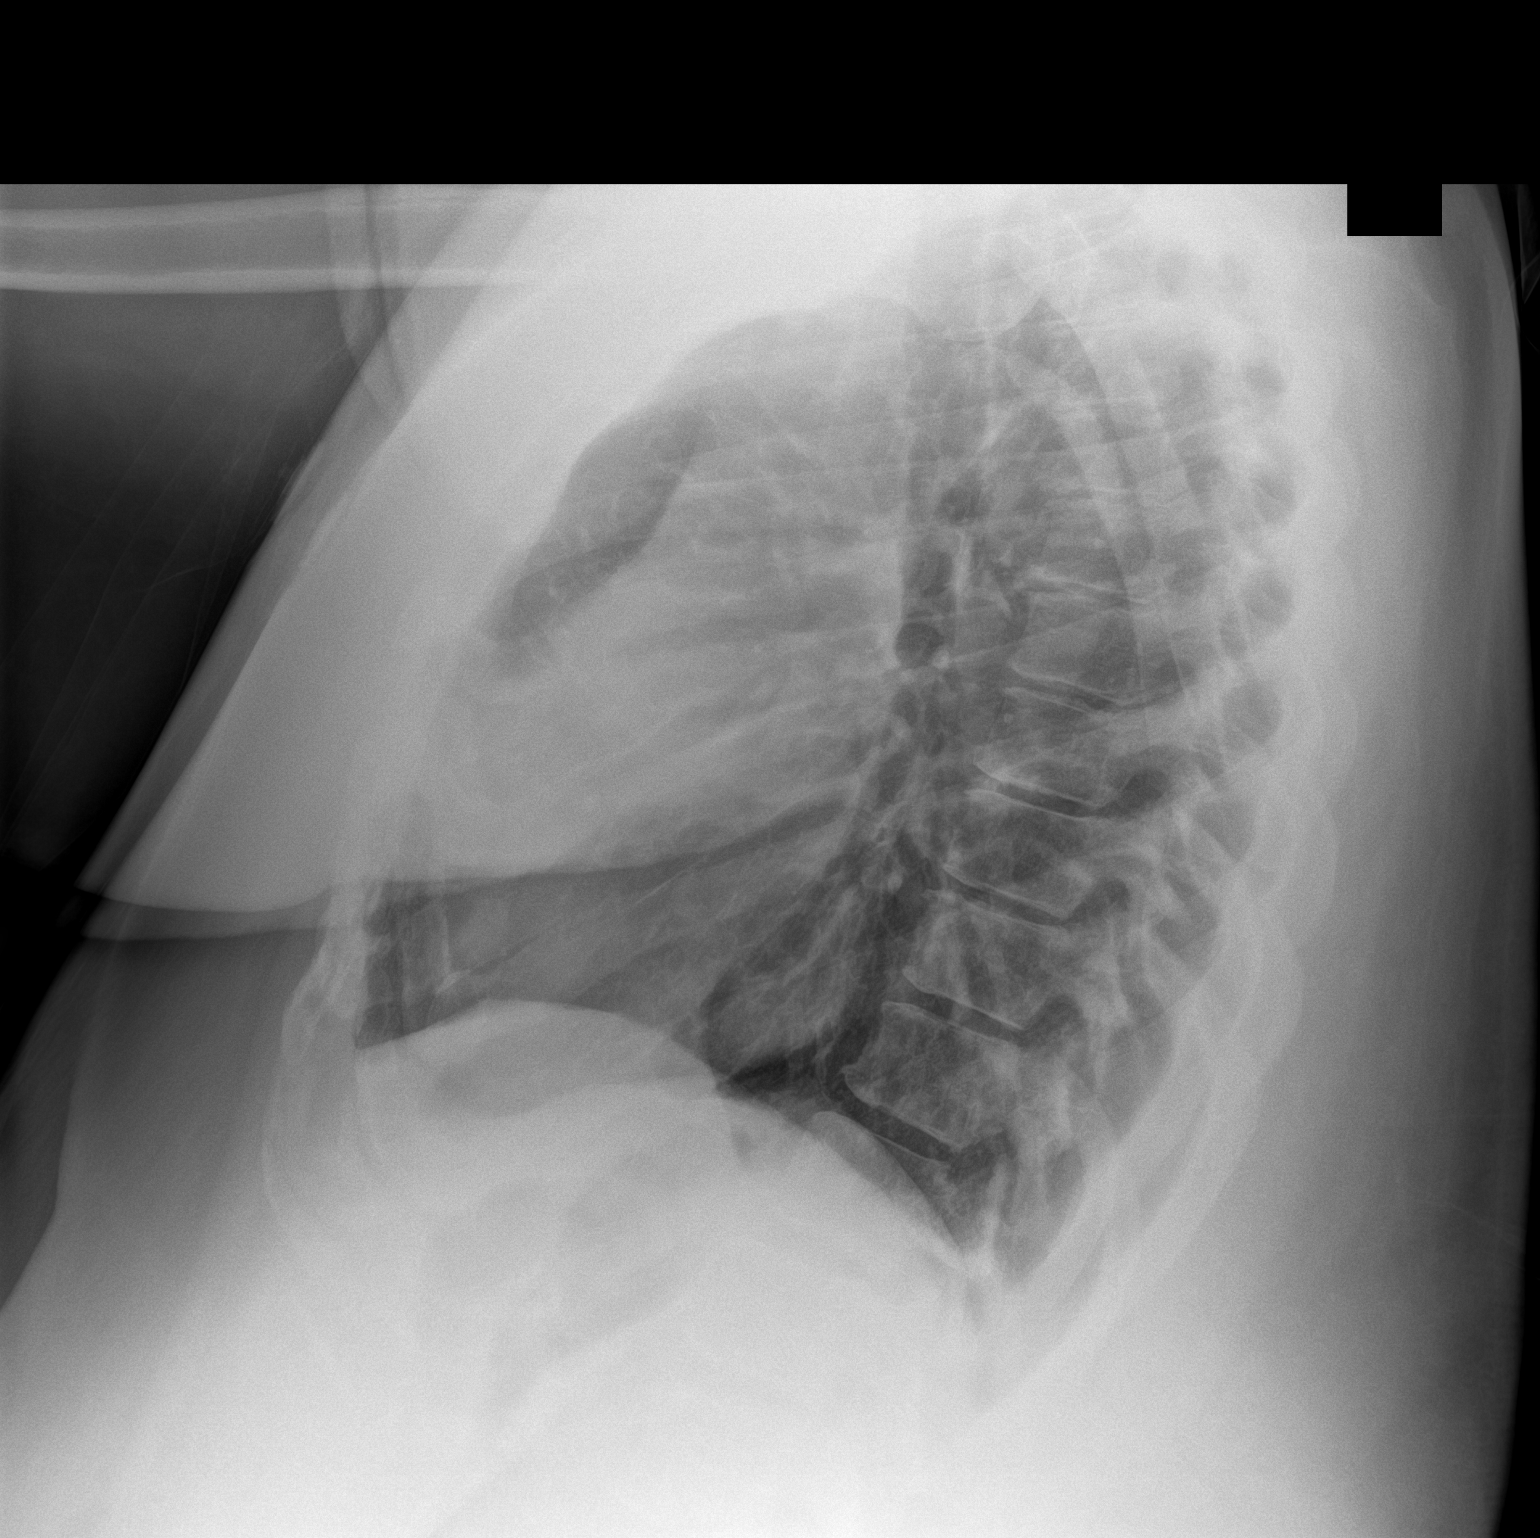

[2 of 2 positions shown; findings below may reference images not displayed]

FINDINGS: Stable cardiomediastinal silhouette with normal heart size. No
pneumothorax. No pleural effusion. Lungs appear clear, with no acute
consolidative airspace disease and no pulmonary edema.
IMPRESSION: No active cardiopulmonary disease.

## 2024-01-14 IMAGING — US US EXTREM LOW VENOUS*L*
1 series · 14 of 24 positions shown · non-contrast
Comparison: None.

CLINICAL DATA: Left popliteal fossa pain

EXAM:
LEFT LOWER EXTREMITY VENOUS DOPPLER ULTRASOUND
TECHNIQUE: Gray-scale sonography with compression, as well as color and duplex
ultrasound, were performed to evaluate the deep venous system(s)
from the level of the common femoral vein through the popliteal and
proximal calf veins.

[Series 1: us venous img lower uni left (dvt) · portal-venous · 62 acquisitions, 14 frames shown]
[im 1/62]
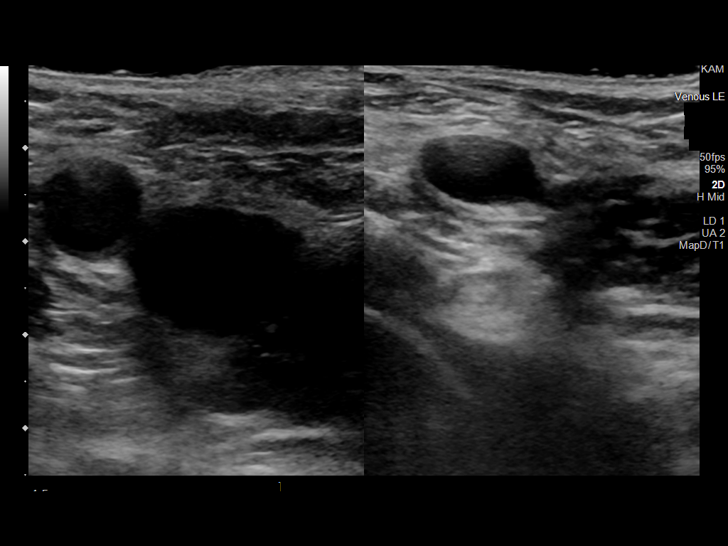
[im 6/62]
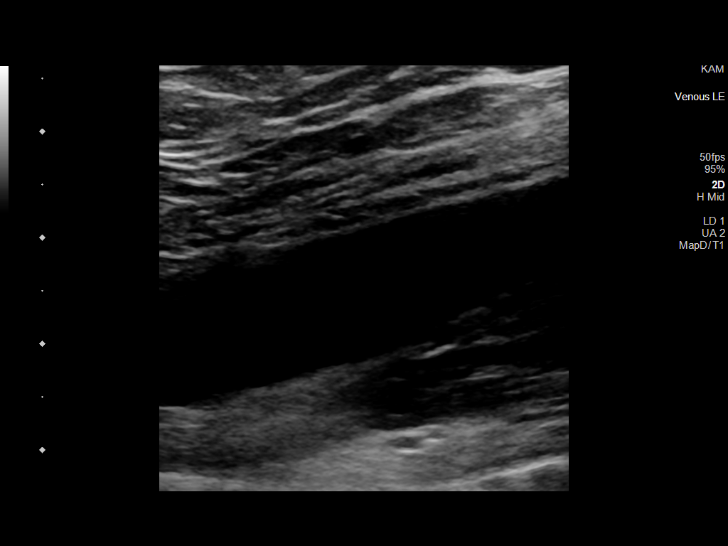
[im 11/62]
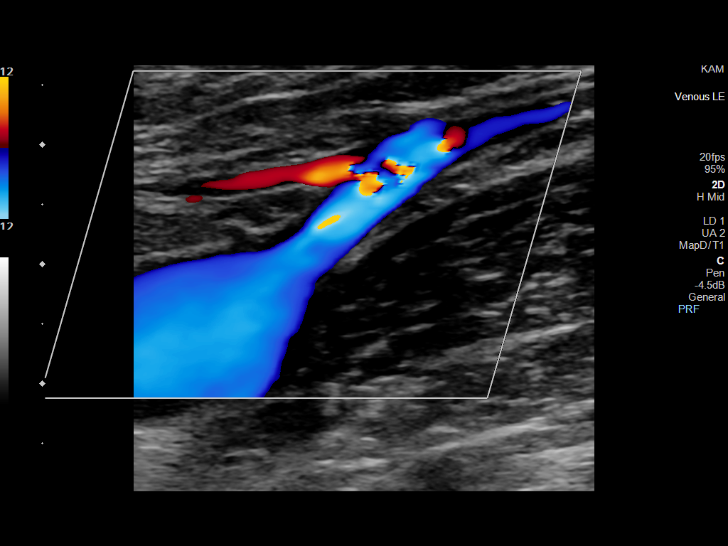
[im 19/62]
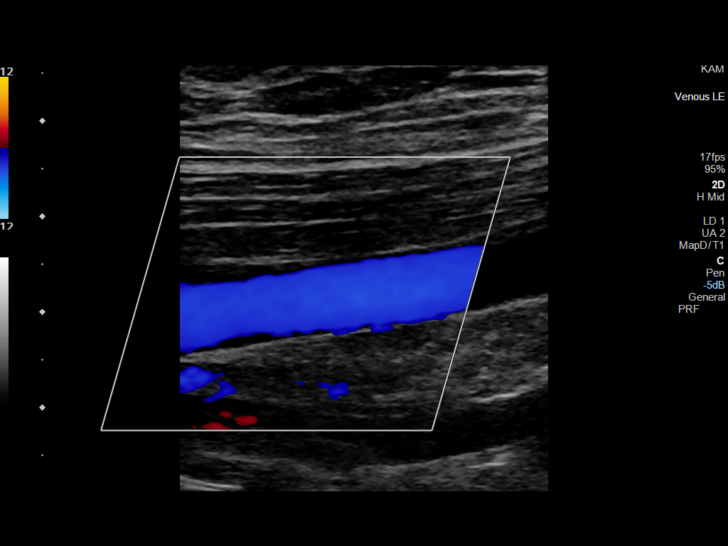
[im 22/62]
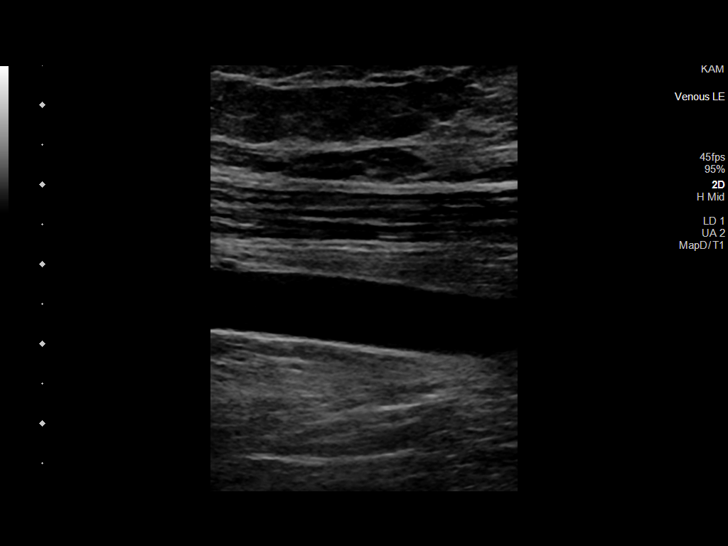
[im 27/62]
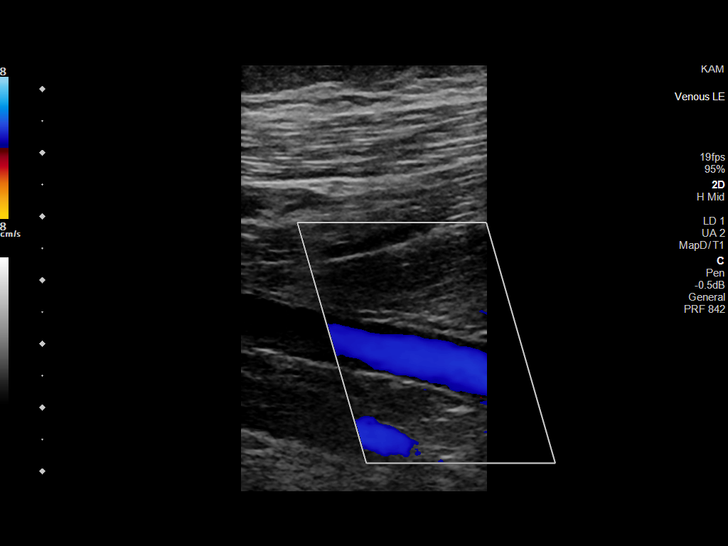
[im 32/62]
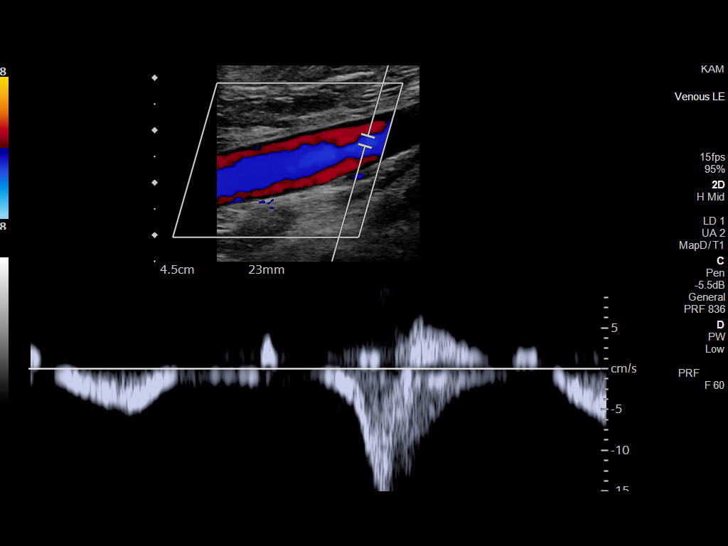
[im 35/62]
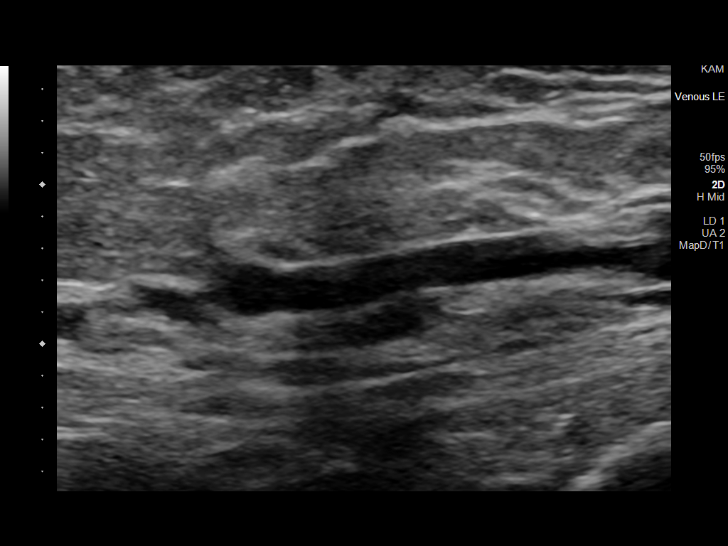
[im 40/62]
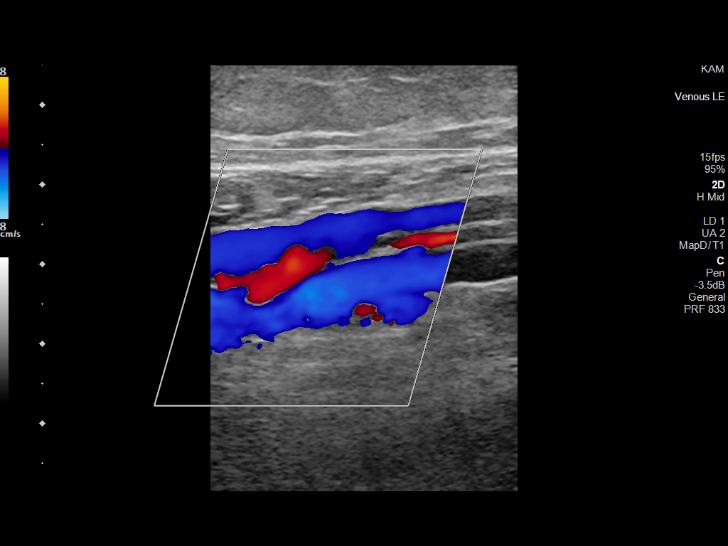
[im 46/62]
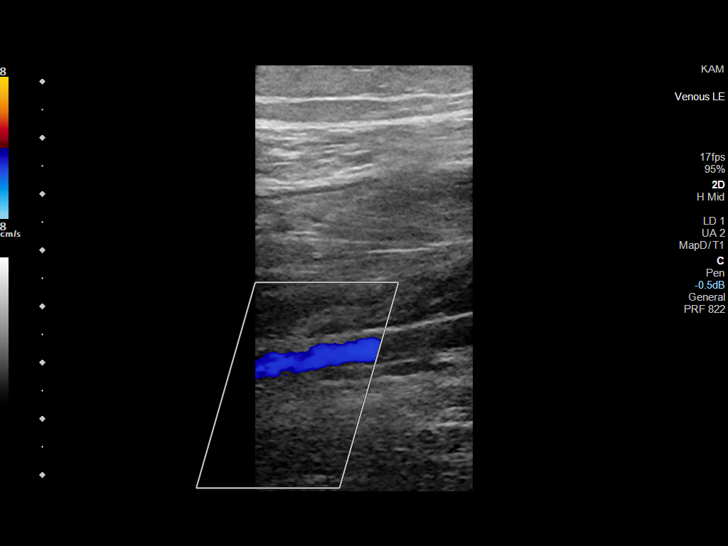
[im 54/62]
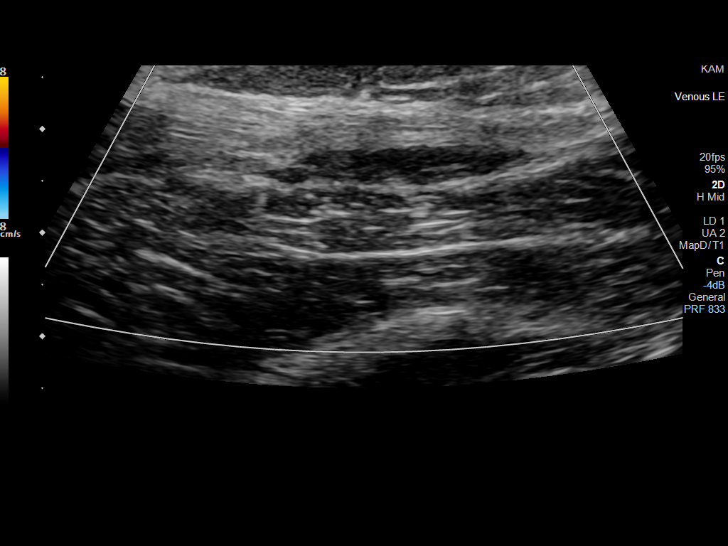
[im 56/62]
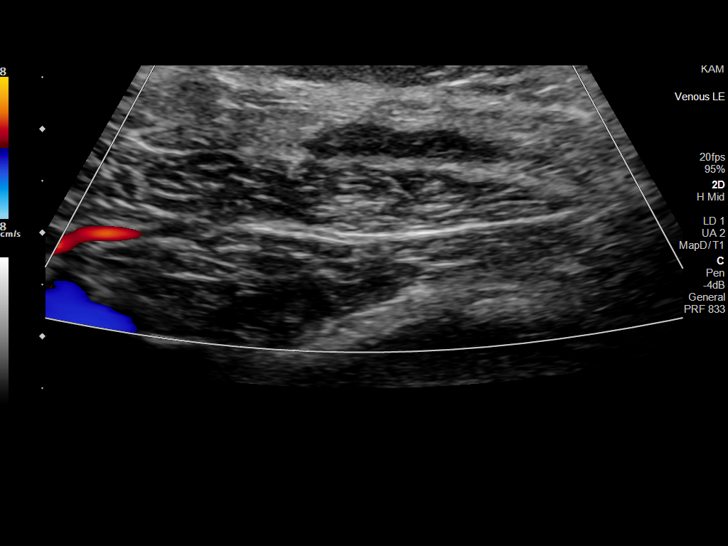
[im 59/62]
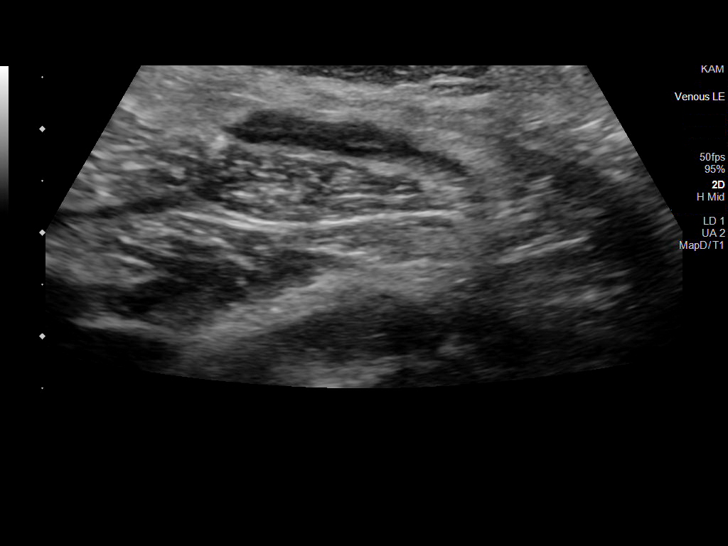
[im 62/62]
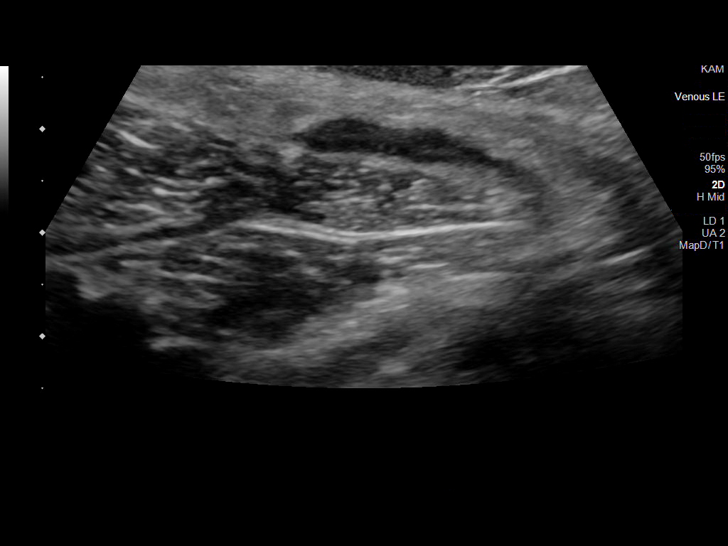

[14 of 24 positions shown; findings below may reference images not displayed]

FINDINGS: VENOUS

Normal compressibility of the common femoral, superficial femoral,
and popliteal veins, as well as the visualized calf veins.
Visualized portions of profunda femoral vein and great saphenous
vein unremarkable. No filling defects to suggest DVT on grayscale or
color Doppler imaging. Doppler waveforms show normal direction of
venous flow, normal respiratory plasticity and response to
augmentation.

Limited views of the contralateral common femoral vein are
unremarkable.

OTHER

Trace ovoid heterogeneous collection in the popliteal fossa measures
2.9 x 0.4 x 1.1 cm. Findings are most suggestive of a collapsed
Baker's cyst.

Limitations: none
IMPRESSION: 1. Negative for deep venous thrombosis.
2. Probable small collapsed Baker's cyst.

## 2024-02-07 ENCOUNTER — Other Ambulatory Visit (HOSPITAL_COMMUNITY): Payer: Self-pay

## 2024-02-07 ENCOUNTER — Telehealth: Payer: Self-pay

## 2024-02-07 NOTE — Telephone Encounter (Signed)

## 2024-02-13 ENCOUNTER — Other Ambulatory Visit: Payer: Self-pay | Admitting: Family Medicine

## 2024-02-13 NOTE — Telephone Encounter (Signed)
 Chart reviewed. Rx refilled. Requesting patient fu.

## 2024-03-24 ENCOUNTER — Encounter: Payer: Self-pay | Admitting: Family Medicine

## 2024-03-24 ENCOUNTER — Ambulatory Visit: Admitting: Family Medicine

## 2024-03-24 VITALS — BP 177/103 | HR 68 | Ht 71.0 in | Wt 316.8 lb

## 2024-03-24 DIAGNOSIS — Z23 Encounter for immunization: Secondary | ICD-10-CM | POA: Diagnosis not present

## 2024-03-24 DIAGNOSIS — F32A Depression, unspecified: Secondary | ICD-10-CM

## 2024-03-24 DIAGNOSIS — F419 Anxiety disorder, unspecified: Secondary | ICD-10-CM | POA: Diagnosis not present

## 2024-03-24 DIAGNOSIS — M5432 Sciatica, left side: Secondary | ICD-10-CM | POA: Diagnosis not present

## 2024-03-24 DIAGNOSIS — F4321 Adjustment disorder with depressed mood: Secondary | ICD-10-CM | POA: Diagnosis present

## 2024-03-24 DIAGNOSIS — I10 Essential (primary) hypertension: Secondary | ICD-10-CM | POA: Diagnosis not present

## 2024-03-24 MED ORDER — GABAPENTIN 100 MG PO TABS: 200.0000 mg | ORAL_TABLET | Freq: Three times a day (TID) | ORAL | Status: AC

## 2024-03-24 MED ORDER — HYDROXYZINE HCL 10 MG PO TABS: 10.0000 mg | ORAL_TABLET | Freq: Three times a day (TID) | ORAL | 0 refills | Status: DC | PRN

## 2024-03-24 MED ORDER — BUPROPION HCL ER (XL) 150 MG PO TB24
150.0000 mg | ORAL_TABLET | Freq: Every day | ORAL | 1 refills | Status: AC
Start: 2024-03-24 — End: ?

## 2024-03-24 MED ORDER — FLUOXETINE HCL 40 MG PO CAPS: 40.0000 mg | ORAL_CAPSULE | Freq: Every day | ORAL | 1 refills | Status: AC

## 2024-03-24 NOTE — Assessment & Plan Note (Signed)
 Suspect appropriate course of grief approximately 3 weeks after passing of brother.  Do not wish to change medication significant at this time, but would favor providing support to patient with therapy.  Provided reassurance to patient. - Provided therapy resources and emphasized importance of following up as well as [facility] grief counseling phone number - Continue fluoxetine  40 mg daily and bupropion  150 mg daily - Increase hydroxyzine  to 10 mg 3 times daily as needed for acute anxiety

## 2024-03-24 NOTE — Assessment & Plan Note (Signed)
 Poorly controlled today, has significant psychological stressors and missing doses of medication.  Will not adjust medication today but will follow closely. - Continue Tribenzor at current dose - Consider microalbumin/creatinine ratio at next visit to evaluate for early proteinuria given persistent hypertension

## 2024-03-24 NOTE — Progress Notes (Deleted)
    SUBJECTIVE:   CHIEF COMPLAINT / HPI:   HTN Olmesartan  amlodipine  hydrochlorothiazide  40-5-25 Clonidine  patch weekly  CP, SOB, VC, HA, extremity swelling?    PERTINENT  PMH / PSH: ***  OBJECTIVE:   There were no vitals taken for this visit.  ***  ASSESSMENT/PLAN:   Assessment & Plan      Damien Cassis, MD [facility]

## 2024-03-24 NOTE — Patient Instructions (Addendum)
 It was great to see you today! Thank you for choosing [facility] for your primary care.  Today we addressed:  Anxiety You can take your hydroxyzine  10 mg up to 3 times daily if needed. We will keep your other medications the same for now, please try not to miss any doses if possible. I believe you are going through a normal grief and adjustment reaction period.  He will keep following with you closely and adjust your medications in the future depending on how you are doing. Please consider seeking out therapy, resources listed below.  Sciatica I am increasing your gabapentin  to 200 mg three times daily from 100 mg.  We could increase this further to 300 mg in the future.  Please be aware that the hydroxyzine  and gabapentin  together can make you drowsy, please be cautious driving and see how the medication affects you before you get behind the wheel.  Blood pressure Your blood pressure is high today.  I would not adjust your medication at this time given that you have missed some doses recently.  Do your best not to miss any doses and we will recheck your blood pressure at your next visit in a couple weeks.   Therapy and Counseling Resources Most providers on this list will take Medicaid. Patients with commercial insurance or Medicare should contact their insurance company to get a list of in network providers.  [facility] is an organization in the area that offers grief counseling.  You can call them at [phone].  [facility] (takes children) Location 1: [address] [place], [place] [address] Location 2: [address] [place], [place] [address] [phone]   [facility] (spanish speaking therapist available)(habla espanol)(take medicare and medicaid)  [address], [place], [place] [address], [place]  [email or web address] [phone]  [facility] [address] [facility] [address] [place], [place]  [address] [phone]  [facility]   [address], [address] [place], [place] [address]      [phone]  [facility] (spanish available) [address]  [place], [place] [address] [phone]  [facility] (take [facility] and medicare) [address], [address]  [place], [place] [address]       [phone]     [facility] (virtual only) [phone]  [facility] [address] [place], [place] [phone]  [facility]:  [address] [place] [phone]  [facility]:  [address] [place] [phone]  [facility] (under & uninsured) [address]   [place] [phone]    [email or web address]    [facility] [address]  [place]    [phone]  [facility] -[address]     Phone:  [phone]     [facility]-  [address]  [phone]   [facility] [address]      [place], [place] [phone]  [facility]: [address] [place], [place], [place]  [phone]   [facility] (Spanish therapist) [email or web address]  [address] [place]  [address]   [place] [address]    [phone]    [facility]   [address],  [place], [place]  [phone]   [facility]  [address] [place]  [phone]  [facility]  [address] [place], [place]        [phone]  Open Access/Walk In Clinic under & uninsured  [facility]  [address] [place], [place] [phone] Crisis [phone]  [facility] [address],  (Spanish)   [address] , [place]: [phone]) 8:30 - 12; 1 - 2:30  [facility] [employer],  [address] [place], [place]    [phone]):8:30 -  12; 2 - 3PM  [facility],  [address],  [place]; [phone]):   Mon - Fri 8 AM - 5 PM  [facility] [address] [place]  MWF 12:30 to 3:00 or call to schedule an appointment  [phone]  Specific Provider options Psychology Today  [email or web address]/us  click on find a therapist  enter your zip code left side and select or tailor a therapist for your specific need.   [facility] Provider Directory [email or web address]  (Medicaid)   Follow all drop down to find a provider  Social Support program [facility] [place] [phone] or photosolver.pl [address], [place], [place] Recovery support and educational   24- Hour Availability:   [facility]  [address] [place], [place] [phone] Crisis [phone]  [facility] [phone]  [place] [facility]  [phone]   [facility]  [phone] (after hours)  [facility]/Mobile Crisis   [phone]  [place]  National Suicide Hotline  (940) 707-8904 [place])  Call 911 or go to emergency room  [facility]  [phone]);  [place] and [place]  [phone]); [place], [place], [place], [place], [place], [place], [place]   You should return to our clinic in 2-4 weeks to check in again.  Thank you for coming to see us  at [facility] and for the opportunity to care for you! Toma, Jilliam [place], MD 03/24/2024, 3:09 PM

## 2024-03-24 NOTE — Progress Notes (Signed)
 SUBJECTIVE:   CHIEF COMPLAINT / HPI:  Tabitha Holland is a 38 yo female with pertinent PMH of IIH, GAD, MDD, and HTN presenting for anxiety, sciatica, and HTN follow up.  Hypertension - BP: (!) 177/103 today. - Home medications include: Olmesartan -amlodipine -hydrochlorothiazide  40-5-25, missed all doses last week, did take meds today.  Discontinued Clonidine  patch weekly as of September. - She does not endorse taking these medications as prescribed. - Does not check blood pressure at home. - Denies hypotension symptoms including dizziness and orthostatic symptoms. - Denies headache, vision changes, LUQ pain, hematuria, chest pain.  Anxiety Patient's brother was killed a few weeks ago on the highway in an incident. Endorses nightmares since loss of brother. Has been having trouble sleeping and reports her appetite is poor. Has been more withdrawn and tearful. Energy level is okay, patient remains functional at work, as a wife, and as a mother. Patient is taking hydroxyzine  10 mg, taking once daily. Patient is taking fluoxetine  40 mg daily. Also taking Buproprion 150 mg. Patient is looking into getting a therapist.  Hip pain, sciatica Patient reports shooting hip pain for the past 3-4 days after a lot of lifting and bending at work. She describes this as pain radiating down her leg. Does have a history of sciatica, but reports this is different from prior sciatic pain. Patient denies weakness in that leg.  She has been having to drag it instead of lifting all the way due to the pain. Patient is taking gabapentin  100 mg TID without much improvement.  PERTINENT  PMH / PSH: GAD, MDD HTN IIH s/p multiple lumbar punctures   OBJECTIVE:   BP (!) 177/103   Pulse 68   Ht 5' 11 (1.803 m)   Wt (!) 316 lb 12.8 oz (143.7 kg)   LMP 02/29/2024   SpO2 99%   BMI 44.18 kg/m    General: Age-appropriate, resting comfortably in chair, NAD, alert and at baseline. Cardiovascular:  Regular rate and rhythm. Normal S1/S2. No murmurs, rubs, or gallops appreciated. 2+ radial pulses. Pulmonary: Clear bilaterally to ascultation. No wheezes, crackles, or rhonchi. No increased WOB, no accessory muscle usage on room air. Skin: Warm and dry.  Few scattered ecchymoses across extremities, reportedly from work. Extremities: No peripheral edema bilaterally. Capillary refill <2 seconds. MSK: Positive L straight leg raise test.  Mildly antalgic gait, favors right. Psych: Pleasant, appropriate, full range affect, briefly tearful.  Denies SI/HI.   ASSESSMENT/PLAN:   Assessment & Plan Grief Depression, unspecified depression type Anxiety Suspect appropriate course of grief approximately 3 weeks after passing of brother.  Do not wish to change medication significant at this time, but would favor providing support to patient with therapy.  Provided reassurance to patient. - Provided therapy resources and emphasized importance of following up as well as [facility] grief counseling phone number - Continue fluoxetine  40 mg daily and bupropion  150 mg daily - Increase hydroxyzine  to 10 mg 3 times daily as needed for acute anxiety Sciatica of left side Exam consistent with known sciatica. - Increase gabapentin  to 200 mg 3 times daily, can increase to 300 mg if not helping - Continue acetaminophen  Q6h PRN - Discussed physical therapy, deferred at this time but can reevaluate at future visit - Return sooner if worsening acutely, can refer to PM&R for evaluation for injection Primary hypertension Poorly controlled today, has significant psychological stressors and missing doses of medication.  Will not adjust medication today but will follow closely. - Continue Tribenzor at  current dose - Consider microalbumin/creatinine ratio at next visit to evaluate for early proteinuria given persistent hypertension Encounter for immunization - Influenza vaccine administered   Follow-up in 2-4 weeks to  check-in.  Curstin Schmale Toma, MD PGY-2, [facility] 03/24/24, 3:24 PM

## 2024-03-24 NOTE — Assessment & Plan Note (Signed)
 Exam consistent with known sciatica. - Increase gabapentin  to 200 mg 3 times daily, can increase to 300 mg if not helping - Continue acetaminophen  Q6h PRN - Discussed physical therapy, deferred at this time but can reevaluate at future visit - Return sooner if worsening acutely, can refer to PM&R for evaluation for injection

## 2024-04-21 ENCOUNTER — Ambulatory Visit: Admitting: Family Medicine

## 2024-04-21 NOTE — Progress Notes (Deleted)
" ° ° °  SUBJECTIVE:   CHIEF COMPLAINT / HPI:   Grief  Htn  Sciatica Increased gabapentin  recently   PERTINENT  PMH / PSH: ***  OBJECTIVE:   LMP 02/29/2024   ***  ASSESSMENT/PLAN:   Assessment & Plan      Damien Cassis, MD [facility]  "

## 2024-04-30 ENCOUNTER — Other Ambulatory Visit: Payer: Self-pay | Admitting: Family Medicine

## 2024-04-30 DIAGNOSIS — F419 Anxiety disorder, unspecified: Secondary | ICD-10-CM

## 2024-04-30 NOTE — Telephone Encounter (Signed)
 Chart reviewed. Rx refilled.

## 2025-01-08 ENCOUNTER — Ambulatory Visit: Admitting: Neurology
